# Patient Record
Sex: Female | Born: 1955 | Race: White | Hispanic: No | Marital: Married | Smoking: Former smoker
Health system: Southern US, Community
[De-identification: ages and names within clinical notes are randomized; demographics above are authoritative.]

## PROBLEM LIST (undated history)

## (undated) DIAGNOSIS — Z22322 Carrier or suspected carrier of Methicillin resistant Staphylococcus aureus: Secondary | ICD-10-CM

## (undated) DIAGNOSIS — Z79811 Long term (current) use of aromatase inhibitors: Secondary | ICD-10-CM

## (undated) DIAGNOSIS — J449 Chronic obstructive pulmonary disease, unspecified: Secondary | ICD-10-CM

## (undated) DIAGNOSIS — C7951 Secondary malignant neoplasm of bone: Secondary | ICD-10-CM

## (undated) DIAGNOSIS — Z720 Tobacco use: Secondary | ICD-10-CM

## (undated) DIAGNOSIS — Z9221 Personal history of antineoplastic chemotherapy: Secondary | ICD-10-CM

## (undated) DIAGNOSIS — N6452 Nipple discharge: Secondary | ICD-10-CM

## (undated) DIAGNOSIS — Z923 Personal history of irradiation: Secondary | ICD-10-CM

## (undated) DIAGNOSIS — C50919 Malignant neoplasm of unspecified site of unspecified female breast: Secondary | ICD-10-CM

## (undated) DIAGNOSIS — Z803 Family history of malignant neoplasm of breast: Secondary | ICD-10-CM

## (undated) DIAGNOSIS — G629 Polyneuropathy, unspecified: Secondary | ICD-10-CM

## (undated) DIAGNOSIS — E785 Hyperlipidemia, unspecified: Secondary | ICD-10-CM

## (undated) DIAGNOSIS — Z972 Presence of dental prosthetic device (complete) (partial): Secondary | ICD-10-CM

## (undated) DIAGNOSIS — F419 Anxiety disorder, unspecified: Secondary | ICD-10-CM

## (undated) DIAGNOSIS — I10 Essential (primary) hypertension: Secondary | ICD-10-CM

## (undated) DIAGNOSIS — C50119 Malignant neoplasm of central portion of unspecified female breast: Secondary | ICD-10-CM

## (undated) HISTORY — DX: Family history of malignant neoplasm of breast: Z80.3

## (undated) HISTORY — DX: Tobacco use: Z72.0

## (undated) HISTORY — DX: Nipple discharge: N64.52

## (undated) HISTORY — DX: Malignant neoplasm of central portion of unspecified female breast: C50.119

## (undated) HISTORY — PX: OTHER SURGICAL HISTORY: SHX169

## (undated) HISTORY — PX: DILATION AND CURETTAGE OF UTERUS: SHX78

## (undated) HISTORY — DX: Secondary malignant neoplasm of bone: C79.51

## (undated) HISTORY — DX: Presence of dental prosthetic device (complete) (partial): Z97.2

## (undated) HISTORY — DX: Carrier or suspected carrier of methicillin resistant Staphylococcus aureus: Z22.322

## (undated) HISTORY — PX: TUBAL LIGATION: SHX77

## (undated) HISTORY — DX: Essential (primary) hypertension: I10

---

## 2010-02-05 HISTORY — PX: BREAST SURGERY: SHX581

## 2010-06-20 ENCOUNTER — Other Ambulatory Visit: Payer: Self-pay | Admitting: Surgery

## 2010-06-20 DIAGNOSIS — N6310 Unspecified lump in the right breast, unspecified quadrant: Secondary | ICD-10-CM

## 2010-06-21 ENCOUNTER — Other Ambulatory Visit: Payer: Self-pay | Admitting: Diagnostic Radiology

## 2010-06-21 ENCOUNTER — Other Ambulatory Visit: Payer: Self-pay | Admitting: Family Medicine

## 2010-06-21 ENCOUNTER — Ambulatory Visit
Admission: RE | Admit: 2010-06-21 | Discharge: 2010-06-21 | Disposition: A | Discharge status: Home / Self Care | Payer: Commercial Managed Care - PPO | Source: Ambulatory Visit | Attending: Surgery | Admitting: Surgery

## 2010-06-21 ENCOUNTER — Other Ambulatory Visit: Payer: Self-pay | Admitting: Surgery

## 2010-06-21 ENCOUNTER — Other Ambulatory Visit (HOSPITAL_COMMUNITY)
Admission: RE | Admit: 2010-06-21 | Discharge: 2010-06-21 | Disposition: A | Discharge status: Home / Self Care | Payer: 59 | Source: Ambulatory Visit | Attending: Diagnostic Radiology | Admitting: Diagnostic Radiology

## 2010-06-21 ENCOUNTER — Other Ambulatory Visit (HOSPITAL_COMMUNITY): Payer: Self-pay | Admitting: Surgery

## 2010-06-21 DIAGNOSIS — C50919 Malignant neoplasm of unspecified site of unspecified female breast: Secondary | ICD-10-CM

## 2010-06-21 DIAGNOSIS — N6310 Unspecified lump in the right breast, unspecified quadrant: Secondary | ICD-10-CM

## 2010-06-21 DIAGNOSIS — C50911 Malignant neoplasm of unspecified site of right female breast: Secondary | ICD-10-CM

## 2010-06-21 DIAGNOSIS — N63 Unspecified lump in unspecified breast: Secondary | ICD-10-CM | POA: Insufficient documentation

## 2010-06-21 DIAGNOSIS — C50119 Malignant neoplasm of central portion of unspecified female breast: Secondary | ICD-10-CM

## 2010-06-21 HISTORY — DX: Malignant neoplasm of central portion of unspecified female breast: C50.119

## 2010-06-22 ENCOUNTER — Other Ambulatory Visit: Payer: Self-pay | Admitting: Family Medicine

## 2010-06-26 ENCOUNTER — Ambulatory Visit (HOSPITAL_COMMUNITY)
Admission: RE | Admit: 2010-06-26 | Discharge: 2010-06-26 | Disposition: A | Discharge status: Home / Self Care | Payer: Commercial Managed Care - PPO | Source: Ambulatory Visit | Attending: Surgery | Admitting: Surgery

## 2010-06-26 DIAGNOSIS — C773 Secondary and unspecified malignant neoplasm of axilla and upper limb lymph nodes: Secondary | ICD-10-CM | POA: Insufficient documentation

## 2010-06-26 DIAGNOSIS — C50911 Malignant neoplasm of unspecified site of right female breast: Secondary | ICD-10-CM

## 2010-06-26 DIAGNOSIS — C50919 Malignant neoplasm of unspecified site of unspecified female breast: Secondary | ICD-10-CM | POA: Insufficient documentation

## 2010-06-26 MED ORDER — GADOBENATE DIMEGLUMINE 529 MG/ML IV SOLN
15.0000 mL | Freq: Once | INTRAVENOUS | Status: AC | PRN
  Administered 2010-06-26: 15 mL via INTRAVENOUS

## 2010-06-27 ENCOUNTER — Encounter (HOSPITAL_COMMUNITY)
Admission: RE | Admit: 2010-06-27 | Discharge: 2010-06-27 | Disposition: A | Discharge status: Home / Self Care | Payer: 59 | Source: Ambulatory Visit | Attending: Surgery | Admitting: Surgery

## 2010-06-27 ENCOUNTER — Encounter (HOSPITAL_COMMUNITY): Payer: Self-pay

## 2010-06-27 DIAGNOSIS — C773 Secondary and unspecified malignant neoplasm of axilla and upper limb lymph nodes: Secondary | ICD-10-CM | POA: Insufficient documentation

## 2010-06-27 DIAGNOSIS — K7689 Other specified diseases of liver: Secondary | ICD-10-CM | POA: Insufficient documentation

## 2010-06-27 DIAGNOSIS — I2510 Atherosclerotic heart disease of native coronary artery without angina pectoris: Secondary | ICD-10-CM | POA: Insufficient documentation

## 2010-06-27 DIAGNOSIS — I722 Aneurysm of renal artery: Secondary | ICD-10-CM | POA: Insufficient documentation

## 2010-06-27 DIAGNOSIS — C50919 Malignant neoplasm of unspecified site of unspecified female breast: Secondary | ICD-10-CM | POA: Insufficient documentation

## 2010-06-27 DIAGNOSIS — C771 Secondary and unspecified malignant neoplasm of intrathoracic lymph nodes: Secondary | ICD-10-CM | POA: Insufficient documentation

## 2010-06-27 HISTORY — DX: Malignant neoplasm of unspecified site of unspecified female breast: C50.919

## 2010-06-27 MED ORDER — FLUDEOXYGLUCOSE F - 18 (FDG) INJECTION
16.6000 | Freq: Once | INTRAVENOUS | Status: AC | PRN
  Administered 2010-06-27: 16.6 via INTRAVENOUS

## 2010-06-30 ENCOUNTER — Other Ambulatory Visit: Payer: Self-pay | Admitting: Oncology

## 2010-06-30 ENCOUNTER — Encounter (HOSPITAL_BASED_OUTPATIENT_CLINIC_OR_DEPARTMENT_OTHER): Payer: Commercial Managed Care - PPO | Admitting: Oncology

## 2010-06-30 ENCOUNTER — Encounter: Payer: Self-pay | Admitting: Oncology

## 2010-06-30 DIAGNOSIS — C50919 Malignant neoplasm of unspecified site of unspecified female breast: Secondary | ICD-10-CM

## 2010-06-30 LAB — CBC WITH DIFFERENTIAL/PLATELET
Basophils Absolute: 0.1 10*3/uL (ref 0.0–0.1)
Eosinophils Absolute: 0.2 10*3/uL (ref 0.0–0.5)
HGB: 13.2 g/dL (ref 11.6–15.9)
LYMPH%: 24.8 % (ref 14.0–49.7)
MCV: 94.5 fL (ref 79.5–101.0)
MONO%: 7.7 % (ref 0.0–14.0)
NEUT#: 5.4 10*3/uL (ref 1.5–6.5)
Platelets: 315 10*3/uL (ref 145–400)
RDW: 13.6 % (ref 11.2–14.5)

## 2010-06-30 LAB — CANCER ANTIGEN 27.29: CA 27.29: 27 U/mL (ref 0–39)

## 2010-06-30 LAB — COMPREHENSIVE METABOLIC PANEL
Albumin: 4.2 g/dL (ref 3.5–5.2)
Alkaline Phosphatase: 75 U/L (ref 39–117)
BUN: 11 mg/dL (ref 6–23)
Glucose, Bld: 139 mg/dL — ABNORMAL HIGH (ref 70–99)
Potassium: 4 mEq/L (ref 3.5–5.3)

## 2010-07-01 DIAGNOSIS — C50919 Malignant neoplasm of unspecified site of unspecified female breast: Secondary | ICD-10-CM

## 2010-07-01 HISTORY — DX: Malignant neoplasm of unspecified site of unspecified female breast: C50.919

## 2010-07-05 ENCOUNTER — Ambulatory Visit (HOSPITAL_COMMUNITY)
Admission: RE | Admit: 2010-07-05 | Discharge: 2010-07-05 | Disposition: A | Discharge status: Home / Self Care | Payer: Commercial Managed Care - PPO | Source: Ambulatory Visit | Attending: Oncology | Admitting: Oncology

## 2010-07-05 ENCOUNTER — Encounter (HOSPITAL_COMMUNITY): Payer: Self-pay

## 2010-07-05 DIAGNOSIS — K7689 Other specified diseases of liver: Secondary | ICD-10-CM | POA: Insufficient documentation

## 2010-07-05 DIAGNOSIS — R599 Enlarged lymph nodes, unspecified: Secondary | ICD-10-CM | POA: Insufficient documentation

## 2010-07-05 DIAGNOSIS — I10 Essential (primary) hypertension: Secondary | ICD-10-CM | POA: Insufficient documentation

## 2010-07-05 DIAGNOSIS — C50919 Malignant neoplasm of unspecified site of unspecified female breast: Secondary | ICD-10-CM | POA: Insufficient documentation

## 2010-07-05 DIAGNOSIS — E785 Hyperlipidemia, unspecified: Secondary | ICD-10-CM | POA: Insufficient documentation

## 2010-07-05 DIAGNOSIS — Z8249 Family history of ischemic heart disease and other diseases of the circulatory system: Secondary | ICD-10-CM | POA: Insufficient documentation

## 2010-07-05 DIAGNOSIS — F17200 Nicotine dependence, unspecified, uncomplicated: Secondary | ICD-10-CM | POA: Insufficient documentation

## 2010-07-05 DIAGNOSIS — Z5111 Encounter for antineoplastic chemotherapy: Secondary | ICD-10-CM

## 2010-07-05 MED ORDER — IOHEXOL 300 MG/ML  SOLN
80.0000 mL | Freq: Once | INTRAMUSCULAR | Status: AC | PRN
  Administered 2010-07-05: 80 mL via INTRAVENOUS

## 2010-07-07 ENCOUNTER — Encounter (HOSPITAL_BASED_OUTPATIENT_CLINIC_OR_DEPARTMENT_OTHER): Payer: 59 | Admitting: Oncology

## 2010-07-07 DIAGNOSIS — C773 Secondary and unspecified malignant neoplasm of axilla and upper limb lymph nodes: Secondary | ICD-10-CM

## 2010-07-07 DIAGNOSIS — C50119 Malignant neoplasm of central portion of unspecified female breast: Secondary | ICD-10-CM

## 2010-07-10 ENCOUNTER — Other Ambulatory Visit: Payer: Self-pay | Admitting: Oncology

## 2010-07-10 DIAGNOSIS — C50919 Malignant neoplasm of unspecified site of unspecified female breast: Secondary | ICD-10-CM

## 2010-07-17 ENCOUNTER — Ambulatory Visit (HOSPITAL_COMMUNITY)
Admission: RE | Admit: 2010-07-17 | Discharge: 2010-07-17 | Disposition: A | Discharge status: Home / Self Care | Payer: Commercial Managed Care - PPO | Source: Ambulatory Visit | Attending: Oncology | Admitting: Oncology

## 2010-07-17 ENCOUNTER — Other Ambulatory Visit: Payer: Self-pay | Admitting: Oncology

## 2010-07-17 DIAGNOSIS — C50919 Malignant neoplasm of unspecified site of unspecified female breast: Secondary | ICD-10-CM | POA: Insufficient documentation

## 2010-07-18 ENCOUNTER — Encounter (HOSPITAL_BASED_OUTPATIENT_CLINIC_OR_DEPARTMENT_OTHER): Payer: 59 | Admitting: Oncology

## 2010-07-18 DIAGNOSIS — C50119 Malignant neoplasm of central portion of unspecified female breast: Secondary | ICD-10-CM

## 2010-07-18 DIAGNOSIS — Z5111 Encounter for antineoplastic chemotherapy: Secondary | ICD-10-CM

## 2010-07-19 ENCOUNTER — Encounter (HOSPITAL_COMMUNITY): Payer: 59 | Attending: Oncology

## 2010-07-19 DIAGNOSIS — Z5189 Encounter for other specified aftercare: Secondary | ICD-10-CM

## 2010-07-19 DIAGNOSIS — C50919 Malignant neoplasm of unspecified site of unspecified female breast: Secondary | ICD-10-CM

## 2010-07-25 ENCOUNTER — Encounter (HOSPITAL_BASED_OUTPATIENT_CLINIC_OR_DEPARTMENT_OTHER): Payer: 59 | Admitting: Oncology

## 2010-07-25 ENCOUNTER — Other Ambulatory Visit: Payer: Self-pay | Admitting: Oncology

## 2010-07-25 DIAGNOSIS — C50119 Malignant neoplasm of central portion of unspecified female breast: Secondary | ICD-10-CM

## 2010-07-25 DIAGNOSIS — C50919 Malignant neoplasm of unspecified site of unspecified female breast: Secondary | ICD-10-CM

## 2010-07-25 DIAGNOSIS — C773 Secondary and unspecified malignant neoplasm of axilla and upper limb lymph nodes: Secondary | ICD-10-CM

## 2010-07-25 LAB — CBC WITH DIFFERENTIAL/PLATELET
Eosinophils Absolute: 0.1 10*3/uL (ref 0.0–0.5)
LYMPH%: 42.6 % (ref 14.0–49.7)
MONO#: 1.8 10*3/uL — ABNORMAL HIGH (ref 0.1–0.9)
NEUT#: 1.4 10*3/uL — ABNORMAL LOW (ref 1.5–6.5)
Platelets: 192 10*3/uL (ref 145–400)
RBC: 4.29 10*6/uL (ref 3.70–5.45)
RDW: 12.7 % (ref 11.2–14.5)
WBC: 5.8 10*3/uL (ref 3.9–10.3)
lymph#: 2.5 10*3/uL (ref 0.9–3.3)
nRBC: 0 % (ref 0–0)

## 2010-08-01 ENCOUNTER — Other Ambulatory Visit: Payer: Self-pay | Admitting: Oncology

## 2010-08-01 ENCOUNTER — Encounter (HOSPITAL_BASED_OUTPATIENT_CLINIC_OR_DEPARTMENT_OTHER): Payer: 59 | Admitting: Oncology

## 2010-08-01 DIAGNOSIS — C50919 Malignant neoplasm of unspecified site of unspecified female breast: Secondary | ICD-10-CM

## 2010-08-01 LAB — CBC WITH DIFFERENTIAL/PLATELET
Basophils Absolute: 0.1 10*3/uL (ref 0.0–0.1)
Eosinophils Absolute: 0 10*3/uL (ref 0.0–0.5)
HCT: 35.3 % (ref 34.8–46.6)
HGB: 11.8 g/dL (ref 11.6–15.9)
MONO#: 0.9 10*3/uL (ref 0.1–0.9)
NEUT#: 7.4 10*3/uL — ABNORMAL HIGH (ref 1.5–6.5)
NEUT%: 68.6 % (ref 38.4–76.8)
RDW: 13.3 % (ref 11.2–14.5)
WBC: 10.8 10*3/uL — ABNORMAL HIGH (ref 3.9–10.3)
lymph#: 2.4 10*3/uL (ref 0.9–3.3)

## 2010-08-08 ENCOUNTER — Other Ambulatory Visit: Payer: Self-pay | Admitting: Oncology

## 2010-08-08 ENCOUNTER — Encounter (HOSPITAL_BASED_OUTPATIENT_CLINIC_OR_DEPARTMENT_OTHER): Payer: 59 | Admitting: Oncology

## 2010-08-08 DIAGNOSIS — Z5111 Encounter for antineoplastic chemotherapy: Secondary | ICD-10-CM

## 2010-08-08 DIAGNOSIS — C50119 Malignant neoplasm of central portion of unspecified female breast: Secondary | ICD-10-CM

## 2010-08-08 DIAGNOSIS — C773 Secondary and unspecified malignant neoplasm of axilla and upper limb lymph nodes: Secondary | ICD-10-CM

## 2010-08-08 LAB — CBC WITH DIFFERENTIAL/PLATELET
Basophils Absolute: 0.1 10*3/uL (ref 0.0–0.1)
Eosinophils Absolute: 0 10*3/uL (ref 0.0–0.5)
HGB: 12.1 g/dL (ref 11.6–15.9)
LYMPH%: 7.6 % — ABNORMAL LOW (ref 14.0–49.7)
MCV: 94 fL (ref 79.5–101.0)
MONO#: 0.4 10*3/uL (ref 0.1–0.9)
MONO%: 2.5 % (ref 0.0–14.0)
NEUT#: 14.1 10*3/uL — ABNORMAL HIGH (ref 1.5–6.5)
Platelets: 482 10*3/uL — ABNORMAL HIGH (ref 145–400)
WBC: 15.8 10*3/uL — ABNORMAL HIGH (ref 3.9–10.3)

## 2010-08-08 LAB — COMPREHENSIVE METABOLIC PANEL
Albumin: 4.2 g/dL (ref 3.5–5.2)
Alkaline Phosphatase: 82 U/L (ref 39–117)
BUN: 14 mg/dL (ref 6–23)
CO2: 24 mEq/L (ref 19–32)
Glucose, Bld: 148 mg/dL — ABNORMAL HIGH (ref 70–99)
Potassium: 4.3 mEq/L (ref 3.5–5.3)
Sodium: 137 mEq/L (ref 135–145)
Total Protein: 6.5 g/dL (ref 6.0–8.3)

## 2010-08-09 ENCOUNTER — Encounter (HOSPITAL_BASED_OUTPATIENT_CLINIC_OR_DEPARTMENT_OTHER): Payer: 59 | Admitting: Oncology

## 2010-08-09 DIAGNOSIS — D702 Other drug-induced agranulocytosis: Secondary | ICD-10-CM

## 2010-08-09 DIAGNOSIS — C773 Secondary and unspecified malignant neoplasm of axilla and upper limb lymph nodes: Secondary | ICD-10-CM

## 2010-08-09 DIAGNOSIS — C50119 Malignant neoplasm of central portion of unspecified female breast: Secondary | ICD-10-CM

## 2010-08-09 DIAGNOSIS — Z5111 Encounter for antineoplastic chemotherapy: Secondary | ICD-10-CM

## 2010-08-15 ENCOUNTER — Encounter (HOSPITAL_BASED_OUTPATIENT_CLINIC_OR_DEPARTMENT_OTHER): Payer: 59 | Admitting: Oncology

## 2010-08-15 ENCOUNTER — Other Ambulatory Visit: Payer: Self-pay | Admitting: Oncology

## 2010-08-15 DIAGNOSIS — C773 Secondary and unspecified malignant neoplasm of axilla and upper limb lymph nodes: Secondary | ICD-10-CM

## 2010-08-15 DIAGNOSIS — C50119 Malignant neoplasm of central portion of unspecified female breast: Secondary | ICD-10-CM

## 2010-08-15 LAB — CBC WITH DIFFERENTIAL/PLATELET
Basophils Absolute: 0 10*3/uL (ref 0.0–0.1)
Eosinophils Absolute: 0.1 10*3/uL (ref 0.0–0.5)
HGB: 11.9 g/dL (ref 11.6–15.9)
MCV: 91.5 fL (ref 79.5–101.0)
MONO#: 1.1 10*3/uL — ABNORMAL HIGH (ref 0.1–0.9)
NEUT#: 1.9 10*3/uL (ref 1.5–6.5)
RBC: 3.9 10*6/uL (ref 3.70–5.45)
RDW: 13.8 % (ref 11.2–14.5)
WBC: 4.6 10*3/uL (ref 3.9–10.3)
lymph#: 1.5 10*3/uL (ref 0.9–3.3)
nRBC: 0 % (ref 0–0)

## 2010-08-29 ENCOUNTER — Other Ambulatory Visit: Payer: Self-pay | Admitting: Oncology

## 2010-08-29 ENCOUNTER — Encounter (HOSPITAL_BASED_OUTPATIENT_CLINIC_OR_DEPARTMENT_OTHER): Payer: 59 | Admitting: Oncology

## 2010-08-29 DIAGNOSIS — C50119 Malignant neoplasm of central portion of unspecified female breast: Secondary | ICD-10-CM

## 2010-08-29 DIAGNOSIS — Z170 Estrogen receptor positive status [ER+]: Secondary | ICD-10-CM

## 2010-08-29 DIAGNOSIS — C50919 Malignant neoplasm of unspecified site of unspecified female breast: Secondary | ICD-10-CM

## 2010-08-29 DIAGNOSIS — Z5112 Encounter for antineoplastic immunotherapy: Secondary | ICD-10-CM

## 2010-08-29 DIAGNOSIS — Z5111 Encounter for antineoplastic chemotherapy: Secondary | ICD-10-CM

## 2010-08-29 LAB — COMPREHENSIVE METABOLIC PANEL
ALT: 31 U/L (ref 0–35)
CO2: 22 mEq/L (ref 19–32)
Calcium: 9.5 mg/dL (ref 8.4–10.5)
Chloride: 102 mEq/L (ref 96–112)
Creatinine, Ser: 0.76 mg/dL (ref 0.50–1.10)
Glucose, Bld: 144 mg/dL — ABNORMAL HIGH (ref 70–99)

## 2010-08-29 LAB — CBC WITH DIFFERENTIAL/PLATELET
BASO%: 0.1 % (ref 0.0–2.0)
Basophils Absolute: 0 10*3/uL (ref 0.0–0.1)
Eosinophils Absolute: 0 10*3/uL (ref 0.0–0.5)
HCT: 33.9 % — ABNORMAL LOW (ref 34.8–46.6)
HGB: 11.3 g/dL — ABNORMAL LOW (ref 11.6–15.9)
LYMPH%: 7.2 % — ABNORMAL LOW (ref 14.0–49.7)
MCHC: 33.3 g/dL (ref 31.5–36.0)
MONO#: 0.4 10*3/uL (ref 0.1–0.9)
NEUT#: 15.4 10*3/uL — ABNORMAL HIGH (ref 1.5–6.5)
NEUT%: 90.1 % — ABNORMAL HIGH (ref 38.4–76.8)
Platelets: 424 10*3/uL — ABNORMAL HIGH (ref 145–400)
WBC: 17.1 10*3/uL — ABNORMAL HIGH (ref 3.9–10.3)
lymph#: 1.2 10*3/uL (ref 0.9–3.3)

## 2010-08-30 ENCOUNTER — Encounter (HOSPITAL_COMMUNITY): Payer: 59 | Attending: Oncology

## 2010-08-30 DIAGNOSIS — Z5189 Encounter for other specified aftercare: Secondary | ICD-10-CM

## 2010-08-30 DIAGNOSIS — C50119 Malignant neoplasm of central portion of unspecified female breast: Secondary | ICD-10-CM

## 2010-08-30 MED ORDER — PEGFILGRASTIM INJECTION 6 MG/0.6ML
SUBCUTANEOUS | Status: AC
  Administered 2010-08-30: 6 mg via SUBCUTANEOUS
  Filled 2010-08-30: qty 0.6

## 2010-08-30 NOTE — Progress Notes (Signed)
Sheila Garrison presents today for injection per MD orders. Neulasta 6mg  administered SQ in right Abdomen. Administration without incident. Patient tolerated well.

## 2010-08-31 ENCOUNTER — Encounter (INDEPENDENT_AMBULATORY_CARE_PROVIDER_SITE_OTHER): Payer: Self-pay | Admitting: Surgery

## 2010-09-01 ENCOUNTER — Ambulatory Visit (HOSPITAL_COMMUNITY)
Admission: RE | Admit: 2010-09-01 | Discharge: 2010-09-01 | Disposition: A | Discharge status: Home / Self Care | Payer: 59 | Source: Ambulatory Visit | Attending: Oncology | Admitting: Oncology

## 2010-09-01 DIAGNOSIS — R599 Enlarged lymph nodes, unspecified: Secondary | ICD-10-CM | POA: Insufficient documentation

## 2010-09-01 DIAGNOSIS — C50919 Malignant neoplasm of unspecified site of unspecified female breast: Secondary | ICD-10-CM | POA: Insufficient documentation

## 2010-09-01 MED ORDER — GADOBENATE DIMEGLUMINE 529 MG/ML IV SOLN
15.0000 mL | Freq: Once | INTRAVENOUS | Status: AC | PRN
  Administered 2010-09-01: 15 mL via INTRAVENOUS

## 2010-09-05 ENCOUNTER — Encounter (HOSPITAL_BASED_OUTPATIENT_CLINIC_OR_DEPARTMENT_OTHER): Payer: 59 | Admitting: Oncology

## 2010-09-05 ENCOUNTER — Other Ambulatory Visit: Payer: Self-pay | Admitting: Oncology

## 2010-09-05 DIAGNOSIS — C50919 Malignant neoplasm of unspecified site of unspecified female breast: Secondary | ICD-10-CM

## 2010-09-05 DIAGNOSIS — C773 Secondary and unspecified malignant neoplasm of axilla and upper limb lymph nodes: Secondary | ICD-10-CM

## 2010-09-05 DIAGNOSIS — C50119 Malignant neoplasm of central portion of unspecified female breast: Secondary | ICD-10-CM

## 2010-09-05 LAB — CBC WITH DIFFERENTIAL/PLATELET
BASO%: 0.5 % (ref 0.0–2.0)
EOS%: 0.4 % (ref 0.0–7.0)
HCT: 34.5 % — ABNORMAL LOW (ref 34.8–46.6)
LYMPH%: 24.7 % (ref 14.0–49.7)
MCH: 30.9 pg (ref 25.1–34.0)
MCHC: 33 g/dL (ref 31.5–36.0)
MCV: 93.5 fL (ref 79.5–101.0)
MONO#: 4.1 10*3/uL — ABNORMAL HIGH (ref 0.1–0.9)
MONO%: 26.8 % — ABNORMAL HIGH (ref 0.0–14.0)
NEUT%: 47.6 % (ref 38.4–76.8)
Platelets: 285 10*3/uL (ref 145–400)
RBC: 3.69 10*6/uL — ABNORMAL LOW (ref 3.70–5.45)
WBC: 15.4 10*3/uL — ABNORMAL HIGH (ref 3.9–10.3)
nRBC: 0 % (ref 0–0)

## 2010-09-06 ENCOUNTER — Encounter (INDEPENDENT_AMBULATORY_CARE_PROVIDER_SITE_OTHER): Payer: Self-pay | Admitting: Surgery

## 2010-09-06 ENCOUNTER — Ambulatory Visit (INDEPENDENT_AMBULATORY_CARE_PROVIDER_SITE_OTHER): Payer: Commercial Managed Care - PPO | Admitting: Surgery

## 2010-09-06 VITALS — BP 96/62 | HR 64 | Temp 96.8°F

## 2010-09-06 DIAGNOSIS — E785 Hyperlipidemia, unspecified: Secondary | ICD-10-CM | POA: Insufficient documentation

## 2010-09-06 DIAGNOSIS — I10 Essential (primary) hypertension: Secondary | ICD-10-CM | POA: Insufficient documentation

## 2010-09-06 DIAGNOSIS — C50119 Malignant neoplasm of central portion of unspecified female breast: Secondary | ICD-10-CM

## 2010-09-06 NOTE — Patient Instructions (Signed)
Come to see me about two weeks before your last chemo and we can talk about dates for surgery. Talk to Dr Henreitta Cea about an opinion about the left breast, but I would think its OK to leave it allow

## 2010-09-06 NOTE — Progress Notes (Signed)
This patient returns for followup halfway through her neoadjuvant chemotherapy for her stage IIIc right breast cancer. When she presented if this was an ulcerating bleeding mass.  She seems to be tolerating her chemotherapy well. She has had a port placed for chemotherapy. She has noticed that the wound has healed somewhat. 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  She comes in for me to evaluate prior to making a decision about surgery.  Past history, family history, review of systems, are all noted in her chart and not redictated here.  Physical exam: Gen.: Patient is alert and appears to be doing well. Breasts: Right breast continues to have a large mass in the central to the upper outer portion. However it appears to be healing and significantly smaller than her first visit here. There is no evidence of active infection.  Data reviewed: I reviewed all the notes from Dr. Oris Oats office as well as our old paper chart. I also looked over the recent MRI which does show improvement  Impression: Stage III right breast cancer receptor positive undergoing neoadjuvant chemotherapy with improvement  Plan: I will plan to see her back in about two weeks before her last chemotherapy. She understands that she will need a modified radical mastectomy for surgery. She also knows that she will need postoperative radiation therapy.  She inquired about the potential of doing a prophylactic left mastectomy but I discouraged that. I did suggest that she discuss that with Dr.Magrinat.

## 2010-09-19 ENCOUNTER — Encounter (HOSPITAL_BASED_OUTPATIENT_CLINIC_OR_DEPARTMENT_OTHER): Payer: 59 | Admitting: Oncology

## 2010-09-19 ENCOUNTER — Other Ambulatory Visit: Payer: Self-pay | Admitting: Oncology

## 2010-09-19 DIAGNOSIS — D702 Other drug-induced agranulocytosis: Secondary | ICD-10-CM

## 2010-09-19 DIAGNOSIS — C50119 Malignant neoplasm of central portion of unspecified female breast: Secondary | ICD-10-CM

## 2010-09-19 DIAGNOSIS — C773 Secondary and unspecified malignant neoplasm of axilla and upper limb lymph nodes: Secondary | ICD-10-CM

## 2010-09-19 DIAGNOSIS — C50919 Malignant neoplasm of unspecified site of unspecified female breast: Secondary | ICD-10-CM

## 2010-09-19 DIAGNOSIS — Z5111 Encounter for antineoplastic chemotherapy: Secondary | ICD-10-CM

## 2010-09-19 LAB — CBC WITH DIFFERENTIAL/PLATELET
BASO%: 0.1 % (ref 0.0–2.0)
Basophils Absolute: 0 10*3/uL (ref 0.0–0.1)
EOS%: 0 % (ref 0.0–7.0)
HGB: 11.8 g/dL (ref 11.6–15.9)
MCH: 31.9 pg (ref 25.1–34.0)
MCHC: 32.9 g/dL (ref 31.5–36.0)
MCV: 97 fL (ref 79.5–101.0)
MONO%: 3 % (ref 0.0–14.0)
RDW: 17.9 % — ABNORMAL HIGH (ref 11.2–14.5)
lymph#: 0.9 10*3/uL (ref 0.9–3.3)

## 2010-09-20 ENCOUNTER — Encounter (HOSPITAL_COMMUNITY): Payer: 59 | Attending: Oncology

## 2010-09-20 DIAGNOSIS — C50119 Malignant neoplasm of central portion of unspecified female breast: Secondary | ICD-10-CM

## 2010-09-20 DIAGNOSIS — D701 Agranulocytosis secondary to cancer chemotherapy: Secondary | ICD-10-CM

## 2010-09-20 DIAGNOSIS — D702 Other drug-induced agranulocytosis: Secondary | ICD-10-CM | POA: Insufficient documentation

## 2010-09-20 MED ORDER — PEGFILGRASTIM INJECTION 6 MG/0.6ML
SUBCUTANEOUS | Status: AC
  Administered 2010-09-20: 6 mg via SUBCUTANEOUS
  Filled 2010-09-20: qty 0.6

## 2010-09-20 MED ORDER — PEGFILGRASTIM INJECTION 6 MG/0.6ML
6.0000 mg | Freq: Once | SUBCUTANEOUS | Status: AC
  Administered 2010-09-20: 6 mg via SUBCUTANEOUS

## 2010-09-20 NOTE — Progress Notes (Signed)
Sheila Garrison presents today for injection per MD orders. Neulasta 6mg  administered SQ in left Abdomen. Administration without incident. Patient tolerated well.

## 2010-09-26 ENCOUNTER — Encounter (HOSPITAL_BASED_OUTPATIENT_CLINIC_OR_DEPARTMENT_OTHER): Payer: 59 | Admitting: Oncology

## 2010-09-26 ENCOUNTER — Other Ambulatory Visit: Payer: Self-pay | Admitting: Oncology

## 2010-09-26 DIAGNOSIS — C50119 Malignant neoplasm of central portion of unspecified female breast: Secondary | ICD-10-CM

## 2010-09-26 DIAGNOSIS — C773 Secondary and unspecified malignant neoplasm of axilla and upper limb lymph nodes: Secondary | ICD-10-CM

## 2010-09-26 DIAGNOSIS — C50919 Malignant neoplasm of unspecified site of unspecified female breast: Secondary | ICD-10-CM

## 2010-09-26 DIAGNOSIS — Z5111 Encounter for antineoplastic chemotherapy: Secondary | ICD-10-CM

## 2010-09-26 DIAGNOSIS — D702 Other drug-induced agranulocytosis: Secondary | ICD-10-CM

## 2010-09-26 LAB — COMPREHENSIVE METABOLIC PANEL
AST: 31 U/L (ref 0–37)
Albumin: 3.5 g/dL (ref 3.5–5.2)
BUN: 10 mg/dL (ref 6–23)
CO2: 31 mEq/L (ref 19–32)
Calcium: 9.8 mg/dL (ref 8.4–10.5)
Chloride: 95 mEq/L — ABNORMAL LOW (ref 96–112)
Potassium: 3.1 mEq/L — ABNORMAL LOW (ref 3.5–5.3)

## 2010-09-26 LAB — MANUAL DIFFERENTIAL
ALC: 2 10*3/uL (ref 0.9–3.3)
ANC (CHCC manual diff): 2.1 10*3/uL (ref 1.5–6.5)
Basophil: 0 % (ref 0–2)
Blasts: 0 % (ref 0–0)
Metamyelocytes: 0 % (ref 0–0)
Myelocytes: 0 % (ref 0–0)
PROMYELO: 0 % (ref 0–0)
Variant Lymph: 0 % (ref 0–0)

## 2010-09-26 LAB — CBC WITH DIFFERENTIAL/PLATELET
HGB: 10.9 g/dL — ABNORMAL LOW (ref 11.6–15.9)
MCH: 32.7 pg (ref 25.1–34.0)
MCHC: 33.8 g/dL (ref 31.5–36.0)
RDW: 18.5 % — ABNORMAL HIGH (ref 11.2–14.5)

## 2010-10-10 ENCOUNTER — Other Ambulatory Visit: Payer: Self-pay | Admitting: Oncology

## 2010-10-10 ENCOUNTER — Encounter (HOSPITAL_BASED_OUTPATIENT_CLINIC_OR_DEPARTMENT_OTHER): Payer: 59 | Admitting: Oncology

## 2010-10-10 ENCOUNTER — Ambulatory Visit (HOSPITAL_COMMUNITY)
Admission: RE | Admit: 2010-10-10 | Discharge: 2010-10-10 | Disposition: A | Discharge status: Home / Self Care | Payer: 59 | Source: Ambulatory Visit | Attending: Oncology | Admitting: Oncology

## 2010-10-10 DIAGNOSIS — C50919 Malignant neoplasm of unspecified site of unspecified female breast: Secondary | ICD-10-CM | POA: Insufficient documentation

## 2010-10-10 DIAGNOSIS — Z5112 Encounter for antineoplastic immunotherapy: Secondary | ICD-10-CM

## 2010-10-10 DIAGNOSIS — C50119 Malignant neoplasm of central portion of unspecified female breast: Secondary | ICD-10-CM

## 2010-10-10 DIAGNOSIS — M7989 Other specified soft tissue disorders: Secondary | ICD-10-CM

## 2010-10-10 DIAGNOSIS — I10 Essential (primary) hypertension: Secondary | ICD-10-CM | POA: Insufficient documentation

## 2010-10-10 DIAGNOSIS — C773 Secondary and unspecified malignant neoplasm of axilla and upper limb lymph nodes: Secondary | ICD-10-CM

## 2010-10-10 DIAGNOSIS — D702 Other drug-induced agranulocytosis: Secondary | ICD-10-CM

## 2010-10-10 DIAGNOSIS — Z5111 Encounter for antineoplastic chemotherapy: Secondary | ICD-10-CM

## 2010-10-10 LAB — CBC WITH DIFFERENTIAL/PLATELET
BASO%: 0.1 % (ref 0.0–2.0)
Basophils Absolute: 0 10*3/uL (ref 0.0–0.1)
EOS%: 0 % (ref 0.0–7.0)
HGB: 10.9 g/dL — ABNORMAL LOW (ref 11.6–15.9)
MCH: 32.3 pg (ref 25.1–34.0)
MCHC: 32.7 g/dL (ref 31.5–36.0)
MONO#: 0.5 10*3/uL (ref 0.1–0.9)
RDW: 19.4 % — ABNORMAL HIGH (ref 11.2–14.5)
WBC: 14.6 10*3/uL — ABNORMAL HIGH (ref 3.9–10.3)
lymph#: 0.9 10*3/uL (ref 0.9–3.3)

## 2010-10-10 LAB — COMPREHENSIVE METABOLIC PANEL
AST: 25 U/L (ref 0–37)
Albumin: 3.6 g/dL (ref 3.5–5.2)
Alkaline Phosphatase: 83 U/L (ref 39–117)
Potassium: 4 mEq/L (ref 3.5–5.3)
Sodium: 135 mEq/L (ref 135–145)
Total Protein: 6.7 g/dL (ref 6.0–8.3)

## 2010-10-11 ENCOUNTER — Ambulatory Visit (HOSPITAL_COMMUNITY)
Admission: RE | Admit: 2010-10-11 | Discharge: 2010-10-11 | Disposition: A | Discharge status: Home / Self Care | Payer: 59 | Source: Ambulatory Visit | Attending: Oncology | Admitting: Oncology

## 2010-10-11 ENCOUNTER — Encounter (HOSPITAL_BASED_OUTPATIENT_CLINIC_OR_DEPARTMENT_OTHER): Payer: 59 | Admitting: Oncology

## 2010-10-11 ENCOUNTER — Ambulatory Visit (HOSPITAL_COMMUNITY): Payer: Self-pay

## 2010-10-11 DIAGNOSIS — C50119 Malignant neoplasm of central portion of unspecified female breast: Secondary | ICD-10-CM

## 2010-10-11 DIAGNOSIS — C50919 Malignant neoplasm of unspecified site of unspecified female breast: Secondary | ICD-10-CM | POA: Insufficient documentation

## 2010-10-11 DIAGNOSIS — E785 Hyperlipidemia, unspecified: Secondary | ICD-10-CM | POA: Insufficient documentation

## 2010-10-11 DIAGNOSIS — Z09 Encounter for follow-up examination after completed treatment for conditions other than malignant neoplasm: Secondary | ICD-10-CM

## 2010-10-11 DIAGNOSIS — Z5189 Encounter for other specified aftercare: Secondary | ICD-10-CM

## 2010-10-17 ENCOUNTER — Encounter (HOSPITAL_BASED_OUTPATIENT_CLINIC_OR_DEPARTMENT_OTHER): Payer: 59 | Admitting: Oncology

## 2010-10-17 ENCOUNTER — Other Ambulatory Visit: Payer: Self-pay | Admitting: Oncology

## 2010-10-17 DIAGNOSIS — C773 Secondary and unspecified malignant neoplasm of axilla and upper limb lymph nodes: Secondary | ICD-10-CM

## 2010-10-17 DIAGNOSIS — D702 Other drug-induced agranulocytosis: Secondary | ICD-10-CM

## 2010-10-17 DIAGNOSIS — C50119 Malignant neoplasm of central portion of unspecified female breast: Secondary | ICD-10-CM

## 2010-10-17 DIAGNOSIS — Z5111 Encounter for antineoplastic chemotherapy: Secondary | ICD-10-CM

## 2010-10-17 LAB — CBC WITH DIFFERENTIAL/PLATELET
EOS%: 0.4 % (ref 0.0–7.0)
MCH: 32.5 pg (ref 25.1–34.0)
MCHC: 33.2 g/dL (ref 31.5–36.0)
MCV: 97.8 fL (ref 79.5–101.0)
MONO%: 27.3 % — ABNORMAL HIGH (ref 0.0–14.0)
RBC: 3.2 10*6/uL — ABNORMAL LOW (ref 3.70–5.45)
RDW: 18.5 % — ABNORMAL HIGH (ref 11.2–14.5)
nRBC: 0 % (ref 0–0)

## 2010-10-22 ENCOUNTER — Encounter (INDEPENDENT_AMBULATORY_CARE_PROVIDER_SITE_OTHER): Payer: Self-pay | Admitting: Surgery

## 2010-10-24 ENCOUNTER — Encounter (INDEPENDENT_AMBULATORY_CARE_PROVIDER_SITE_OTHER): Payer: Self-pay | Admitting: Surgery

## 2010-10-24 ENCOUNTER — Ambulatory Visit (INDEPENDENT_AMBULATORY_CARE_PROVIDER_SITE_OTHER): Payer: Commercial Managed Care - PPO | Admitting: Surgery

## 2010-10-24 VITALS — BP 126/74 | HR 84 | Temp 97.6°F | Resp 20 | Ht 65.5 in | Wt 173.2 lb

## 2010-10-24 DIAGNOSIS — C50119 Malignant neoplasm of central portion of unspecified female breast: Secondary | ICD-10-CM

## 2010-10-24 NOTE — Patient Instructions (Signed)
We will schedule surgery for a right mastectomy and removal of the lymph nodes from the right armpit area. You will be admitted the day of surgery. He'll need to spend one or two nights in the hospital. He will have to drain tubes will have to be taken care of at home and we will give instructions before discharge on that.  I don't think you need to have an MRI again.  Call the office if you have any questions about your surgery.

## 2010-10-24 NOTE — Progress Notes (Signed)
CC: Breast cancer followup after neoadjuvant chemotherapy HPI: This patient was diagnosed several months ago with a large fungating right breast cancer which was bleeding at the time we first saw her. She has been undergoing neoadjuvant chemotherapy and has one treatment left. She has noted a remarkable improvement in the breast mass. It has essentially healed. Her original evaluation for metastatic disease showed extensive axillary and subpectoral lymphatic involvement. I don't believe there is any distant disease identified.   ROS: No changes since her original visit  MEDS: Current Outpatient Prescriptions  Medication Sig Dispense Refill  . acetaminophen-codeine (TYLENOL #3) 300-30 MG per tablet Take 1 tablet by mouth every 4 (four) hours as needed.        . cycloSPORINE (RESTASIS) 0.05 % ophthalmic emulsion 1 drop as needed.        . Dexamethasone (DECADRON PO) Take by mouth daily. During chemo        . diphenhydrAMINE (BENADRYL) 25 MG tablet Take 25 mg by mouth every 6 (six) hours as needed.        . fexofenadine (ALLEGRA) 180 MG tablet Take 180 mg by mouth daily.        . Lansoprazole (PREVACID PO) Take by mouth daily.        Marland Kitchen lidocaine-prilocaine (EMLA) cream Apply topically as needed.        Marland Kitchen LORazepam (ATIVAN) 0.5 MG tablet Take 0.5 mg by mouth as needed.        Marland Kitchen losartan-hydrochlorothiazide (HYZAAR) 100-12.5 MG per tablet Take 1 tablet by mouth daily.        . Multiple Vitamin (MULTIVITAMIN) capsule Take 1 capsule by mouth daily.        . naproxen sodium (ANAPROX) 220 MG tablet Take 220 mg by mouth as needed.        . Ondansetron HCl (ZOFRAN PO) Take by mouth daily. Before and during chemo       . potassium chloride SA (K-DUR,KLOR-CON) 20 MEQ tablet Take 20 mEq by mouth daily.        Marland Kitchen Prochlorperazine Maleate (COMPAZINE PO) Take by mouth daily. Before and during chemo        . rosuvastatin (CRESTOR) 10 MG tablet Take 10 mg by mouth daily.        Marland Kitchen  sulfamethoxazole-trimethoprim (BACTRIM DS) 800-160 MG per tablet Take 1 tablet by mouth once.           ALLERGIES: No Known Allergies    PE GENERAL:  The patient is alert, oriented, and generally healthy-appearing, NAD. Mood and affect are normal.  HEENT:  The head is normocephalic, the eyes nonicteric, the pupils were round regular and equal. EOMs are normal. Pharynx normal. Dentition good.  NECK:  The neck is supple and there are no masses or thyromegaly.  LUNGS: Normal respirations and clear to auscultation.  HEART: Regular rhythm, with no murmurs rubs or gallops. Pulses are intact carotid dorsalis pedis and posterior tibial. No significant varicosities are noted.  BREASTS:  Left breast remains normal to inspection and palpation. The right breast has shown resolution of most of the skin changes associated with her cancer. However there is still a superficial open area in the upper outer quadrant Marcaine the original site of the fungating mass.  Right breast is somewhat firm throughout. There is no obvious tumor mass any more.  LYMPHATICS: I do not appreciate any axillary or supraclavicular adenopathy on either side.  ABDOMEN: Soft, flat, and nontender. No masses or organomegaly is noted. No  hernias are noted. Bowel sounds are normal.  EXTREMITIES:  Good range of motion, no edema.   Data Reviewed I have reviewed the notes from the oncology office as well as reviewed her scans and last MRI  Assessment Locally advanced right breast cancer now completing neoadjuvant chemotherapy  Plan I think she will need a right modified radical mastectomy. I have had a long discussion with her and her friend. We've discussed the surgery and anticipated postoperative care. I told her that she needs to do the surgery about three weeks after her last chemotherapy. She needs to leave the port in place for now.  I think all questions have been answered. She would like to proceed to  scheduling surgery.

## 2010-10-31 ENCOUNTER — Encounter (HOSPITAL_BASED_OUTPATIENT_CLINIC_OR_DEPARTMENT_OTHER): Payer: 59 | Admitting: Oncology

## 2010-10-31 ENCOUNTER — Other Ambulatory Visit: Payer: Self-pay | Admitting: Oncology

## 2010-10-31 DIAGNOSIS — C50919 Malignant neoplasm of unspecified site of unspecified female breast: Secondary | ICD-10-CM

## 2010-10-31 DIAGNOSIS — C50119 Malignant neoplasm of central portion of unspecified female breast: Secondary | ICD-10-CM

## 2010-10-31 DIAGNOSIS — Z5111 Encounter for antineoplastic chemotherapy: Secondary | ICD-10-CM

## 2010-10-31 DIAGNOSIS — C773 Secondary and unspecified malignant neoplasm of axilla and upper limb lymph nodes: Secondary | ICD-10-CM

## 2010-10-31 DIAGNOSIS — Z5112 Encounter for antineoplastic immunotherapy: Secondary | ICD-10-CM

## 2010-10-31 LAB — COMPREHENSIVE METABOLIC PANEL
ALT: 37 U/L — ABNORMAL HIGH (ref 0–35)
AST: 24 U/L (ref 0–37)
Albumin: 3.9 g/dL (ref 3.5–5.2)
Calcium: 10 mg/dL (ref 8.4–10.5)
Chloride: 98 mEq/L (ref 96–112)
Potassium: 3.8 mEq/L (ref 3.5–5.3)

## 2010-10-31 LAB — CBC WITH DIFFERENTIAL/PLATELET
BASO%: 0.3 % (ref 0.0–2.0)
Basophils Absolute: 0 10*3/uL (ref 0.0–0.1)
EOS%: 0 % (ref 0.0–7.0)
HGB: 11.3 g/dL — ABNORMAL LOW (ref 11.6–15.9)
MCH: 34 pg (ref 25.1–34.0)
RDW: 21.8 % — ABNORMAL HIGH (ref 11.2–14.5)
WBC: 13.8 10*3/uL — ABNORMAL HIGH (ref 3.9–10.3)
lymph#: 0.8 10*3/uL — ABNORMAL LOW (ref 0.9–3.3)

## 2010-11-01 ENCOUNTER — Encounter (HOSPITAL_COMMUNITY): Payer: 59 | Attending: Oncology

## 2010-11-01 VITALS — BP 103/58 | HR 66

## 2010-11-01 DIAGNOSIS — C50119 Malignant neoplasm of central portion of unspecified female breast: Secondary | ICD-10-CM | POA: Insufficient documentation

## 2010-11-01 MED ORDER — PEGFILGRASTIM INJECTION 6 MG/0.6ML
SUBCUTANEOUS | Status: AC
  Administered 2010-11-01: 6 mg via SUBCUTANEOUS
  Filled 2010-11-01: qty 0.6

## 2010-11-01 MED ORDER — PEGFILGRASTIM INJECTION 6 MG/0.6ML
6.0000 mg | Freq: Once | SUBCUTANEOUS | Status: AC
  Administered 2010-11-01: 6 mg via SUBCUTANEOUS

## 2010-11-01 NOTE — Progress Notes (Signed)
VSS.  Tolerated injection well.

## 2010-11-06 ENCOUNTER — Other Ambulatory Visit (HOSPITAL_COMMUNITY): Payer: 59

## 2010-11-07 ENCOUNTER — Other Ambulatory Visit: Payer: Self-pay | Admitting: Oncology

## 2010-11-07 ENCOUNTER — Encounter (HOSPITAL_BASED_OUTPATIENT_CLINIC_OR_DEPARTMENT_OTHER): Payer: 59 | Admitting: Oncology

## 2010-11-07 DIAGNOSIS — C50119 Malignant neoplasm of central portion of unspecified female breast: Secondary | ICD-10-CM

## 2010-11-07 DIAGNOSIS — Z5111 Encounter for antineoplastic chemotherapy: Secondary | ICD-10-CM

## 2010-11-07 DIAGNOSIS — C773 Secondary and unspecified malignant neoplasm of axilla and upper limb lymph nodes: Secondary | ICD-10-CM

## 2010-11-07 DIAGNOSIS — D702 Other drug-induced agranulocytosis: Secondary | ICD-10-CM

## 2010-11-07 LAB — CBC WITH DIFFERENTIAL/PLATELET
BASO%: 0.2 % (ref 0.0–2.0)
EOS%: 0.2 % (ref 0.0–7.0)
MCH: 32.6 pg (ref 25.1–34.0)
MCHC: 32.5 g/dL (ref 31.5–36.0)
RBC: 3.1 10*6/uL — ABNORMAL LOW (ref 3.70–5.45)
RDW: 18.3 % — ABNORMAL HIGH (ref 11.2–14.5)
lymph#: 1.6 10*3/uL (ref 0.9–3.3)

## 2010-11-13 ENCOUNTER — Ambulatory Visit (HOSPITAL_COMMUNITY): Payer: 59 | Admitting: Oncology

## 2010-11-16 ENCOUNTER — Encounter (HOSPITAL_COMMUNITY): Payer: 59 | Attending: Oncology | Admitting: Oncology

## 2010-11-16 VITALS — BP 117/74 | HR 87 | Temp 97.7°F | Ht 64.75 in | Wt 173.8 lb

## 2010-11-16 DIAGNOSIS — C50119 Malignant neoplasm of central portion of unspecified female breast: Secondary | ICD-10-CM | POA: Insufficient documentation

## 2010-11-16 NOTE — Patient Instructions (Signed)
[facility]  Discharge Instructions  RECOMMENDATIONS MADE BY THE CONSULTANT AND ANY TEST RESULTS WILL BE SENT TO YOUR REFERRING DOCTOR.   EXAM FINDINGS BY MD TODAY AND SIGNS AND SYMPTOMS TO REPORT TO CLINIC OR PRIMARY MD:   To start Herceptin on November 6th at 10:15  We will draw a CBC diff/CMET before your Herceptin  To see Dr. Mariel Sleet November 6th @ 12:00    I acknowledge that I have been informed and understand all the instructions given to me and received a copy. I do not have any more questions at this time, but understand that I may call the Specialty Clinic at [facility] at [phone] during business hours should I have any further questions or need assistance in obtaining follow-up care.    __________________________________________  _____________  __________ Signature of Patient or Authorized Representative            Date                   Time    __________________________________________ Nurse's Signature

## 2010-11-16 NOTE — Progress Notes (Signed)
[facility] NEW PATIENT EVALUATION   Name: Sheila Garrison Date: 11/16/2010 MRN: [id number] DOB: Jul 18, 1955    CC: Monica Becton, MD, MD  Monica Becton, MD   DIAGNOSIS: The encounter diagnosis was Stage III (T4N2) R Br cancer .   HISTORY OF PRESENT ILLNESS:Sheila Garrison is a 55 y.o. female who is a triple positive breast cancer transfer from Dr. Ruthann Cancer. The patient is status post 6 cycles of carboplatin, docetaxel, and trastuzumab. The patient reports that she is fortunate because she completed all cycles of chemotherapy without any complications. She is certainly grateful for that. The reason she is transferring her completion of antibiotic therapy to the [facility] cancer clinic is due to the fact that she lives close by and transportation is much efficient to come to [employer] versus traveling to [place]. She reports that she will be undergoing radiation therapy following surgery in [facility].  The patient reports that she is scheduled for mastectomy under Dr. Barbaraann Share care on October 17 or 18th. According to conversation between the patient and Dr. Darnelle Catalan, the patient's next cycle of Herceptin therapy, which was scheduled for October 16, will be held to prevent any delay in surgery. As result we will follow these guidelines. The patient understands that when we repeat initiate Herceptin therapy we will begin with a loading dose of 8 mg per kilogram and then her dose will change to 6 mg per kilogram thereafter due to a significant time lapse between cycles.    FAMILY HISTORY: family history includes COPD in her brother and mother; Diabetes in her father; and Hypertension in her father.   PAST MEDICAL HISTORY:  has a past medical history of Hypertension; Nipple discharge; MRSA (methicillin resistant staph aureus) culture positive; Wears dentures; Breast ca (07/01/2010); and Family history of breast cancer.       CURRENT  MEDICATIONS: Ms. Finan does not currently have medications on file.   SOCIAL HISTORY:  reports that she quit smoking about 8 months ago. She does not have any smokeless tobacco history on file. She reports that she does not drink alcohol or use illicit drugs.     ALLERGIES: Review of patient's allergies indicates no known allergies.   RADIOGRAPHY:  09/04/10  *RADIOLOGY REPORT*  Clinical Data: Follow-up neoadjuvant therapy for biopsy proven  right breast cancer and right axillary nodal disease. Probable  internal mammary show and right supraclavicular adenopathy on PET  CT.  BILATERAL BREAST MRI WITH AND WITHOUT CONTRAST  Technique: Multiplanar, multisequence MR images of both breasts  were obtained prior to and following the intravenous administration  of 15ml of Multihance. Three dimensional images were evaluated at  the independent DynaCad workstation.  Comparison: Bilateral breast MRI 06/26/2010 and bilateral  mammogram 06/21/2010. PET CT 06/27/2010.  Findings: The irregular right subareolar breast mass with extension  to the nipple/areolar complex and circumareolar skin has decreased  in size and degree of enhancement since the prior breast MRI of May  2012, consistent with positive response to neoadjuvant therapy.  Currently, measurements of the mass are approximately 6.2 x 2.8 x  5.1 cm (transverse x AP x craniocaudal). Previously on 06/26/2010,  measurements were approximately 8.8 x 3.7 x 7.3 cm. The  enhancement remains predominately peripheral. Skin thickening and  enhancement have significantly decreased.  There are no suspicious areas of enhancement in the left breast. A  port a Port-A-Cath is noted in the upper inner left breast.  Right axillary lymphadenopathy has significantly decreased.  On the  current study, the largest right axillary lymph node is a level I  node that measures 1.3 x 0.7 cm. (On the previous breast MRI, the  largest level I axillary lymph node  on the right was 1.9 x 1.7 cm).  The previously visualized prominent internal mammary chain lymph  node, as described on prior PET CT of May 2012, appears  significantly decreased/is barely visible.  There is no left axillary lymphadenopathy.  IMPRESSION:  1. Favorable response to neoadjuvant therapy. Decreased size and  enhancment of the subareolar right breast mass with  nipple/areola/skin involvement. Decreased size of level I and level  II right axillary and right internal mammary chain lymph nodes.  2. No MRI evidence of malignancy in the left breast.  THREE-DIMENSIONAL MR IMAGE RENDERING ON INDEPENDENT WORKSTATION:  Three-dimensional MR images were rendered by post-processing of the  original MR data on an independent workstation. The three-  dimensional MR images were interpreted, and findings were reported  in the accompanying complete MRI report for this study.  BI-RADS CATEGORY 6: Known biopsy-proven malignancy - appropriate  action should be taken.  Original Report Authenticated By: Britta Mccreedy, M.D.     REVIEW OF SYSTEMS: Patient reports no health concerns.   PHYSICAL EXAM:  height is 5' 4.75" (1.645 m) and weight is 173 lb 12.8 oz (78.835 kg). Her oral temperature is 97.7 F (36.5 C). Her blood pressure is 117/74 and her pulse is 87.  General appearance: alert, cooperative, appears stated age and no distress Head: Normocephalic, without obvious abnormality, atraumatic Neck: no adenopathy and supple, symmetrical, trachea midline Resp: clear to auscultation bilaterally Cardio: regular rate and rhythm, S1, S2 normal, no murmur, click, rub or gallop GI: soft, non-tender; bowel sounds normal; no masses,  no organomegaly Extremities: extremities normal, atraumatic, no cyanosis or edema Neurologic: Grossly normal Breast: Right breast reveals a large erythematous  area encompassing the nipple-areola complex.  This is healing nicely without any signs of infection.  She has it  cleanly dressed.  Left breast does not reveal any abnormalities.     IMPRESSION: This is a 55 year old female from [place] who presented with an alterative right breast mass, clinically T4 N2 (stage IIIc) breast cancer with evidence of local or regional spread including internal mammary and supraclavicular lymph node by PET scan. No evidence of distant metastatic disease. Tumor was triple positive. She is now status post 6 cycles of carboplatin/docetaxel/Herceptin every 21 days neoadjuvant as of September 25th.  She is now on Herceptin antibody therapy and she will receive this for a total of one year. The patient's last conversation with Dr. Darnelle Catalan resulted in the conclusion of holding her next cycle of Herceptin as to not delay breast surgery. She is scheduled to undergo breast surgery under the care of Dr. Cyndia Bent on October 17 or 18th. She is recovered wonderfully from chemotherapy.  PLAN:  1. We will hold the patient's next cycle of Herceptin therapy. 2. The patient will undergo breast surgical resection on October 17 or 18th under the care of Dr. Cyndia Bent. 3. We will plan to reinitiate Herceptin therapy on November 6.  4. Due to the lapse in antibody therapy, on November 6 the patient will undergo administration of Herceptin at 8 mg per kilogram which is the loading dose. She will then receive Herceptin at 6 mg per kilogram thereafter. 5. The patient is scheduled to undergo a 2-D echocardiogram to evaluate for ejection fraction on 01/15/2010. I have  ordered her next to 2-D echocardiogram was 3 months and 6 months following her December 2-D echocardiogram. The patient will consider having her December 2-D echocardiogram at [facility] instead of [facility] due to transportation issues. For now the 01/16/2011 echocardiogram is scheduled to be performed in [place]. 6. The patient will return for followup on the day of her Herceptin infusion on 12/12/2010.  At that point time we will discuss how her surgical resection went. We will also address any issues at that point time. We of course do not anticipate any. 7. We will redress her healing right breast ulcer. 8. Lab work: CBC diff, CMET on 12/12/10. 9. The patient will return to Dr. Darnelle Catalan in mid November to review pathology from her surgery.   All questions were answered.  The patient knows to call the clinic with any questions or concerns.  Patient and plan discussed with Dr. Mariel Sleet and he is in agreement with the aforementioned.  More than 50% of the time spent with the patient was utilized for counseling.  Eligha Kmetz

## 2010-11-20 ENCOUNTER — Telehealth (INDEPENDENT_AMBULATORY_CARE_PROVIDER_SITE_OTHER): Payer: Self-pay | Admitting: General Surgery

## 2010-11-20 ENCOUNTER — Other Ambulatory Visit (INDEPENDENT_AMBULATORY_CARE_PROVIDER_SITE_OTHER): Payer: Self-pay | Admitting: Surgery

## 2010-11-20 ENCOUNTER — Encounter (HOSPITAL_COMMUNITY): Payer: 59

## 2010-11-20 LAB — CBC
HCT: 34.8 % — ABNORMAL LOW (ref 36.0–46.0)
Hemoglobin: 11.2 g/dL — ABNORMAL LOW (ref 12.0–15.0)
MCHC: 32.2 g/dL (ref 30.0–36.0)
RDW: 19.2 % — ABNORMAL HIGH (ref 11.5–15.5)
WBC: 7.1 10*3/uL (ref 4.0–10.5)

## 2010-11-20 LAB — URINALYSIS, ROUTINE W REFLEX MICROSCOPIC
Glucose, UA: NEGATIVE mg/dL
Hgb urine dipstick: NEGATIVE
Leukocytes, UA: NEGATIVE
Protein, ur: NEGATIVE mg/dL
Specific Gravity, Urine: 1.007 (ref 1.005–1.030)
Urobilinogen, UA: 0.2 mg/dL (ref 0.0–1.0)

## 2010-11-20 LAB — DIFFERENTIAL
Basophils Absolute: 0 10*3/uL (ref 0.0–0.1)
Basophils Relative: 0 % (ref 0–1)
Lymphocytes Relative: 18 % (ref 12–46)
Monocytes Absolute: 1.1 10*3/uL — ABNORMAL HIGH (ref 0.1–1.0)
Neutro Abs: 4.8 10*3/uL (ref 1.7–7.7)

## 2010-11-20 LAB — COMPREHENSIVE METABOLIC PANEL
ALT: 34 U/L (ref 0–35)
AST: 30 U/L (ref 0–37)
Alkaline Phosphatase: 81 U/L (ref 39–117)
Calcium: 9.7 mg/dL (ref 8.4–10.5)
Potassium: 3.9 mEq/L (ref 3.5–5.1)
Sodium: 136 mEq/L (ref 135–145)
Total Protein: 6.5 g/dL (ref 6.0–8.3)

## 2010-11-20 NOTE — Telephone Encounter (Signed)
Labs okay for surgery faxed to pre-op.  

## 2010-11-20 NOTE — Telephone Encounter (Signed)
Message copied by Liliana Cline on Mon Nov 20, 2010 12:13 PM ------      Message from: Currie Paris      Created: Mon Nov 20, 2010 11:45 AM       These labs are OK for surgery

## 2010-11-22 ENCOUNTER — Inpatient Hospital Stay (HOSPITAL_COMMUNITY)
Admission: RE | Admit: 2010-11-22 | Discharge: 2010-11-23 | DRG: 583 | Disposition: A | Discharge status: Home / Self Care | Payer: 59 | Source: Ambulatory Visit | Attending: Surgery | Admitting: Surgery

## 2010-11-22 ENCOUNTER — Other Ambulatory Visit (INDEPENDENT_AMBULATORY_CARE_PROVIDER_SITE_OTHER): Payer: Self-pay | Admitting: Surgery

## 2010-11-22 DIAGNOSIS — I10 Essential (primary) hypertension: Secondary | ICD-10-CM | POA: Diagnosis present

## 2010-11-22 DIAGNOSIS — Z9221 Personal history of antineoplastic chemotherapy: Secondary | ICD-10-CM

## 2010-11-22 DIAGNOSIS — Z01810 Encounter for preprocedural cardiovascular examination: Secondary | ICD-10-CM

## 2010-11-22 DIAGNOSIS — C50919 Malignant neoplasm of unspecified site of unspecified female breast: Secondary | ICD-10-CM

## 2010-11-22 DIAGNOSIS — C50119 Malignant neoplasm of central portion of unspecified female breast: Principal | ICD-10-CM | POA: Diagnosis present

## 2010-11-22 DIAGNOSIS — Z01812 Encounter for preprocedural laboratory examination: Secondary | ICD-10-CM

## 2010-11-28 NOTE — Op Note (Signed)
Sheila Garrison, Sheila Garrison               ACCOUNT NO.:  [id number]  MEDICAL RECORD NO.:  [id number]  LOCATION:  0002                         FACILITY:  [facility]  PHYSICIAN:  Currie Paris, M.D.DATE OF BIRTH:  03-04-1955  DATE OF PROCEDURE: DATE OF DISCHARGE:                              OPERATIVE REPORT   PREOPERATIVE DIAGNOSIS:  Stage III right breast cancer status post neoadjuvant chemotherapy.  POSTOPERATIVE DIAGNOSIS:  Stage III right breast cancer status post neoadjuvant chemotherapy.  PROCEDURE:  Right modified radical mastectomy.  SURGEON:  Currie Paris, MD  ASSISTANT:  Ollen Gross. Vernell Morgans, MD, also Scott Jaclynn Major PA student.  ANESTHESIA:  General endotracheal.  CLINICAL HISTORY:  This is a 55 year old lady who presented several months ago with a fungating right breast cancer.  She has undergone neoadjuvant chemotherapy and has had marked diminishment in the size of the tumor and some healing of the skin.  There is however fairly large abnormality still in the skin, but at this point, we felt we should proceed with mastectomy with node dissection.  She had positive nodes at the time of initial diagnosis.  DESCRIPTION OF PROCEDURE:  I saw the patient in the holding area and reviewed the plans for the procedure, and she understood and agreed, and had no further questions.  I marked the right breast as the operative side.  The patient was taken to the operating room.  After satisfactory general endotracheal anesthesia had been obtained, the right breast was prepped and draped and the time-out was done.  I made an elliptical incision trying to get at least 1 cm beyond the gross margins of the skin tumor above skin on the tumor and biopsies of both the superior and inferior margins for frozens.  I then developed the normal skin flaps to the clavicle, sternum, inframammary fold, and latissimus.  The breast was then removed from medial to lateral taking the  fascia.  When I got to the edge of the pectoralis, I opened the clavipectoral fascia and began to try to identify the axillary vein.  I did identify that, opened some of the tissue anterior to that.  All of this tissue was very fibrotic consistent with chemotherapy change from her involved nodes.  I was able to strip the tissue out, but the long thoracic nerve was identified, the thoracodorsal nerves were tented up into the tissue as I was trying to get out had to be dissected out and we did preserve the nerves.  Once everything was freed up, I detached from its lateral attachments to latissimus and handed the specimen off.  I made several irrigations, made sure everything was dry.  I spent several minutes doing that.  I then checked to make sure both the nerves were functioning, they appeared to be functioning okay.  I then irrigated again.  I put two 19-Blake drains and secured them with 2-0 nylons.  A final irrigation check for hemostasis was made.  The incision was then closed with staples.  Sterile dressings were applied.  The patient tolerated the procedure well.  There were no operative complications.  All counts were correct.     Currie Paris, M.D.  CJS/MEDQ  D:  11/22/2010  T:  11/22/2010  Job:  [id number]  Electronically Signed by Cyndia Bent M.D. on 11/28/2010 10:28:17 AM

## 2010-11-30 ENCOUNTER — Ambulatory Visit (INDEPENDENT_AMBULATORY_CARE_PROVIDER_SITE_OTHER): Payer: Commercial Managed Care - PPO | Admitting: Surgery

## 2010-11-30 ENCOUNTER — Encounter (INDEPENDENT_AMBULATORY_CARE_PROVIDER_SITE_OTHER): Payer: Self-pay | Admitting: Surgery

## 2010-11-30 VITALS — BP 131/80 | HR 63 | Temp 98.0°F | Resp 12 | Ht 64.0 in | Wt 165.6 lb

## 2010-11-30 DIAGNOSIS — Z09 Encounter for follow-up examination after completed treatment for conditions other than malignant neoplasm: Secondary | ICD-10-CM

## 2010-11-30 NOTE — Patient Instructions (Signed)
The drain needs to slow down to less than 30cc per day to remove.  Go to the ABC class when you can Sheila Garrison may return to work Nov 19

## 2010-11-30 NOTE — Progress Notes (Signed)
Sheila Garrison    [id number] 11/30/2010    21-Jan-1956   CC: Post op mastectomy  HPI: The patient returns for post op follow-up. She underwent a right modified mastectomy on 11/22/2010. Over all she feels that she is doing well.   PE: The incision is healing nicely and there is no evidence of infection or hematoma.  The drains are slowing. The medial drain is about 5cc/day and removed.  DATA REVIEWED: Pathology report showed IDC with 7/8 LN's with mets  IMPRESSION: Patient doing well. R MRM  PLAN: Her next visit will be in one week.

## 2010-12-05 ENCOUNTER — Encounter (HOSPITAL_COMMUNITY): Payer: 59

## 2010-12-06 ENCOUNTER — Encounter: Payer: Self-pay | Admitting: *Deleted

## 2010-12-08 ENCOUNTER — Ambulatory Visit (INDEPENDENT_AMBULATORY_CARE_PROVIDER_SITE_OTHER): Payer: Commercial Managed Care - PPO | Admitting: Surgery

## 2010-12-08 ENCOUNTER — Encounter (INDEPENDENT_AMBULATORY_CARE_PROVIDER_SITE_OTHER): Payer: Self-pay | Admitting: General Surgery

## 2010-12-08 VITALS — BP 120/64 | HR 76 | Resp 20 | Ht 64.5 in | Wt 165.0 lb

## 2010-12-08 DIAGNOSIS — Z09 Encounter for follow-up examination after completed treatment for conditions other than malignant neoplasm: Secondary | ICD-10-CM

## 2010-12-08 NOTE — Patient Instructions (Signed)
I will see you again in three weeks but if you think you have any fluid under the incision call and come in sooner. You may return to work four hours a day

## 2010-12-08 NOTE — Progress Notes (Signed)
Sheila Garrison    [id number] 12/08/2010    1955-11-08   CC: Post op mastectomy  HPI: The patient returns for post op follow-up. She underwent a right modified mastectomy on 11/22/2010. Over all she feels that she is doing well.   PE: The incision is healing nicely and there is no evidence of infection or hematoma.  The remaining drain has almost stopped.  DATA REVIEWED: Pathology report showed IDC with 7/8 LN's with mets  IMPRESSION: Patient doing well. R MRM. Drain can be removed  PLAN: Her next visit will be in three weeks, sooner if any fluid develops  Staples and drain removed today

## 2010-12-10 NOTE — Discharge Summary (Signed)
Sheila Garrison, Sheila Garrison               ACCOUNT NO.:  [id number]  MEDICAL RECORD NO.:  [id number]  LOCATION:  1529                         FACILITY:  [facility]  PHYSICIAN:  Currie Paris, M.D.DATE OF BIRTH:  04-Aug-1955  DATE OF ADMISSION:  11/22/2010 DATE OF DISCHARGE:  11/23/2010                              DISCHARGE SUMMARY   FINAL DIAGNOSIS:  Carcinoma, right breast, stage III, status post neoadjuvant chemotherapy.  CLINICAL HISTORY:  This patient presented several months ago with a fungating mass of the right breast that proved to be invasive ductal carcinoma.  She has undergone several months of chemotherapy with an excellent clinical response although not completely.  She was admitted for an elective modified radical mastectomy.  HOSPITAL COURSE:  The patient was admitted and taken to the operating room where the mastectomy was performed.  She tolerated the procedure well with no particular problems.  Postoperatively she was doing well and able to be discharged today following surgery.  She was tolerating diet.  Her drains were serosanguineous and her pain was well controlled.  She was instructed on management of her JP drains and able to go home.  The patient was discharged in satisfactory condition, to resume usual home medications, given pain medications per the med record sheet. Pathology report showed invasive ductal carcinoma, 4.4 cm, margins not involved.  Metastatic carcinoma was found in 7 of 8 lymph nodes.  The cancer was ER positive at 53%, PR positive at 26%, Ki-67 was 70%, and her HER-2/neu by FISH was 4.14.  The patient will be followed in my office postoperatively for wound management.     Currie Paris, M.D.     CJS/MEDQ  D:  12/09/2010  T:  12/10/2010  Job:  [id number]

## 2010-12-12 ENCOUNTER — Inpatient Hospital Stay (HOSPITAL_COMMUNITY): Payer: 59

## 2010-12-12 ENCOUNTER — Encounter (HOSPITAL_COMMUNITY): Payer: 59 | Attending: Oncology | Admitting: Oncology

## 2010-12-12 ENCOUNTER — Other Ambulatory Visit (HOSPITAL_COMMUNITY): Payer: Self-pay | Admitting: Oncology

## 2010-12-12 ENCOUNTER — Telehealth (HOSPITAL_COMMUNITY): Payer: Self-pay

## 2010-12-12 ENCOUNTER — Encounter (HOSPITAL_BASED_OUTPATIENT_CLINIC_OR_DEPARTMENT_OTHER): Payer: 59

## 2010-12-12 VITALS — BP 123/60 | HR 86 | Temp 98.2°F | Ht 64.5 in | Wt 164.0 lb

## 2010-12-12 DIAGNOSIS — C50119 Malignant neoplasm of central portion of unspecified female breast: Secondary | ICD-10-CM

## 2010-12-12 DIAGNOSIS — Z5112 Encounter for antineoplastic immunotherapy: Secondary | ICD-10-CM

## 2010-12-12 LAB — COMPREHENSIVE METABOLIC PANEL
BUN: 9 mg/dL (ref 6–23)
CO2: 31 mEq/L (ref 19–32)
Chloride: 100 mEq/L (ref 96–112)
Creatinine, Ser: 0.49 mg/dL — ABNORMAL LOW (ref 0.50–1.10)
GFR calc non Af Amer: >90 mL/min (ref >90)
Total Bilirubin: 0.2 mg/dL — ABNORMAL LOW (ref 0.3–1.2)

## 2010-12-12 LAB — CBC
HCT: 37.7 % (ref 36.0–46.0)
Hemoglobin: 12 g/dL (ref 12.0–15.0)
MCHC: 31.8 g/dL (ref 30.0–36.0)
WBC: 8.3 10*3/uL (ref 4.0–10.5)

## 2010-12-12 LAB — DIFFERENTIAL
Lymphocytes Relative: 25 % (ref 12–46)
Lymphs Abs: 2 10*3/uL (ref 0.7–4.0)
Monocytes Absolute: 0.8 10*3/uL (ref 0.1–1.0)
Monocytes Relative: 10 % (ref 3–12)
Neutro Abs: 5.2 10*3/uL (ref 1.7–7.7)

## 2010-12-12 MED ORDER — HEPARIN SOD (PORK) LOCK FLUSH 100 UNIT/ML IV SOLN
500.0000 [IU] | Freq: Once | INTRAVENOUS | Status: AC | PRN
  Administered 2010-12-12: 500 [IU]
  Filled 2010-12-12: qty 5

## 2010-12-12 MED ORDER — SODIUM CHLORIDE 0.9 % IV SOLN
Freq: Once | INTRAVENOUS | Status: AC
  Administered 2010-12-12: 11:00:00 via INTRAVENOUS

## 2010-12-12 MED ORDER — HEPARIN SOD (PORK) LOCK FLUSH 100 UNIT/ML IV SOLN
INTRAVENOUS | Status: AC
  Administered 2010-12-12: 500 [IU]
  Filled 2010-12-12: qty 5

## 2010-12-12 MED ORDER — ACETAMINOPHEN 325 MG PO TABS
650.0000 mg | ORAL_TABLET | Freq: Once | ORAL | Status: AC
  Administered 2010-12-12: 500 mg via ORAL

## 2010-12-12 MED ORDER — SODIUM CHLORIDE 0.9 % IJ SOLN
10.0000 mL | INTRAMUSCULAR | Status: DC | PRN
  Filled 2010-12-12: qty 10

## 2010-12-12 MED ORDER — TRASTUZUMAB CHEMO INJECTION 440 MG
8.0000 mg/kg | Freq: Once | INTRAVENOUS | Status: AC
  Administered 2010-12-12: 630 mg via INTRAVENOUS
  Filled 2010-12-12 (×2): qty 30

## 2010-12-12 MED ORDER — DIPHENHYDRAMINE HCL 25 MG PO CAPS
ORAL_CAPSULE | ORAL | Status: AC
  Administered 2010-12-12: 25 mg via ORAL
  Filled 2010-12-12: qty 1

## 2010-12-12 MED ORDER — DIPHENHYDRAMINE HCL 25 MG PO CAPS
50.0000 mg | ORAL_CAPSULE | Freq: Once | ORAL | Status: AC
  Administered 2010-12-12: 25 mg via ORAL

## 2010-12-12 MED ORDER — ACETAMINOPHEN 500 MG PO TABS
ORAL_TABLET | ORAL | Status: AC
  Administered 2010-12-12: 500 mg via ORAL
  Filled 2010-12-12: qty 1

## 2010-12-12 NOTE — Progress Notes (Signed)
This office note has been dictated.

## 2010-12-12 NOTE — Telephone Encounter (Signed)
Notes Recorded by Randall An, MD on 12/12/2010 at 5:18 PM Does she have K+ at home? If not, K-dur 20 meq once a day # 30 Refills x 1  Message left for patient to call clinic./S.Dacoda Finlay, RN

## 2010-12-12 NOTE — Progress Notes (Signed)
CC:   Sheila Garrison, M.D. Ernestina Penna, M.D. Lowella Dell, M.D.  DIAGNOSIS:  Triple positive breast cancer.  She has finished her definitive chemotherapy by Dr. Darnelle Catalan consisting of carboplatin and then docetaxel.  Trastuzumab was started with the above drugs and is now continued for a total of 52 weeks going forward. She is in the middle of this regimen at this time.  She is now status post a modified radical mastectomy which still showed a 4.4 cm tumor and 7/8 positive nodes but nice treatment response affect.  She only has transferred her trastuzumab care here and I had not met this young lady, so I set aside a time today to see her after her Herceptin therapy.  She is doing very well with that.  She is having no side effects.  She has her 2D echoes already scheduled; one is due in December.  She is to see Dr. Darnelle Catalan anyway on the November 16th for followup.  She has no shortness of breath, no chest pain, etc.  She has healed nicely but did need to be examined today since she is going to see Dr. Darnelle Catalan next week.  So, I introduced myself.  We talked about her therapy to make sure she did not have any questions and made sure she was not having any significant side effects, and we made sure her 2D echoes, etc. were scheduled.  We will see her sometime in January going forward.  We answered a few questions that she had.  I also met her husband.  She will finish her therapy.  She is going to see a radiation therapist in the very near future anyway and then after the radiation she can start her hormonal therapy, which I suspect Dr. Darnelle Catalan has already picked out.  We will see her back in January as I mentioned.    ______________________________ Ladona Horns. Mariel Sleet, MD ESN/MEDQ  D:  12/12/2010  T:  12/12/2010  Job:  [id number]

## 2010-12-12 NOTE — Progress Notes (Signed)
[facility] Discharge Instructions for Patients Receiving Chemotherapy  Today you received the following chemotherapy agents Herceptin loading dose over 90 min. Next dose will be over 60 min    Take 1 tylenol and 1 benadryl before you come for next dose.   If you develop nausea and vomiting that is not controlled by your nausea medication, call the clinic. If it is after clinic hours your family physician or the after hours number for the clinic or go to the Emergency Department.   BELOW ARE SYMPTOMS THAT SHOULD BE REPORTED IMMEDIATELY:  *FEVER GREATER THAN 101.0 F  *CHILLS WITH OR WITHOUT FEVER  NAUSEA AND VOMITING THAT IS NOT CONTROLLED WITH YOUR NAUSEA MEDICATION  *UNUSUAL SHORTNESS OF BREATH  *UNUSUAL BRUISING OR BLEEDING  TENDERNESS IN MOUTH AND THROAT WITH OR WITHOUT PRESENCE OF ULCERS  *URINARY PROBLEMS  *BOWEL PROBLEMS  UNUSUAL RASH Items with * indicate a potential emergency and should be followed up as soon as possible.  One of the nurses will contact you 24 hours after your treatment. Please let the nurse know about any problems that you may have experienced. Feel free to call the clinic you have any questions or concerns. The clinic phone number is [phone].   I have been informed and understand all the instructions given to me. I know to contact the clinic, my physician, or go to the Emergency Department if any problems should occur. I do not have any questions at this time, but understand that I may call the clinic during office hours or the Patient Navigator at [phone] should I have any questions or need assistance in obtaining follow up care.    __________________________________________  _____________  __________ Signature of Patient or Authorized Representative            Date                   Time    __________________________________________ Nurse's Signature

## 2010-12-13 ENCOUNTER — Telehealth (HOSPITAL_COMMUNITY): Payer: Self-pay

## 2010-12-13 NOTE — Telephone Encounter (Signed)
Yes, call in new RX for 1BID with 4 refills/per Dr. Mariel Sleet ----- Message ----- From: Evelena Leyden, RN Sent: 12/12/2010 6:31 PM To: Randall An, MD  Patient is taking Potassium 20 mEq daily. Do you want her to increase to 1 bid? If so, we will need to send new prescription to [facility] in [place]. She has no refills on the bottle that she has   12/13/10 1207 Message left for patient to increase potassium to 1 pill twice daily.  To call back with any questions./S. Mercy Riding, RN

## 2010-12-14 ENCOUNTER — Ambulatory Visit (HOSPITAL_COMMUNITY)
Admission: RE | Admit: 2010-12-14 | Discharge: 2010-12-14 | Disposition: A | Discharge status: Home / Self Care | Payer: 59 | Source: Ambulatory Visit | Attending: Internal Medicine | Admitting: Internal Medicine

## 2010-12-14 VITALS — BP 124/78 | HR 80 | Wt 166.5 lb

## 2010-12-14 DIAGNOSIS — C50919 Malignant neoplasm of unspecified site of unspecified female breast: Secondary | ICD-10-CM | POA: Insufficient documentation

## 2010-12-14 DIAGNOSIS — C50119 Malignant neoplasm of central portion of unspecified female breast: Secondary | ICD-10-CM

## 2010-12-14 NOTE — Patient Instructions (Signed)
ECHO next week at [facility]  Please follow up 2 months.

## 2010-12-14 NOTE — Progress Notes (Signed)
Referring Physician: Dr Darnelle Catalan Primary Physician: Dr Christell Constant Primary Cardiologist:  Reason for Consultation: Breast Cancer/Herceptin   HPI: 55 year old with carcinoma, right breast, stage III, status post  neoadjuvant chemotherapy, S/P modified R mastectomy. Triple positive breast cancer. Herceptin was started and she will be completed in June 2013. She has received 6 cycles of carboplatin, docetaxel, and trastuzumab. She resumed chemo status post modified mastectomy 11/6 at [facility]. She will be followed by Dr Jerelyn Scott.    ECHO 06/2010 EF 60% lateral s prime velocity 11.2 ECHO 10/11/2010 EF 60% lateral s prime velocity 10.4  She is here for consult per Dr Princella Pellegrini request. Denies SOB /CP/ PND. Lower extremity edema on occasion after Chemo.  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            ROS: All other systems normal except as mentioned in HPI, past medical history and problem list.  Family History  Problem Relation Age of Onset  . COPD Mother   . Diabetes Father   . Hypertension Father   . COPD Brother      Past Medical History  Diagnosis Date  . Hypertension   . Nipple discharge     with pain, infection, lump  . MRSA (methicillin resistant staph aureus) culture positive     in breast  . Wears dentures   . Breast ca 07/01/2010    rt breast ca  . Family history of breast cancer     Aunt on father's side    Medications Prior to Admission  Medication Sig Dispense Refill  . diphenhydrAMINE (BENADRYL) 25 MG tablet Take 25 mg by mouth every 6 (six) hours as needed.        . fexofenadine (ALLEGRA) 180 MG tablet Take 180 mg by mouth daily.        . Lansoprazole (PREVACID PO) Take by mouth daily.        Marland Kitchen lidocaine-prilocaine (EMLA) cream Apply topically as needed.        Marland Kitchen LORazepam (ATIVAN) 0.5 MG tablet Take 0.5 mg by mouth as needed.        Marland Kitchen losartan-hydrochlorothiazide (HYZAAR) 100-12.5 MG per tablet Take 1 tablet by mouth  daily.        . Multiple Vitamin (MULTIVITAMIN) capsule Take 1 capsule by mouth daily.        . naproxen sodium (ANAPROX) 220 MG tablet Take 220 mg by mouth as needed.        . potassium chloride SA (K-DUR,KLOR-CON) 20 MEQ tablet Take 20 mEq by mouth 2 (two) times daily.       . rosuvastatin (CRESTOR) 10 MG tablet Take 10 mg by mouth daily.         No current facility-administered medications on file as of 12/14/2010.              No Known Allergies  History   Social History  . Marital Status: Married    Spouse Name: N/A    Number of Children: N/A  . Years of Education: N/A   Occupational History  . Not on file.   Social History Main Topics  . Smoking status: Former Smoker    Quit date: 02/24/2010  . Smokeless tobacco: Not on file  . Alcohol Use: No  . Drug Use: No  . Sexually Active: Not on file   Other Topics Concern  . Not on file   Social History Narrative  . No narrative on  file    Family History  Problem Relation Age of Onset  . COPD Mother   . Diabetes Father   . Hypertension Father   . COPD Brother     PHYSICAL EXAM: Filed Vitals:   12/14/10 1001  BP: 124/78  Pulse: 80    No intake or output data in the 24 hours ending 12/14/10 1029  General:  Well appearing. No respiratory difficulty HEENT: normal Neck: supple. no JVD. Carotids 2+ bilat; no bruits. No lymphadenopathy or thryomegaly appreciated. Cor: PMI nondisplaced. Regular rate & rhythm. No rubs, gallops or murmurs. Lungs: clear Abdomen: soft, nontender, nondistended. No hepatosplenomegaly. No bruits or masses. Good bowel sounds. Extremities: no cyanosis, clubbing, rash, edema Neuro: alert & oriented x 3, cranial nerves grossly intact. moves all 4 extremities w/o difficulty. Affect pleasant.   ASSESSMENT:   PLAN/DISCUSSION:

## 2010-12-14 NOTE — Assessment & Plan Note (Addendum)
Discussed the purpose of heart failure clinic as it relates to breast cancer and Heceptin. Discussed potential cardiotoxicity from chemotherapy. She will be followed at least every 3 months with echos to monitor for adverse effects.Reveiwed ECHO results from May 2012 and September 2012 which revealed a decrease in the s prime velocity. (11.2 down to 10.4)For this reason will repeat ECHO at [facility] next week. She will follow up one month.

## 2010-12-19 ENCOUNTER — Ambulatory Visit (HOSPITAL_COMMUNITY)
Admission: RE | Admit: 2010-12-19 | Discharge: 2010-12-19 | Disposition: A | Discharge status: Home / Self Care | Payer: 59 | Source: Ambulatory Visit | Attending: Adult Health | Admitting: Adult Health

## 2010-12-19 DIAGNOSIS — C50919 Malignant neoplasm of unspecified site of unspecified female breast: Secondary | ICD-10-CM | POA: Insufficient documentation

## 2010-12-19 DIAGNOSIS — I10 Essential (primary) hypertension: Secondary | ICD-10-CM | POA: Insufficient documentation

## 2010-12-19 DIAGNOSIS — Z79899 Other long term (current) drug therapy: Secondary | ICD-10-CM | POA: Insufficient documentation

## 2010-12-19 DIAGNOSIS — C50119 Malignant neoplasm of central portion of unspecified female breast: Secondary | ICD-10-CM

## 2010-12-22 ENCOUNTER — Telehealth: Payer: Self-pay | Admitting: *Deleted

## 2010-12-22 ENCOUNTER — Encounter: Payer: Self-pay | Admitting: Radiation Oncology

## 2010-12-22 ENCOUNTER — Ambulatory Visit (HOSPITAL_BASED_OUTPATIENT_CLINIC_OR_DEPARTMENT_OTHER): Payer: 59 | Admitting: Oncology

## 2010-12-22 ENCOUNTER — Other Ambulatory Visit: Payer: 59 | Admitting: Lab

## 2010-12-22 ENCOUNTER — Ambulatory Visit
Admission: RE | Admit: 2010-12-22 | Discharge: 2010-12-22 | Disposition: A | Discharge status: Home / Self Care | Payer: 59 | Source: Ambulatory Visit | Attending: Radiation Oncology | Admitting: Radiation Oncology

## 2010-12-22 ENCOUNTER — Ambulatory Visit: Payer: 59 | Admitting: Radiation Oncology

## 2010-12-22 VITALS — BP 100/67 | HR 101 | Temp 97.9°F | Ht 64.5 in | Wt 164.0 lb

## 2010-12-22 VITALS — BP 126/78 | HR 93 | Temp 98.1°F | Ht 65.0 in | Wt 163.8 lb

## 2010-12-22 DIAGNOSIS — Z9010 Acquired absence of unspecified breast and nipple: Secondary | ICD-10-CM | POA: Insufficient documentation

## 2010-12-22 DIAGNOSIS — C773 Secondary and unspecified malignant neoplasm of axilla and upper limb lymph nodes: Secondary | ICD-10-CM | POA: Insufficient documentation

## 2010-12-22 DIAGNOSIS — I10 Essential (primary) hypertension: Secondary | ICD-10-CM | POA: Insufficient documentation

## 2010-12-22 DIAGNOSIS — Z803 Family history of malignant neoplasm of breast: Secondary | ICD-10-CM | POA: Insufficient documentation

## 2010-12-22 DIAGNOSIS — C50419 Malignant neoplasm of upper-outer quadrant of unspecified female breast: Secondary | ICD-10-CM | POA: Insufficient documentation

## 2010-12-22 DIAGNOSIS — C50919 Malignant neoplasm of unspecified site of unspecified female breast: Secondary | ICD-10-CM

## 2010-12-22 DIAGNOSIS — C50119 Malignant neoplasm of central portion of unspecified female breast: Secondary | ICD-10-CM

## 2010-12-22 DIAGNOSIS — R599 Enlarged lymph nodes, unspecified: Secondary | ICD-10-CM | POA: Insufficient documentation

## 2010-12-22 DIAGNOSIS — Z87891 Personal history of nicotine dependence: Secondary | ICD-10-CM | POA: Insufficient documentation

## 2010-12-22 DIAGNOSIS — E785 Hyperlipidemia, unspecified: Secondary | ICD-10-CM | POA: Insufficient documentation

## 2010-12-22 HISTORY — DX: Hyperlipidemia, unspecified: E78.5

## 2010-12-22 HISTORY — DX: Polyneuropathy, unspecified: G62.9

## 2010-12-22 NOTE — Progress Notes (Signed)
Ca right breast, ER+, PR+ (low), High S-phase at 70%.  Received 6 cycles of Carboplatin, Docetaxel, and Trastuzumab which completed the end of September.  Herceptin started 12/12/10.  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  Denies any Pain.  States she is "bored" and will resume working on Monday next week.   Reports "good" appetite.

## 2010-12-22 NOTE — Progress Notes (Signed)
[facility] Radiation Oncology NEW PATIENT EVALUATION  Name: Sheila Garrison MRN: [id number]  Date: 12/22/2010  DOB: [birth year]  Status:outpatient    WU:[id number],JYNWGN Andrey Campanile, MD, MD  Magrinat, Valentino Hue, MD    REFERRING PHYSICIAN: Magrinat, Valentino Hue, MD   DIAGNOSIS: [id number] breast cancer (right breast upper outer quadrant), ypT2N2aMx; triple positive invasive ductal carcinoma  HISTORY OF PRESENT ILLNESS::Sheila Garrison is a 55 y.o. female who is evaluated for locally advanced breast cancer. She presented with [phone] breast cancer; I have reviewed her PET scan at diagnosis which showed supraclavicular, axillary, possible internal mammary, and subpectoral lymphadenopathy. She acknowledges that she had a breast mass for at least 2 years before seeking medical attention. It was fungating through skin by the time she was seen by Dr. Jamey Ripa.  He referred her to Dr. Darnelle Catalan who neoadjuvantly  gave her 6 cycles of carboplatin, docetaxel, and herceptin. She had a partial response. Mastectomy with lymph node dissection on 10-17 revealed ypT2N2aMx disease; margins are clear; 7/8 nodes were positive from the axilla.  She is to continue Herceptin with Dr. Mariel Sleet and later start anti estrogen therapy. She is returning to work next week, and has healed well from surgery.  She has some decreased range of motion in her right shoulder but this is improving with exercise. 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   PREVIOUS RADIATION THERAPY: No   PAST MEDICAL HISTORY:  has a past medical history of Hypertension; Nipple discharge; MRSA (methicillin resistant staph aureus) culture positive; Wears dentures; Breast ca (07/01/2010); Family history of breast cancer; Neuropathy; and Hyperlipemia.     PAST SURGICAL HISTORY: Past Surgical History  Procedure Date  . Tubal ligation   . Dilation and curettage of uterus   . Breast surgery 2012     FAMILY HISTORY: family  history includes COPD in her brother and mother; Cancer in her brother, maternal aunt, and paternal aunt; Diabetes in her father; and Hypertension in her father.   SOCIAL HISTORY:  reports that she quit smoking about 9 months ago. Her smoking use included Cigarettes. She has a 45 pack-year smoking history. She does not have any smokeless tobacco history on file. She reports that she does not drink alcohol or use illicit drugs.   ALLERGIES: Review of patient's allergies indicates no known allergies.   MEDICATIONS: Current outpatient prescriptions:Cholecalciferol (VITAMIN D-3 PO), Take by mouth.  , Disp: , Rfl: ;  diphenhydrAMINE (BENADRYL) 25 MG tablet, Take 25 mg by mouth every 6 (six) hours as needed.  , Disp: , Rfl: ;  fexofenadine (ALLEGRA) 180 MG tablet, Take 180 mg by mouth daily.  , Disp: , Rfl: ;  Lansoprazole (PREVACID PO), Take by mouth daily.  , Disp: , Rfl: ;  lidocaine-prilocaine (EMLA) cream, Apply topically as needed.  , Disp: , Rfl:  LORazepam (ATIVAN) 0.5 MG tablet, Take 0.5 mg by mouth as needed.  , Disp: , Rfl: ;  losartan-hydrochlorothiazide (HYZAAR) 100-12.5 MG per tablet, Take 1 tablet by mouth daily.  , Disp: , Rfl: ;  Multiple Vitamin (MULTIVITAMIN) capsule, Take 1 capsule by mouth daily.  , Disp: , Rfl: ;  naproxen sodium (ANAPROX) 220 MG tablet, Take 220 mg by mouth as needed.  , Disp: , Rfl:  potassium chloride SA (K-DUR,KLOR-CON) 20 MEQ tablet, Take 20 mEq by mouth 2 (two) times daily. , Disp: , Rfl: ;  rosuvastatin (CRESTOR) 10 MG tablet, Take 10 mg by mouth daily.  , Disp: , Rfl:  REVIEW OF SYSTEMS:  A comprehensive review of systems was negative.- other than that noted above in the HPI.    PHYSICAL EXAM:  height is 5' 4.5" (1.638 m) and weight is 164 lb (74.39 kg). Her temperature is 97.9 F (36.6 C). Her blood pressure is 100/67 and her pulse is 101.   BP 100/67  Pulse 101  Temp 97.9 F (36.6 C)  Ht 5' 4.5" (1.638 m)  Wt 164 lb (74.39 kg)  BMI 27.72 kg/m2  LMP  03/08/2010  General Appearance:    Alert, cooperative, no distress, appears stated age  Head:    Normocephalic, with alopecia  Eyes:    PERRL, conjunctiva/corneas clear, EOM's intact,both eyes     Nose:   Throat:   Lips, mucosa, and tongue normal; + dentures  Neck:   Supple, symmetrical, trachea midline, no adenopathy;      Back:     ROM normal  Lungs:     Clear to auscultation bilaterally, respirations unlabored      Heart:    Regular rate and rhythm, S1 and S2 normal, no murmur, rub   or gallop  Breast Exam:   Right chest wall shows healed mastectomy scar.  Left breast has no palpable lesions.  Abdomen:     Soft, non-tender        Extremities:   Extremities normal, atraumatic, no cyanosis or edema; some decreased range of motion in right shoulder     Skin:   Skin color, texture, turgor normal, no rashes or lesions  Lymph nodes:   Cervical, supraclavicular, and axillary nodes normal   Neurologic:   CNII-XII intact, normal strength, sensation      LABORATORY DATA: 10/18 labs reviewed, satisfactory  PATHOLOGY: AS ABOVE  RADIOGRAPHY: As above     IMPRESSION: [id number] breast cancer, triple positive.  Status post chemotherapy and surgery.  PLAN: I recommend adjuvant radiotherapy.  She is at extraordinarily high risk for local recurrence.  I will treat the chest wall and all regional lymph nodes as comprehensively as I can while keeping her lung volume within acceptable limits to decrease toxicity.  I explained that therapy will last 6-7 weeks, I will schedule treatment for her in [place] as this will be much closer to her home.  She requested this location specifically.  I hope to commence treatment by the end of the month. Risks of therapy were discussed; a consent form has been signed; she is enthusiastic to proceed.  I spent 60 minutes minutes face to face with the patient and more than 50% of that time was spent in counseling and/or coordination of care.

## 2010-12-22 NOTE — Telephone Encounter (Signed)
GAVE PATIENT APPOINTMENT FOR DR.NEIJSTROM'S OFFICE ON 02-13-2011 AT 12:00PM

## 2010-12-22 NOTE — Progress Notes (Signed)
Please see the Nurse Progress Note in the MD Initial Consult Encounter for this patient. 

## 2010-12-22 NOTE — Progress Notes (Signed)
Addended by: Billey Co on: 12/22/2010 04:35 PM   Modules accepted: Medications

## 2010-12-22 NOTE — Progress Notes (Signed)
ID: Alfredo Martinez   Interval History: Rudie Meyer returns today for followup of her breast cancer. Since her last visit here she had her definitive right mastectomy and axillary lymph node sampling 11/22/2010. Results are discussed below. She tolerated the surgery well, with minimal pain no fever no unusual bleeding no dehiscence or inflammation. She has established herself in [facility] for her trastuzumab treatments and has met Dr. Yevonne Aline, whom she really go along with. In fact she would like to switch her care to Dr. Mariel Sleet (I let her know he took care of my wife's breast cancer a long time ago). She is meeting with Dr. Basilio Cairo today to discuss radiation. She will be receiving that through the [facility] clinic  ROS:  She is doing very well as far as her review of systems is concerned and is planning to go back to work on Monday of next week. She has had no unusual headaches visual changes cough phlegm production pleurisy shortness of breath or change in bowel or bladder habits. A detailed review of systems was otherwise noncontributory.  Medications: I have reviewed the patient's current medications.   Current Outpatient Prescriptions  Medication Sig Dispense Refill  . Cholecalciferol (VITAMIN D-3 PO) Take by mouth.        . diphenhydrAMINE (BENADRYL) 25 MG tablet Take 25 mg by mouth every 6 (six) hours as needed.        . fexofenadine (ALLEGRA) 180 MG tablet Take 180 mg by mouth daily.        . Lansoprazole (PREVACID PO) Take by mouth daily.        Marland Kitchen lidocaine-prilocaine (EMLA) cream Apply topically as needed.        Marland Kitchen LORazepam (ATIVAN) 0.5 MG tablet Take 0.5 mg by mouth as needed.        Marland Kitchen losartan-hydrochlorothiazide (HYZAAR) 100-12.5 MG per tablet Take 1 tablet by mouth daily.        . Multiple Vitamin (MULTIVITAMIN) capsule Take 1 capsule by mouth daily.        . naproxen sodium (ANAPROX) 220 MG tablet Take 220 mg by mouth as needed.        . potassium chloride SA (K-DUR,KLOR-CON)  20 MEQ tablet Take 20 mEq by mouth 2 (two) times daily.       . rosuvastatin (CRESTOR) 10 MG tablet Take 10 mg by mouth daily.           Objective: Vital signs in last 24 hours: BP 126/78  Pulse 93  Temp 98.1 F (36.7 C)  Ht 5\' 5"  (1.651 m)  Wt 163 lb 12.8 oz (74.299 kg)  BMI 27.26 kg/m2   Physical Exam:    Sclerae unicteric  Oropharynx clear  No peripheral adenopathy  Lungs clear -- no rales or rhonchi  Heart regular rate and rhythm  Abdomen benign  MSK no focal spinal tenderness, no peripheral edema  Neuro nonfocal  Breast exam: The right breast is status post mastectomy. The incision is healing nicely, with no dehiscence swelling or erythema. There is no unusual tenderness. Left breast is unremarkable.  Lab Results:   CMP  Lab Results  Component Value Date   GLUCOSE 71 12/12/2010   ALT 23 12/12/2010   AST 22 12/12/2010   NA 140 12/12/2010   K 3.2* 12/12/2010   CL 100 12/12/2010   CREATININE 0.49* 12/12/2010   BUN 9 12/12/2010   CO2 31 12/12/2010     Lab Results  Component Value Date   WBC 8.3  12/12/2010   HGB 12.0 12/12/2010   HCT 37.7 12/12/2010   MCV 103.3* 12/12/2010   PLT 296 12/12/2010        Studies/Results:  (1) the pathology report from her October 17 right mastectomy ([id number]) showed both skin biopsies to be negative; there was a 4.4 cm residual invasive ductal carcinoma, grade 3, involving 7/8 lymph nodes sampled. Margins were ample  (2) she had an echocardiogram under Jesusita Oka Ben-Simhon,  who follows all our HDR 2 positive patients; the study was performed November 13 and showed an ejection fraction in the 55-60% range, unchanged from prior  Assessment:  55 year old [place] woman originally presenting with an ulcerated right breast mass, clinically T4 N2, or stage IIIc, with evidence of local and regional spread including internal mammary and supraclavicular lymph nodes by PET scan, but with no evidence of distant metastatic disease; the tumor being  triple positive with an MIB-1-1 of 70%; treated adjuvantly with carboplatin/trastuzumab/docetaxel x6 completed 10/31/2010, followed by definitive right modified radical mastectomy, pathology showing a residual ypT2 ypN2 invasive ductal carcinoma, grade 3, with ample margins.   Plan: She will meet with Dr. Basilio Cairo today to plan her radiation treatments, which will be given in [place]. She will have her next trastuzumab treatment at [facility] November 27. She really has an appointment with Dr. Mariel Sleet January 8, and she requests continuing followup with Dr. Donneta Romberg strum area accordingly we are not making any further appointments for she like year although she knows of course of we will be glad to see her at any point in the future if the need arises. When she completes her radiation treatments she will discuss antiestrogen therapy with Dr. Mariel Sleet.  Elizjah Noblet C 12/22/2010

## 2010-12-27 ENCOUNTER — Telehealth (HOSPITAL_COMMUNITY): Payer: Self-pay | Admitting: *Deleted

## 2010-12-27 NOTE — Telephone Encounter (Signed)
Ms Wilhide called returning your call.  She will have her cell phone on her for you to call her back.

## 2010-12-27 NOTE — Telephone Encounter (Signed)
Pt given echo results 

## 2011-01-02 ENCOUNTER — Inpatient Hospital Stay (HOSPITAL_COMMUNITY): Payer: Self-pay

## 2011-01-02 ENCOUNTER — Encounter (HOSPITAL_BASED_OUTPATIENT_CLINIC_OR_DEPARTMENT_OTHER): Payer: 59

## 2011-01-02 VITALS — BP 128/75 | HR 83 | Temp 97.5°F | Ht 65.0 in | Wt 166.4 lb

## 2011-01-02 DIAGNOSIS — Z5112 Encounter for antineoplastic immunotherapy: Secondary | ICD-10-CM

## 2011-01-02 DIAGNOSIS — C50119 Malignant neoplasm of central portion of unspecified female breast: Secondary | ICD-10-CM

## 2011-01-02 MED ORDER — HEPARIN SOD (PORK) LOCK FLUSH 100 UNIT/ML IV SOLN
INTRAVENOUS | Status: AC
  Administered 2011-01-02: 500 [IU]
  Filled 2011-01-02: qty 5

## 2011-01-02 MED ORDER — HEPARIN SOD (PORK) LOCK FLUSH 100 UNIT/ML IV SOLN
500.0000 [IU] | Freq: Once | INTRAVENOUS | Status: AC | PRN
  Administered 2011-01-02: 500 [IU]
  Filled 2011-01-02: qty 5

## 2011-01-02 MED ORDER — TRASTUZUMAB CHEMO INJECTION 440 MG
6.0000 mg/kg | Freq: Once | INTRAVENOUS | Status: AC
  Administered 2011-01-02: 462 mg via INTRAVENOUS
  Filled 2011-01-02 (×2): qty 22

## 2011-01-02 MED ORDER — SODIUM CHLORIDE 0.9 % IV SOLN
Freq: Once | INTRAVENOUS | Status: AC
  Administered 2011-01-02: 15:00:00 via INTRAVENOUS

## 2011-01-02 NOTE — Progress Notes (Signed)
Tolerated infusion well. 

## 2011-01-05 ENCOUNTER — Ambulatory Visit (INDEPENDENT_AMBULATORY_CARE_PROVIDER_SITE_OTHER): Payer: Self-pay | Admitting: Surgery

## 2011-01-05 ENCOUNTER — Encounter (INDEPENDENT_AMBULATORY_CARE_PROVIDER_SITE_OTHER): Payer: Self-pay | Admitting: Surgery

## 2011-01-05 VITALS — BP 110/78 | HR 80 | Temp 98.0°F | Resp 12 | Ht 64.5 in | Wt 165.0 lb

## 2011-01-05 DIAGNOSIS — Z9889 Other specified postprocedural states: Secondary | ICD-10-CM

## 2011-01-05 NOTE — Progress Notes (Signed)
Sheila Garrison    [id number] 01/05/2011    14-May-1955   CC: Post op mastectomy  HPI: The patient returns for post op follow-up. She underwent a right modified mastectomy on 11/22/2010. Over all she feels that she is doing well.   PE: The incision is healing nicely and there is no evidence of infection or hematoma.Slight cording in Right axilla    DATA REVIEWED: No new data  IMPRESSION:  Doing well PLAN: RTC two months after radiation is complet

## 2011-01-16 ENCOUNTER — Ambulatory Visit (HOSPITAL_COMMUNITY): Payer: 59

## 2011-01-23 ENCOUNTER — Encounter (HOSPITAL_COMMUNITY): Payer: 59 | Attending: Oncology

## 2011-01-23 DIAGNOSIS — Z5112 Encounter for antineoplastic immunotherapy: Secondary | ICD-10-CM

## 2011-01-23 DIAGNOSIS — C50119 Malignant neoplasm of central portion of unspecified female breast: Secondary | ICD-10-CM | POA: Insufficient documentation

## 2011-01-23 MED ORDER — HEPARIN SOD (PORK) LOCK FLUSH 100 UNIT/ML IV SOLN
500.0000 [IU] | Freq: Once | INTRAVENOUS | Status: AC | PRN
  Administered 2011-01-23: 500 [IU]
  Filled 2011-01-23: qty 5

## 2011-01-23 MED ORDER — TRASTUZUMAB CHEMO INJECTION 440 MG
6.0000 mg/kg | Freq: Once | INTRAVENOUS | Status: AC
  Administered 2011-01-23: 462 mg via INTRAVENOUS
  Filled 2011-01-23: qty 22

## 2011-01-23 MED ORDER — SODIUM CHLORIDE 0.9 % IV SOLN
Freq: Once | INTRAVENOUS | Status: AC
  Administered 2011-01-23: 15:00:00 via INTRAVENOUS

## 2011-01-23 MED ORDER — HEPARIN SOD (PORK) LOCK FLUSH 100 UNIT/ML IV SOLN
INTRAVENOUS | Status: AC
  Filled 2011-01-23: qty 5

## 2011-01-23 NOTE — Progress Notes (Signed)
Tolerated herceptin infusion well.  States took tylenol and benedryl pre-med at home prior to arriving at clinic;

## 2011-02-13 ENCOUNTER — Encounter (HOSPITAL_COMMUNITY): Payer: 59 | Attending: Oncology

## 2011-02-13 ENCOUNTER — Encounter (HOSPITAL_BASED_OUTPATIENT_CLINIC_OR_DEPARTMENT_OTHER): Payer: 59 | Admitting: Oncology

## 2011-02-13 VITALS — BP 115/62 | HR 80 | Temp 97.6°F | Wt 163.8 lb

## 2011-02-13 DIAGNOSIS — E876 Hypokalemia: Secondary | ICD-10-CM | POA: Insufficient documentation

## 2011-02-13 DIAGNOSIS — C50119 Malignant neoplasm of central portion of unspecified female breast: Secondary | ICD-10-CM | POA: Insufficient documentation

## 2011-02-13 DIAGNOSIS — C50419 Malignant neoplasm of upper-outer quadrant of unspecified female breast: Secondary | ICD-10-CM

## 2011-02-13 DIAGNOSIS — Z5111 Encounter for antineoplastic chemotherapy: Secondary | ICD-10-CM

## 2011-02-13 DIAGNOSIS — C773 Secondary and unspecified malignant neoplasm of axilla and upper limb lymph nodes: Secondary | ICD-10-CM

## 2011-02-13 LAB — CBC
MCH: 29.9 pg (ref 26.0–34.0)
Platelets: 289 10*3/uL (ref 150–400)
RBC: 4.21 MIL/uL (ref 3.87–5.11)
RDW: 14.8 % (ref 11.5–15.5)
WBC: 6.6 10*3/uL (ref 4.0–10.5)

## 2011-02-13 LAB — COMPREHENSIVE METABOLIC PANEL
ALT: 13 U/L (ref 0–35)
AST: 19 U/L (ref 0–37)
Alkaline Phosphatase: 80 U/L (ref 39–117)
GFR calc Af Amer: >90 mL/min (ref >90)
Glucose, Bld: 148 mg/dL — ABNORMAL HIGH (ref 70–99)
Potassium: 3.4 mEq/L — ABNORMAL LOW (ref 3.5–5.1)
Sodium: 140 mEq/L (ref 135–145)
Total Protein: 6.8 g/dL (ref 6.0–8.3)

## 2011-02-13 LAB — DIFFERENTIAL
Basophils Absolute: 0 10*3/uL (ref 0.0–0.1)
Eosinophils Absolute: 0.2 10*3/uL (ref 0.0–0.7)
Lymphocytes Relative: 17 % (ref 12–46)
Lymphs Abs: 1.1 10*3/uL (ref 0.7–4.0)
Neutrophils Relative %: 71 % (ref 43–77)

## 2011-02-13 MED ORDER — SODIUM CHLORIDE 0.9 % IV SOLN
Freq: Once | INTRAVENOUS | Status: AC
  Administered 2011-02-13: 11:00:00 via INTRAVENOUS

## 2011-02-13 MED ORDER — HEPARIN SOD (PORK) LOCK FLUSH 100 UNIT/ML IV SOLN
500.0000 [IU] | Freq: Once | INTRAVENOUS | Status: AC | PRN
  Administered 2011-02-13: 500 [IU]
  Filled 2011-02-13: qty 5

## 2011-02-13 MED ORDER — POTASSIUM CHLORIDE CRYS ER 20 MEQ PO TBCR: 20.0000 meq | EXTENDED_RELEASE_TABLET | Freq: Three times a day (TID) | ORAL | Status: DC

## 2011-02-13 MED ORDER — LETROZOLE 2.5 MG PO TABS: 2.5000 mg | ORAL_TABLET | Freq: Every day | ORAL | Status: AC

## 2011-02-13 MED ORDER — TRASTUZUMAB CHEMO INJECTION 440 MG
6.0000 mg/kg | Freq: Once | INTRAVENOUS | Status: AC
  Administered 2011-02-13: 462 mg via INTRAVENOUS
  Filled 2011-02-13: qty 22

## 2011-02-13 MED ORDER — HEPARIN SOD (PORK) LOCK FLUSH 100 UNIT/ML IV SOLN
INTRAVENOUS | Status: AC
  Administered 2011-02-13: 500 [IU]
  Filled 2011-02-13: qty 5

## 2011-02-13 NOTE — Progress Notes (Signed)
CC:   Ernestina Penna, M.D. Bevelyn Buckles. Bensimhon, MD Currie Paris, M.D. Grayland Jack, M.D.  DIAGNOSIS: 1. 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  She is N3 because     of the clinical involvement of the axillary nodes, as well as the     internal mammary nodes, and there was a suspicious ipsilateral     supraclavicular lymph node. 2. History of chronic obstructive pulmonary disease, having smoked for     many, many years, quitting 1 year ago this Sunday, she states. 3. History of tubal ligation. 4. History of dilation and curettage of the uterus many years ago.  HISTORY:  This is a very pleasant gal who was aware of a breast mass up to 5 years prior to her diagnosis.  When it eventually eroded through the skin, she had very little options, she felt.  So she came to the attention of people back in May of this past year with very extensive disease on a PET scan, large breast mass, axillary nodes, probable supraclavicular node and internal mammary node involvement.  So she had really quite extensive stage IIIC disease.  She was treated with carboplatin, docetaxel and trastuzumab, and she received chemotherapy per Dr. Darnelle Catalan in [place] until September when she had definitive surgery by Dr. Jamey Ripa; that surgery took place on 11/22/2010.  At that time, she still had significant mass, numerous positive nodes.  She had LVI.  She also had skin lymphatic invasion as well.  Her original biopsy was in May, which showed that she was ER positive, PR positive, HER2/neu positive with a high Ki-67 marker.  She had 6 full cycles, as I mentioned, of the chemotherapy prior to surgery.  She then had an interruption in her trastuzumab during her surgery.  Her surgery went well.  We reinitiated the Herceptin on December 12, 2010, and she has had a total of 10 doses out of a planned 69.  The 17 doses will give her a full year of therapy since she was receiving it every  21 days.  She is here today, and she is doing very well.  She is to see Dr. Gala Romney at the end of the month.  She probably will ask him to change her 2D echo, since that is due in March, and she is a little further out than she should be with that test, and she states that he will arrange for that.  She is working through this therapy doing very well.  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  Today she looks very good.  She feels good.  She denies any trouble with breathing or chest pain.  She is not aware of any lumps anywhere.  PHYSICAL EXAMINATION:  Vital Signs:  Her weight is 163 pounds.  Her blood pressure is 115/62 today in the left arm sitting position, pulse 80 and regular, respirations 16-18 and unlabored.  She is afebrile. General:  She denies any pain.  The right chest wall is diffusely red from the radiation.  She has a total of 7 to 8 more treatments. Lymphatics:  She has no adenopathy that I can appreciate in the supraclavicular, cervical, infraclavicular, axillary, or inguinal areas. Lungs:  Show diffuse decreased breath sounds.  Heart:  Shows a regular rhythm and rate without obvious murmur, rub, or gallop.  Port is intact. Breasts:  Left breast is negative for any masses.  There is  no nodularity to the chest wall that I can appreciate.  Abdomen:  Shows no hepatosplenomegaly.  No masses.  Bowel sounds are normal.  Extremities: She has no arm or leg edema.  She looks very good overall.  So I think she is doing very, very well.  Her labs are holding up very nicely.  On her labs today, her CBC and diff are unremarkable.  CMET showed an albumin of 3.2, glucose of 148, potassium of 3.4, which is better than it was at 3.2.  She has been on one Klor-Con 20 mEq b.i.d. We will increase her to 3 times a day for a week and then she can back off to twice a day for maintenance.  If she needs any further changes, I am sure Dr.  Gala Romney will also tell her so.  She is going to initiate letrozole on the 1st of February.  That will be for 5 full years if all goes well.  I will see her in 6 weeks, sooner if need be.    ______________________________ Ladona Horns. Mariel Sleet, MD ESN/MEDQ  D:  02/13/2011  T:  02/13/2011  Job:  [id number]

## 2011-02-13 NOTE — Patient Instructions (Signed)
Sheila Garrison  [id number] 1955/08/16   [facility]  Discharge Instructions  RECOMMENDATIONS MADE BY THE CONSULTANT AND ANY TEST RESULTS WILL BE SENT TO YOUR REFERRING DOCTOR.   EXAM FINDINGS BY MD TODAY AND SIGNS AND SYMPTOMS TO REPORT TO CLINIC OR PRIMARY MD: Report any new lumps, bone pain, fevers, or shortness of breath.  MEDICATIONS PRESCRIBED: Increase your Klor Con to three times daily for 7 days then back to twice daily Letrozole 2.5mg  take 1 daily starting 03/09/11. Follow label directions  SPECIAL INSTRUCTIONS/FOLLOW-UP: Return to Clinic in 6 weeks.   I acknowledge that I have been informed and understand all the instructions given to me and received a copy. I do not have any more questions at this time, but understand that I may call the Specialty Clinic at [facility] at [phone] during business hours should I have any further questions or need assistance in obtaining follow-up care.    __________________________________________  _____________  __________ Signature of Patient or Authorized Representative            Date                   Time    __________________________________________ Nurse's Signature

## 2011-02-13 NOTE — Progress Notes (Signed)
This office note has been dictated.

## 2011-02-14 ENCOUNTER — Telehealth (HOSPITAL_COMMUNITY): Payer: Self-pay

## 2011-02-15 ENCOUNTER — Telehealth (HOSPITAL_COMMUNITY): Payer: Self-pay | Admitting: Oncology

## 2011-02-15 ENCOUNTER — Other Ambulatory Visit (HOSPITAL_COMMUNITY): Payer: Self-pay | Admitting: *Deleted

## 2011-02-15 NOTE — Progress Notes (Signed)
Chemo scheduled for 03/27/2011 after MD appt at pt's request.

## 2011-03-06 ENCOUNTER — Inpatient Hospital Stay (HOSPITAL_COMMUNITY): Payer: 59

## 2011-03-06 ENCOUNTER — Encounter (HOSPITAL_BASED_OUTPATIENT_CLINIC_OR_DEPARTMENT_OTHER): Payer: 59

## 2011-03-06 DIAGNOSIS — C50119 Malignant neoplasm of central portion of unspecified female breast: Secondary | ICD-10-CM

## 2011-03-06 DIAGNOSIS — C773 Secondary and unspecified malignant neoplasm of axilla and upper limb lymph nodes: Secondary | ICD-10-CM

## 2011-03-06 DIAGNOSIS — Z5111 Encounter for antineoplastic chemotherapy: Secondary | ICD-10-CM

## 2011-03-06 MED ORDER — HEPARIN SOD (PORK) LOCK FLUSH 100 UNIT/ML IV SOLN
INTRAVENOUS | Status: AC
  Administered 2011-03-06: 500 [IU]
  Filled 2011-03-06: qty 5

## 2011-03-06 MED ORDER — TRASTUZUMAB CHEMO INJECTION 440 MG
6.0000 mg/kg | Freq: Once | INTRAVENOUS | Status: AC
  Administered 2011-03-06: 462 mg via INTRAVENOUS
  Filled 2011-03-06: qty 22

## 2011-03-06 MED ORDER — SODIUM CHLORIDE 0.9 % IJ SOLN
10.0000 mL | INTRAMUSCULAR | Status: DC | PRN
  Filled 2011-03-06: qty 10

## 2011-03-06 MED ORDER — SODIUM CHLORIDE 0.9 % IV SOLN
Freq: Once | INTRAVENOUS | Status: AC
  Administered 2011-03-06: 14:00:00 via INTRAVENOUS

## 2011-03-06 NOTE — Progress Notes (Signed)
Tolerated herceptin very well.

## 2011-03-07 ENCOUNTER — Telehealth (HOSPITAL_COMMUNITY): Payer: Self-pay

## 2011-03-07 NOTE — Telephone Encounter (Signed)
msg left to call cliinic with concerns or questions.

## 2011-03-08 ENCOUNTER — Encounter (HOSPITAL_COMMUNITY): Payer: Self-pay

## 2011-03-08 ENCOUNTER — Ambulatory Visit (HOSPITAL_COMMUNITY)
Admission: RE | Admit: 2011-03-08 | Discharge: 2011-03-08 | Disposition: A | Discharge status: Home / Self Care | Payer: 59 | Source: Ambulatory Visit | Attending: Internal Medicine | Admitting: Internal Medicine

## 2011-03-08 VITALS — BP 132/68 | HR 78 | Wt 160.2 lb

## 2011-03-08 DIAGNOSIS — E876 Hypokalemia: Secondary | ICD-10-CM

## 2011-03-08 DIAGNOSIS — R0989 Other specified symptoms and signs involving the circulatory and respiratory systems: Secondary | ICD-10-CM | POA: Insufficient documentation

## 2011-03-08 DIAGNOSIS — C50919 Malignant neoplasm of unspecified site of unspecified female breast: Secondary | ICD-10-CM | POA: Insufficient documentation

## 2011-03-08 DIAGNOSIS — R0602 Shortness of breath: Secondary | ICD-10-CM | POA: Insufficient documentation

## 2011-03-08 DIAGNOSIS — R0609 Other forms of dyspnea: Secondary | ICD-10-CM | POA: Insufficient documentation

## 2011-03-08 DIAGNOSIS — Z09 Encounter for follow-up examination after completed treatment for conditions other than malignant neoplasm: Secondary | ICD-10-CM

## 2011-03-08 DIAGNOSIS — C50119 Malignant neoplasm of central portion of unspecified female breast: Secondary | ICD-10-CM

## 2011-03-08 NOTE — Progress Notes (Signed)
Patient ID: Sheila Garrison, female   DOB: Sep 16, 1955, 56 y.o.   MRN: [id number] HPI: 56 year old with carcinoma, right breast, stage III, status post neoadjuvant chemotherapy, S/P modified R mastectomy. Triple positive breast cancer. Herceptin was started and she will be completed in June 2013. She has received 6 cycles of carboplatin, docetaxel, and trastuzumab. She resumed chemo status post modified mastectomy 11/6 at [facility]. She will be followed by Dr Jerelyn Scott.   ECHO 06/2010 EF 60% lateral s prime velocity 11.2  ECHO 10/11/2010 EF 60% lateral s prime velocity 10.4  ECHO 12/19/10 EF 55-60% lateral s prime velocity 10.5 ECHO 03/08/11 EF 55% Lateral S' 10.5   Feels good. Last Herceptin March 05, 2010. This morning reports dypnea and chest tightness. Occasional productive cough. (clear sputum).  Weight at home 158-160. She had Bronchitis  1 week ago and she was given a Z-Pack. Denies orthopnea/PND/edema. 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     ROS: All systems negative except as listed in HPI, PMH and Problem List.  Past Medical History  Diagnosis Date  . Hypertension   . Nipple discharge     with pain, infection, lump  . MRSA (methicillin resistant staph aureus) culture positive     in breast  . Wears dentures   . Breast CA 07/01/2010    rt breast ca  . Family history of breast cancer     Aunt on father's side  . Neuropathy     bilateral toes  . Hyperlipemia     Current Outpatient Prescriptions  Medication Sig Dispense Refill  . Cholecalciferol (VITAMIN D-3 PO) Take by mouth.        . fexofenadine (ALLEGRA) 180 MG tablet Take 180 mg by mouth daily.        . Lansoprazole (PREVACID PO) Take by mouth daily.        Marland Kitchen letrozole (FEMARA) 2.5 MG tablet Take 1 tablet (2.5 mg total) by mouth daily.  30 tablet  12  . lidocaine-prilocaine (EMLA) cream Apply topically as needed.        Marland Kitchen LORazepam (ATIVAN) 0.5 MG tablet Take 0.5 mg by mouth as needed.        Marland Kitchen  losartan-hydrochlorothiazide (HYZAAR) 100-12.5 MG per tablet Take 1 tablet by mouth daily.        . Multiple Vitamin (MULTIVITAMIN) capsule Take 1 capsule by mouth daily.        . rosuvastatin (CRESTOR) 10 MG tablet Take 10 mg by mouth daily.        . diphenhydrAMINE (BENADRYL) 25 MG tablet Take 25 mg by mouth every 6 (six) hours as needed.        . naproxen sodium (ANAPROX) 220 MG tablet Take 220 mg by mouth as needed.        . potassium chloride SA (K-DUR,KLOR-CON) 20 MEQ tablet Take 1 tablet (20 mEq total) by mouth 3 (three) times daily.  100 tablet  3     PHYSICAL EXAM: Filed Vitals:   03/08/11 0828  BP: 132/68  Pulse: 78   Weight change:   General:  Well appearing. No resp difficulty HEENT: normal Neck: supple. JVP 6-7. Carotids 2+ bilaterally; no bruits. No lymphadenopathy or thryomegaly appreciated. Cor: PMI normal. Regular rate & rhythm. No rubs, gallops or murmurs. Lungs:  decreased in bases.  Abdomen: soft, nontender, nondistended. No hepatosplenomegaly. No bruits or masses. Good bowel sounds. Extremities: no cyanosis, clubbing, rash, edema Neuro: alert & orientedx3, cranial nerves grossly  intact. Moves all 4 extremities w/o difficulty. Affect pleasant.   ASSESSMENT & PLAN:

## 2011-03-08 NOTE — Assessment & Plan Note (Addendum)
Volume status stable, however complains of dyspnea this am. ECHO performed in clinic. EF 55% lateral S ' 10.5. ECHO stable. She will obtain CXR at Dr Buel Ream office and obtain PFTs.  Continue Herceptin. Instructed to contact [facility] Failure [facility] if she has dyspnea.  Follow up in 3 months with ECHO.   Patient seen and examined with Tonye Becket, NP. We discussed all aspects of the encounter. I agree with the assessment and plan as stated above. Echo reviewed with her in clinic - parameters are stable. Continue Herceptin. Appears to have URI and probable underlying COPD. Will get CXR at Dr. Kathi Der office and also schedule PFTs.

## 2011-03-08 NOTE — Patient Instructions (Addendum)
Follow up in 3 months with ECHO  Please obtain PFTs  Obtain Chest X-Ray at Dr Buel Ream office.

## 2011-03-09 ENCOUNTER — Encounter (INDEPENDENT_AMBULATORY_CARE_PROVIDER_SITE_OTHER): Payer: Self-pay | Admitting: Surgery

## 2011-03-09 DIAGNOSIS — Z79811 Long term (current) use of aromatase inhibitors: Secondary | ICD-10-CM

## 2011-03-09 HISTORY — DX: Long term (current) use of aromatase inhibitors: Z79.811

## 2011-03-11 DIAGNOSIS — R0602 Shortness of breath: Secondary | ICD-10-CM | POA: Insufficient documentation

## 2011-03-11 NOTE — Assessment & Plan Note (Signed)
Check CXR and PFTs.

## 2011-03-16 ENCOUNTER — Encounter (INDEPENDENT_AMBULATORY_CARE_PROVIDER_SITE_OTHER): Payer: Self-pay | Admitting: Surgery

## 2011-03-16 ENCOUNTER — Ambulatory Visit (INDEPENDENT_AMBULATORY_CARE_PROVIDER_SITE_OTHER): Payer: Commercial Managed Care - PPO | Admitting: Surgery

## 2011-03-16 VITALS — BP 132/90 | HR 84 | Resp 16 | Ht 64.5 in | Wt 161.0 lb

## 2011-03-16 DIAGNOSIS — Z853 Personal history of malignant neoplasm of breast: Secondary | ICD-10-CM

## 2011-03-16 NOTE — Progress Notes (Signed)
NAME: Sheila Garrison       DOB: 02/06/1955           DATE: 03/16/2011       MRN: [id number]   Sheila Garrison is a 56 y.o.Marland Kitchenfemale who presents for routine followup of her Stage III Right breast cancer diagnosed in 2012 and treated with neoadjuvant chemo, mastectomy, radiation, herceptin. She has no problems or concerns on either side.  PFSH: She has had no significant changes since the last visit here.  ROS: There have been no significant changes since the last visit here  EXAM: General: The patient is alert, oriented, generally healty appearing, NAD. Mood and affect are normal.  Breasts:  Right side shows good healing from mastectomy with no areas of concern.Slight increased pigmentation from the rads  Lymphatics: She has no axillary or supraclavicular adenopathy on either side.  Extremities: Full ROM of the surgical side with no lymphedema noted.  Data Reviewed: No new  Impression: Doing well, with no evidence of recurrent cancer or new cancer  Plan: Will continue to follow up in six months. She will call if the port can be removed when she finishes herceptin.

## 2011-03-16 NOTE — Patient Instructions (Signed)
If your port can come out this summer call and we will schedule it to be removed under local anesthesia

## 2011-03-21 ENCOUNTER — Ambulatory Visit (HOSPITAL_COMMUNITY)
Admission: RE | Admit: 2011-03-21 | Discharge: 2011-03-21 | Disposition: A | Discharge status: Home / Self Care | Payer: 59 | Source: Ambulatory Visit | Attending: Adult Health | Admitting: Adult Health

## 2011-03-21 DIAGNOSIS — C50119 Malignant neoplasm of central portion of unspecified female breast: Secondary | ICD-10-CM

## 2011-03-21 DIAGNOSIS — R0602 Shortness of breath: Secondary | ICD-10-CM | POA: Insufficient documentation

## 2011-03-21 MED ORDER — ALBUTEROL SULFATE (5 MG/ML) 0.5% IN NEBU
2.5000 mg | INHALATION_SOLUTION | Freq: Once | RESPIRATORY_TRACT | Status: AC
  Administered 2011-03-21: 2.5 mg via RESPIRATORY_TRACT

## 2011-03-21 NOTE — Telephone Encounter (Signed)
Left msg

## 2011-03-26 NOTE — Procedures (Signed)
Sheila Garrison, Sheila Garrison               ACCOUNT NO.:  [id number]  MEDICAL RECORD NO.:  [id number]  LOCATION:  RESP                          FACILITY:  [facility]  PHYSICIAN:  Zyeir Dymek L. Juanetta Gosling, M.D.DATE OF BIRTH:  [birth year]  DATE OF PROCEDURE: DATE OF DISCHARGE:  03/21/2011                           PULMONARY FUNCTION TEST   Reason for pulmonary function testing is shortness of breath. 1. Spirometry shows a severe ventilatory defect with evidence of     airflow obstruction. 2. Lung volumes show air trapping. 3. DLCO is severely moderately reduced, but improved somewhat when     corrected for ventilation. 4. Airway resistance is slightly elevated. 5. There is improvement with inhaled bronchodilator approaches, but     does not reach the level of significance.     Jaslynn Thome L. Juanetta Gosling, M.D.     ELH/MEDQ  D:  03/25/2011  T:  03/26/2011  Job:  [id number]

## 2011-03-27 ENCOUNTER — Ambulatory Visit (HOSPITAL_COMMUNITY): Payer: 59 | Admitting: Oncology

## 2011-03-27 ENCOUNTER — Encounter (HOSPITAL_BASED_OUTPATIENT_CLINIC_OR_DEPARTMENT_OTHER): Payer: 59

## 2011-03-27 ENCOUNTER — Encounter (HOSPITAL_COMMUNITY): Payer: 59 | Attending: Oncology | Admitting: Oncology

## 2011-03-27 DIAGNOSIS — C50119 Malignant neoplasm of central portion of unspecified female breast: Secondary | ICD-10-CM

## 2011-03-27 DIAGNOSIS — C773 Secondary and unspecified malignant neoplasm of axilla and upper limb lymph nodes: Secondary | ICD-10-CM

## 2011-03-27 DIAGNOSIS — J449 Chronic obstructive pulmonary disease, unspecified: Secondary | ICD-10-CM

## 2011-03-27 DIAGNOSIS — E876 Hypokalemia: Secondary | ICD-10-CM | POA: Insufficient documentation

## 2011-03-27 DIAGNOSIS — Z5112 Encounter for antineoplastic immunotherapy: Secondary | ICD-10-CM

## 2011-03-27 DIAGNOSIS — R0602 Shortness of breath: Secondary | ICD-10-CM

## 2011-03-27 MED ORDER — LETROZOLE 2.5 MG PO TABS: 2.5000 mg | ORAL_TABLET | Freq: Every day | ORAL | Status: AC

## 2011-03-27 MED ORDER — HEPARIN SOD (PORK) LOCK FLUSH 100 UNIT/ML IV SOLN
INTRAVENOUS | Status: AC
  Administered 2011-03-27: 500 [IU]
  Filled 2011-03-27: qty 5

## 2011-03-27 MED ORDER — HEPARIN SOD (PORK) LOCK FLUSH 100 UNIT/ML IV SOLN
500.0000 [IU] | Freq: Once | INTRAVENOUS | Status: AC | PRN
  Administered 2011-03-27: 500 [IU]
  Filled 2011-03-27: qty 5

## 2011-03-27 MED ORDER — TRASTUZUMAB CHEMO INJECTION 440 MG
6.0000 mg/kg | Freq: Once | INTRAVENOUS | Status: AC
  Administered 2011-03-27: 462 mg via INTRAVENOUS
  Filled 2011-03-27: qty 22

## 2011-03-27 MED ORDER — SODIUM CHLORIDE 0.9 % IV SOLN
Freq: Once | INTRAVENOUS | Status: AC
  Administered 2011-03-27: 14:00:00 via INTRAVENOUS

## 2011-03-27 MED ORDER — POTASSIUM CHLORIDE CRYS ER 20 MEQ PO TBCR: 20.0000 meq | EXTENDED_RELEASE_TABLET | Freq: Two times a day (BID) | ORAL | Status: DC

## 2011-03-27 NOTE — Patient Instructions (Addendum)
Sheila Garrison  [id number] [birth year]   [facility]  Discharge Instructions  RECOMMENDATIONS MADE BY THE CONSULTANT AND ANY TEST RESULTS WILL BE SENT TO YOUR REFERRING DOCTOR.   EXAM FINDINGS BY MD TODAY AND SIGNS AND SYMPTOMS TO REPORT TO CLINIC OR PRIMARY MD: you are doing well.  Will cancel the Echocardiogram that is scheduled for 3/11.  MEDICATIONS PRESCRIBED: MD to discuss with pharmacy   INSTRUCTIONS GIVEN AND DISCUSSED: Other :  Report any new lumps, bone pain or shortness of breath.  SPECIAL INSTRUCTIONS/FOLLOW-UP: Return to Clinic to see MD in 3 months.   I acknowledge that I have been informed and understand all the instructions given to me and received a copy. I do not have any more questions at this time, but understand that I may call the Specialty Clinic at [facility] at [phone] during business hours should I have any further questions or need assistance in obtaining follow-up care.    __________________________________________  _____________  __________ Signature of Patient or Authorized Representative            Date                   Time    __________________________________________ Nurse's Signature

## 2011-03-27 NOTE — Progress Notes (Signed)
CC:   Ernestina Penna, M.D. Sheila Buckles. Bensimhon, MD Currie Paris, M.D. Grayland Jack, M.D.  DIAGNOSES: 1. Stage IIIC (T4 N3) cancer of the right breast and she was N3     because of the clinical involvement of the axillary nodes, as well     as internal mammary nodes and suspicious ipsilateral     supraclavicular lymph node. 2. Chronic obstructive pulmonary disease, having stopped 1 year ago     and 1 month ago. 3. Tubal ligation. 4. History of dilatation and curettage. 5. Chronic shortness of breath with recent PFTs, but those results are     not in the computer yet, but her 2D echo shows an adequate ejection     fraction and heart function. She has done very well.  She is presently on her letrozole 2.5 mg which she started on 03/09/2011 and she is having no side effects yet from that.  She is on trastuzumab and treatment today is 15 out of 17, so she has 2 more treatments after today.  She saw Dr. Gala Romney and had her 2D echo which was okay, but told him she was little short-winded at times, especially when it is cold, she states, so PFTs were ordered.  I suspect they will be very consistent with emphysematous changes and obstructive lung disease, but they are not back yet.  They have been done, but they have not been read.  I have refilled her Klor-Con today and she needs to take that as long as she is on a diuretic, I suspect and I have gotten her a new prescription for letrozole through the [facility] outpatient pharmacy.  She will call them and take care of the billing and tell them where to deliver it, namely here at [facility].  REVIEW OF SYSTEMS:  Other than that, her review of systems oncologically is negative.  PHYSICAL EXAMINATION:  Vital Signs:  She has stable vital signs.  Her respirations are 16 to 18 and unlabored today.  She has hyperresonance to percussion and markedly diminished breath sounds in the lower half of the lungs and decreased breath  sounds of coarse in the upper half as well.  Her weight is 159 pounds, which is stable.  She has a blood pressure 146/74 in left arm sitting position.  Pulse 80 and regular. She is afebrile and not in any pain.  Her PO2 today is 92% on room air. Lymph Nodes:  Negative throughout, including cervical, supraclavicular, infraclavicular, axillary, and inguinal areas.  Abdomen:  She has no hepatosplenomegaly.  Heart:  Shows a regular rhythm and rate.  I did not hear an S3 gallop or murmur today.  Breasts:  The right chest wall shows the mastectomy of course and the irritation and mild red changes from the radiation.  The left breast is negative.  The port is intact. Extremities:  She has no leg edema and no arm edema.  So Sheila Garrison has done the best thing she could by herself by quitting smoking.  I have encouraged her really to keep away from the cigarettes. Her husband still smokes, but does not smoke around her.  So we will see her back in 12 weeks and she has 5 more treatments.  She does need 1-2 more 2D echos from our standpoint, one due at the end of April, then one after completion of chemotherapy and she states Dr. Gala Romney will be doing that.    ______________________________ Sheila Horns. Mariel Sleet, MD ESN/MEDQ  D:  03/27/2011  T:  03/27/2011  Job:  [id number]

## 2011-03-27 NOTE — Progress Notes (Signed)
This office note has been dictated.

## 2011-03-28 ENCOUNTER — Telehealth (HOSPITAL_COMMUNITY): Payer: Self-pay

## 2011-03-28 NOTE — Telephone Encounter (Signed)
Msg left to call clinic if has any problems, questions or concerns.

## 2011-04-16 ENCOUNTER — Other Ambulatory Visit (HOSPITAL_COMMUNITY): Payer: 59

## 2011-04-17 ENCOUNTER — Encounter (HOSPITAL_COMMUNITY): Payer: 59 | Attending: Oncology

## 2011-04-17 ENCOUNTER — Other Ambulatory Visit (HOSPITAL_COMMUNITY): Payer: Self-pay | Admitting: Oncology

## 2011-04-17 ENCOUNTER — Inpatient Hospital Stay (HOSPITAL_COMMUNITY): Payer: 59

## 2011-04-17 VITALS — BP 111/69 | HR 80 | Temp 97.8°F | Wt 161.0 lb

## 2011-04-17 DIAGNOSIS — C50419 Malignant neoplasm of upper-outer quadrant of unspecified female breast: Secondary | ICD-10-CM | POA: Insufficient documentation

## 2011-04-17 DIAGNOSIS — Z5112 Encounter for antineoplastic immunotherapy: Secondary | ICD-10-CM

## 2011-04-17 DIAGNOSIS — C773 Secondary and unspecified malignant neoplasm of axilla and upper limb lymph nodes: Secondary | ICD-10-CM

## 2011-04-17 DIAGNOSIS — C50119 Malignant neoplasm of central portion of unspecified female breast: Secondary | ICD-10-CM | POA: Insufficient documentation

## 2011-04-17 LAB — DIFFERENTIAL
Basophils Relative: 0 % (ref 0–1)
Eosinophils Absolute: 0.3 10*3/uL (ref 0.0–0.7)
Lymphs Abs: 1.6 10*3/uL (ref 0.7–4.0)
Neutrophils Relative %: 59 % (ref 43–77)

## 2011-04-17 LAB — COMPREHENSIVE METABOLIC PANEL
ALT: 13 U/L (ref 0–35)
Albumin: 3.6 g/dL (ref 3.5–5.2)
Alkaline Phosphatase: 63 U/L (ref 39–117)
Glucose, Bld: 112 mg/dL — ABNORMAL HIGH (ref 70–99)
Potassium: 3.2 mEq/L — ABNORMAL LOW (ref 3.5–5.1)
Sodium: 141 mEq/L (ref 135–145)
Total Protein: 6.6 g/dL (ref 6.0–8.3)

## 2011-04-17 LAB — CBC
MCH: 30.7 pg (ref 26.0–34.0)
Platelets: 252 10*3/uL (ref 150–400)
RBC: 4.04 MIL/uL (ref 3.87–5.11)

## 2011-04-17 MED ORDER — SODIUM CHLORIDE 0.9 % IV SOLN
Freq: Once | INTRAVENOUS | Status: AC
  Administered 2011-04-17: 15:00:00 via INTRAVENOUS

## 2011-04-17 MED ORDER — HEPARIN SOD (PORK) LOCK FLUSH 100 UNIT/ML IV SOLN
INTRAVENOUS | Status: AC
  Administered 2011-04-17: 500 [IU]
  Filled 2011-04-17: qty 5

## 2011-04-17 MED ORDER — TRASTUZUMAB CHEMO INJECTION 440 MG
6.0000 mg/kg | Freq: Once | INTRAVENOUS | Status: AC
  Administered 2011-04-17: 462 mg via INTRAVENOUS
  Filled 2011-04-17: qty 22

## 2011-04-17 MED ORDER — HEPARIN SOD (PORK) LOCK FLUSH 100 UNIT/ML IV SOLN
500.0000 [IU] | Freq: Once | INTRAVENOUS | Status: AC | PRN
  Administered 2011-04-17: 500 [IU]
  Filled 2011-04-17: qty 5

## 2011-04-17 NOTE — Progress Notes (Signed)
Tolerated well

## 2011-05-05 ENCOUNTER — Encounter: Payer: Self-pay | Admitting: Internal Medicine

## 2011-05-08 ENCOUNTER — Encounter (HOSPITAL_COMMUNITY): Payer: 59 | Attending: Oncology

## 2011-05-08 ENCOUNTER — Inpatient Hospital Stay (HOSPITAL_COMMUNITY): Payer: 59

## 2011-05-08 VITALS — BP 111/57 | HR 77 | Temp 97.0°F

## 2011-05-08 DIAGNOSIS — C50119 Malignant neoplasm of central portion of unspecified female breast: Secondary | ICD-10-CM | POA: Insufficient documentation

## 2011-05-08 DIAGNOSIS — C773 Secondary and unspecified malignant neoplasm of axilla and upper limb lymph nodes: Secondary | ICD-10-CM

## 2011-05-08 DIAGNOSIS — Z5112 Encounter for antineoplastic immunotherapy: Secondary | ICD-10-CM

## 2011-05-08 MED ORDER — TRASTUZUMAB CHEMO INJECTION 440 MG
6.0000 mg/kg | Freq: Once | INTRAVENOUS | Status: AC
  Administered 2011-05-08: 462 mg via INTRAVENOUS
  Filled 2011-05-08: qty 22

## 2011-05-08 MED ORDER — HEPARIN SOD (PORK) LOCK FLUSH 100 UNIT/ML IV SOLN
INTRAVENOUS | Status: AC
  Filled 2011-05-08: qty 5

## 2011-05-08 MED ORDER — SODIUM CHLORIDE 0.9 % IJ SOLN
INTRAMUSCULAR | Status: AC
  Filled 2011-05-08: qty 10

## 2011-05-08 MED ORDER — SODIUM CHLORIDE 0.9 % IV SOLN
Freq: Once | INTRAVENOUS | Status: AC
  Administered 2011-05-08: 500 mL via INTRAVENOUS

## 2011-05-08 MED ORDER — HEPARIN SOD (PORK) LOCK FLUSH 100 UNIT/ML IV SOLN
500.0000 [IU] | Freq: Once | INTRAVENOUS | Status: AC | PRN
  Administered 2011-05-08: 500 [IU]
  Filled 2011-05-08: qty 5

## 2011-05-08 NOTE — Progress Notes (Signed)
Sheila Garrison tolerated infusion well and without incident; verbalizes understanding for follow-up.  No distress noted at time of discharge and patient was discharged home by herself.  Pt's next echo scheduled and pt aware of the appt.

## 2011-05-08 NOTE — Progress Notes (Signed)
Pt reports she took Tylenol and benadryl apx 45 mins ago prior to coming to her infusion appointment.

## 2011-05-29 ENCOUNTER — Ambulatory Visit (HOSPITAL_COMMUNITY)
Admission: RE | Admit: 2011-05-29 | Discharge: 2011-05-29 | Disposition: A | Discharge status: Home / Self Care | Payer: 59 | Source: Ambulatory Visit | Attending: Adult Health | Admitting: Adult Health

## 2011-05-29 ENCOUNTER — Inpatient Hospital Stay (HOSPITAL_COMMUNITY): Payer: 59

## 2011-05-29 ENCOUNTER — Encounter (HOSPITAL_BASED_OUTPATIENT_CLINIC_OR_DEPARTMENT_OTHER): Payer: 59

## 2011-05-29 VITALS — BP 119/79 | HR 65 | Temp 97.7°F | Ht 64.5 in | Wt 159.0 lb

## 2011-05-29 DIAGNOSIS — C50119 Malignant neoplasm of central portion of unspecified female breast: Secondary | ICD-10-CM

## 2011-05-29 DIAGNOSIS — C50919 Malignant neoplasm of unspecified site of unspecified female breast: Secondary | ICD-10-CM | POA: Insufficient documentation

## 2011-05-29 DIAGNOSIS — Z9221 Personal history of antineoplastic chemotherapy: Secondary | ICD-10-CM | POA: Insufficient documentation

## 2011-05-29 DIAGNOSIS — Z09 Encounter for follow-up examination after completed treatment for conditions other than malignant neoplasm: Secondary | ICD-10-CM

## 2011-05-29 DIAGNOSIS — I10 Essential (primary) hypertension: Secondary | ICD-10-CM | POA: Insufficient documentation

## 2011-05-29 DIAGNOSIS — Z5112 Encounter for antineoplastic immunotherapy: Secondary | ICD-10-CM

## 2011-05-29 DIAGNOSIS — C773 Secondary and unspecified malignant neoplasm of axilla and upper limb lymph nodes: Secondary | ICD-10-CM

## 2011-05-29 MED ORDER — SODIUM CHLORIDE 0.9 % IV SOLN
Freq: Once | INTRAVENOUS | Status: AC
  Administered 2011-05-29: 15:00:00 via INTRAVENOUS

## 2011-05-29 MED ORDER — HEPARIN SOD (PORK) LOCK FLUSH 100 UNIT/ML IV SOLN
INTRAVENOUS | Status: AC
  Filled 2011-05-29: qty 5

## 2011-05-29 MED ORDER — HEPARIN SOD (PORK) LOCK FLUSH 100 UNIT/ML IV SOLN
500.0000 [IU] | Freq: Once | INTRAVENOUS | Status: AC | PRN
  Administered 2011-05-29: 500 [IU]
  Filled 2011-05-29: qty 5

## 2011-05-29 MED ORDER — SODIUM CHLORIDE 0.9 % IJ SOLN
10.0000 mL | INTRAMUSCULAR | Status: DC | PRN
  Administered 2011-05-29: 10 mL
  Filled 2011-05-29: qty 10

## 2011-05-29 MED ORDER — SODIUM CHLORIDE 0.9 % IJ SOLN
INTRAMUSCULAR | Status: AC
  Filled 2011-05-29: qty 10

## 2011-05-29 MED ORDER — TRASTUZUMAB CHEMO INJECTION 440 MG
6.0000 mg/kg | Freq: Once | INTRAVENOUS | Status: AC
  Administered 2011-05-29: 462 mg via INTRAVENOUS
  Filled 2011-05-29: qty 22

## 2011-05-29 NOTE — Progress Notes (Signed)
*  PRELIMINARY RESULTS* Echocardiogram 2D Echocardiogram has been performed.  Conrad Miner 05/29/2011, 1:07 PM

## 2011-05-29 NOTE — Progress Notes (Signed)
Pt arrived for herceptin. 2 d echo performed just prior to arrival. Results not available today. Last EF 60% on 03/08/2011. Dr. Mariel Sleet aware and gave ok to proceed with today;s dose.

## 2011-06-11 ENCOUNTER — Ambulatory Visit (HOSPITAL_COMMUNITY)
Admission: RE | Admit: 2011-06-11 | Discharge: 2011-06-11 | Disposition: A | Discharge status: Home / Self Care | Payer: 59 | Source: Ambulatory Visit | Attending: Internal Medicine | Admitting: Internal Medicine

## 2011-06-11 VITALS — BP 112/70 | HR 80 | Wt 160.8 lb

## 2011-06-11 DIAGNOSIS — Z79899 Other long term (current) drug therapy: Secondary | ICD-10-CM | POA: Insufficient documentation

## 2011-06-11 DIAGNOSIS — Z9221 Personal history of antineoplastic chemotherapy: Secondary | ICD-10-CM | POA: Insufficient documentation

## 2011-06-11 DIAGNOSIS — Z9010 Acquired absence of unspecified breast and nipple: Secondary | ICD-10-CM | POA: Insufficient documentation

## 2011-06-11 DIAGNOSIS — R0602 Shortness of breath: Secondary | ICD-10-CM | POA: Insufficient documentation

## 2011-06-11 DIAGNOSIS — Z803 Family history of malignant neoplasm of breast: Secondary | ICD-10-CM | POA: Insufficient documentation

## 2011-06-11 DIAGNOSIS — I10 Essential (primary) hypertension: Secondary | ICD-10-CM | POA: Insufficient documentation

## 2011-06-11 DIAGNOSIS — C50119 Malignant neoplasm of central portion of unspecified female breast: Secondary | ICD-10-CM | POA: Insufficient documentation

## 2011-06-11 NOTE — Assessment & Plan Note (Signed)
Attending: PFTs reviewed with her. Show significant COPD. Will refer to Dr. Juanetta Gosling for f/u.

## 2011-06-11 NOTE — Progress Notes (Addendum)
HPI:  56 year old with carcinoma, right breast, stage III, status post neoadjuvant chemotherapy, S/P modified R mastectomy. Triple positive breast cancer. Herceptin was started and she will be completed in June 2013. She has received 6 cycles of carboplatin, docetaxel, and trastuzumab. She resumed chemo status post modified mastectomy 11/6 at [facility]. She will be followed by Dr Jerelyn Scott.   ECHO 06/2010 EF 60% lateral s prime velocity 11.2  ECHO 10/11/2010 EF 60% lateral s prime velocity 10.4  ECHO 12/19/10 EF 55-60% lateral s prime velocity 10.5 ECHO 03/08/11 EF 55% Lateral S' 10. Echo 05/29/11 EF 60-65% lateral s' - not done  PFTs 03/2011: Spirometry shows a severe ventilatory defect with evidence of  airflow obstruction.  Lung volumes show air trapping.  DLCO is severely moderately reduced, but improved somewhat when corrected for ventilation.  Airway resistance is slightly elevated.  There is improvement with inhaled bronchodilator approaches, but does not reach the level of significance.  Here for routine follow up today.  She feels great today.  Occasional shortness of breath ~1 week after herceptin therapy.  No edema.  No orthopnea/PND.  Feels her last herceptin treatment is in June.  No chest pain.     ROS: All systems negative except as listed in HPI, PMH and Problem List.  Past Medical History  Diagnosis Date  . Hypertension   . Nipple discharge     with pain, infection, lump  . MRSA (methicillin resistant staph aureus) culture positive     in breast  . Wears dentures   . Breast CA 07/01/2010    rt breast ca  . Family history of breast cancer     Aunt on father's side  . Neuropathy     bilateral toes  . Hyperlipemia     Current Outpatient Prescriptions  Medication Sig Dispense Refill  . Cholecalciferol (VITAMIN D-3 PO) Take by mouth.        . diphenhydrAMINE (BENADRYL) 25 MG tablet Take 25 mg by mouth every 6 (six) hours as needed.        . fexofenadine (ALLEGRA)  180 MG tablet Take 180 mg by mouth daily.        . Lansoprazole (PREVACID PO) Take by mouth daily.        Marland Kitchen letrozole (FEMARA) 2.5 MG tablet Take 2.5 mg by mouth daily.      Marland Kitchen lidocaine-prilocaine (EMLA) cream Apply topically as needed.        Marland Kitchen LORazepam (ATIVAN) 0.5 MG tablet Take 0.5 mg by mouth as needed.        Marland Kitchen losartan-hydrochlorothiazide (HYZAAR) 100-12.5 MG per tablet Take 1 tablet by mouth daily.        . Multiple Vitamin (MULTIVITAMIN) capsule Take 1 capsule by mouth daily.        . naproxen sodium (ANAPROX) 220 MG tablet Take 220 mg by mouth as needed.        . potassium chloride SA (K-DUR,KLOR-CON) 20 MEQ tablet Take 1 tablet (20 mEq total) by mouth 2 (two) times daily.  180 tablet  3  . rosuvastatin (CRESTOR) 10 MG tablet Take 10 mg by mouth daily.        Marland Kitchen DISCONTD: potassium chloride SA (K-DUR,KLOR-CON) 20 MEQ tablet Take 1 tablet (20 mEq total) by mouth 3 (three) times daily.  100 tablet  3     PHYSICAL EXAM: Filed Vitals:   06/11/11 0914  BP: 112/70  Pulse: 80  Weight: 160 lb 12 oz (72.916 kg)  SpO2: 91%   General:  Well appearing. No resp difficulty HEENT: normal Neck: supple. JVP flat. Carotids 2+ bilaterally; no bruits. No lymphadenopathy or thryomegaly appreciated. Cor: PMI normal. Regular rate & rhythm. No rubs, gallops or murmurs. Lungs:  decreased in bases.  Abdomen: soft, nontender, nondistended. No hepatosplenomegaly. No bruits or masses. Good bowel sounds. Extremities: no cyanosis, clubbing, rash, edema Neuro: alert & orientedx3, cranial nerves grossly intact. Moves all 4 extremities w/o difficulty. Affect pleasant.   ASSESSMENT & PLAN:

## 2011-06-11 NOTE — Patient Instructions (Addendum)
Continue current medications.    Your physician has requested that you have an echocardiogram. Echocardiography is a painless test that uses sound waves to create images of your heart. It provides your doctor with information about the size and shape of your heart and how well your heart's chambers and valves are working. This procedure takes approximately one hour. There are no restrictions for this procedure.  IN 3 MONTHS

## 2011-06-11 NOTE — Assessment & Plan Note (Addendum)
Echo reviewed and tolerating herceptin well.  Will continue current therapies.  Follow up in 2-3 months after last herceptin treatment.    Patient seen and examined with Ulyess Blossom, PA-C. We discussed all aspects of the encounter. I agree with the assessment and plan as stated above. I reviewed echo personally. EF looks stable. Tissue Doppler not done. Can continue Herceptin. Will need one more echo post-therapy in June.

## 2011-06-19 ENCOUNTER — Encounter (HOSPITAL_COMMUNITY): Payer: 59 | Attending: Oncology

## 2011-06-19 ENCOUNTER — Encounter (HOSPITAL_BASED_OUTPATIENT_CLINIC_OR_DEPARTMENT_OTHER): Payer: 59 | Admitting: Oncology

## 2011-06-19 ENCOUNTER — Encounter (HOSPITAL_COMMUNITY): Payer: Self-pay

## 2011-06-19 ENCOUNTER — Inpatient Hospital Stay (HOSPITAL_COMMUNITY): Payer: 59

## 2011-06-19 VITALS — BP 126/75 | HR 71 | Temp 97.6°F | Wt 157.8 lb

## 2011-06-19 DIAGNOSIS — Z5112 Encounter for antineoplastic immunotherapy: Secondary | ICD-10-CM

## 2011-06-19 DIAGNOSIS — C773 Secondary and unspecified malignant neoplasm of axilla and upper limb lymph nodes: Secondary | ICD-10-CM

## 2011-06-19 DIAGNOSIS — C50419 Malignant neoplasm of upper-outer quadrant of unspecified female breast: Secondary | ICD-10-CM | POA: Insufficient documentation

## 2011-06-19 DIAGNOSIS — J449 Chronic obstructive pulmonary disease, unspecified: Secondary | ICD-10-CM

## 2011-06-19 DIAGNOSIS — C50119 Malignant neoplasm of central portion of unspecified female breast: Secondary | ICD-10-CM

## 2011-06-19 LAB — DIFFERENTIAL
Eosinophils Absolute: 0.3 10*3/uL (ref 0.0–0.7)
Eosinophils Relative: 5 % (ref 0–5)
Lymphs Abs: 1.9 10*3/uL (ref 0.7–4.0)
Monocytes Relative: 10 % (ref 3–12)

## 2011-06-19 LAB — CBC
Hemoglobin: 12.6 g/dL (ref 12.0–15.0)
MCH: 31.1 pg (ref 26.0–34.0)
MCV: 96 fL (ref 78.0–100.0)
RBC: 4.05 MIL/uL (ref 3.87–5.11)

## 2011-06-19 LAB — COMPREHENSIVE METABOLIC PANEL
BUN: 12 mg/dL (ref 6–23)
Calcium: 9.4 mg/dL (ref 8.4–10.5)
Creatinine, Ser: 0.7 mg/dL (ref 0.50–1.10)
GFR calc Af Amer: >90 mL/min (ref >90)
Glucose, Bld: 114 mg/dL — ABNORMAL HIGH (ref 70–99)
Total Protein: 6.9 g/dL (ref 6.0–8.3)

## 2011-06-19 MED ORDER — ACETAMINOPHEN 325 MG PO TABS: 650.0000 mg | ORAL_TABLET | Freq: Once | ORAL | Status: DC

## 2011-06-19 MED ORDER — DIPHENHYDRAMINE HCL 25 MG PO CAPS: 50.0000 mg | ORAL_CAPSULE | Freq: Once | ORAL | Status: DC

## 2011-06-19 MED ORDER — HEPARIN SOD (PORK) LOCK FLUSH 100 UNIT/ML IV SOLN
500.0000 [IU] | Freq: Once | INTRAVENOUS | Status: AC | PRN
  Administered 2011-06-19: 500 [IU]
  Filled 2011-06-19: qty 5

## 2011-06-19 MED ORDER — HEPARIN SOD (PORK) LOCK FLUSH 100 UNIT/ML IV SOLN
INTRAVENOUS | Status: AC
  Filled 2011-06-19: qty 5

## 2011-06-19 MED ORDER — SODIUM CHLORIDE 0.9 % IJ SOLN
10.0000 mL | INTRAMUSCULAR | Status: DC | PRN
  Administered 2011-06-19: 10 mL
  Filled 2011-06-19: qty 10

## 2011-06-19 MED ORDER — SODIUM CHLORIDE 0.9 % IJ SOLN
INTRAMUSCULAR | Status: AC
  Filled 2011-06-19: qty 10

## 2011-06-19 MED ORDER — SODIUM CHLORIDE 0.9 % IV SOLN
Freq: Once | INTRAVENOUS | Status: AC
  Administered 2011-06-19: 500 mL via INTRAVENOUS

## 2011-06-19 MED ORDER — TRASTUZUMAB CHEMO INJECTION 440 MG
6.0000 mg/kg | Freq: Once | INTRAVENOUS | Status: AC
  Administered 2011-06-19: 462 mg via INTRAVENOUS
  Filled 2011-06-19: qty 22

## 2011-06-19 NOTE — Progress Notes (Signed)
Sheila Heap, MD, MD [address] [place] [place] [address]  1. Stage III (T4N3) R Br cancer      CURRENT THERAPY: S/P 16 cycles of Herceptin and now on Letrozole.  INTERVAL HISTORY: Sheila Garrison 56 y.o. 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  She is S/P 6 cycles Docetaxel/carboplatin/trastuzumab every 21 days, S/P radiation, and now S/P 16 cycles of Herceptin, and presently on Letrozole 2.5 mg daily.  She started her Herceptin on 07/18/2010 according to Sheila Scale, PA-C's (Dr. Darrall Dears PA) note on 07/07/2010.  The patient is doing very well.  She has been getting her 2D echo by Dr. Gala Garrison.  She is S/P PFTs and according to the patient this revealed potential COPD.  She has been referred to Dr. Juanetta Garrison for further evaluation of this.  The patient has continued to remain tobacco free since quitting.    Oncologically, the patient is doing very well. She is tolerating the Letrozole well without any difficulties.  She does admit to minimal myalgias, but these are minor and she is willing to continue with this therapy.  She understands that hot flashes, myalgias, and arthralgias are a categorical side effect of the AIs.   We spent some time discussing the patient's future regimen.  I pulled her paper chart to verify her correct cycle of Herceptin.  She did indeed start in June 2012 and therefore she only has one more cycle.  She will continue with the Letrozole for 5 years.  Past Medical History  Diagnosis Date  . Hypertension   . Nipple discharge     with pain, infection, lump  . MRSA (methicillin resistant staph aureus) culture positive     in breast  . Wears dentures   . Breast CA 07/01/2010    rt breast ca  . Family history of breast  cancer     Aunt on father's side  . Neuropathy     bilateral toes  . Hyperlipemia     has Stage III (T4N3) R Br cancer ; BP (high blood pressure); High cholesterol; Malignant neoplasm of upper-outer quadrant of female breast; and Shortness of breath on her problem list.      has no known allergies.  [name] had no medications administered during this visit.  Past Surgical History  Procedure Date  . Tubal ligation   . Dilation and curettage of uterus   . Breast surgery 2012    Denies any headaches, dizziness, double vision, fevers, chills, night sweats, nausea, vomiting, diarrhea, constipation, chest pain, heart palpitations, shortness of breath, blood in stool, black tarry stool, urinary pain, urinary burning, urinary frequency, hematuria.   PHYSICAL EXAMINATION  ECOG PERFORMANCE STATUS: 0 - Asymptomatic  There were no vitals filed for this visit.  GENERAL:alert, no distress, well nourished, well developed, comfortable, cooperative and smiling SKIN: skin color, texture, turgor are normal, no rashes or significant lesions HEAD: Normocephalic, No masses, lesions, tenderness or abnormalities EYES: normal, EOMI, Conjunctiva are pink and non-injected EARS: External ears normal OROPHARYNX:lips, buccal mucosa, and tongue normal and mucous membranes are moist  NECK: supple, no adenopathy, thyroid normal size, non-tender, without nodularity, no stridor, non-tender, trachea midline LYMPH:  no palpable lymphadenopathy, no hepatosplenomegaly BREAST:left breast normal without mass, skin or  nipple changes or axillary nodes, right post-mastectomy site well healed and free of suspicious changes LUNGS: clear to auscultation and percussion, decreased breath sounds HEART: regular rate & rhythm, no murmurs, no gallops, S1 normal and S2 normal ABDOMEN:abdomen soft, non-tender, obese, normal bowel sounds, no masses or organomegaly and no hepatosplenomegaly BACK: Back symmetric, no curvature., No  CVA tenderness EXTREMITIES:less then 2 second capillary refill, no joint deformities, effusion, or inflammation, no edema, no skin discoloration, no clubbing, no cyanosis  NEURO: alert & oriented x 3 with fluent speech, no focal motor/sensory deficits, gait normal    LABORATORY DATA: CBC    Component Value Date/Time   WBC 6.7 06/19/2011 1408   WBC 4.7 11/07/2010 0917   RBC 4.05 06/19/2011 1408   RBC 3.10* 11/07/2010 0917   HGB 12.6 06/19/2011 1408   HGB 10.1* 11/07/2010 0917   HCT 38.9 06/19/2011 1408   HCT 31.1* 11/07/2010 0917   PLT 273 06/19/2011 1408   PLT 194 11/07/2010 0917   MCV 96.0 06/19/2011 1408   MCV 100.3 11/07/2010 0917   MCH 31.1 06/19/2011 1408   MCH 32.6 11/07/2010 0917   MCHC 32.4 06/19/2011 1408   MCHC 32.5 11/07/2010 0917   RDW 15.3 06/19/2011 1408   RDW 18.3* 11/07/2010 0917   LYMPHSABS 1.9 06/19/2011 1408   LYMPHSABS 1.6 11/07/2010 0917   MONOABS 0.7 06/19/2011 1408   MONOABS 1.4* 11/07/2010 0917   EOSABS 0.3 06/19/2011 1408   EOSABS 0.0 11/07/2010 0917   BASOSABS 0.1 06/19/2011 1408   BASOSABS 0.0 11/07/2010 0917      Chemistry      Component Value Date/Time   NA 140 06/19/2011 1408   K 3.5 06/19/2011 1408   CL 102 06/19/2011 1408   CO2 27 06/19/2011 1408   BUN 12 06/19/2011 1408   CREATININE 0.70 06/19/2011 1408      Component Value Date/Time   CALCIUM 9.4 06/19/2011 1408   ALKPHOS 76 06/19/2011 1408   AST 21 06/19/2011 1408   ALT 14 06/19/2011 1408   BILITOT 0.1* 06/19/2011 1408        ASSESSMENT:  1. 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 She is S/P 6 cycles Docetaxel/carboplatin/trastuzumab every 21 days, S/P radiation, and now S/P 16 cycles of Herceptin, and presently on Letrozole 2.5 mg daily.  She started her Herceptin on 07/18/2010 according to Sheila Scale, PA-C's (Dr. Darrall Dears PA) note on  07/07/2010. 2. Chronic obstructive pulmonary disease, having stopped 1 year ago and 1 month ago.  3. Tubal ligation.  4. History of dilatation and curettage.  5. Chronic shortness of breath with recent PFTs, but those results are not in the computer yet, but her 2D echo shows an adequate ejection fraction and heart function.   PLAN:  1. I personally reviewed and went over laboratory results with the patient. 2. Reviewed patient's paper chart to delineate the correct date for which she started the Herceptin antibody. 3. Altered the Antibody plan to reflect the accurate number of cycles left, which is one more cycle.  4. Return in 3 months for follow-up.  She will complete her last cycle of herceptin in 21 days.  She will continue the Letrozole.   All questions were answered. The patient knows to call the clinic with any problems, questions or concerns. We can certainly see the patient much  sooner if necessary.  Sheila Garrison

## 2011-06-19 NOTE — Patient Instructions (Signed)
[facility]  Discharge Instructions  RECOMMENDATIONS MADE BY THE CONSULTANT AND ANY TEST RESULTS WILL BE SENT TO YOUR REFERRING DOCTOR.   SPECIAL INSTRUCTIONS/FOLLOW-UP: Return to Clinic in three months.  You will have one more cycle of the Herceptin.  Please see the front desk for appointments.     I acknowledge that I have been informed and understand all the instructions given to me and received a copy. I do not have any more questions at this time, but understand that I may call the Specialty Clinic at [facility] at [phone] during business hours should I have any further questions or need assistance in obtaining follow-up care.    __________________________________________  _____________  __________ Signature of Patient or Authorized Representative            Date                   Time    __________________________________________ Nurse's Signature

## 2011-06-19 NOTE — Progress Notes (Signed)
Sheila Garrison reports taking premeds, tylenol and benadryl, [facility] to [facility].  Sheila Garrison tolerated infusion well and without incident; verbalizes understanding for follow-up.  No distress noted at time of discharge and patient was discharged home by herself.

## 2011-06-26 ENCOUNTER — Ambulatory Visit (HOSPITAL_COMMUNITY): Payer: 59 | Admitting: Oncology

## 2011-06-27 ENCOUNTER — Ambulatory Visit (HOSPITAL_COMMUNITY): Payer: 59 | Admitting: Oncology

## 2011-07-10 ENCOUNTER — Encounter (HOSPITAL_COMMUNITY): Payer: 59 | Attending: Oncology

## 2011-07-10 ENCOUNTER — Inpatient Hospital Stay (HOSPITAL_COMMUNITY): Payer: 59

## 2011-07-10 VITALS — BP 132/74 | HR 89 | Temp 97.5°F | Wt 159.0 lb

## 2011-07-10 DIAGNOSIS — C773 Secondary and unspecified malignant neoplasm of axilla and upper limb lymph nodes: Secondary | ICD-10-CM

## 2011-07-10 DIAGNOSIS — C50119 Malignant neoplasm of central portion of unspecified female breast: Secondary | ICD-10-CM | POA: Insufficient documentation

## 2011-07-10 DIAGNOSIS — Z5112 Encounter for antineoplastic immunotherapy: Secondary | ICD-10-CM

## 2011-07-10 MED ORDER — TRASTUZUMAB CHEMO INJECTION 440 MG
6.0000 mg/kg | Freq: Once | INTRAVENOUS | Status: AC
  Administered 2011-07-10: 462 mg via INTRAVENOUS
  Filled 2011-07-10: qty 22

## 2011-07-10 MED ORDER — HEPARIN SOD (PORK) LOCK FLUSH 100 UNIT/ML IV SOLN
INTRAVENOUS | Status: AC
  Filled 2011-07-10: qty 5

## 2011-07-10 MED ORDER — HEPARIN SOD (PORK) LOCK FLUSH 100 UNIT/ML IV SOLN
500.0000 [IU] | Freq: Once | INTRAVENOUS | Status: AC
  Administered 2011-07-10: 500 [IU] via INTRAVENOUS
  Filled 2011-07-10: qty 5

## 2011-07-10 MED ORDER — SODIUM CHLORIDE 0.9 % IV SOLN
INTRAVENOUS | Status: DC
  Administered 2011-07-10: 15:00:00 via INTRAVENOUS

## 2011-07-10 MED ORDER — SODIUM CHLORIDE 0.9 % IJ SOLN
10.0000 mL | INTRAMUSCULAR | Status: DC | PRN
  Administered 2011-07-10: 10 mL via INTRAVENOUS
  Filled 2011-07-10: qty 10

## 2011-07-10 NOTE — Progress Notes (Signed)
Tolerated infusion well. 

## 2011-07-17 ENCOUNTER — Other Ambulatory Visit (HOSPITAL_COMMUNITY): Payer: 59

## 2011-08-21 ENCOUNTER — Encounter (HOSPITAL_COMMUNITY): Payer: 59 | Attending: Oncology

## 2011-08-21 DIAGNOSIS — C50119 Malignant neoplasm of central portion of unspecified female breast: Secondary | ICD-10-CM

## 2011-08-21 DIAGNOSIS — Z452 Encounter for adjustment and management of vascular access device: Secondary | ICD-10-CM

## 2011-08-21 MED ORDER — HEPARIN SOD (PORK) LOCK FLUSH 100 UNIT/ML IV SOLN
INTRAVENOUS | Status: AC
  Filled 2011-08-21: qty 5

## 2011-08-21 MED ORDER — SODIUM CHLORIDE 0.9 % IJ SOLN
INTRAMUSCULAR | Status: AC
  Filled 2011-08-21: qty 10

## 2011-08-21 MED ORDER — HEPARIN SOD (PORK) LOCK FLUSH 100 UNIT/ML IV SOLN
500.0000 [IU] | Freq: Once | INTRAVENOUS | Status: AC
  Administered 2011-08-21: 500 [IU] via INTRAVENOUS
  Filled 2011-08-21: qty 5

## 2011-08-21 MED ORDER — SODIUM CHLORIDE 0.9 % IJ SOLN
10.0000 mL | INTRAMUSCULAR | Status: DC | PRN
  Administered 2011-08-21: 10 mL via INTRAVENOUS
  Filled 2011-08-21: qty 10

## 2011-08-21 NOTE — Progress Notes (Signed)
Tolerated port flush well. 

## 2011-08-30 LAB — PULMONARY FUNCTION TEST

## 2011-09-11 ENCOUNTER — Other Ambulatory Visit: Payer: Self-pay | Admitting: Radiation Oncology

## 2011-09-13 ENCOUNTER — Ambulatory Visit (HOSPITAL_COMMUNITY)
Admission: RE | Admit: 2011-09-13 | Discharge: 2011-09-13 | Disposition: A | Discharge status: Home / Self Care | Payer: 59 | Source: Ambulatory Visit | Attending: Internal Medicine | Admitting: Internal Medicine

## 2011-09-13 ENCOUNTER — Other Ambulatory Visit (HOSPITAL_COMMUNITY): Payer: 59

## 2011-09-13 ENCOUNTER — Encounter (HOSPITAL_COMMUNITY): Payer: 59 | Attending: Oncology | Admitting: Oncology

## 2011-09-13 VITALS — BP 100/66 | HR 71 | Temp 97.8°F | Resp 18 | Wt 155.6 lb

## 2011-09-13 DIAGNOSIS — J449 Chronic obstructive pulmonary disease, unspecified: Secondary | ICD-10-CM | POA: Insufficient documentation

## 2011-09-13 DIAGNOSIS — I517 Cardiomegaly: Secondary | ICD-10-CM

## 2011-09-13 DIAGNOSIS — C50119 Malignant neoplasm of central portion of unspecified female breast: Secondary | ICD-10-CM

## 2011-09-13 DIAGNOSIS — I10 Essential (primary) hypertension: Secondary | ICD-10-CM | POA: Insufficient documentation

## 2011-09-13 DIAGNOSIS — Z9889 Other specified postprocedural states: Secondary | ICD-10-CM | POA: Insufficient documentation

## 2011-09-13 DIAGNOSIS — C773 Secondary and unspecified malignant neoplasm of axilla and upper limb lymph nodes: Secondary | ICD-10-CM

## 2011-09-13 DIAGNOSIS — Z9221 Personal history of antineoplastic chemotherapy: Secondary | ICD-10-CM | POA: Insufficient documentation

## 2011-09-13 DIAGNOSIS — C50919 Malignant neoplasm of unspecified site of unspecified female breast: Secondary | ICD-10-CM | POA: Insufficient documentation

## 2011-09-13 DIAGNOSIS — J4489 Other specified chronic obstructive pulmonary disease: Secondary | ICD-10-CM

## 2011-09-13 NOTE — Progress Notes (Signed)
*  PRELIMINARY RESULTS* Echocardiogram 2D Echocardiogram has been performed.  Sheila Garrison 09/13/2011, 8:53 AM

## 2011-09-13 NOTE — Progress Notes (Signed)
Sheila Heap, MD [address] [place] [address]  1. Stage III (T4N3) R Br cancer   Flaxseed, Linseed, (FLAX SEED OIL PO), clobetasol cream (TEMOVATE) 0.05 %, ketoconazole (NIZORAL) 2 % cream, CBC with Differential, Comprehensive metabolic panel    CURRENT THERAPY: On Letrozole beginning on 03/09/2011   INTERVAL HISTORY: Sheila Garrison 56 y.o. 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 She is S/P 6 cycles Docetaxel/carboplatin/trastuzumab every 21 days, S/P radiation, and now S/P 17 cycles of Herceptin, and presently on Letrozole 2.5 mg daily beginning on 03/09/2011. She started her Herceptin on 07/18/2010 according to [name], PA-C's (Dr. Darrall Dears PA) note on 07/07/2010.  Sheila Garrison is tolerating her letrozole well without any hot flashes.  She does have minimal arthralgias, but they are not debilitating.  She was recently seen by Dr. Basilio Cairo (Radiation Oncologist) and on breast examination, the patient was noted to have a right sided erythematous area at the site of the mastectomy.  A message from Dr. Basilio Cairo was sent to me and reviewed and also her surgeon, Dr. Jamey Ripa asking for him to see her sooner than her follow-up appointment in September.  She has seen her PCP, Dr. Modesto Charon regarding the rash and he has placed her on Clobetosol and Ketoconazole.  She reports that it is improving.  She has psoriasis and this is noted on her olecranon area.  This area of concern on her right post-mastectomy site also looks psoriatic in nature.    She underwent a 2-D echocardiogram this AM and the report is not finalized.    She is free to have her mammograms performed.  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  She does not smoke tobacco  any longer.  She is being referred to pulmonologist in the near future by her PCP.  Otherwise, she denies any complaints.  The importance of daily letrozole was discussed and she understands that she must take the medication daily.     Past Medical History  Diagnosis Date  . Hypertension   . Nipple discharge     with pain, infection, lump  . MRSA (methicillin resistant staph aureus) culture positive     in breast  . Wears dentures   . Breast CA 07/01/2010    rt breast ca  . Family history of breast cancer     Aunt on father's side  . Neuropathy     bilateral toes  . Hyperlipemia     has Stage III (T4N3) R Br cancer ; BP (high blood pressure); High cholesterol; Malignant neoplasm of upper-outer quadrant of female breast; and Shortness of breath on her problem list.      has no known allergies.  Sheila Garrison does not currently have medications on file.  Past Surgical History  Procedure Date  . Tubal ligation   . Dilation and curettage of uterus   . Breast surgery 2012    Denies any headaches, dizziness, double vision, fevers, chills, night sweats, nausea, vomiting, diarrhea, constipation, chest pain, heart palpitations, shortness of breath, blood in stool, black tarry stool, urinary pain, urinary burning, urinary frequency, hematuria.   PHYSICAL EXAMINATION  ECOG PERFORMANCE STATUS: 1 - Symptomatic but completely ambulatory  Filed Vitals:   09/13/11 6045  BP: 100/66  Pulse: 71  Temp: 97.8 F (36.6 C)  Resp: 18    GENERAL:alert, no distress, well nourished, well developed, comfortable, cooperative and smiling SKIN: skin color, texture, turgor are normal, no rashes or significant lesions HEAD: Normocephalic, No masses, lesions, tenderness or abnormalities EYES: normal, Conjunctiva are pink and non-injected EARS: External ears normal OROPHARYNX:lips, buccal mucosa, and tongue normal and mucous membranes are moist  NECK: supple, no adenopathy, thyroid normal size,  non-tender, without nodularity, no stridor, non-tender, trachea midline LYMPH:  no palpable lymphadenopathy BREAST:left breast normal without mass, skin or nipple changes or axillary nodes, right post-mastectomy site well healed with erythematous rash with discoid plaques that are scaly in appearance. LUNGS: clear to auscultation and percussion, decreased breath sounds HEART: regular rate & rhythm, no murmurs, no gallops, S1 normal and S2 normal ABDOMEN:abdomen soft, non-tender and normal bowel sounds BACK: Back symmetric, no curvature. EXTREMITIES:less then 2 second capillary refill, no joint deformities, effusion, or inflammation, no edema, no skin discoloration, no clubbing, no cyanosis, nails negative for pitting.  NEURO: alert & oriented x 3 with fluent speech, no focal motor/sensory deficits, gait normal    LABORATORY DATA: CBC    Component Value Date/Time   WBC 6.7 06/19/2011 1408   WBC 4.7 11/07/2010 0917   RBC 4.05 06/19/2011 1408   RBC 3.10* 11/07/2010 0917   HGB 12.6 06/19/2011 1408   HGB 10.1* 11/07/2010 0917   HCT 38.9 06/19/2011 1408   HCT 31.1* 11/07/2010 0917   PLT 273 06/19/2011 1408   PLT 194 11/07/2010 0917   MCV 96.0 06/19/2011 1408   MCV 100.3 11/07/2010 0917   MCH 31.1 06/19/2011 1408   MCH 32.6 11/07/2010 0917   MCHC 32.4 06/19/2011 1408   MCHC 32.5 11/07/2010 0917   RDW 15.3 06/19/2011 1408   RDW 18.3* 11/07/2010 0917   LYMPHSABS 1.9 06/19/2011 1408   LYMPHSABS 1.6 11/07/2010 0917   MONOABS 0.7 06/19/2011 1408   MONOABS 1.4* 11/07/2010 0917   EOSABS 0.3 06/19/2011 1408   EOSABS 0.0 11/07/2010 0917   BASOSABS 0.1 06/19/2011 1408   BASOSABS 0.0 11/07/2010 0917      Chemistry      Component Value Date/Time   NA 140 06/19/2011 1408   K 3.5 06/19/2011 1408   CL 102 06/19/2011 1408   CO2 27 06/19/2011 1408   BUN 12 06/19/2011 1408   CREATININE 0.70 06/19/2011 1408      Component Value Date/Time   CALCIUM 9.4 06/19/2011 1408   ALKPHOS 76 06/19/2011 1408   AST 21 06/19/2011 1408    ALT 14 06/19/2011 1408   BILITOT 0.1* 06/19/2011 1408        ASSESSMENT:  1. 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 She is S/P 6 cycles Docetaxel/carboplatin/trastuzumab every 21 days, S/P radiation, and now S/P 17 cycles of Herceptin, and presently on Letrozole 2.5 mg daily beginning on 03/09/2011. She started her Herceptin on 07/18/2010 according to [name], PA-C's (Dr. Darrall Dears PA) note on 07/07/2010. 2. Chronic obstructive pulmonary disease, having stopped 1 year ago and 1 month ago.  3. Tubal ligation.  4. History of dilatation and curettage.  5. Emphysema, secondary to smoking history.     PLAN:  1. 2-D Echo was performed this AM and the report is pending.  2. Continue with Letrozole daily.  3.  Lab work in 6 months: CBC dif, CMET.  If performed elsewhere will cancel our lab appointment.  4. Continue treatment of right breast skin with Clobetosol and Ketoconazole per PCP.  Follow-up as directed with Surgeon 5. Return in 6 months for follow-up   All questions were answered. The patient knows to call the clinic with any problems, questions or concerns. We can certainly see the patient much sooner if necessary.  Sheila Garrison

## 2011-09-17 ENCOUNTER — Telehealth (INDEPENDENT_AMBULATORY_CARE_PROVIDER_SITE_OTHER): Payer: Self-pay | Admitting: General Surgery

## 2011-09-17 NOTE — Telephone Encounter (Signed)
LMOM for patient to call back and ask for me. Needs sooner appt to evaluate rash on breast.

## 2011-09-18 NOTE — Telephone Encounter (Signed)
Spoke with patient. She will keep her appt on 10/10/2011. She declined earlier appt but will call if area gets worse. States she saw her oncologist who thought this was psoriasis. She is on a different medication for it, but will call if worse and keep appt on 10/10/2011.

## 2011-09-19 ENCOUNTER — Ambulatory Visit (HOSPITAL_COMMUNITY): Payer: 59 | Admitting: Oncology

## 2011-10-03 ENCOUNTER — Encounter (HOSPITAL_BASED_OUTPATIENT_CLINIC_OR_DEPARTMENT_OTHER): Payer: 59

## 2011-10-03 DIAGNOSIS — Z95828 Presence of other vascular implants and grafts: Secondary | ICD-10-CM

## 2011-10-03 DIAGNOSIS — Z452 Encounter for adjustment and management of vascular access device: Secondary | ICD-10-CM

## 2011-10-03 DIAGNOSIS — C50119 Malignant neoplasm of central portion of unspecified female breast: Secondary | ICD-10-CM

## 2011-10-03 MED ORDER — HEPARIN SOD (PORK) LOCK FLUSH 100 UNIT/ML IV SOLN
INTRAVENOUS | Status: AC
Start: 2011-10-03 — End: 2011-10-03
  Filled 2011-10-03: qty 5

## 2011-10-03 MED ORDER — SODIUM CHLORIDE 0.9 % IJ SOLN
INTRAMUSCULAR | Status: AC
  Filled 2011-10-03: qty 110

## 2011-10-03 MED ORDER — HEPARIN SOD (PORK) LOCK FLUSH 100 UNIT/ML IV SOLN
500.0000 [IU] | Freq: Once | INTRAVENOUS | Status: AC
  Administered 2011-10-03: 500 [IU] via INTRAVENOUS
  Filled 2011-10-03: qty 5

## 2011-10-03 MED ORDER — SODIUM CHLORIDE 0.9 % IJ SOLN
10.0000 mL | INTRAMUSCULAR | Status: DC | PRN
  Administered 2011-10-03: 10 mL via INTRAVENOUS
  Filled 2011-10-03: qty 10

## 2011-10-03 NOTE — Progress Notes (Signed)
Sheila Garrison presented for Portacath access and flush. Proper placement of portacath confirmed by CXR. Portacath located lt chest wall accessed with  H 20 needle. Good blood return present. Portacath flushed with 20ml NS and 500U/29ml Heparin and needle removed intact. Procedure without incident. Patient tolerated procedure well.

## 2011-10-09 ENCOUNTER — Ambulatory Visit (INDEPENDENT_AMBULATORY_CARE_PROVIDER_SITE_OTHER): Payer: 59 | Admitting: Internal Medicine

## 2011-10-09 ENCOUNTER — Encounter: Payer: Self-pay | Admitting: Internal Medicine

## 2011-10-09 VITALS — BP 126/82 | HR 73 | Temp 97.6°F | Ht 65.5 in | Wt 157.0 lb

## 2011-10-09 DIAGNOSIS — J449 Chronic obstructive pulmonary disease, unspecified: Secondary | ICD-10-CM

## 2011-10-09 DIAGNOSIS — J441 Chronic obstructive pulmonary disease with (acute) exacerbation: Secondary | ICD-10-CM | POA: Insufficient documentation

## 2011-10-09 NOTE — Assessment & Plan Note (Signed)
-   PFT's 03/21/11 FEV1   1.17 (44%) ratio 47 and 11% better p B2,  dlco 52%  GOLD III (almost  II p B2) copd s/p remote smoking cessation  I reviewed the Flethcher curve with patient that basically indicates  if you quit smoking when your best day FEV1 is still well preserved(as is her case) it is highly unlikely you will progress to severe disease and informed the patient there was no medication on the market that has proven to change the curve or the likelihood of progression.  Therefore stopping smoking and maintaining abstinence is the most important aspect of care, not choice of inhalers or for that matter, doctors.    For now reasonable to continue LAMA monotherapy.

## 2011-10-09 NOTE — Progress Notes (Signed)
  Subjective:    Patient ID: Sheila Garrison, female    DOB: 1955/07/30  MRN: [id number]  HPI  61 yowf quit smoking Jan 2012 p 3-4 years doe 49 with cough that resolved and breathing improving until started chemotherapy p R mastectomy completed rx Sept 2012 and referred to pulmonary clinic in [facility] 10/09/2011 for abn pft's c/w GOLD II/III copd in Feb 2013  10/09/2011 1st Pulmonary eval cc indolent onset minimally progressive doe since around 2010 better p quit smoking in 2012 and worse p chemo completed Sept 2012 mainly notice out walking in heat does ok [employer], one flight steps sometimes. No obvious daytime variabilty or assoc chronic cough or cp or chest tightness, subjective wheeze overt sinus or hb symptoms. No unusual exp hx or h/o childhood pna/ asthma or premature birth to her knowledge.   Sleeping ok without nocturnal  or early am exacerbation  of respiratory  c/o's or need for noct saba. Also denies any obvious fluctuation of symptoms with weather or environmental changes or other aggravating or alleviating factors except as outlined above. Better on tudorza but only takes it 4 x weekly once day.   Review of Systems  Constitutional: Negative for fever and unexpected weight change.  HENT: Negative for ear pain, nosebleeds, congestion, sore throat, rhinorrhea, sneezing, trouble swallowing, dental problem, postnasal drip and sinus pressure.   Eyes: Negative for redness and itching.  Respiratory: Positive for shortness of breath. Negative for cough, chest tightness and wheezing.   Cardiovascular: Negative for palpitations and leg swelling.  Gastrointestinal: Negative for nausea and vomiting.  Genitourinary: Negative for dysuria.  Musculoskeletal: Negative for joint swelling.  Skin: Negative for rash.  Neurological: Positive for headaches.  Hematological: Does not bruise/bleed easily.  Psychiatric/Behavioral: Negative for dysphoric mood. The patient is nervous/anxious.          Objective:   Physical Exam  Wt Readings from Last 3 Encounters:  10/09/11 157 lb (71.215 kg)  09/13/11 155 lb 9.6 oz (70.58 kg)  07/10/11 159 lb (72.122 kg)    HEENT mild turbinate edema.  Oropharynx no thrush or excess pnd or cobblestoning.  No JVD or cervical adenopathy. Mild accessory muscle hypertrophy. Trachea midline, nl thryroid. Chest was hyperinflated by percussion with diminished breath sounds and moderate increased exp time without wheeze. Hoover sign positive at mid inspiration. Regular rate and rhythm without murmur gallop or rub or increase P2 or edema.  Abd: no hsm, nl excursion. Ext warm without cyanosis or clubbing.    03/08/11 CXR x copd     Assessment & Plan:

## 2011-10-09 NOTE — Patient Instructions (Addendum)
Use turdorza once daily - this will improve your symptoms and exercise tolerance   To get the most out of exercise, you need to be continuously aware that you are short of breath, but never out of breath, for 30 minutes 3 x weekly.  As you improve, it will actually be easier for you to do the same amount of exercise  in  30 minutes so always push to the level where you are short of breath.    I recommend pneumovax now and then again if 5 years  Repeat the PFT's in one year and we'll see you then

## 2011-10-10 ENCOUNTER — Encounter (INDEPENDENT_AMBULATORY_CARE_PROVIDER_SITE_OTHER): Payer: Self-pay | Admitting: Surgery

## 2011-10-25 ENCOUNTER — Telehealth (HOSPITAL_COMMUNITY): Payer: Self-pay | Admitting: Cardiology

## 2011-10-25 NOTE — Telephone Encounter (Signed)
Attempted to contact pt for follow up (Aug 13) with echo lef tmessage with female for pt to return call

## 2011-10-30 NOTE — Telephone Encounter (Signed)
appt scheduled with pt 11/06/11

## 2011-11-06 ENCOUNTER — Ambulatory Visit (HOSPITAL_COMMUNITY)
Admission: RE | Admit: 2011-11-06 | Discharge: 2011-11-06 | Disposition: A | Discharge status: Home / Self Care | Payer: 59 | Source: Ambulatory Visit | Attending: Internal Medicine | Admitting: Internal Medicine

## 2011-11-06 ENCOUNTER — Encounter (HOSPITAL_COMMUNITY): Payer: Self-pay

## 2011-11-06 ENCOUNTER — Encounter (INDEPENDENT_AMBULATORY_CARE_PROVIDER_SITE_OTHER): Payer: Self-pay | Admitting: Surgery

## 2011-11-06 ENCOUNTER — Ambulatory Visit (INDEPENDENT_AMBULATORY_CARE_PROVIDER_SITE_OTHER): Payer: Commercial Managed Care - PPO | Admitting: Surgery

## 2011-11-06 VITALS — BP 128/72 | HR 71 | Ht 64.5 in | Wt 154.8 lb

## 2011-11-06 VITALS — BP 136/80 | HR 78 | Temp 98.0°F | Resp 18 | Ht 64.0 in | Wt 154.8 lb

## 2011-11-06 DIAGNOSIS — Z853 Personal history of malignant neoplasm of breast: Secondary | ICD-10-CM

## 2011-11-06 DIAGNOSIS — C50419 Malignant neoplasm of upper-outer quadrant of unspecified female breast: Secondary | ICD-10-CM | POA: Insufficient documentation

## 2011-11-06 DIAGNOSIS — L259 Unspecified contact dermatitis, unspecified cause: Secondary | ICD-10-CM

## 2011-11-06 DIAGNOSIS — C50119 Malignant neoplasm of central portion of unspecified female breast: Secondary | ICD-10-CM

## 2011-11-06 NOTE — Addendum Note (Signed)
Addended byLittie Deeds on: 11/06/2011 04:31 PM   Modules accepted: Orders

## 2011-11-06 NOTE — Patient Instructions (Addendum)
Follow up as needed

## 2011-11-06 NOTE — Patient Instructions (Signed)
Keep a dressing over the biopsy site until it gets a dry scab We will call you when we have a report on the biopsy

## 2011-11-06 NOTE — Progress Notes (Signed)
Chief complaint: Intermittent rash on right mastectomy site.   History of present illness: This patient presented in May 2012 with a large fungating bleeding right breast cancer. She underwent chemotherapy followed by right modified radical mastectomy followed by radiation therapy. The lesser week she has had a rash which has been worse over the right mastectomy site. Her physician initially thought this might be shingles and treated her with Valtrex no improvement. She's having using a steroid cream with this getting intermittently better and flaring back up. There is no pain there is no bleeding. She otherwise feels well. She's had no other medical issues.  Exam: Vital signs:BP 136/80  Pulse 78  Temp 98 F (36.7 C) (Temporal)  Resp 18  Ht 5\' 4"  (1.626 m)  Wt 154 lb 12.8 oz (70.217 kg)  BMI 26.57 kg/m2  SpO2 92% Gen.: Patient alert oriented healthy-appearing Breasts: The left breast is unremarkable. The right breast is surgically absent. There is some agitations consistent with prior radiation therapy. There is no evidence of subcutaneous mass or local recurrence. There are several small 1-3 mm red areas that feel a little bit rough. They do not appear to be nodular raised. They're not tender.    Impression: Intermittent rash right mastectomy site. Etiology is uncertain.  Plan: While I don't think this is a local recurrence, I think it would be appropriate to do a punch biopsy to rule out cancer. The patient is agreeable.  Procedure note: I chose one of the lower flap red areas to biopsy. The area was prepped with alcohol and anesthetized 1% Xylocaine with epinephrine. A 3 mm punch biopsy was taken. Sterile dressing applied. Patient tolerated procedure well.

## 2011-11-07 NOTE — Progress Notes (Signed)
HPI:  56 year old with carcinoma, right breast, stage III, status post neoadjuvant chemotherapy, S/P modified R mastectomy. Triple positive breast cancer. Herceptin was started and she will be completed in June 2013. She has received 6 cycles of carboplatin, docetaxel, and trastuzumab. She resumed chemo status post modified mastectomy 11/6 at [facility]. She will be followed by Dr Jerelyn Scott.   ECHO 06/2010 EF 60% lateral s prime velocity 11.2  ECHO 10/11/2010 EF 60% lateral s prime velocity 10.4  ECHO 12/19/10 EF 55-60% lateral s prime velocity 10.5 ECHO 03/08/11 EF 55% Lateral S' 10. Echo 05/29/11 EF 60-65% lateral s' - not done Echo 11/06/11 EF 60-65% lat s' 10.4  PFTs 03/2011: Spirometry shows a severe ventilatory defect with evidence of  airflow obstruction.  Lung volumes show air trapping.  DLCO is severely moderately reduced, but improved somewhat when corrected for ventilation.  Airway resistance is slightly elevated.  There is improvement with inhaled bronchodilator approaches, but does not reach the level of significance.  Completed Herceptin over the summer.  She feels good today.  Occasional shortness of breath ~1 week after herceptin therapy.  No edema.  No orthopnea/PND.     ROS: All systems negative except as listed in HPI, PMH and Problem List.  Past Medical History  Diagnosis Date  . Hypertension   . Nipple discharge     with pain, infection, lump  . MRSA (methicillin resistant staph aureus) culture positive     in breast  . Wears dentures   . Breast CA 07/01/2010    rt breast ca  . Family history of breast cancer     Aunt on father's side  . Neuropathy     bilateral toes  . Hyperlipemia     Current Outpatient Prescriptions  Medication Sig Dispense Refill  . Aclidinium Bromide (TUDORZA PRESSAIR) 400 MCG/ACT AEPB Inhale 1 puff into the lungs as needed.      . clobetasol cream (TEMOVATE) 0.05 % Apply 1 application topically 2 (two) times daily.      .  diphenhydrAMINE (BENADRYL) 25 MG tablet Take 25 mg by mouth every 6 (six) hours as needed.        . fexofenadine (ALLEGRA) 180 MG tablet Take 180 mg by mouth daily.        Marland Kitchen letrozole (FEMARA) 2.5 MG tablet Take 2.5 mg by mouth daily.      Marland Kitchen LORazepam (ATIVAN) 0.5 MG tablet Take 0.5 mg by mouth as needed.        Marland Kitchen losartan-hydrochlorothiazide (HYZAAR) 100-12.5 MG per tablet Take 1 tablet by mouth daily.        . Multiple Vitamin (MULTIVITAMIN) capsule Take 1 capsule by mouth daily.        . naproxen sodium (ANAPROX) 220 MG tablet Take 220 mg by mouth as needed.        Marland Kitchen omeprazole (PRILOSEC) 40 MG capsule Take 40 mg by mouth daily.      . potassium chloride SA (K-DUR,KLOR-CON) 20 MEQ tablet Take 1 tablet (20 mEq total) by mouth 2 (two) times daily.  180 tablet  3  . rosuvastatin (CRESTOR) 10 MG tablet Take 10 mg by mouth daily.        . Cholecalciferol (VITAMIN D-3 PO) Take by mouth daily.       Marland Kitchen ketoconazole (NIZORAL) 2 % cream Apply 1 application topically daily.      Marland Kitchen lidocaine-prilocaine (EMLA) cream Apply topically as needed.  PHYSICAL EXAM: Filed Vitals:   11/06/11 1059  BP: 128/72  Pulse: 71  Height: 5' 4.5" (1.638 m)  Weight: 154 lb 12.8 oz (70.217 kg)  SpO2: 93%   General:  Well appearing. No resp difficulty HEENT: normal Neck: supple. JVP flat. Carotids 2+ bilaterally; no bruits. No lymphadenopathy or thryomegaly appreciated. Cor: PMI normal. Regular rate & rhythm. No rubs, gallops or murmurs. Lungs:  decreased in bases.  Abdomen: soft, nontender, nondistended. No hepatosplenomegaly. No bruits or masses. Good bowel sounds. Extremities: no cyanosis, clubbing, rash, edema Neuro: alert & orientedx3, cranial nerves grossly intact. Moves all 4 extremities w/o difficulty. Affect pleasant.   ASSESSMENT & PLAN:

## 2011-11-07 NOTE — Assessment & Plan Note (Signed)
I reviewed echos personally. EF and Doppler parameters stable. No HF one exam. She can f/u on PRN basis.

## 2011-11-09 ENCOUNTER — Telehealth (INDEPENDENT_AMBULATORY_CARE_PROVIDER_SITE_OTHER): Payer: Self-pay | Admitting: General Surgery

## 2011-11-09 NOTE — Telephone Encounter (Signed)
Message copied by Liliana Cline on Fri Nov 09, 2011  8:45 AM ------      Message from: Currie Paris      Created: Fri Nov 09, 2011  7:28 AM       Tell her path is benign - some form of dermatitis - no cancer. If no improvement should see primary care or dermatologist

## 2011-11-09 NOTE — Telephone Encounter (Signed)
Left message on machine for patient to call back and ask for me. Calling to make patient aware of benign pathology. Awaiting patient's call.

## 2011-11-12 NOTE — Telephone Encounter (Signed)
Patient made aware of path results. She will follow up with dermatologist if needed. Copy of path faxed to patient at her primary MD's office.

## 2011-11-14 ENCOUNTER — Encounter (HOSPITAL_COMMUNITY): Payer: 59 | Attending: Oncology

## 2011-11-14 DIAGNOSIS — C50419 Malignant neoplasm of upper-outer quadrant of unspecified female breast: Secondary | ICD-10-CM

## 2011-11-14 DIAGNOSIS — Z95828 Presence of other vascular implants and grafts: Secondary | ICD-10-CM

## 2011-11-14 DIAGNOSIS — Z452 Encounter for adjustment and management of vascular access device: Secondary | ICD-10-CM

## 2011-11-14 DIAGNOSIS — Z9889 Other specified postprocedural states: Secondary | ICD-10-CM | POA: Insufficient documentation

## 2011-11-14 MED ORDER — HEPARIN SOD (PORK) LOCK FLUSH 100 UNIT/ML IV SOLN
500.0000 [IU] | Freq: Once | INTRAVENOUS | Status: AC
  Administered 2011-11-14: 500 [IU] via INTRAVENOUS
  Filled 2011-11-14: qty 5

## 2011-11-14 MED ORDER — SODIUM CHLORIDE 0.9 % IJ SOLN
10.0000 mL | INTRAMUSCULAR | Status: DC | PRN
  Administered 2011-11-14: 10 mL via INTRAVENOUS
  Filled 2011-11-14: qty 10

## 2011-11-14 MED ORDER — HEPARIN SOD (PORK) LOCK FLUSH 100 UNIT/ML IV SOLN
INTRAVENOUS | Status: AC
  Filled 2011-11-14: qty 5

## 2011-11-14 NOTE — Progress Notes (Signed)
Sheila Garrison presented for Portacath access and flush. Proper placement of portacath confirmed by CXR. Portacath located lt chest wall accessed with  H 20 needle. Good blood return present. Portacath flushed with 20ml NS and 500U/5ml Heparin and needle removed intact. Procedure without incident. Patient tolerated procedure well.   

## 2011-12-26 ENCOUNTER — Encounter (HOSPITAL_COMMUNITY): Payer: 59 | Attending: Oncology

## 2011-12-26 DIAGNOSIS — C50119 Malignant neoplasm of central portion of unspecified female breast: Secondary | ICD-10-CM

## 2011-12-26 DIAGNOSIS — Z9889 Other specified postprocedural states: Secondary | ICD-10-CM | POA: Insufficient documentation

## 2011-12-26 DIAGNOSIS — Z452 Encounter for adjustment and management of vascular access device: Secondary | ICD-10-CM

## 2011-12-26 MED ORDER — HEPARIN SOD (PORK) LOCK FLUSH 100 UNIT/ML IV SOLN
500.0000 [IU] | Freq: Once | INTRAVENOUS | Status: AC
  Administered 2011-12-26: 500 [IU] via INTRAVENOUS
  Filled 2011-12-26: qty 5

## 2011-12-26 MED ORDER — SODIUM CHLORIDE 0.9 % IJ SOLN
10.0000 mL | INTRAMUSCULAR | Status: DC | PRN
  Administered 2011-12-26: 10 mL via INTRAVENOUS
  Filled 2011-12-26: qty 10

## 2011-12-26 MED ORDER — HEPARIN SOD (PORK) LOCK FLUSH 100 UNIT/ML IV SOLN
INTRAVENOUS | Status: AC
  Filled 2011-12-26: qty 5

## 2011-12-26 NOTE — Progress Notes (Signed)
Sheila Garrison presented for Portacath access and flush.  Proper placement of portacath confirmed by CXR.  Portacath located left chest wall accessed with  H 20 needle.  No blood return and Portacath flushed with 20ml NS and 500U/87ml Heparin and needle removed intact.  Procedure without incident.  Patient tolerated procedure well.

## 2012-02-29 ENCOUNTER — Encounter (HOSPITAL_COMMUNITY): Payer: 59 | Attending: Oncology | Admitting: Oncology

## 2012-02-29 ENCOUNTER — Encounter (HOSPITAL_COMMUNITY): Payer: 59

## 2012-02-29 ENCOUNTER — Encounter (HOSPITAL_COMMUNITY): Payer: Self-pay | Admitting: Oncology

## 2012-02-29 VITALS — BP 104/69 | HR 65 | Temp 97.3°F | Resp 16 | Wt 156.8 lb

## 2012-02-29 DIAGNOSIS — C773 Secondary and unspecified malignant neoplasm of axilla and upper limb lymph nodes: Secondary | ICD-10-CM

## 2012-02-29 DIAGNOSIS — L589 Radiodermatitis, unspecified: Secondary | ICD-10-CM

## 2012-02-29 DIAGNOSIS — C50919 Malignant neoplasm of unspecified site of unspecified female breast: Secondary | ICD-10-CM

## 2012-02-29 DIAGNOSIS — C50119 Malignant neoplasm of central portion of unspecified female breast: Secondary | ICD-10-CM

## 2012-02-29 LAB — COMPREHENSIVE METABOLIC PANEL
Albumin: 3.9 g/dL (ref 3.5–5.2)
BUN: 11 mg/dL (ref 6–23)
Creatinine, Ser: 0.53 mg/dL (ref 0.50–1.10)
Total Protein: 7 g/dL (ref 6.0–8.3)

## 2012-02-29 LAB — CBC WITH DIFFERENTIAL/PLATELET
Basophils Absolute: 0 10*3/uL (ref 0.0–0.1)
Basophils Relative: 0 % (ref 0–1)
Eosinophils Relative: 2 % (ref 0–5)
HCT: 41.4 % (ref 36.0–46.0)
MCHC: 33.3 g/dL (ref 30.0–36.0)
MCV: 95.4 fL (ref 78.0–100.0)
Monocytes Absolute: 0.4 10*3/uL (ref 0.1–1.0)
RDW: 13.9 % (ref 11.5–15.5)

## 2012-02-29 MED ORDER — HEPARIN SOD (PORK) LOCK FLUSH 100 UNIT/ML IV SOLN
INTRAVENOUS | Status: AC
  Filled 2012-02-29: qty 5

## 2012-02-29 MED ORDER — HEPARIN SOD (PORK) LOCK FLUSH 100 UNIT/ML IV SOLN
500.0000 [IU] | Freq: Once | INTRAVENOUS | Status: AC
  Administered 2012-02-29: 500 [IU] via INTRAVENOUS
  Filled 2012-02-29: qty 5

## 2012-02-29 MED ORDER — SODIUM CHLORIDE 0.9 % IJ SOLN
10.0000 mL | INTRAMUSCULAR | Status: DC | PRN
  Administered 2012-02-29: 10 mL via INTRAVENOUS
  Filled 2012-02-29: qty 10

## 2012-02-29 NOTE — Patient Instructions (Addendum)
[facility] Discharge Instructions  RECOMMENDATIONS MADE BY THE CONSULTANT AND ANY TEST RESULTS WILL BE SENT TO YOUR REFERRING PHYSICIAN.  EXAM FINDINGS BY THE PHYSICIAN TODAY AND SIGNS OR SYMPTOMS TO REPORT TO CLINIC OR PRIMARY PHYSICIAN: Exam and discussion by MD.  Bonita Quin are doing well.  We will have you be seen in the Lymphedema clinic to be fitted for compression sleeve.  MEDICATIONS PRESCRIBED:  Continue the Femara  INSTRUCTIONS GIVEN AND DISCUSSED: Report any new lumps, bone pain or shortness of breath.  SPECIAL INSTRUCTIONS/FOLLOW-UP: Port flush every 6 weeks and to be seen in follow-up in 6 months.  Thank you for choosing [facility] to provide your oncology and hematology care.  To afford each patient quality time with our providers, please arrive at least 15 minutes before your scheduled appointment time.  With your help, our goal is to use those 15 minutes to complete the necessary work-up to ensure our physicians have the information they need to help with your evaluation and healthcare recommendations.    Effective January 1st, 2014, we ask that you re-schedule your appointment with our physicians should you arrive 10 or more minutes late for your appointment.  We strive to give you quality time with our providers, and arriving late affects you and other patients whose appointments are after yours.    Again, thank you for choosing [facility]  Our hope is that these requests will decrease the amount of time that you wait before being seen by our physicians.       _____________________________________________________________  Should you have questions after your visit to [facility], please contact our office at [phone] between the hours of 8:30 a.m. and 5:00 p.m.  Voicemails left after 4:30 p.m. will not be returned until the following business day.  For prescription refill requests, have your pharmacy contact our  office with your prescription refill request.

## 2012-02-29 NOTE — Progress Notes (Signed)
Problem #1 stage III C. (T4, N3) right-sided breast cancer. She had apparent axillary as well as intramammary lymph node involvement and suspicious ipsilateral subclavicular lymph node involvement. She took 6 cycles of carboplatin and docetaxel with trastuzumab every 21 days followed by surgery. She then had radiation therapy and finished one full year of trastuzumab. She then started on letrozole 2.5 mg on 03/09/2011 Problem #2 severe COPD no longer smoking Problem #3 skin rash in the radiation therapy field consistent with a dermatitis by biopsy by Dr. Jamey Ripa but still present in spite of her medications predominantly a steroid cream though she has an antifungal cream as well. I have asked her to use both creams twice a day for 14 days and this rash is not gone to consider seeing her dermatologist. This is a erythematous splotchy itchy rash in quite a few places in the radiation therapy field and may just be post radiation. Problem #4 hypertension  Oncology review of systems is otherwise quite good but she does have edema the right arm and has not seen a lymphedema specialist yet. We will try to set that up as soon as possible but it may be difficult with her work schedule since they are about to go live with [employer]. Vital signs are stable. She is still not smoking. She denies shortness of breath presently. She is not aware of any new lungs or bumps anywhere.BP 104/69  Pulse 65  Temp 97.3 F (36.3 C) (Oral)  Resp 16  Wt 156 lb 12.8 oz (71.124 kg)  She has no nodularity of the right chest wall. She has no nodules and her left breast. She has no palpable lymphadenopathy in the cervical, supraclavicular, infraclavicular, axillary or inguinal areas. Her liver is not palpably enlarged. There is no splenic enlargement. Bowel sounds are normal. Lungs show markedly diminished breath sounds throughout. They are almost nonexistent in the upper lobes. She has no rubs or rales. Heart shows a regular rhythm and rate  without murmur rub or gallop. Port-A-Cath is intact. She is no leg edema but her right arm is mildly swollen without question.  She also has 3 areas of irritation or skin besides the right chest wall mainly underneath the left breast and 2 areas on the back right where her bra straps meet the transverse portion of the bra. I think she needs to have this readjusted. She will make those arrangements. She will let us know about the rash if she needs help getting to see a dermatologist. Otherwise she will continue her letrozole. We'll check her blood work. We will see her in 6 months sooner if need be.

## 2012-03-01 LAB — CANCER ANTIGEN 27.29: CA 27.29: 26 U/mL (ref 0–39)

## 2012-03-13 ENCOUNTER — Encounter: Payer: Self-pay | Admitting: Oncology

## 2012-03-21 ENCOUNTER — Ambulatory Visit (HOSPITAL_COMMUNITY): Payer: 59 | Admitting: Oncology

## 2012-03-25 ENCOUNTER — Ambulatory Visit (HOSPITAL_COMMUNITY): Payer: Self-pay | Admitting: Physical Therapy

## 2012-03-26 ENCOUNTER — Other Ambulatory Visit (HOSPITAL_COMMUNITY): Payer: Self-pay | Admitting: Oncology

## 2012-03-26 DIAGNOSIS — C50119 Malignant neoplasm of central portion of unspecified female breast: Secondary | ICD-10-CM

## 2012-03-26 DIAGNOSIS — E876 Hypokalemia: Secondary | ICD-10-CM

## 2012-03-26 MED ORDER — LETROZOLE 2.5 MG PO TABS: 2.5000 mg | ORAL_TABLET | Freq: Every day | ORAL | Status: DC

## 2012-03-26 MED ORDER — POTASSIUM CHLORIDE CRYS ER 20 MEQ PO TBCR: 20.0000 meq | EXTENDED_RELEASE_TABLET | Freq: Two times a day (BID) | ORAL | Status: DC

## 2012-04-11 ENCOUNTER — Ambulatory Visit (HOSPITAL_COMMUNITY): Payer: 59 | Admitting: Physical Therapy

## 2012-04-11 ENCOUNTER — Encounter (HOSPITAL_COMMUNITY): Payer: Self-pay

## 2012-04-16 ENCOUNTER — Encounter (HOSPITAL_COMMUNITY): Payer: 59 | Attending: Oncology

## 2012-04-16 DIAGNOSIS — Z452 Encounter for adjustment and management of vascular access device: Secondary | ICD-10-CM

## 2012-04-16 DIAGNOSIS — C50119 Malignant neoplasm of central portion of unspecified female breast: Secondary | ICD-10-CM

## 2012-04-16 DIAGNOSIS — Z9889 Other specified postprocedural states: Secondary | ICD-10-CM | POA: Insufficient documentation

## 2012-04-16 DIAGNOSIS — Z95828 Presence of other vascular implants and grafts: Secondary | ICD-10-CM | POA: Insufficient documentation

## 2012-04-16 DIAGNOSIS — C773 Secondary and unspecified malignant neoplasm of axilla and upper limb lymph nodes: Secondary | ICD-10-CM

## 2012-04-16 MED ORDER — HEPARIN SOD (PORK) LOCK FLUSH 100 UNIT/ML IV SOLN
INTRAVENOUS | Status: AC
  Filled 2012-04-16: qty 5

## 2012-04-16 MED ORDER — SODIUM CHLORIDE 0.9 % IJ SOLN
10.0000 mL | INTRAMUSCULAR | Status: DC | PRN
  Administered 2012-04-16: 10 mL via INTRAVENOUS
  Filled 2012-04-16: qty 10

## 2012-04-16 MED ORDER — HEPARIN SOD (PORK) LOCK FLUSH 100 UNIT/ML IV SOLN
500.0000 [IU] | Freq: Once | INTRAVENOUS | Status: AC
  Administered 2012-04-16: 500 [IU] via INTRAVENOUS
  Filled 2012-04-16: qty 5

## 2012-04-16 NOTE — Progress Notes (Signed)
Sheila Garrison presented for Portacath access and flush. Proper placement of portacath confirmed by CXR. Portacath located left chest wall accessed with  H 20 needle. Good blood return present. Portacath flushed with 20ml NS and 500U/60ml Heparin and needle removed intact. Procedure without incident. Patient tolerated procedure well.

## 2012-04-28 ENCOUNTER — Ambulatory Visit (HOSPITAL_COMMUNITY): Payer: 59 | Admitting: Physical Therapy

## 2012-05-28 ENCOUNTER — Encounter (HOSPITAL_COMMUNITY): Payer: 59 | Attending: Oncology

## 2012-05-28 DIAGNOSIS — Z452 Encounter for adjustment and management of vascular access device: Secondary | ICD-10-CM

## 2012-05-28 DIAGNOSIS — C50119 Malignant neoplasm of central portion of unspecified female breast: Secondary | ICD-10-CM

## 2012-05-28 DIAGNOSIS — Z9889 Other specified postprocedural states: Secondary | ICD-10-CM | POA: Insufficient documentation

## 2012-05-28 DIAGNOSIS — C773 Secondary and unspecified malignant neoplasm of axilla and upper limb lymph nodes: Secondary | ICD-10-CM

## 2012-05-28 DIAGNOSIS — Z95828 Presence of other vascular implants and grafts: Secondary | ICD-10-CM

## 2012-05-28 MED ORDER — HEPARIN SOD (PORK) LOCK FLUSH 100 UNIT/ML IV SOLN
500.0000 [IU] | Freq: Once | INTRAVENOUS | Status: AC
  Administered 2012-05-28: 500 [IU] via INTRAVENOUS
  Filled 2012-05-28: qty 5

## 2012-05-28 MED ORDER — HEPARIN SOD (PORK) LOCK FLUSH 100 UNIT/ML IV SOLN
INTRAVENOUS | Status: AC
  Filled 2012-05-28: qty 5

## 2012-05-28 MED ORDER — SODIUM CHLORIDE 0.9 % IJ SOLN
10.0000 mL | INTRAMUSCULAR | Status: DC | PRN
  Administered 2012-05-28: 10 mL via INTRAVENOUS
  Filled 2012-05-28: qty 10

## 2012-05-28 NOTE — Progress Notes (Signed)
Sheila Garrison presented for Portacath access and flush. Proper placement of portacath confirmed by CXR. Portacath located left chest wall accessed with  H 20 needle. Good blood return present. Portacath flushed with 20ml NS and 500U/5ml Heparin and needle removed intact. Procedure without incident. Patient tolerated procedure well.   

## 2012-06-27 ENCOUNTER — Encounter: Payer: Self-pay | Admitting: Family Medicine

## 2012-07-09 ENCOUNTER — Encounter (HOSPITAL_COMMUNITY): Payer: 59

## 2012-07-16 ENCOUNTER — Encounter (HOSPITAL_COMMUNITY): Payer: 59 | Attending: Oncology

## 2012-07-16 DIAGNOSIS — C773 Secondary and unspecified malignant neoplasm of axilla and upper limb lymph nodes: Secondary | ICD-10-CM

## 2012-07-16 DIAGNOSIS — Z452 Encounter for adjustment and management of vascular access device: Secondary | ICD-10-CM

## 2012-07-16 DIAGNOSIS — Z95828 Presence of other vascular implants and grafts: Secondary | ICD-10-CM

## 2012-07-16 DIAGNOSIS — C50119 Malignant neoplasm of central portion of unspecified female breast: Secondary | ICD-10-CM

## 2012-07-16 DIAGNOSIS — Z9889 Other specified postprocedural states: Secondary | ICD-10-CM | POA: Insufficient documentation

## 2012-07-16 MED ORDER — HEPARIN SOD (PORK) LOCK FLUSH 100 UNIT/ML IV SOLN
500.0000 [IU] | Freq: Once | INTRAVENOUS | Status: AC
  Administered 2012-07-16: 500 [IU] via INTRAVENOUS
  Filled 2012-07-16: qty 5

## 2012-07-16 MED ORDER — SODIUM CHLORIDE 0.9 % IJ SOLN
10.0000 mL | INTRAMUSCULAR | Status: DC | PRN
  Administered 2012-07-16: 10 mL via INTRAVENOUS
  Filled 2012-07-16: qty 10

## 2012-07-16 MED ORDER — HEPARIN SOD (PORK) LOCK FLUSH 100 UNIT/ML IV SOLN
INTRAVENOUS | Status: AC
  Filled 2012-07-16: qty 5

## 2012-07-16 NOTE — Progress Notes (Signed)
Sheila Garrison presented for Portacath access and flush. Proper placement of portacath confirmed by CXR. Portacath located left chest wall accessed with  H 20 needle. Good blood return present. Portacath flushed with 20ml NS and 500U/58ml Heparin and needle removed intact. Procedure without incident. Patient tolerated procedure well.

## 2012-08-29 ENCOUNTER — Encounter (HOSPITAL_BASED_OUTPATIENT_CLINIC_OR_DEPARTMENT_OTHER): Payer: 59

## 2012-08-29 ENCOUNTER — Encounter (HOSPITAL_COMMUNITY): Payer: 59 | Attending: Oncology | Admitting: Oncology

## 2012-08-29 ENCOUNTER — Encounter (HOSPITAL_COMMUNITY): Payer: Self-pay | Admitting: Oncology

## 2012-08-29 VITALS — BP 113/71 | HR 73 | Temp 98.1°F | Resp 18 | Wt 168.0 lb

## 2012-08-29 DIAGNOSIS — C50111 Malignant neoplasm of central portion of right female breast: Secondary | ICD-10-CM

## 2012-08-29 DIAGNOSIS — C50119 Malignant neoplasm of central portion of unspecified female breast: Secondary | ICD-10-CM

## 2012-08-29 DIAGNOSIS — Z9889 Other specified postprocedural states: Secondary | ICD-10-CM | POA: Insufficient documentation

## 2012-08-29 DIAGNOSIS — Z95828 Presence of other vascular implants and grafts: Secondary | ICD-10-CM

## 2012-08-29 LAB — CBC WITH DIFFERENTIAL/PLATELET
Basophils Relative: 0 % (ref 0–1)
Eosinophils Absolute: 0.3 10*3/uL (ref 0.0–0.7)
MCV: 94.6 fL (ref 78.0–100.0)
Monocytes Absolute: 1 10*3/uL (ref 0.1–1.0)
Neutro Abs: 5.3 10*3/uL (ref 1.7–7.7)
RBC: 4.46 MIL/uL (ref 3.87–5.11)
RDW: 13.8 % (ref 11.5–15.5)
WBC: 8.8 10*3/uL (ref 4.0–10.5)

## 2012-08-29 LAB — COMPREHENSIVE METABOLIC PANEL
Albumin: 3.8 g/dL (ref 3.5–5.2)
BUN: 13 mg/dL (ref 6–23)
Calcium: 9.7 mg/dL (ref 8.4–10.5)
Creatinine, Ser: 0.58 mg/dL (ref 0.50–1.10)
Total Protein: 7.1 g/dL (ref 6.0–8.3)

## 2012-08-29 MED ORDER — HEPARIN SOD (PORK) LOCK FLUSH 100 UNIT/ML IV SOLN
500.0000 [IU] | Freq: Once | INTRAVENOUS | Status: AC
  Administered 2012-08-29: 500 [IU] via INTRAVENOUS
  Filled 2012-08-29: qty 5

## 2012-08-29 MED ORDER — HEPARIN SOD (PORK) LOCK FLUSH 100 UNIT/ML IV SOLN
INTRAVENOUS | Status: AC
  Filled 2012-08-29: qty 5

## 2012-08-29 MED ORDER — SODIUM CHLORIDE 0.9 % IJ SOLN
10.0000 mL | INTRAMUSCULAR | Status: DC | PRN
  Administered 2012-08-29: 10 mL via INTRAVENOUS
  Filled 2012-08-29: qty 10

## 2012-08-29 NOTE — Progress Notes (Signed)
[name], MD [address] [place] [address]  Cancer of central portion of female breast, right - Plan: CBC with Differential, Comprehensive metabolic panel, CBC with Differential, Comprehensive metabolic panel  CURRENT THERAPY: On Letrozole beginning on 03/09/2011  INTERVAL HISTORY: Sheila Garrison 57 y.o. female returns for  regular  visit for followup of stage III C. (T4, N3) right-sided breast cancer. She had apparent axillary as well as intramammary lymph node involvement and suspicious ipsilateral subclavicular lymph node involvement. She took 6 cycles of carboplatin and docetaxel with trastuzumab every 21 days followed by surgery. She then had radiation therapy and finished one full year of trastuzumab (finishing on 07/10/2011). She then started on letrozole 2.5 mg on 03/09/2011  Labs were performed today and are pending.   Sheila Garrison had both her Mammogram (Oct 2013) and Bone density (2013) performed at [facility].  I do not have to reports, but both were negative according to her.  She reports that she is now on Calcium and Vit D for her bone.  I have asked her to have those reports faxed to [place] and she is compliant with yearly screening mammograms.   I provided the patient education regarding the role Letrozole may play on her bone density and stressed the importance of having bone density testing every 2 years as indicated.   She is tolerating Letrozole well without any complaints.  She denies any increased arthralgias, myalgias, and hot flashes.   Recently, her office, Dr. Kathi Der office, went on [facility] which is causing some issues at the office.  Despite this, the office staff is doing well as a team.  I encouraged her to hang in there as it does improve as time goes on and people become more efficient on the system.   Oncologically, she denies any complaints and ROS questioning is negative.    Past Medical History  Diagnosis Date  . Hypertension   . Nipple  discharge     with pain, infection, lump  . MRSA (methicillin resistant staph aureus) culture positive     in breast  . Wears dentures   . Breast CA 07/01/2010    rt breast ca  . Family history of breast cancer     Aunt on father's side  . Neuropathy     bilateral toes  . Hyperlipemia     has Stage III (T4N3) R Br cancer ; BP (high blood pressure); High cholesterol; Malignant neoplasm of upper-outer quadrant of female breast; Shortness of breath; COPD (chronic obstructive pulmonary disease); and Port catheter in place on her problem list.     has No Known Allergies.  Sheila Garrison had no medications administered during this visit.  Past Surgical History  Procedure Laterality Date  . Tubal ligation    . Dilation and curettage of uterus    . Breast surgery  2012  . Port a catheter insertion      Denies any headaches, dizziness, double vision, fevers, chills, night sweats, nausea, vomiting, diarrhea, constipation, chest pain, heart palpitations, shortness of breath, blood in stool, black tarry stool, urinary pain, urinary burning, urinary frequency, hematuria.   PHYSICAL EXAMINATION  ECOG PERFORMANCE STATUS: 0 - Asymptomatic  Filed Vitals:   08/29/12 1400  BP: 113/71  Pulse: 73  Temp: 98.1 F (36.7 C)  Resp: 18    GENERAL:alert, no distress, well nourished, well developed, comfortable, cooperative and smiling SKIN: skin color, texture, turgor are normal, no rashes or significant lesions HEAD: Normocephalic, No  masses, lesions, tenderness or abnormalities EYES: normal, PERRLA, EOMI, Conjunctiva are pink and non-injected EARS: External ears normal OROPHARYNX:mucous membranes are moist  NECK: supple, no adenopathy, thyroid normal size, non-tender, without nodularity, no stridor, non-tender, trachea midline LYMPH:  no palpable lymphadenopathy, no hepatosplenomegaly BREAST:left breast normal without mass, skin or nipple changes or axillary nodes, right post-mastectomy site well  healed and free of suspicious changes with small papular, scale, figurate rash noted LUNGS: clear to auscultation and percussion HEART: regular rate & rhythm, no murmurs, no gallops, S1 normal and S2 normal ABDOMEN:abdomen soft, non-tender, obese, normal bowel sounds, no masses or organomegaly and no hepatosplenomegaly BACK: Back symmetric, no curvature., No CVA tenderness EXTREMITIES:less then 2 second capillary refill, no joint deformities, effusion, or inflammation, no edema, no skin discoloration, no clubbing, no cyanosis  NEURO: alert & oriented x 3 with fluent speech, no focal motor/sensory deficits, gait normal    LABORATORY DATA: CBC    Component Value Date/Time   WBC 5.8 02/29/2012 1053   WBC 4.7 11/07/2010 0917   RBC 4.34 02/29/2012 1053   RBC 3.10* 11/07/2010 0917   HGB 13.8 02/29/2012 1053   HGB 10.1* 11/07/2010 0917   HCT 41.4 02/29/2012 1053   HCT 31.1* 11/07/2010 0917   PLT 268 02/29/2012 1053   PLT 194 11/07/2010 0917   MCV 95.4 02/29/2012 1053   MCV 100.3 11/07/2010 0917   MCH 31.8 02/29/2012 1053   MCH 32.6 11/07/2010 0917   MCHC 33.3 02/29/2012 1053   MCHC 32.5 11/07/2010 0917   RDW 13.9 02/29/2012 1053   RDW 18.3* 11/07/2010 0917   LYMPHSABS 1.6 02/29/2012 1053   LYMPHSABS 1.6 11/07/2010 0917   MONOABS 0.4 02/29/2012 1053   MONOABS 1.4* 11/07/2010 0917   EOSABS 0.1 02/29/2012 1053   EOSABS 0.0 11/07/2010 0917   BASOSABS 0.0 02/29/2012 1053   BASOSABS 0.0 11/07/2010 0917      Chemistry      Component Value Date/Time   NA 139 02/29/2012 1053   K 3.9 02/29/2012 1053   CL 101 02/29/2012 1053   CO2 28 02/29/2012 1053   BUN 11 02/29/2012 1053   CREATININE 0.53 02/29/2012 1053      Component Value Date/Time   CALCIUM 9.6 02/29/2012 1053   ALKPHOS 82 02/29/2012 1053   AST 25 02/29/2012 1053   ALT 17 02/29/2012 1053   BILITOT 0.2* 02/29/2012 1053     Lab Results  Component Value Date   LABCA2 26 02/29/2012     PENDING LABS: CBC diff, CMET    ASSESSMENT:  1. Stage III C.  (T4, N3) right-sided breast cancer. She had apparent axillary as well as intramammary lymph node involvement and suspicious ipsilateral subclavicular lymph node involvement. She took 6 cycles of carboplatin and docetaxel with trastuzumab every 21 days followed by surgery. She then had radiation therapy and finished one full year of trastuzumab (finishing on 07/10/2011). She then started on letrozole 2.5 mg on 03/09/2011 2. Severe COPD no longer smoking  3. Skin rash in the radiation therapy field consistent with a dermatitis by biopsy by Dr. Jamey Ripa but still present in spite of her medications predominantly a steroid cream though she has an antifungal cream as well. The rash continues but she is not interested in seeing a dermatologist at this time.  4. Hypertension  Patient Active Problem List   Diagnosis Date Noted  . Port catheter in place 04/16/2012  . COPD (chronic obstructive pulmonary disease) 10/09/2011  . Shortness of breath 03/11/2011  .  Malignant neoplasm of upper-outer quadrant of female breast 12/22/2010  . BP (high blood pressure) 09/06/2010  . High cholesterol 09/06/2010  . Stage III (T4N3) R Br cancer  06/21/2010      PLAN:  1. I personally reviewed and went over laboratory results with the patient. 2. I personally reviewed and went over radiographic studies with the patient. 3. Chart reviewed 4. Next screening mammogram is due in October 2014. 5. Patient completed both her mammogram and bone density testing at [facility].  She will get those two reports faxed to [place].   6. Labs today: CBC diff, CMET 7. Labs in 6 months: CBC diff, CMET 8. Patient education regarding letrozole's potential effect on bone density 9. Continue daily Letrozole 10. Return in 6 months for follow-up.   THERAPY PLAN:  Sheila Garrison is doing well.  She completed both her mammogram and bone density testing at [facility].  She will have both of those reports faxed to [place]. She will be due for  her next screening mammogram in October 2014.  She is to continue with her Letrozole.   All questions were answered. The patient knows to call the clinic with any problems, questions or concerns. We can certainly see the patient much sooner if necessary.  Patient and plan discussed with Dr. Erline Hau and he is in agreement with the aforementioned.   Lavarius Doughten

## 2012-08-29 NOTE — Progress Notes (Signed)
Please forward the requested information from our office on this patient to this Dr.

## 2012-08-29 NOTE — Progress Notes (Signed)
Sheila Garrison presented for Portacath access and flush. Proper placement of portacath confirmed by CXR. Portacath located left chest wall accessed with  H 20 needle. Good blood return present. Portacath flushed with 20ml NS and 500U/5ml Heparin and needle removed intact. Procedure without incident. Patient tolerated procedure well.   

## 2012-08-29 NOTE — Patient Instructions (Addendum)
.  [facility] Discharge Instructions  RECOMMENDATIONS MADE BY THE CONSULTANT AND ANY TEST RESULTS WILL BE SENT TO YOUR REFERRING PHYSICIAN.  EXAM FINDINGS BY THE PHYSICIAN TODAY AND SIGNS OR SYMPTOMS TO REPORT TO CLINIC OR PRIMARY PHYSICIAN: Exam good   INSTRUCTIONS GIVEN AND DISCUSSED: Report any new lumps or bumps  SPECIAL INSTRUCTIONS/FOLLOW-UP: 6 months  Thank you for choosing [facility] to provide your oncology and hematology care.  To afford each patient quality time with our providers, please arrive at least 15 minutes before your scheduled appointment time.  With your help, our goal is to use those 15 minutes to complete the necessary work-up to ensure our physicians have the information they need to help with your evaluation and healthcare recommendations.    Effective January 1st, 2014, we ask that you re-schedule your appointment with our physicians should you arrive 10 or more minutes late for your appointment.  We strive to give you quality time with our providers, and arriving late affects you and other patients whose appointments are after yours.    Again, thank you for choosing [facility].  Our hope is that these requests will decrease the amount of time that you wait before being seen by our physicians.       _____________________________________________________________  Should you have questions after your visit to [facility], please contact our office at [phone] between the hours of 8:30 a.m. and 5:00 p.m.  Voicemails left after 4:30 p.m. will not be returned until the following business day.  For prescription refill requests, have your pharmacy contact our office with your prescription refill request.

## 2012-09-10 ENCOUNTER — Encounter: Payer: Self-pay | Admitting: Radiation Oncology

## 2012-09-12 ENCOUNTER — Encounter: Payer: Self-pay | Admitting: Radiation Oncology

## 2012-09-12 ENCOUNTER — Ambulatory Visit
Admission: RE | Admit: 2012-09-12 | Discharge: 2012-09-12 | Disposition: A | Discharge status: Home / Self Care | Payer: 59 | Source: Ambulatory Visit | Attending: Radiation Oncology | Admitting: Radiation Oncology

## 2012-09-12 VITALS — BP 126/56 | HR 68 | Temp 98.0°F | Ht 64.0 in | Wt 170.4 lb

## 2012-09-12 DIAGNOSIS — C50411 Malignant neoplasm of upper-outer quadrant of right female breast: Secondary | ICD-10-CM

## 2012-09-12 HISTORY — DX: Personal history of antineoplastic chemotherapy: Z92.21

## 2012-09-12 HISTORY — DX: Long term (current) use of aromatase inhibitors: Z79.811

## 2012-09-12 HISTORY — DX: Personal history of irradiation: Z92.3

## 2012-09-12 NOTE — Progress Notes (Signed)
Sheila Garrison here today for fu s/p radiation to her right breast.  No voiced complaints.   Mammogram report from 10 2013 obtained from [facility].

## 2012-09-12 NOTE — Progress Notes (Signed)
Radiation Oncology         [phone] ________________________________  Name: Sheila Garrison MRN: [id number]  Date: 09/12/2012  DOB: 1955/05/21  Follow-Up Visit Note  Outpatient  CC: [name], MD  Ernestina Penna, MD  Diagnosis and Prior Radiotherapy:   Clinical [phone], ypT2N2a triple positive Right UOQ breast cancer  Received post-mastectomy RT to the right Chest wall, SCV, PAB regions through 02/22/11 in [place], [place] under my care.  Total dose - 50Gy/25 to chest wall and nodes, with scar boost over CW of 10Gy/5.  Narrative:  The patient returns today for routine follow-up.  .She    Continues to have a Right chest wall rash, but it is non bothersome and is improved.  She applies Clobetasol Cream.        It was biopsied by DR. Streck and was a dermatitis, benign.     She declines referral to dermatology.  Recently saw Jenita Seashore at [facility] and is without evidence of disease.  No complaints.            ALLERGIES:  has No Known Allergies.  Meds: Current Outpatient Prescriptions  Medication Sig Dispense Refill  . Aclidinium Bromide (TUDORZA PRESSAIR) 400 MCG/ACT AEPB Inhale 1 puff into the lungs as needed.      . Cholecalciferol (VITAMIN D-3 PO) Take 1 capsule by mouth daily.       . clobetasol cream (TEMOVATE) 0.05 % Apply 1 application topically as needed.       . fexofenadine (ALLEGRA) 180 MG tablet Take 180 mg by mouth daily.        Marland Kitchen letrozole (FEMARA) 2.5 MG tablet Take 1 tablet (2.5 mg total) by mouth daily.  90 tablet  3  . lidocaine-prilocaine (EMLA) cream Apply topically as needed.        Marland Kitchen LORazepam (ATIVAN) 0.5 MG tablet Take 0.5 mg by mouth as needed.        Marland Kitchen losartan-hydrochlorothiazide (HYZAAR) 100-12.5 MG per tablet Take 1 tablet by mouth daily.        . Multiple Vitamin (MULTIVITAMIN) capsule Take 1 capsule by mouth daily.        . naproxen sodium (ANAPROX) 220 MG tablet Take 220 mg by mouth as needed.        Marland Kitchen omeprazole (PRILOSEC) 40 MG capsule Take 40 mg by  mouth daily.      . potassium chloride SA (K-DUR,KLOR-CON) 20 MEQ tablet Take 1 tablet (20 mEq total) by mouth 2 (two) times daily.  180 tablet  3  . rosuvastatin (CRESTOR) 10 MG tablet Take 10 mg by mouth daily.        Marland Kitchen ketoconazole (NIZORAL) 2 % cream Apply 1 application topically as needed.        No current facility-administered medications for this encounter.    Physical Findings: The patient is in no acute distress. Patient is alert and oriented.  height is 5\' 4"  (1.626 m) and weight is 170 lb 6.4 oz (77.293 kg). Her temperature is 98 F (36.7 C). Her blood pressure is 126/56 and her pulse is 68. .  Scattered erythematous macular rash over right chest wall.  No nodularity.  No palpable nodes in neck or axilla bilaterally.  Left breast - no palpable lesions. Modest lymphedema, right arm  Lab Findings: Lab Results  Component Value Date   WBC 8.8 08/29/2012   HGB 14.0 08/29/2012   HCT 42.2 08/29/2012   MCV 94.6 08/29/2012   PLT 282 08/29/2012  Radiographic Findings: BIRADS 1  Left mammo on Dec 05 2011.  Impression/Plan:  Doing well.   Declines derm appt. I will refer to PT at [facility] for right arm lymphedema.   Will see her back in 1 year for physical exam, sooner if needed.   I spent 20 minutes face to face with the patient and more than 50% of that time was spent in counseling and/or coordination of care. _____________________________________   Lonie Peak, MD

## 2012-09-19 ENCOUNTER — Other Ambulatory Visit: Payer: Self-pay | Admitting: Family Medicine

## 2012-09-19 MED ORDER — LORAZEPAM 0.5 MG PO TABS: 0.5000 mg | ORAL_TABLET | Freq: Three times a day (TID) | ORAL | Status: DC | PRN

## 2012-09-25 ENCOUNTER — Ambulatory Visit (INDEPENDENT_AMBULATORY_CARE_PROVIDER_SITE_OTHER): Payer: 59 | Admitting: *Deleted

## 2012-09-25 DIAGNOSIS — Z2911 Encounter for prophylactic immunotherapy for respiratory syncytial virus (RSV): Secondary | ICD-10-CM

## 2012-09-25 DIAGNOSIS — Z23 Encounter for immunization: Secondary | ICD-10-CM

## 2012-09-25 NOTE — Patient Instructions (Signed)
Herpes Zoster Virus Vaccine What is this medicine? HERPES ZOSTER VIRUS VACCINE (HUR peez ZOS ter vahy ruhs vak SEEN) is a vaccine. It is used to prevent shingles in adults 59 years old and over. This vaccine is not used to treat shingles or nerve pain from shingles. This medicine may be used for other purposes; ask your health care provider or pharmacist if you have questions. What should I tell my health care provider before I take this medicine? They need to know if you have any of these conditions: -cancer like leukemia or lymphoma -immune system problems or therapy -infection with fever -tuberculosis -an unusual or allergic reaction to vaccines, neomycin, gelatin, other medicines, foods, dyes, or preservatives -pregnant or trying to get pregnant -breast-feeding How should I use this medicine? This vaccine is for injection under the skin. It is given by a health care professional. Talk to your pediatrician regarding the use of this medicine in children. This medicine is not approved for use in children. Overdosage: If you think you have taken too much of this medicine contact a poison control center or emergency room at once. NOTE: This medicine is only for you. Do not share this medicine with others. What if I miss a dose? This does not apply. What may interact with this medicine? Do not take this medicine with any of the following medications: -adalimumab -anakinra -etanercept -infliximab -medicines to treat cancer -medicines that suppress your immune system This medicine may also interact with the following medications: -immunoglobulins -steroid medicines like prednisone or cortisone This list may not describe all possible interactions. Give your health care provider a list of all the medicines, herbs, non-prescription drugs, or dietary supplements you use. Also tell them if you smoke, drink alcohol, or use illegal drugs. Some items may interact with your medicine. What should I  watch for while using this medicine? Visit your doctor for regular check ups. This vaccine, like all vaccines, may not fully protect everyone. After receiving this vaccine it may be possible to pass chickenpox infection to others. Avoid people with immune system problems, pregnant women who have not had chickenpox, and newborns of women who have not had chickenpox. Talk to your doctor for more information. What side effects may I notice from receiving this medicine? Side effects that you should report to your doctor or health care professional as soon as possible: -allergic reactions like skin rash, itching or hives, swelling of the face, lips, or tongue -breathing problems -feeling faint or lightheaded, falls -fever, flu-like symptoms -pain, tingling, numbness in the hands or feet -swelling of the ankles, feet, hands -unusually weak or tired Side effects that usually do not require medical attention (report to your doctor or health care professional if they continue or are bothersome): -aches or pains -chickenpox-like rash -diarrhea -headache -loss of appetite -nausea, vomiting -redness, pain, swelling at site where injected -runny nose This list may not describe all possible side effects. Call your doctor for medical advice about side effects. You may report side effects to FDA at 1-800-FDA-1088. Where should I keep my medicine? This drug is given in a hospital or clinic and will not be stored at home. NOTE: This sheet is a summary. It may not cover all possible information. If you have questions about this medicine, talk to your doctor, pharmacist, or health care provider.  2013, Elsevier/Gold Standard. (07/11/2009 5:43:50 PM)  

## 2012-09-25 NOTE — Progress Notes (Signed)
Patient tolerated well.

## 2012-09-26 ENCOUNTER — Encounter: Payer: Self-pay | Admitting: Oncology

## 2012-10-10 ENCOUNTER — Encounter (HOSPITAL_COMMUNITY): Payer: 59

## 2012-10-14 ENCOUNTER — Encounter (HOSPITAL_COMMUNITY): Payer: 59 | Attending: Oncology

## 2012-10-14 ENCOUNTER — Ambulatory Visit (HOSPITAL_COMMUNITY)
Admission: RE | Admit: 2012-10-14 | Discharge: 2012-10-14 | Disposition: A | Discharge status: Home / Self Care | Payer: 59 | Source: Ambulatory Visit | Attending: Radiation Oncology | Admitting: Radiation Oncology

## 2012-10-14 DIAGNOSIS — Z452 Encounter for adjustment and management of vascular access device: Secondary | ICD-10-CM

## 2012-10-14 DIAGNOSIS — I972 Postmastectomy lymphedema syndrome: Secondary | ICD-10-CM | POA: Insufficient documentation

## 2012-10-14 DIAGNOSIS — J4489 Other specified chronic obstructive pulmonary disease: Secondary | ICD-10-CM | POA: Insufficient documentation

## 2012-10-14 DIAGNOSIS — Z95828 Presence of other vascular implants and grafts: Secondary | ICD-10-CM

## 2012-10-14 DIAGNOSIS — IMO0001 Reserved for inherently not codable concepts without codable children: Secondary | ICD-10-CM | POA: Insufficient documentation

## 2012-10-14 DIAGNOSIS — I10 Essential (primary) hypertension: Secondary | ICD-10-CM | POA: Insufficient documentation

## 2012-10-14 DIAGNOSIS — I890 Lymphedema, not elsewhere classified: Secondary | ICD-10-CM | POA: Insufficient documentation

## 2012-10-14 DIAGNOSIS — Z9889 Other specified postprocedural states: Secondary | ICD-10-CM | POA: Insufficient documentation

## 2012-10-14 DIAGNOSIS — C50919 Malignant neoplasm of unspecified site of unspecified female breast: Secondary | ICD-10-CM | POA: Insufficient documentation

## 2012-10-14 DIAGNOSIS — C50119 Malignant neoplasm of central portion of unspecified female breast: Secondary | ICD-10-CM

## 2012-10-14 DIAGNOSIS — J449 Chronic obstructive pulmonary disease, unspecified: Secondary | ICD-10-CM | POA: Insufficient documentation

## 2012-10-14 MED ORDER — HEPARIN SOD (PORK) LOCK FLUSH 100 UNIT/ML IV SOLN
500.0000 [IU] | Freq: Once | INTRAVENOUS | Status: AC
  Administered 2012-10-14: 500 [IU] via INTRAVENOUS
  Filled 2012-10-14: qty 5

## 2012-10-14 MED ORDER — HEPARIN SOD (PORK) LOCK FLUSH 100 UNIT/ML IV SOLN
INTRAVENOUS | Status: AC
  Filled 2012-10-14: qty 5

## 2012-10-14 MED ORDER — SODIUM CHLORIDE 0.9 % IJ SOLN
10.0000 mL | INTRAMUSCULAR | Status: DC | PRN
  Administered 2012-10-14: 10 mL via INTRAVENOUS
  Filled 2012-10-14: qty 10

## 2012-10-14 NOTE — Evaluation (Signed)
Physical Therapy Evaluation  Patient Details  Name: Sheila Garrison MRN: [id number] Date of Birth: Apr 04, 1955  Today's Date: 10/14/2012 Time: 1525-1630 PT Time Calculation (min): 65 min Charge:  eval 1525-1600; manual bandaging 1600-1630             Visit#: 1 of 12  Re-eval: 11/13/12 Assessment Diagnosis: lymphedema Surgical Date: 01/24/13  Authorization: UMR     Past Medical History:  Past Medical History  Diagnosis Date  . Hypertension   . Nipple discharge     with pain, infection, lump  . MRSA (methicillin resistant staph aureus) culture positive     in breast  . Wears dentures   . Breast CA 07/01/2010    rt breast ca  . Family history of breast cancer     Aunt on father's side  . Neuropathy     bilateral toes  . Hyperlipemia   . Status post chemotherapy     6 cycles of carboplatin and docetaxel with trastuzumab every 21 days followed by surgery  . Use of letrozole (Femara) 03/09/2011  . S/P radiation therapy 01/08/11 - 02/22/11    Right Chest Wall/ 50 Gy / 25 Fractions, Right [place]/ 50 gy/25 Fractions, Right Axillary Boost/500 cGy/25 Fractions, Right Chest Wall Boost/10 Gy/5 Fractions   Past Surgical History:  Past Surgical History  Procedure Laterality Date  . Tubal ligation    . Dilation and curettage of uterus    . Breast surgery  2012  . Port a catheter insertion      Subjective Symptoms/Limitations Symptoms: [name] states that she was diagnosed with Rt breast cancer approximately two years ago.  The pateint had 11 lymph nodes removed and went through 35 bouts of radication.  She states that ever since then she has noticed increased swelling in her Rt arm.  She is being referred for  Total decongestive techniques to reduce her swelling and education to keep her arm reduced. Pertinent History: as above Pain Assessment Currently in Pain?: No/denies    Sensation/Coordination/Flexibility/Functional Tests Functional Tests Functional Tests: Rt volume  2160.67; L 1882.63  Assessment RUE AROM (degrees) Overall AROM Right Upper Extremity: Within functional limits for tasks performed    Manual Therapy Manual Therapy: Other (comment) Other Manual Therapy: Pt recieved compression dressing using multilayer short stretch bandages as well as foam.  PT needs to type and was concerned about finger wrapping; there appeared minimal swelling in fingers so therapist attempted wrapping without fingers.  Physical Therapy Assessment and Plan PT Assessment and Plan Clinical Impression Statement: Pt with Rt lymphedema following lymph node removal and radiation. Pt will benefit from skilled therapy for total decongestive manual techniques including multilayer bandaging as well as education on lymphedema and controling it's sx. Pt will benefit from skilled therapeutic intervention in order to improve on the following deficits: Other (comment) (reduce lymph swelling) Rehab Potential: Good PT Frequency: Min 3X/week PT Duration: 4 weeks PT Treatment/Interventions: Manual techniques;Patient/family education PT Plan: education, give HEP, decongestive manual techniques and bandaging as pt is only able to come three times a week due to work activities.l    Goals PT Short Term Goals PT Short Term Goal 1: Pt to be I in skin care adn car of bandages PT Short Term Goal 2: pt to verbalize precautions for decrasing risk of infection PT Short Term Goal 3: Pt to be in HEP to promote lymphatic circualtion PT Short Term Goal 4: reduce volume by 40% PT Long Term Goals Time to Complete Long Term  Goals: 4 weeks PT Long Term Goal 1: reduce volume by 80% PT Long Term Goal 2: Pt to undrstand the maintenance phase of treatment Long Term Goal 3: Pt to understand where and how to be fitted for a compression garment. Long Term Goal 4: Pt to be I wth self bandaging or I with other night time compression  Problem List Patient Active Problem List   Diagnosis Date Noted  .  Lymphedema of arm 10/14/2012  . Port catheter in place 04/16/2012  . COPD (chronic obstructive pulmonary disease) 10/09/2011  . Shortness of breath 03/11/2011  . Malignant neoplasm of upper-outer quadrant of female breast 12/22/2010  . BP (high blood pressure) 09/06/2010  . High cholesterol 09/06/2010  . Stage III (T4N3) R Br cancer  06/21/2010    PT Plan of Care PT Home Exercise Plan: to be given next treatment  GP    Derian Dimalanta,CINDY 10/14/2012, 5:34 PM  Physician Documentation Your signature is required to indicate approval of the treatment plan as stated above.  Please sign and either send electronically or make a copy of this report for your files and return this physician signed original.   Please mark one 1.__approve of plan  2. ___approve of plan with the following conditions.   ______________________________                                                          _____________________ Physician Signature                                                                                                             Date

## 2012-10-14 NOTE — Progress Notes (Signed)
Sheila Garrison presented for Portacath access and flush. Proper placement of portacath confirmed by CXR. Portacath located left chest wall accessed with  H 20 needle. Good blood return present. Portacath flushed with 20ml NS and 500U/57ml Heparin and needle removed intact. Procedure without incident. Patient tolerated procedure well.

## 2012-10-16 ENCOUNTER — Ambulatory Visit (HOSPITAL_COMMUNITY)
Admission: RE | Admit: 2012-10-16 | Discharge: 2012-10-16 | Disposition: A | Discharge status: Home / Self Care | Payer: 59 | Source: Ambulatory Visit | Attending: Family Medicine | Admitting: Family Medicine

## 2012-10-16 DIAGNOSIS — I890 Lymphedema, not elsewhere classified: Secondary | ICD-10-CM

## 2012-10-16 NOTE — Progress Notes (Signed)
Physical Therapy Treatment Patient Details  Name: Sheila Garrison MRN: [id number] Date of Birth: [birth year]  Today's Date: 10/16/2012 Time: 1600-1705 PT Time Calculation (min): 65 min Charge manual 1600-1705 Visit#: 2 of 12       Authorization: UMR   Subjective: Symptoms/Limitations Symptoms: Pt states the compression bandages became too uncomfortable at the hand and she had to take them off.    Exercise/Treatments       Manual Therapy Manual Therapy: Other (comment) Other Manual Therapy: Pt recieved complete decongestive manual techniques to include shoulder; deep and superficial abdominal, routing fluid to Lt axillary and Rt inguinal using both anterior and posterior anastomosis.  Folllowed by multilayer compression wrapping using short stretch bandaging.  Physical Therapy Assessment and Plan PT Assessment and Plan Clinical Impression Statement: Pt with Rt lymphedema needing complete decongestive therapy;  bandaged fingers to see if this improved pt comfort. PT Plan: assess if pt bandages did better with wrapping fingers.  assess if pt noted any increase of urination    Goals    Problem List Patient Active Problem List   Diagnosis Date Noted  . Lymphedema of arm 10/14/2012  . Port catheter in place 04/16/2012  . COPD (chronic obstructive pulmonary disease) 10/09/2011  . Shortness of breath 03/11/2011  . Malignant neoplasm of upper-outer quadrant of female breast 12/22/2010  . BP (high blood pressure) 09/06/2010  . High cholesterol 09/06/2010  . Stage III (T4N3) R Br cancer  06/21/2010    PT Plan of Care PT Home Exercise Plan: given  GP    RUSSELL,CINDY 10/16/2012, 5:35 PM

## 2012-10-20 ENCOUNTER — Ambulatory Visit (HOSPITAL_COMMUNITY)
Admission: RE | Admit: 2012-10-20 | Discharge: 2012-10-20 | Disposition: A | Discharge status: Home / Self Care | Payer: 59 | Source: Ambulatory Visit | Attending: Physical Therapy | Admitting: Physical Therapy

## 2012-10-20 NOTE — Progress Notes (Signed)
Physical Therapy Treatment Patient Details  Name: Sheila Garrison MRN: [id number] Date of Birth: 1955-06-15  Today's Date: 10/20/2012 Time: 1640-1730 PT Time Calculation (min): 50 min Visit#: 3 of 12  Authorization: UMR  Charges:  Manual 48'  Subjective: Symptoms/Limitations Symptoms: Pt states she took the bandages off on Friday.  STates the finger wraps came off thursday night.  No difference reported with the finger wraps. Pain Assessment Currently in Pain?: No/denies   Objective: Manual Therapy Manual Therapy: Other (comment) Other Manual Therapy: Pt received complete decongestive manual techniques for Rt UE to include shoulder; deep and superficial abdominal, routing fluid to Lt axillary and Rt inguinal using both anterior and posterior anastomosis. Folllowed by multilayer compression wrapping using short stretch bandaging with 1X6", 1X8" and 3X10" bandages.  Physical Therapy Assessment and Plan PT Assessment and Plan PT Assessment:  Educated on importance of keeping compression on /performing exercises with compression on.  Pt. Able to verbalize understanding.  Pt prefers not to use finger wraps as she was unable to use her computer keyboard as well.  Urged pt to bring spouse to educate him on massage techniques.  Pt given signed script for Rt UE compression garment and instructed to make an appointement for next week. PT Plan: continue CDT.  Make sure pt made appt with garment fitter next visit.  Re-measure this week.   Problem List Patient Active Problem List   Diagnosis Date Noted  . Lymphedema of arm 10/14/2012  . Port catheter in place 04/16/2012  . COPD (chronic obstructive pulmonary disease) 10/09/2011  . Shortness of breath 03/11/2011  . Malignant neoplasm of upper-outer quadrant of female breast 12/22/2010  . BP (high blood pressure) 09/06/2010  . High cholesterol 09/06/2010  . Stage III (T4N3) R Br cancer  06/21/2010    PT Plan of Care PT Home Exercise Plan:  given   Lurena Nida, PTA/CLT 10/20/2012, 4:49 PM

## 2012-10-22 ENCOUNTER — Ambulatory Visit (HOSPITAL_COMMUNITY)
Admission: RE | Admit: 2012-10-22 | Discharge: 2012-10-22 | Disposition: A | Discharge status: Home / Self Care | Payer: 59 | Source: Ambulatory Visit | Attending: Physical Therapy | Admitting: Physical Therapy

## 2012-10-22 NOTE — Progress Notes (Signed)
Physical Therapy Treatment Patient Details  Name: Sheila Garrison MRN: [id number] Date of Birth: [birth year]  Today's Date: 10/22/2012 Time: 8295-6213 PT Time Calculation (min): 40 min Visit#: 4 of 12  Authorization: UMR  Charges:  Manual 38'  Subjective: Symptoms/Limitations Symptoms: Pt comes to therapy today with Rt UE compression sleeve.  STates it feels wonderful!  Pt reports she feels she is close to being done with therapy.   Objective: Manual Therapy Manual Therapy: Other (comment) Other Manual Therapy: MLD to Rt UE routing fluid to Lt axillary and Rt inguinal nodes.    Physical Therapy Assessment and Plan PT Assessment and Plan Clinical Impression Statement: MLD completed for Rt UE.  UE was not bandaged due to pt with compression garment.  Plan to remeasure tomorrow and determine progression toward goals. PT Plan: Re-measure next visit.     Problem List Patient Active Problem List   Diagnosis Date Noted  . Lymphedema of arm 10/14/2012  . Port catheter in place 04/16/2012  . COPD (chronic obstructive pulmonary disease) 10/09/2011  . Shortness of breath 03/11/2011  . Malignant neoplasm of upper-outer quadrant of female breast 12/22/2010  . BP (high blood pressure) 09/06/2010  . High cholesterol 09/06/2010  . Stage III (T4N3) R Br cancer  06/21/2010        Lurena Nida, PTA/CLT 10/22/2012, 5:40 PM

## 2012-10-23 ENCOUNTER — Ambulatory Visit (HOSPITAL_COMMUNITY)
Admission: RE | Admit: 2012-10-23 | Discharge: 2012-10-23 | Disposition: A | Discharge status: Home / Self Care | Payer: 59 | Source: Ambulatory Visit | Attending: Physical Therapy | Admitting: Physical Therapy

## 2012-10-23 NOTE — Progress Notes (Signed)
Physical Therapy Re-evaluation / Discharge  Patient Details  Name: Sheila Garrison MRN: [id number] Date of Birth: 08-20-1955  Today's Date: 10/23/2012 Time: 1640-1710 PT Time Calculation (min): 30 min        Visit#: 5 of 12  Authorization: UMR   Charges:  Manual 10', self care 15'    Subjective Symptoms/Limitations Symptoms: Pt reports continued comfort wearing her compression sleeve.  PT states she would like to be discharged from therapy.    Objective: Manual Therapy Manual Therapy: Other (comment) Other Manual Therapy: Measurement of Rt UE and self instruction for Rt UE MLD  Circumferential measurements of Rt/Lt UE in cm: Date 10/14/2012  10/23/2012   Right Left Right  MCP 19.20 18.30 18.4  WRIST 16.2 15.2 15.20  4cm 17.80 16.00 17.00  8cm 20.30 17.20 19.80  12 cm 22.80 20.20 23.50  16cm 26.00 23.50 26.00  20cm 27.50 24.20 27.00  24cm 27.50 25.30 26.50  28cm 28.10 27.70 26.70  32cm 29.00 28.40 29.00  36cm 33.00 32.20 33.00            Sum of squares 6787.96 5914.48 6553.03  Total Volume 2160.676 1882.638 2085.895     Physical Therapy Assessment and Plan PT Assessment and Plan Clinical Impression Statement: Pt has completed 5 treatment for manual lymph drainage, bandage training and basic lymphedema education.  Pt is completing HEP, is comfortable with self bandaging and massage and is wearing her Rt UE garment as instructed.  Pt requests to be discharged at this time.  Rt UE remeasured today with overall reduction of 74.78 cc.  Rt UE remains 203.26 cc greater than Lt UE, however pt reports she is happy with her progress and feels she can continue with self MLD.  Pt given written instructions for MLD and given information on compression tank tops to use for fluid under Rt UE and thoracic area.  PT without questions and concern. PT Plan: discharge to home maintenance phase per pt request.    Goals PT Short Term Goals PT Short Term Goal 1: Pt to be I in skin care and  care of bandages - Progress: Met PT Short Term Goal 2: pt to verbalize precautions for decreasing risk of infection - Progress: Met PT Short Term Goal 3: Pt to be independent with HEP to promote lymphatic circulation - Progress: Met PT Short Term Goal 4: reduce volume by 40% - Progress: Met  PT Long Term Goals Time to Complete Long Term Goals: 4 weeks PT Long Term Goal 1: reduce volume by 80% - Progress: Not met PT Long Term Goal 2: Pt to understand the maintenance phase of treatment - Progress: Met Long Term Goal 3: Pt to understand where and how to be fitted for a compression garment.- Progress: Met Long Term Goal 4: Pt to be Independent with self bandaging or with other night time compression- Progress: Met  Problem List Patient Active Problem List   Diagnosis Date Noted  . Lymphedema of arm 10/14/2012  . Port catheter in place 04/16/2012  . COPD (chronic obstructive pulmonary disease) 10/09/2011  . Shortness of breath 03/11/2011  . Malignant neoplasm of upper-outer quadrant of female breast 12/22/2010  . BP (high blood pressure) 09/06/2010  . High cholesterol 09/06/2010  . 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

## 2012-10-27 ENCOUNTER — Ambulatory Visit (HOSPITAL_COMMUNITY): Payer: 59 | Admitting: Physical Therapy

## 2012-10-29 ENCOUNTER — Ambulatory Visit (HOSPITAL_COMMUNITY): Payer: 59 | Admitting: Physical Therapy

## 2012-10-30 ENCOUNTER — Ambulatory Visit (HOSPITAL_COMMUNITY): Payer: 59 | Admitting: Physical Therapy

## 2012-11-03 ENCOUNTER — Ambulatory Visit (HOSPITAL_COMMUNITY): Payer: 59 | Admitting: Physical Therapy

## 2012-11-05 ENCOUNTER — Ambulatory Visit (HOSPITAL_COMMUNITY): Payer: 59 | Admitting: Physical Therapy

## 2012-11-06 ENCOUNTER — Ambulatory Visit (HOSPITAL_COMMUNITY): Payer: 59 | Admitting: Physical Therapy

## 2012-11-21 ENCOUNTER — Encounter (HOSPITAL_COMMUNITY): Payer: 59 | Attending: Oncology

## 2012-11-21 DIAGNOSIS — Z452 Encounter for adjustment and management of vascular access device: Secondary | ICD-10-CM

## 2012-11-21 DIAGNOSIS — Z95828 Presence of other vascular implants and grafts: Secondary | ICD-10-CM

## 2012-11-21 DIAGNOSIS — Z9889 Other specified postprocedural states: Secondary | ICD-10-CM | POA: Insufficient documentation

## 2012-11-21 DIAGNOSIS — C50119 Malignant neoplasm of central portion of unspecified female breast: Secondary | ICD-10-CM

## 2012-11-21 MED ORDER — HEPARIN SOD (PORK) LOCK FLUSH 100 UNIT/ML IV SOLN
500.0000 [IU] | Freq: Once | INTRAVENOUS | Status: AC
  Administered 2012-11-21: 500 [IU] via INTRAVENOUS

## 2012-11-21 MED ORDER — SODIUM CHLORIDE 0.9 % IJ SOLN
10.0000 mL | INTRAMUSCULAR | Status: DC | PRN
  Administered 2012-11-21: 10 mL via INTRAVENOUS

## 2012-11-21 MED ORDER — HEPARIN SOD (PORK) LOCK FLUSH 100 UNIT/ML IV SOLN
INTRAVENOUS | Status: AC
  Filled 2012-11-21: qty 5

## 2012-11-21 NOTE — Progress Notes (Signed)
Rejoice Lobdell presented for Portacath access and flush. Proper placement of portacath confirmed by CXR. Portacath located left chest wall accessed with  H 20 needle. Good blood return present. Portacath flushed with 20ml NS and 500U/5ml Heparin and needle removed intact. Procedure without incident. Patient tolerated procedure well.   

## 2012-12-02 ENCOUNTER — Ambulatory Visit (INDEPENDENT_AMBULATORY_CARE_PROVIDER_SITE_OTHER): Payer: 59 | Admitting: General Practice

## 2012-12-02 ENCOUNTER — Encounter: Payer: Self-pay | Admitting: General Practice

## 2012-12-02 VITALS — BP 126/76 | HR 81 | Temp 97.1°F | Ht 64.0 in | Wt 173.0 lb

## 2012-12-02 DIAGNOSIS — Z0000 Encounter for general adult medical examination without abnormal findings: Secondary | ICD-10-CM

## 2012-12-02 DIAGNOSIS — K219 Gastro-esophageal reflux disease without esophagitis: Secondary | ICD-10-CM

## 2012-12-02 DIAGNOSIS — F411 Generalized anxiety disorder: Secondary | ICD-10-CM

## 2012-12-02 DIAGNOSIS — I10 Essential (primary) hypertension: Secondary | ICD-10-CM

## 2012-12-02 DIAGNOSIS — E785 Hyperlipidemia, unspecified: Secondary | ICD-10-CM

## 2012-12-02 NOTE — Progress Notes (Signed)
  Subjective:    Patient ID: Sheila Garrison, female    DOB: Sep 01, 1955, 57 y.o.   MRN: [id number]  HPI Patient presents today for annual physical. She has a history of breast cancer, hypertension, anxiety, GERD, hyperlipidemia. She reports taking medications as directed. Denies regular exercise. She eats a regular diet.     Review of Systems  Constitutional: Negative for fever and chills.  Respiratory: Negative for chest tightness and shortness of breath.   Cardiovascular: Negative for chest pain and palpitations.  Gastrointestinal: Negative for nausea, vomiting, abdominal pain, diarrhea, constipation and blood in stool.  Genitourinary: Negative for dysuria, hematuria and difficulty urinating.  Musculoskeletal: Negative for back pain and joint swelling.  Neurological: Negative for dizziness, weakness and headaches.       Objective:   Physical Exam  Constitutional: She is oriented to person, place, and time. She appears well-developed and well-nourished.  HENT:  Head: Normocephalic and atraumatic.  Right Ear: External ear normal.  Left Ear: External ear normal.  Nose: Nose normal.  Mouth/Throat: Oropharynx is clear and moist.  Eyes: EOM are normal. Pupils are equal, round, and reactive to light.  Neck: Normal range of motion. Neck supple. No thyromegaly present.  Cardiovascular: Normal rate, regular rhythm and normal heart sounds.   Pulmonary/Chest: Effort normal and breath sounds normal. No respiratory distress. She exhibits no tenderness.  Abdominal: Soft. Bowel sounds are normal. She exhibits no distension. There is no tenderness.  Musculoskeletal: She exhibits no edema and no tenderness.  Lymphadenopathy:    She has no cervical adenopathy.  Neurological: She is alert and oriented to person, place, and time.  Skin: Skin is warm and dry.  Psychiatric: She has a normal mood and affect.          Assessment & Plan:  1. Hypertension  - CMP14+EGFR; Future  2.  Hyperlipidemia  - NMR, lipoprofile; Future  3. Annual physical exam  - POCT CBC; Future  4. GERD (gastroesophageal reflux disease)   5. Generalized anxiety disorder Continue all current medications Labs pending F/u in 3 months Discussed exercise and diet  Reports will schedule mammogram and pap Patient verbalized understanding Coralie Keens, FNP-C

## 2012-12-02 NOTE — Patient Instructions (Signed)
Hypertriglyceridemia  Diet for High blood levels of Triglycerides Most fats in food are triglycerides. Triglycerides in your blood are stored as fat in your body. High levels of triglycerides in your blood may put you at a greater risk for heart disease and stroke.  Normal triglyceride levels are less than 150 mg/dL. Borderline high levels are 150-199 mg/dl. High levels are 200 - 499 mg/dL, and very high triglyceride levels are greater than 500 mg/dL. The decision to treat high triglycerides is generally based on the level. For people with borderline or high triglyceride levels, treatment includes weight loss and exercise. Drugs are recommended for people with very high triglyceride levels. Many people who need treatment for high triglyceride levels have metabolic syndrome. This syndrome is a collection of disorders that often include: insulin resistance, high blood pressure, blood clotting problems, high cholesterol and triglycerides. TESTING PROCEDURE FOR TRIGLYCERIDES  You should not eat 4 hours before getting your triglycerides measured. The normal range of triglycerides is between 10 and 250 milligrams per deciliter (mg/dl). Some people may have extreme levels (1000 or above), but your triglyceride level may be too high if it is above 150 mg/dl, depending on what other risk factors you have for heart disease.  People with high blood triglycerides may also have high blood cholesterol levels. If you have high blood cholesterol as well as high blood triglycerides, your risk for heart disease is probably greater than if you only had high triglycerides. High blood cholesterol is one of the main risk factors for heart disease. CHANGING YOUR DIET  Your weight can affect your blood triglyceride level. If you are more than 20% above your ideal body weight, you may be able to lower your blood triglycerides by losing weight. Eating less and exercising regularly is the best way to combat this. Fat provides more  calories than any other food. The best way to lose weight is to eat less fat. Only 30% of your total calories should come from fat. Less than 7% of your diet should come from saturated fat. A diet low in fat and saturated fat is the same as a diet to decrease blood cholesterol. By eating a diet lower in fat, you may lose weight, lower your blood cholesterol, and lower your blood triglyceride level.  Eating a diet low in fat, especially saturated fat, may also help you lower your blood triglyceride level. Ask your dietitian to help you figure how much fat you can eat based on the number of calories your caregiver has prescribed for you.  Exercise, in addition to helping with weight loss may also help lower triglyceride levels.   Alcohol can increase blood triglycerides. You may need to stop drinking alcoholic beverages.  Too much carbohydrate in your diet may also increase your blood triglycerides. Some complex carbohydrates are necessary in your diet. These may include bread, rice, potatoes, other starchy vegetables and cereals.  Reduce "simple" carbohydrates. These may include pure sugars, candy, honey, and jelly without losing other nutrients. If you have the kind of high blood triglycerides that is affected by the amount of carbohydrates in your diet, you will need to eat less sugar and less high-sugar foods. Your caregiver can help you with this.  Adding 2-4 grams of fish oil (EPA+ DHA) may also help lower triglycerides. Speak with your caregiver before adding any supplements to your regimen. Following the Diet  Maintain your ideal weight. Your caregivers can help you with a diet. Generally, eating less food and getting more   exercise will help you lose weight. Joining a weight control group may also help. Ask your caregivers for a good weight control group in your area.  Eat low-fat foods instead of high-fat foods. This can help you lose weight too.  These foods are lower in fat. Eat MORE of these:    Dried beans, peas, and lentils.  Egg whites.  Low-fat cottage cheese.  Fish.  Lean cuts of meat, such as round, sirloin, rump, and flank (cut extra fat off meat you fix).  Whole grain breads, cereals and pasta.  Skim and nonfat dry milk.  Low-fat yogurt.  Poultry without the skin.  Cheese made with skim or part-skim milk, such as mozzarella, parmesan, farmers', ricotta, or pot cheese. These are higher fat foods. Eat LESS of these:   Whole milk and foods made from whole milk, such as American, blue, cheddar, monterey jack, and swiss cheese  High-fat meats, such as luncheon meats, sausages, knockwurst, bratwurst, hot dogs, ribs, corned beef, ground pork, and regular ground beef.  Fried foods. Limit saturated fats in your diet. Substituting unsaturated fat for saturated fat may decrease your blood triglyceride level. You will need to read package labels to know which products contain saturated fats.  These foods are high in saturated fat. Eat LESS of these:   Fried pork skins.  Whole milk.  Skin and fat from poultry.  Palm oil.  Butter.  Shortening.  Cream cheese.  Bacon.  Margarines and baked goods made from listed oils.  Vegetable shortenings.  Chitterlings.  Fat from meats.  Coconut oil.  Palm kernel oil.  Lard.  Cream.  Sour cream.  Fatback.  Coffee whiteners and non-dairy creamers made with these oils.  Cheese made from whole milk. Use unsaturated fats (both polyunsaturated and monounsaturated) moderately. Remember, even though unsaturated fats are better than saturated fats; you still want a diet low in total fat.  These foods are high in unsaturated fat:   Canola oil.  Sunflower oil.  Mayonnaise.  Almonds.  Peanuts.  Pine nuts.  Margarines made with these oils.  Safflower oil.  Olive oil.  Avocados.  Cashews.  Peanut butter.  Sunflower seeds.  Soybean oil.  Peanut  oil.  Olives.  Pecans.  Walnuts.  Pumpkin seeds. Avoid sugar and other high-sugar foods. This will decrease carbohydrates without decreasing other nutrients. Sugar in your food goes rapidly to your blood. When there is excess sugar in your blood, your liver may use it to make more triglycerides. Sugar also contains calories without other important nutrients.  Eat LESS of these:   Sugar, brown sugar, powdered sugar, jam, jelly, preserves, honey, syrup, molasses, pies, candy, cakes, cookies, frosting, pastries, colas, soft drinks, punches, fruit drinks, and regular gelatin.  Avoid alcohol. Alcohol, even more than sugar, may increase blood triglycerides. In addition, alcohol is high in calories and low in nutrients. Ask for sparkling water, or a diet soft drink instead of an alcoholic beverage. Suggestions for planning and preparing meals   Bake, broil, grill or roast meats instead of frying.  Remove fat from meats and skin from poultry before cooking.  Add spices, herbs, lemon juice or vinegar to vegetables instead of salt, rich sauces or gravies.  Use a non-stick skillet without fat or use no-stick sprays.  Cool and refrigerate stews and broth. Then remove the hardened fat floating on the surface before serving.  Refrigerate meat drippings and skim off fat to make low-fat gravies.  Serve more fish.  Use less butter,   margarine and other high-fat spreads on bread or vegetables.  Use skim or reconstituted non-fat dry milk for cooking.  Cook with low-fat cheeses.  Substitute low-fat yogurt or cottage cheese for all or part of the sour cream in recipes for sauces, dips or congealed salads.  Use half yogurt/half mayonnaise in salad recipes.  Substitute evaporated skim milk for cream. Evaporated skim milk or reconstituted non-fat dry milk can be whipped and substituted for whipped cream in certain recipes.  Choose fresh fruits for dessert instead of high-fat foods such as pies or  cakes. Fruits are naturally low in fat. When Dining Out   Order low-fat appetizers such as fruit or vegetable juice, pasta with vegetables or tomato sauce.  Select clear, rather than cream soups.  Ask that dressings and gravies be served on the side. Then use less of them.  Order foods that are baked, broiled, poached, steamed, stir-fried, or roasted.  Ask for margarine instead of butter, and use only a small amount.  Drink sparkling water, unsweetened tea or coffee, or diet soft drinks instead of alcohol or other sweet beverages. QUESTIONS AND ANSWERS ABOUT OTHER FATS IN THE BLOOD: SATURATED FAT, TRANS FAT, AND CHOLESTEROL What is trans fat? Trans fat is a type of fat that is formed when vegetable oil is hardened through a process called hydrogenation. This process helps makes foods more solid, gives them shape, and prolongs their shelf life. Trans fats are also called hydrogenated or partially hydrogenated oils.  What do saturated fat, trans fat, and cholesterol in foods have to do with heart disease? Saturated fat, trans fat, and cholesterol in the diet all raise the level of LDL "bad" cholesterol in the blood. The higher the LDL cholesterol, the greater the risk for coronary heart disease (CHD). Saturated fat and trans fat raise LDL similarly.  What foods contain saturated fat, trans fat, and cholesterol? High amounts of saturated fat are found in animal products, such as fatty cuts of meat, chicken skin, and full-fat dairy products like butter, whole milk, cream, and cheese, and in tropical vegetable oils such as palm, palm kernel, and coconut oil. Trans fat is found in some of the same foods as saturated fat, such as vegetable shortening, some margarines (especially hard or stick margarine), crackers, cookies, baked goods, fried foods, salad dressings, and other processed foods made with partially hydrogenated vegetable oils. Small amounts of trans fat also occur naturally in some animal  products, such as milk products, beef, and lamb. Foods high in cholesterol include liver, other organ meats, egg yolks, shrimp, and full-fat dairy products. How can I use the new food label to make heart-healthy food choices? Check the Nutrition Facts panel of the food label. Choose foods lower in saturated fat, trans fat, and cholesterol. For saturated fat and cholesterol, you can also use the Percent Daily Value (%DV): 5% DV or less is low, and 20% DV or more is high. (There is no %DV for trans fat.) Use the Nutrition Facts panel to choose foods low in saturated fat and cholesterol, and if the trans fat is not listed, read the ingredients and limit products that list shortening or hydrogenated or partially hydrogenated vegetable oil, which tend to be high in trans fat. POINTS TO REMEMBER:   Discuss your risk for heart disease with your caregivers, and take steps to reduce risk factors.  Change your diet. Choose foods that are low in saturated fat, trans fat, and cholesterol.  Add exercise to your daily routine if   it is not already being done. Participate in physical activity of moderate intensity, like brisk walking, for at least 30 minutes on most, and preferably all days of the week. No time? Break the 30 minutes into three, 10-minute segments during the day.  Stop smoking. If you do smoke, contact your caregiver to discuss ways in which they can help you quit.  Do not use street drugs.  Maintain a normal weight.  Maintain a healthy blood pressure.  Keep up with your blood work for checking the fats in your blood as directed by your caregiver. Document Released: 11/10/2003 Document Revised: 07/24/2011 Document Reviewed: 06/07/2008 [employer] Patient Information 2014 [employer].  

## 2012-12-03 ENCOUNTER — Other Ambulatory Visit (INDEPENDENT_AMBULATORY_CARE_PROVIDER_SITE_OTHER): Payer: 59

## 2012-12-03 DIAGNOSIS — E785 Hyperlipidemia, unspecified: Secondary | ICD-10-CM

## 2012-12-03 DIAGNOSIS — Z0000 Encounter for general adult medical examination without abnormal findings: Secondary | ICD-10-CM

## 2012-12-03 DIAGNOSIS — I10 Essential (primary) hypertension: Secondary | ICD-10-CM

## 2012-12-03 LAB — POCT CBC
Granulocyte percent: 73.1 %G (ref 37–80)
HCT, POC: 44.5 % (ref 37.7–47.9)
Lymph, poc: 2 (ref 0.6–3.4)
MCHC: 32.2 g/dL (ref 31.8–35.4)
MCV: 91.3 fL (ref 80–97)
POC Granulocyte: 7.2 — AB (ref 2–6.9)
POC LYMPH PERCENT: 20.1 %L (ref 10–50)
Platelet Count, POC: 335 10*3/uL (ref 142–424)
RDW, POC: 14.6 %

## 2012-12-03 NOTE — Progress Notes (Signed)
Patient came in for labs only.

## 2012-12-05 ENCOUNTER — Other Ambulatory Visit (INDEPENDENT_AMBULATORY_CARE_PROVIDER_SITE_OTHER): Payer: 59

## 2012-12-05 DIAGNOSIS — Z1212 Encounter for screening for malignant neoplasm of rectum: Secondary | ICD-10-CM

## 2012-12-05 LAB — CMP14+EGFR
ALT: 24 IU/L (ref 0–32)
Albumin: 4.6 g/dL (ref 3.5–5.5)
BUN: 13 mg/dL (ref 6–24)
CO2: 25 mmol/L (ref 18–29)
Chloride: 96 mmol/L — ABNORMAL LOW (ref 97–108)
Glucose: 92 mg/dL (ref 65–99)
Potassium: 4.5 mmol/L (ref 3.5–5.2)
Total Protein: 6.8 g/dL (ref 6.0–8.5)

## 2012-12-05 LAB — NMR, LIPOPROFILE
Cholesterol: 168 mg/dL (ref <200)
LDL Particle Number: 1425 nmol/L — ABNORMAL HIGH (ref <1000)
LDLC SERPL CALC-MCNC: 70 mg/dL (ref <100)
LP-IR Score: 73 — ABNORMAL HIGH (ref <=45)
Triglycerides by NMR: 213 mg/dL — ABNORMAL HIGH (ref <150)

## 2012-12-05 NOTE — Progress Notes (Signed)
Patient dropped off fobt 

## 2012-12-31 ENCOUNTER — Encounter (HOSPITAL_COMMUNITY): Payer: 59 | Attending: Oncology

## 2012-12-31 DIAGNOSIS — C50119 Malignant neoplasm of central portion of unspecified female breast: Secondary | ICD-10-CM

## 2012-12-31 DIAGNOSIS — Z452 Encounter for adjustment and management of vascular access device: Secondary | ICD-10-CM

## 2012-12-31 DIAGNOSIS — Z95828 Presence of other vascular implants and grafts: Secondary | ICD-10-CM

## 2012-12-31 DIAGNOSIS — Z9889 Other specified postprocedural states: Secondary | ICD-10-CM | POA: Insufficient documentation

## 2012-12-31 MED ORDER — SODIUM CHLORIDE 0.9 % IJ SOLN
10.0000 mL | INTRAMUSCULAR | Status: DC | PRN
  Administered 2012-12-31: 10 mL via INTRAVENOUS

## 2012-12-31 MED ORDER — HEPARIN SOD (PORK) LOCK FLUSH 100 UNIT/ML IV SOLN
500.0000 [IU] | Freq: Once | INTRAVENOUS | Status: AC
  Administered 2012-12-31: 500 [IU] via INTRAVENOUS
  Filled 2012-12-31: qty 5

## 2012-12-31 NOTE — Progress Notes (Signed)
Sheila Garrison presented for Portacath access and flush. Proper placement of portacath confirmed by CXR. Portacath located left chest wall accessed with  H 20 needle. Good blood return present. Portacath flushed with 20ml NS and 500U/28ml Heparin and needle removed intact. Procedure without incident. Patient tolerated procedure well.

## 2013-01-05 ENCOUNTER — Other Ambulatory Visit (HOSPITAL_COMMUNITY): Payer: Self-pay | Admitting: Oncology

## 2013-01-09 ENCOUNTER — Encounter (HOSPITAL_COMMUNITY): Payer: Self-pay | Admitting: Pharmacy Technician

## 2013-01-12 ENCOUNTER — Other Ambulatory Visit: Payer: Self-pay

## 2013-01-12 ENCOUNTER — Encounter (HOSPITAL_COMMUNITY)
Admission: RE | Admit: 2013-01-12 | Discharge: 2013-01-12 | Disposition: A | Discharge status: Home / Self Care | Payer: 59 | Source: Ambulatory Visit | Attending: General Surgery | Admitting: General Surgery

## 2013-01-12 ENCOUNTER — Encounter (HOSPITAL_COMMUNITY): Payer: Self-pay

## 2013-01-12 HISTORY — DX: Anxiety disorder, unspecified: F41.9

## 2013-01-12 HISTORY — DX: Chronic obstructive pulmonary disease, unspecified: J44.9

## 2013-01-12 LAB — CBC WITH DIFFERENTIAL/PLATELET
Basophils Absolute: 0 10*3/uL (ref 0.0–0.1)
Basophils Relative: 0 % (ref 0–1)
Eosinophils Absolute: 0.3 10*3/uL (ref 0.0–0.7)
Eosinophils Relative: 3 % (ref 0–5)
HCT: 42.7 % (ref 36.0–46.0)
Lymphocytes Relative: 31 % (ref 12–46)
MCHC: 32.1 g/dL (ref 30.0–36.0)
MCV: 94.5 fL (ref 78.0–100.0)
Monocytes Absolute: 0.7 10*3/uL (ref 0.1–1.0)
Platelets: 321 10*3/uL (ref 150–400)
RDW: 14.4 % (ref 11.5–15.5)
WBC: 7.6 10*3/uL (ref 4.0–10.5)

## 2013-01-12 LAB — BASIC METABOLIC PANEL
CO2: 30 mEq/L (ref 19–32)
Calcium: 9.5 mg/dL (ref 8.4–10.5)
Creatinine, Ser: 0.55 mg/dL (ref 0.50–1.10)
GFR calc Af Amer: >90 mL/min (ref >90)
GFR calc non Af Amer: >90 mL/min (ref >90)
Sodium: 141 mEq/L (ref 135–145)

## 2013-01-12 NOTE — Patient Instructions (Signed)
PATIENT INSTRUCTIONS POST-ANESTHESIA  IMMEDIATELY FOLLOWING SURGERY:  Do not drive or operate machinery for the first twenty four hours after surgery.  Do not make any important decisions for twenty four hours after surgery or while taking narcotic pain medications or sedatives.  If you develop intractable nausea and vomiting or a severe headache please notify your doctor immediately.  FOLLOW-UP:  Please make an appointment with your surgeon as instructed. You do not need to follow up with anesthesia unless specifically instructed to do so.  WOUND CARE INSTRUCTIONS (if applicable):  Keep a dry clean dressing on the anesthesia/puncture wound site if there is drainage.  Once the wound has quit draining you may leave it open to air.  Generally you should leave the bandage intact for twenty four hours unless there is drainage.  If the epidural site drains for more than 36-48 hours please call the anesthesia department.  QUESTIONS?:  Please feel free to call your physician or the hospital operator if you have any questions, and they will be happy to assist you.        Sheila Garrison  01/12/2013   Your procedure is scheduled on:  Thursday, 01/15/13  Report to [facility] at 0730 AM.  Call this number if you have problems the morning of surgery: [phone]   Remember:   Do not eat food or drink liquids after midnight.   Take these medicines the morning of surgery with A SIP OF WATER: OMEPRAZOLE, HYZAAR, ATIVAN IF NEEDED.   Do not wear jewelry, make-up or nail polish.  Do not wear lotions, powders, or perfumes. You may wear deodorant.  Do not shave 48 hours prior to surgery. Men may shave face and neck.  Do not bring valuables to the hospital.  [facility] is not responsible                  for any belongings or valuables.               Contacts, dentures or bridgework may not be worn into surgery.  Leave suitcase in the car. After surgery it may be brought to your room.  For patients  admitted to the hospital, discharge time is determined by your                treatment team.               Patients discharged the day of surgery will not be allowed to drive  home.  Name and phone number of your driver: HUSBAND  Special Instructions: Shower using CHG 2 nights before surgery and the night before surgery.  If you shower the day of surgery use CHG.  Use special wash - you have one bottle of CHG for all showers.  You should use approximately 1/3 of the bottle for each shower.   Please read over the following fact sheets that you were given: Anesthesia Post-op Instructions and Care and Recovery After Surgery

## 2013-01-12 NOTE — Consult Note (Signed)
NAMEVANESHA, Sheila Garrison NO.:  [id number]  MEDICAL RECORD NO.:  [id number]  LOCATION:                                 FACILITY:  PHYSICIAN:  Barbaraann Barthel, M.D. DATE OF BIRTH:  April 27, 1955  DATE OF CONSULTATION:  01/09/2013 DATE OF DISCHARGE:                                CONSULTATION   NOTE:  Surgery was asked to see this 57 year old white female for removal of her Port-A-Cath.  This has been in place for the last 2 years after she was treated with modified radical mastectomy and chemotherapy in 2012.  She has been referred for removal of the Port-A-Cath.  PAST MEDICAL HISTORY:  History positive for anxiety, hypertension, GERD and hypercholesterolemia.  PAST SURGERIES:  Have included modified radical mastectomy in October 2012, and the patient has had a tubal ligation some 30 years ago.  ALLERGIES:  She has no known allergies.  MEDICATIONS:  For medication list, see medication list.  SOCIAL HISTORY:  The patient is a occasional smoker 1 or 2 cigarettes a day she thinks.  No alcohol abuse.  HISTORY AND PHYSICAL:  VITAL SIGNS:  She is 5 feet, 4-1/2 inches tall, weighs 173 pounds.  Temperature is 98.7, pulse 94, respirations 12, blood pressure 120/70, O2 sat 97%. HEENT:  Head is normocephalic.  Eyes, extraocular movements are intact. Pupils are round and reactive to light and accommodation.  There is no conjunctival pallor or scleral icterus.  The patient has no adenopathy appreciated.  No jugular vein distention or thyromegaly.  The patient has a dental prosthesis. CHEST:  Clear. HEART:  Regular rhythm. BREASTS:  The patient has had a previous right modified radical mastectomy.  There is no obvious local recurrence here.  The left breast is without masses and the axilla is without masses there. ABDOMEN:  Soft.  There is no visceromegaly.  No other hernias. RECTAL AND PELVIC:  Deferred. EXTREMITIES:  She has mild lymphedema in the right arm.  REVIEW  OF SYSTEMS:  NEUROLOGICAL:  No history of migraines or seizures. She does have anxiety.  No lateralizing neurological findings. ENDOCRINE:  No history of diabetes, thyroid disease or adrenal problems. CARDIOPULMONARY:  The patient has a history of hypertension and hypercholesterolemia. MUSCULOSKELETAL:  Grossly within normal limits.  OB/GYN HISTORY:  She is a gravida 4, para 2, abortus 2.  She has no family history of breast carcinoma.  She has had obviously breast carcinoma and mammogram.  Her last mammogram was in October 14th, of her left breast and this was reported as normal.  GI:  She had guaiac testing done recently and this negative.  She has a history of GERD.  No history of hepatitis, constipation, diarrhea, bright red rectal bleeding, or melena.  No history of inflammatory bowel disease or irritable bowel syndrome.  No history of unexplained weight loss.  She has not had a colonoscopy.  GU: No history of frequency, dysuria, or kidney stones.  REVIEW OF HISTORY AND PHYSICAL:  Therefore, Ms. Dower is a 57 year old white female who has completed her treatment for a carcinoma of the breast, and we will plan to remove her Port-A-Cath as per her desires and as per  the referral by the Oncology Department.  We discussed complications not limited to, but including bleeding, infection, and informed consent was obtained.     Barbaraann Barthel, M.D.     WB/MEDQ  D:  01/09/2013  T:  01/10/2013  Job:  [id number]

## 2013-01-15 ENCOUNTER — Encounter (HOSPITAL_COMMUNITY)
Admission: RE | Disposition: A | Discharge status: Home / Self Care | Payer: Self-pay | Source: Ambulatory Visit | Attending: General Surgery

## 2013-01-15 ENCOUNTER — Ambulatory Visit (HOSPITAL_COMMUNITY)
Admission: RE | Admit: 2013-01-15 | Discharge: 2013-01-15 | Disposition: A | Discharge status: Home / Self Care | Payer: 59 | Source: Ambulatory Visit | Attending: General Surgery | Admitting: General Surgery

## 2013-01-15 ENCOUNTER — Encounter (HOSPITAL_COMMUNITY): Payer: 59 | Admitting: Certified Registered"

## 2013-01-15 ENCOUNTER — Encounter (HOSPITAL_COMMUNITY): Payer: Self-pay | Admitting: *Deleted

## 2013-01-15 ENCOUNTER — Ambulatory Visit (HOSPITAL_COMMUNITY): Payer: 59 | Admitting: Certified Registered"

## 2013-01-15 DIAGNOSIS — I10 Essential (primary) hypertension: Secondary | ICD-10-CM | POA: Insufficient documentation

## 2013-01-15 DIAGNOSIS — J449 Chronic obstructive pulmonary disease, unspecified: Secondary | ICD-10-CM | POA: Insufficient documentation

## 2013-01-15 DIAGNOSIS — J4489 Other specified chronic obstructive pulmonary disease: Secondary | ICD-10-CM | POA: Insufficient documentation

## 2013-01-15 DIAGNOSIS — Z452 Encounter for adjustment and management of vascular access device: Secondary | ICD-10-CM | POA: Insufficient documentation

## 2013-01-15 DIAGNOSIS — C50112 Malignant neoplasm of central portion of left female breast: Secondary | ICD-10-CM

## 2013-01-15 HISTORY — PX: PORT-A-CATH REMOVAL: SHX5289

## 2013-01-15 SURGERY — REMOVAL PORT-A-CATH
Anesthesia: Monitor Anesthesia Care | Site: Chest | Laterality: Left

## 2013-01-15 MED ORDER — ONDANSETRON HCL 4 MG/2ML IJ SOLN
4.0000 mg | Freq: Once | INTRAMUSCULAR | Status: AC
  Administered 2013-01-15: 4 mg via INTRAVENOUS

## 2013-01-15 MED ORDER — PROPOFOL 10 MG/ML IV BOLUS
INTRAVENOUS | Status: DC | PRN
  Administered 2013-01-15: 50 mg via INTRAVENOUS
  Administered 2013-01-15: 20 mg via INTRAVENOUS

## 2013-01-15 MED ORDER — LIDOCAINE HCL (PF) 1 % IJ SOLN
INTRAMUSCULAR | Status: AC
  Filled 2013-01-15: qty 30

## 2013-01-15 MED ORDER — LACTATED RINGERS IV SOLN
INTRAVENOUS | Status: DC
  Administered 2013-01-15: 09:00:00 via INTRAVENOUS

## 2013-01-15 MED ORDER — CEFAZOLIN SODIUM 1-5 GM-% IV SOLN
1.0000 g | INTRAVENOUS | Status: AC
  Administered 2013-01-15: 2 g via INTRAVENOUS

## 2013-01-15 MED ORDER — PHENYLEPHRINE HCL 10 MG/ML IJ SOLN
INTRAMUSCULAR | Status: DC | PRN
  Administered 2013-01-15: 100 ug via INTRAVENOUS

## 2013-01-15 MED ORDER — WATER FOR IRRIGATION, STERILE IR SOLN
Status: DC | PRN
  Administered 2013-01-15 (×2): 1000 mL

## 2013-01-15 MED ORDER — PROPOFOL INFUSION 10 MG/ML OPTIME
INTRAVENOUS | Status: DC | PRN
  Administered 2013-01-15: 120 ug/kg/min via INTRAVENOUS

## 2013-01-15 MED ORDER — ONDANSETRON HCL 4 MG/2ML IJ SOLN
INTRAMUSCULAR | Status: AC
  Filled 2013-01-15: qty 2

## 2013-01-15 MED ORDER — FENTANYL CITRATE 0.05 MG/ML IJ SOLN
25.0000 ug | INTRAMUSCULAR | Status: AC
  Administered 2013-01-15: 25 ug via INTRAVENOUS

## 2013-01-15 MED ORDER — MIDAZOLAM HCL 2 MG/2ML IJ SOLN
1.0000 mg | INTRAMUSCULAR | Status: DC | PRN
  Administered 2013-01-15: 2 mg via INTRAVENOUS

## 2013-01-15 MED ORDER — BACITRACIN-NEOMYCIN-POLYMYXIN 400-5-5000 EX OINT
TOPICAL_OINTMENT | CUTANEOUS | Status: AC
  Filled 2013-01-15: qty 1

## 2013-01-15 MED ORDER — CEFAZOLIN SODIUM-DEXTROSE 2-3 GM-% IV SOLR
INTRAVENOUS | Status: AC
  Filled 2013-01-15: qty 50

## 2013-01-15 MED ORDER — SODIUM CHLORIDE 0.9 % IR SOLN
Status: DC | PRN
  Administered 2013-01-15: 1000 mL

## 2013-01-15 MED ORDER — PROPOFOL 10 MG/ML IV BOLUS
INTRAVENOUS | Status: AC
  Filled 2013-01-15: qty 20

## 2013-01-15 MED ORDER — BACITRACIN-NEOMYCIN-POLYMYXIN 400-5-5000 EX OINT
TOPICAL_OINTMENT | CUTANEOUS | Status: DC | PRN
  Administered 2013-01-15: 1 via TOPICAL

## 2013-01-15 MED ORDER — MIDAZOLAM HCL 2 MG/2ML IJ SOLN
INTRAMUSCULAR | Status: AC
  Filled 2013-01-15: qty 2

## 2013-01-15 MED ORDER — FENTANYL CITRATE 0.05 MG/ML IJ SOLN
INTRAMUSCULAR | Status: AC
  Filled 2013-01-15: qty 2

## 2013-01-15 MED ORDER — LIDOCAINE HCL (PF) 1 % IJ SOLN
INTRAMUSCULAR | Status: DC | PRN
  Administered 2013-01-15: 5 mL

## 2013-01-15 SURGICAL SUPPLY — 41 items
BAG HAMPER (MISCELLANEOUS) ×2 IMPLANT
BLADE SURG 15 STRL LF DISP TIS (BLADE) ×2 IMPLANT
BLADE SURG 15 STRL SS (BLADE) ×2
CLEANER TIP ELECTROSURG 2X2 (MISCELLANEOUS) ×2 IMPLANT
CLOTH BEACON ORANGE TIMEOUT ST (SAFETY) ×2 IMPLANT
COVER LIGHT HANDLE STERIS (MISCELLANEOUS) ×4 IMPLANT
DRAPE LAPAROTOMY TRNSV 102X78 (DRAPE) ×2 IMPLANT
DRSG TEGADERM 2-3/8X2-3/4 SM (GAUZE/BANDAGES/DRESSINGS) ×4 IMPLANT
ELECT REM PT RETURN 9FT ADLT (ELECTROSURGICAL) ×2
ELECTRODE REM PT RTRN 9FT ADLT (ELECTROSURGICAL) ×1 IMPLANT
GAUZE SPONGE 4X4 16PLY XRAY LF (GAUZE/BANDAGES/DRESSINGS) ×2 IMPLANT
GLOVE BIOGEL PI IND STRL 7.0 (GLOVE) ×1 IMPLANT
GLOVE BIOGEL PI INDICATOR 7.0 (GLOVE) ×1
GLOVE ECLIPSE 6.5 STRL STRAW (GLOVE) ×2 IMPLANT
GLOVE EXAM NITRILE LRG STRL (GLOVE) ×2 IMPLANT
GLOVE SKINSENSE NS SZ7.0 (GLOVE) ×1
GLOVE SKINSENSE STRL SZ7.0 (GLOVE) ×1 IMPLANT
GOWN STRL REIN XL XLG (GOWN DISPOSABLE) ×4 IMPLANT
KIT BLADEGUARD II DBL (SET/KITS/TRAYS/PACK) ×2 IMPLANT
KIT ROOM TURNOVER APOR (KITS) ×2 IMPLANT
MANIFOLD NEPTUNE II (INSTRUMENTS) ×2 IMPLANT
MARKER SKIN DUAL TIP RULER LAB (MISCELLANEOUS) ×2 IMPLANT
NEEDLE HYPO 25X1 1.5 SAFETY (NEEDLE) ×2 IMPLANT
NS IRRIG 1000ML POUR BTL (IV SOLUTION) ×2 IMPLANT
PACK BASIC III (CUSTOM PROCEDURE TRAY) ×1
PACK SRG BSC III STRL LF ECLPS (CUSTOM PROCEDURE TRAY) ×1 IMPLANT
PAD ARMBOARD 7.5X6 YLW CONV (MISCELLANEOUS) ×2 IMPLANT
SET BASIN LINEN APH (SET/KITS/TRAYS/PACK) ×2 IMPLANT
SOL PREP PROV IODINE SCRUB 4OZ (MISCELLANEOUS) ×2 IMPLANT
SPONGE GAUZE 2X2 8PLY STRL LF (GAUZE/BANDAGES/DRESSINGS) ×2 IMPLANT
STRIP CLOSURE SKIN 1/4X3 (GAUZE/BANDAGES/DRESSINGS) ×2 IMPLANT
SUT VIC AB 3-0 SH 27 (SUTURE) ×1
SUT VIC AB 3-0 SH 27X BRD (SUTURE) ×1 IMPLANT
SUT VIC AB 4-0 PS2 27 (SUTURE) ×2 IMPLANT
SUT VIC AB 5-0 P-3 18X BRD (SUTURE) ×1 IMPLANT
SUT VIC AB 5-0 P3 18 (SUTURE) ×1
SUT VICRYL AB 3 0 TIES (SUTURE) ×2 IMPLANT
SYR BULB IRRIGATION 50ML (SYRINGE) ×2 IMPLANT
SYR CONTROL 10ML LL (SYRINGE) ×2 IMPLANT
WATER STERILE IRR 1000ML POUR (IV SOLUTION) ×4 IMPLANT
YANKAUER SUCT 12FT TUBE ARGYLE (SUCTIONS) ×2 IMPLANT

## 2013-01-15 NOTE — Progress Notes (Signed)
57 yr. Old W. Female 2 yrs S/P treatment of CA of R breast for removal of porta cath.  Labs reviewed and procedure and risks discussed and informed consent obtained.  No change in H&P, dict # [id number].  Filed Vitals:   01/15/13 0940  BP: 105/57  Pulse:   Temp:   Resp: 21  temp 98.2, HR 88/min, O2 sat 93%

## 2013-01-15 NOTE — OR Nursing (Signed)
Dentures placd in pacu

## 2013-01-15 NOTE — Preoperative (Signed)
Beta Blockers   Reason not to administer Beta Blockers:Not Applicable 

## 2013-01-15 NOTE — Transfer of Care (Signed)
Immediate Anesthesia Transfer of Care Note  Patient: Sheila Garrison  Procedure(s) Performed: Procedure(s): REMOVAL PORT-A-CATH (Left)  Patient Location: PACU  Anesthesia Type:MAC  Level of Consciousness: awake, alert  and oriented  Airway & Oxygen Therapy: Patient Spontanous Breathing and Patient connected to nasal cannula oxygen  Post-op Assessment: Report given to PACU RN and Post -op Vital signs reviewed and stable  Post vital signs: Reviewed and stable  Complications: No apparent anesthesia complications

## 2013-01-15 NOTE — Anesthesia Procedure Notes (Signed)
Procedure Name: MAC Date/Time: 01/15/2013 10:09 AM Performed by: Glend[place] Score A Pre-anesthesia Checklist: Patient identified, Emergency Drugs available, Suction available and Patient being monitored Patient Re-evaluated:Patient Re-evaluated prior to inductionOxygen Delivery Method: Nasal cannula Placement Confirmation: positive ETCO2

## 2013-01-15 NOTE — Anesthesia Postprocedure Evaluation (Signed)
  Anesthesia Post-op Note  Patient: Sheila Garrison  Procedure(s) Performed: Procedure(s): REMOVAL PORT-A-CATH (Left)  Patient Location: PACU  Anesthesia Type:MAC  Level of Consciousness: awake, alert  and oriented  Airway and Oxygen Therapy: Patient Spontanous Breathing and Patient connected to nasal cannula oxygen  Post-op Pain: none  Post-op Assessment: Post-op Vital signs reviewed, Patient's Cardiovascular Status Stable, Respiratory Function Stable, Patent Airway and No signs of Nausea or vomiting  Post-op Vital Signs: Reviewed and stable  Complications: No apparent anesthesia complications

## 2013-01-15 NOTE — Anesthesia Preprocedure Evaluation (Signed)
Anesthesia Evaluation  Patient identified by MRN, date of birth, ID band Patient awake    Reviewed: Allergy & Precautions, H&P , NPO status , Patient's Chart, lab work & pertinent test results  Airway Mallampati: I TM Distance: >3 FB     Dental  (+) Edentulous Upper and Edentulous Lower   Pulmonary shortness of breath, COPDformer smoker,  breath sounds clear to auscultation        Cardiovascular hypertension, Pt. on medications Rhythm:Regular Rate:Normal     Neuro/Psych PSYCHIATRIC DISORDERS Anxiety    GI/Hepatic GERD-  Controlled and Medicated,  Endo/Other    Renal/GU      Musculoskeletal   Abdominal   Peds  Hematology   Anesthesia Other Findings   Reproductive/Obstetrics                           Anesthesia Physical Anesthesia Plan  ASA: III  Anesthesia Plan: MAC   Post-op Pain Management:    Induction: Intravenous  Airway Management Planned: Simple Face Mask  Additional Equipment:   Intra-op Plan:   Post-operative Plan:   Informed Consent: I have reviewed the patients History and Physical, chart, labs and discussed the procedure including the risks, benefits and alternatives for the proposed anesthesia with the patient or authorized representative who has indicated his/her understanding and acceptance.     Plan Discussed with:   Anesthesia Plan Comments:         Anesthesia Quick Evaluation

## 2013-01-15 NOTE — Brief Op Note (Signed)
01/15/2013  10:42 AM  PATIENT:  Sheila Garrison  57 y.o. female  PRE-OPERATIVE DIAGNOSIS:  invasive ductal carcinoma right breast  POST-OPERATIVE DIAGNOSIS:  invasive ductal carcinoma right breast  PROCEDURE:  Procedure(s): REMOVAL PORT-A-CATH (Left)  SURGEON:  Surgeon(s) and Role:    * Marlane Hatcher, MD - Primary  PHYSICIAN ASSISTANT:   ASSISTANTS: none   ANESTHESIA:   IV sedation  EBL:  Total I/O In: 500 [I.V.:500] Out: -   BLOOD ADMINISTERED:none  DRAINS: none   LOCAL MEDICATIONS USED:  XYLOCAINE 1 %  ~ 5 cc.  SPECIMEN:  Source of Specimen:  Left subclavian porta cath (not sent to path).  DISPOSITION OF SPECIMEN:  N/A  COUNTS:  YES  TOURNIQUET:  * No tourniquets in log *  DICTATION: .Other Dictation: Dictation Number OR Dict # [id number].  PLAN OF CARE: Discharge to home after PACU  PATIENT DISPOSITION:  PACU - hemodynamically stable.   Delay start of Pharmacological VTE agent (>24hrs) due to surgical blood loss or risk of bleeding: not applicable

## 2013-01-16 ENCOUNTER — Encounter (HOSPITAL_COMMUNITY): Payer: Self-pay | Admitting: General Surgery

## 2013-01-16 NOTE — Op Note (Signed)
NAMEYOSSELYN, TAX               ACCOUNT NO.:  [id number]  MEDICAL RECORD NO.:  [id number]  LOCATION:  [facility]                          FACILITY:  [facility]  PHYSICIAN:  Barbaraann Barthel, M.D. DATE OF BIRTH:  Jan 12, 1956  DATE OF PROCEDURE:  01/15/2013 DATE OF DISCHARGE:  01/15/2013                              OPERATIVE REPORT   SURGEON:  Barbaraann Barthel, MD  PREOPERATIVE DIAGNOSIS:  Status post treatment of right carcinoma of the breast.  PROCEDURE:  Removal of left subclavian Port-A-Cath.  WOUND CLASSIFICATION:  Cleaned.  SPECIMEN:  Port-A-Cath (not sent to pathology).  GROSS OPERATIVE FINDINGS:  The usual pseudocapsule around the infusion device, this was removed along with some Prolene sutures.  TECHNIQUE:  The patient was placed in a supine position after some IV sedation.  I used approximately 5 mL of 1% Xylocaine without epinephrine anesthetizing an area around the Port-A-Cath.  An incision was made transversely.  The Port-A-Cath was removed without a problem, and the pseudocapsule around the actual silastic catheter was clamped and ligated with 2-0 silk after removing the silastic catheter.  There were some sutures that were removed along with some portion of the pseudocapsule that was adherent to the Prolene sutures placed during the implantation of the device.  The wound was then irrigated and the skin was approximated subcutaneously with 3-0 Polysorb and the skin was closed subcutaneously with 5-0 Polysorb sutures with quarter-inch Steri- Strips, 2 x 2s, Neosporin dressing applied.  Prior to closure, all sponge, needle, and instrument counts were found to be correct.  The estimated blood loss was minimal.  No drains were placed.  There were no complications.     Barbaraann Barthel, M.D.     WB/MEDQ  D:  01/15/2013  T:  01/16/2013  Job:  [id number]  cc:   [facility]

## 2013-02-13 ENCOUNTER — Encounter (HOSPITAL_COMMUNITY): Payer: 59

## 2013-02-13 ENCOUNTER — Ambulatory Visit (HOSPITAL_COMMUNITY): Payer: 59 | Admitting: Oncology

## 2013-02-19 NOTE — Progress Notes (Signed)
-   No show, letter sent- Teresita Fanton   

## 2013-02-20 ENCOUNTER — Ambulatory Visit (HOSPITAL_COMMUNITY): Payer: 59 | Admitting: Oncology

## 2013-03-02 ENCOUNTER — Other Ambulatory Visit (HOSPITAL_COMMUNITY): Payer: 59

## 2013-03-31 ENCOUNTER — Other Ambulatory Visit: Payer: Self-pay | Admitting: General Practice

## 2013-03-31 ENCOUNTER — Other Ambulatory Visit (HOSPITAL_COMMUNITY): Payer: Self-pay | Admitting: Oncology

## 2013-03-31 ENCOUNTER — Telehealth: Payer: Self-pay | Admitting: General Practice

## 2013-03-31 ENCOUNTER — Telehealth (HOSPITAL_COMMUNITY): Payer: Self-pay | Admitting: Oncology

## 2013-03-31 DIAGNOSIS — E785 Hyperlipidemia, unspecified: Secondary | ICD-10-CM

## 2013-03-31 DIAGNOSIS — K219 Gastro-esophageal reflux disease without esophagitis: Secondary | ICD-10-CM

## 2013-03-31 DIAGNOSIS — C50119 Malignant neoplasm of central portion of unspecified female breast: Secondary | ICD-10-CM

## 2013-03-31 DIAGNOSIS — E876 Hypokalemia: Secondary | ICD-10-CM

## 2013-03-31 DIAGNOSIS — I10 Essential (primary) hypertension: Secondary | ICD-10-CM

## 2013-03-31 MED ORDER — OMEPRAZOLE 40 MG PO CPDR: 40.0000 mg | DELAYED_RELEASE_CAPSULE | Freq: Every day | ORAL | Status: DC

## 2013-03-31 MED ORDER — LETROZOLE 2.5 MG PO TABS: 2.5000 mg | ORAL_TABLET | Freq: Every day | ORAL | Status: DC

## 2013-03-31 MED ORDER — POTASSIUM CHLORIDE CRYS ER 20 MEQ PO TBCR: 20.0000 meq | EXTENDED_RELEASE_TABLET | Freq: Two times a day (BID) | ORAL | Status: DC

## 2013-03-31 MED ORDER — ROSUVASTATIN CALCIUM 10 MG PO TABS: 10.0000 mg | ORAL_TABLET | Freq: Every day | ORAL | Status: DC

## 2013-03-31 MED ORDER — LOSARTAN POTASSIUM-HCTZ 100-12.5 MG PO TABS: 1.0000 | ORAL_TABLET | Freq: Every day | ORAL | Status: DC

## 2013-03-31 NOTE — Telephone Encounter (Signed)
Scripts sent

## 2013-04-22 NOTE — Progress Notes (Signed)
-  Rescheduled-  KEFALAS,THOMAS  

## 2013-04-23 ENCOUNTER — Ambulatory Visit (HOSPITAL_COMMUNITY): Payer: 59 | Admitting: Oncology

## 2013-05-09 NOTE — Progress Notes (Addendum)
Sheila Garrison, [place] [address]  Stage III [phone]) R Br cancer   Cancer of central portion of female breast, right - Plan: CBC with Differential, Comprehensive metabolic panel  CURRENT THERAPY:On Letrozole beginning on 03/09/2011  INTERVAL HISTORY: Sheila Garrison 58 y.o. female returns for  regular  visit for followup of stage III C. (T4, N3) right-sided breast cancer. She had apparent axillary as well as intramammary lymph node involvement and suspicious ipsilateral subclavicular lymph node involvement. She took 6 cycles of carboplatin and docetaxel with trastuzumab every 21 days followed by surgery. She then had radiation therapy and finished one full year of trastuzumab (finishing on 07/10/2011). She then started on letrozole 2.5 mg on 03/09/2011   I personally reviewed and went over radiographic studies with the patient.  Sheila Garrison had her screening mammogram on 12/30/2012 and this is scanned into [facility].  She will be due in Nov 2015.  I personally reviewed and went over laboratory results with the patient.  Results follow below.  She is S/P port-a-cath removal by Dr. Romona Curls on 01/15/2013.  NCCN guidelines recommends the following surveillance for invasive breast cancer:  A. History and Physical exam every 4-6 months for 5 years and then every 12 months.  B. Mammography every 12 months  C. Women on Tamoxifen: annual gynecologic assessment every 12 months if uterus is present.  D. Women on aromatase inhibitor or who experience ovarian failure secondary to treatment should have monitoring of bone health with a bone mineral density determination at baseline and periodically thereafter.  E. Assess and encourage adherence to adjuvant endocrine therapy.  F. Evidence suggests that active lifestyle and achieving and maintaining an ideal body weight (20-25 BMI) may lead to optimal breast cancer outcomes.   She is tolerating Letrozole well without any complaints.   Oncologically,  she denies any complaints and ROS questioning is negative.   Past Medical History  Diagnosis Date  . Hypertension   . Nipple discharge     with pain, infection, lump  . MRSA (methicillin resistant staph aureus) culture positive     in breast  . Wears dentures   . Breast CA 07/01/2010    rt breast ca  . Family history of breast cancer     Aunt on father's side  . Neuropathy     bilateral toes  . Hyperlipemia   . Status post chemotherapy     6 cycles of carboplatin and docetaxel with trastuzumab every 21 days followed by surgery  . Use of letrozole (Femara) 03/09/2011  . S/P radiation therapy 01/08/11 - 02/22/11    Right Chest Wall/ 50 Gy / 25 Fractions, Right [place]/ 50 gy/25 Fractions, Right Axillary Boost/500 cGy/25 Fractions, Right Chest Wall Boost/10 Gy/5 Fractions  . COPD (chronic obstructive pulmonary disease)   . Anxiety     has Stage III (T4N3) R Br cancer ; BP (high blood pressure); High cholesterol; Shortness of breath; COPD (chronic obstructive pulmonary disease); and Lymphedema of arm on her problem list.     has No Known Allergies.  Sheila Garrison does not currently have medications on file.  Past Surgical History  Procedure Laterality Date  . Tubal ligation    . Dilation and curettage of uterus    . Breast surgery  2012  . Port a catheter insertion    . Port-a-cath removal Left 01/15/2013    Procedure: REMOVAL PORT-A-CATH;  Surgeon: Scherry Ran, MD;  Location: [facility];  Service: General;  Laterality:  Left;    Denies any headaches, dizziness, double vision, fevers, chills, night sweats, nausea, vomiting, diarrhea, constipation, chest pain, heart palpitations, shortness of breath, blood in stool, black tarry stool, urinary pain, urinary burning, urinary frequency, hematuria.   PHYSICAL EXAMINATION  ECOG PERFORMANCE STATUS: 0 - Asymptomatic  Filed Vitals:   05/13/13 1400  BP: 138/78  Pulse: 97  Temp: 97.8 F (36.6 C)  Resp: 20    GENERAL:alert,  healthy, no distress, well nourished, well developed, comfortable, cooperative and smiling SKIN: skin color, texture, turgor are normal, no rashes or significant lesions HEAD: Normocephalic, No masses, lesions, tenderness or abnormalities EYES: normal, PERRLA, EOMI, Conjunctiva are pink and non-injected EARS: External ears normal OROPHARYNX:mucous membranes are moist  NECK: supple, no adenopathy, thyroid normal size, non-tender, without nodularity, no stridor, non-tender, trachea midline LYMPH:  no palpable lymphadenopathy BREAST:left breast normal without mass, skin or nipple changes or axillary nodes, right post-mastectomy site well healed and free of suspicious changes LUNGS: clear to auscultation and percussion HEART: regular rate & rhythm, no murmurs, no gallops, S1 normal and S2 normal ABDOMEN:abdomen soft, non-tender, obese, normal bowel sounds, no masses or organomegaly and no hepatosplenomegaly BACK: Back symmetric, no curvature., No CVA tenderness EXTREMITIES:less then 2 second capillary refill, no joint deformities, effusion, or inflammation, no edema, no skin discoloration, no clubbing, no cyanosis  NEURO: alert & oriented x 3 with fluent speech, no focal motor/sensory deficits, gait normal    LABORATORY DATA: CBC    Component Value Date/Time   WBC 7.6 01/12/2013 1400   WBC 9.8 12/03/2012 1032   WBC 4.7 11/07/2010 0917   RBC 4.52 01/12/2013 1400   RBC 4.8 12/03/2012 1032   RBC 3.10* 11/07/2010 0917   HGB 13.7 01/12/2013 1400   HGB 14.4 12/03/2012 1032   HGB 10.1* 11/07/2010 0917   HCT 42.7 01/12/2013 1400   HCT 44.5 12/03/2012 1032   HCT 31.1* 11/07/2010 0917   PLT 321 01/12/2013 1400   PLT 194 11/07/2010 0917   MCV 94.5 01/12/2013 1400   MCV 91.3 12/03/2012 1032   MCV 100.3 11/07/2010 0917   MCH 30.3 01/12/2013 1400   MCH 29.4 12/03/2012 1032   MCH 32.6 11/07/2010 0917   MCHC 32.1 01/12/2013 1400   MCHC 32.2 12/03/2012 1032   MCHC 32.5 11/07/2010 0917   RDW 14.4 01/12/2013  1400   RDW 18.3* 11/07/2010 0917   LYMPHSABS 2.3 01/12/2013 1400   LYMPHSABS 1.6 11/07/2010 0917   MONOABS 0.7 01/12/2013 1400   MONOABS 1.4* 11/07/2010 0917   EOSABS 0.3 01/12/2013 1400   EOSABS 0.0 11/07/2010 0917   BASOSABS 0.0 01/12/2013 1400   BASOSABS 0.0 11/07/2010 0917      Chemistry      Component Value Date/Time   NA 141 01/12/2013 1400   NA 140 12/03/2012 0814   K 4.0 01/12/2013 1400   CL 100 01/12/2013 1400   CO2 30 01/12/2013 1400   BUN 11 01/12/2013 1400   BUN 13 12/03/2012 0814   CREATININE 0.55 01/12/2013 1400      Component Value Date/Time   CALCIUM 9.5 01/12/2013 1400   ALKPHOS 105 12/03/2012 0814   AST 23 12/03/2012 0814   ALT 24 12/03/2012 0814   BILITOT 0.2 12/03/2012 0814       RADIOGRAPHIC STUDIES:  12/29/2012  Mammogram was BIRADS 1.  Scanned result from [facility]:  1. Stage III C. (T4, N3) right-sided breast cancer. She had apparent axillary as well as  intramammary lymph node involvement and suspicious ipsilateral subclavicular lymph node involvement. She took 6 cycles of carboplatin and docetaxel with trastuzumab every 21 days followed by surgery. She then had radiation therapy and finished one full year of trastuzumab (finishing on 07/10/2011). She then started on letrozole 2.5 mg on 03/09/2011  2. Severe COPD no longer smoking    Patient Active Problem List   Diagnosis Date Noted  . Lymphedema of arm 10/14/2012  . COPD (chronic obstructive pulmonary disease) 10/09/2011  . Shortness of breath 03/11/2011  . BP (high blood pressure) 09/06/2010  . High cholesterol 09/06/2010  . Stage III (T4N3) R Br cancer  06/21/2010     PLAN:  1. I personally reviewed and went over laboratory results with the patient. 2. I personally reviewed and went over radiographic studies with the patient. 3. Next screening mammogram is due in Nov 2015 4. Review of NCCN guidelines pertaining to surveillance 5. Letrozole compliance encouraged 6. Return in  6 months for follow-up.  If all is well in 6 months, it would be reasonable to see her annually as she is very reliable.   THERAPY PLAN:  She is to continue with Letrozole and we will continue surveillance per NCCN guidelines.  NCCN guidelines recommends the following surveillance for invasive breast cancer:  A. History and Physical exam every 4-6 months for 5 years and then every 12 months.  B. Mammography every 12 months  C. Women on Tamoxifen: annual gynecologic assessment every 12 months if uterus is present.  D. Women on aromatase inhibitor or who experience ovarian failure secondary to treatment should have monitoring of bone health with a bone mineral density determination at baseline and periodically thereafter.  E. Assess and encourage adherence to adjuvant endocrine therapy.  F. Evidence suggests that active lifestyle and achieving and maintaining an ideal body weight (20-25 BMI) may lead to optimal breast cancer outcomes.   All questions were answered. The patient knows to call the clinic with any problems, questions or concerns. We can certainly see the patient much sooner if necessary.  Patient and plan discussed with Dr. Farrel Gobble and he is in agreement with the aforementioned.   [facility] 05/13/2013     Addendum:  Labs show a minimal hypokalemia at 3.6.  She is on 20 mEq BID daily.  I recommend that she increase to TID x 2 weeks.   [facility]

## 2013-05-13 ENCOUNTER — Encounter (HOSPITAL_COMMUNITY): Payer: Self-pay | Admitting: Oncology

## 2013-05-13 ENCOUNTER — Encounter (HOSPITAL_COMMUNITY): Payer: 59 | Attending: Oncology | Admitting: Oncology

## 2013-05-13 ENCOUNTER — Other Ambulatory Visit (HOSPITAL_COMMUNITY): Payer: Self-pay | Admitting: Oncology

## 2013-05-13 VITALS — BP 138/78 | HR 97 | Temp 97.8°F | Resp 20 | Wt 173.4 lb

## 2013-05-13 DIAGNOSIS — G609 Hereditary and idiopathic neuropathy, unspecified: Secondary | ICD-10-CM | POA: Insufficient documentation

## 2013-05-13 DIAGNOSIS — I10 Essential (primary) hypertension: Secondary | ICD-10-CM | POA: Insufficient documentation

## 2013-05-13 DIAGNOSIS — Z9221 Personal history of antineoplastic chemotherapy: Secondary | ICD-10-CM | POA: Insufficient documentation

## 2013-05-13 DIAGNOSIS — Z09 Encounter for follow-up examination after completed treatment for conditions other than malignant neoplasm: Secondary | ICD-10-CM | POA: Insufficient documentation

## 2013-05-13 DIAGNOSIS — Z9010 Acquired absence of unspecified breast and nipple: Secondary | ICD-10-CM | POA: Insufficient documentation

## 2013-05-13 DIAGNOSIS — C50111 Malignant neoplasm of central portion of right female breast: Secondary | ICD-10-CM

## 2013-05-13 DIAGNOSIS — J4489 Other specified chronic obstructive pulmonary disease: Secondary | ICD-10-CM | POA: Insufficient documentation

## 2013-05-13 DIAGNOSIS — Z803 Family history of malignant neoplasm of breast: Secondary | ICD-10-CM | POA: Insufficient documentation

## 2013-05-13 DIAGNOSIS — E785 Hyperlipidemia, unspecified: Secondary | ICD-10-CM | POA: Insufficient documentation

## 2013-05-13 DIAGNOSIS — Z79811 Long term (current) use of aromatase inhibitors: Secondary | ICD-10-CM | POA: Insufficient documentation

## 2013-05-13 DIAGNOSIS — Z8614 Personal history of Methicillin resistant Staphylococcus aureus infection: Secondary | ICD-10-CM | POA: Insufficient documentation

## 2013-05-13 DIAGNOSIS — F411 Generalized anxiety disorder: Secondary | ICD-10-CM | POA: Insufficient documentation

## 2013-05-13 DIAGNOSIS — C50119 Malignant neoplasm of central portion of unspecified female breast: Secondary | ICD-10-CM

## 2013-05-13 DIAGNOSIS — J449 Chronic obstructive pulmonary disease, unspecified: Secondary | ICD-10-CM

## 2013-05-13 DIAGNOSIS — Z923 Personal history of irradiation: Secondary | ICD-10-CM | POA: Insufficient documentation

## 2013-05-13 DIAGNOSIS — C773 Secondary and unspecified malignant neoplasm of axilla and upper limb lymph nodes: Secondary | ICD-10-CM

## 2013-05-13 DIAGNOSIS — Z853 Personal history of malignant neoplasm of breast: Secondary | ICD-10-CM | POA: Insufficient documentation

## 2013-05-13 LAB — CBC WITH DIFFERENTIAL/PLATELET
Basophils Absolute: 0 10*3/uL (ref 0.0–0.1)
Basophils Relative: 0 % (ref 0–1)
EOS PCT: 3 % (ref 0–5)
Eosinophils Absolute: 0.2 10*3/uL (ref 0.0–0.7)
HCT: 43.1 % (ref 36.0–46.0)
Hemoglobin: 13.6 g/dL (ref 12.0–15.0)
LYMPHS PCT: 28 % (ref 12–46)
Lymphs Abs: 2.4 10*3/uL (ref 0.7–4.0)
MCH: 29.4 pg (ref 26.0–34.0)
MCHC: 31.6 g/dL (ref 30.0–36.0)
MCV: 93.3 fL (ref 78.0–100.0)
Monocytes Absolute: 0.7 10*3/uL (ref 0.1–1.0)
Monocytes Relative: 8 % (ref 3–12)
NEUTROS PCT: 61 % (ref 43–77)
Neutro Abs: 5.1 10*3/uL (ref 1.7–7.7)
PLATELETS: 282 10*3/uL (ref 150–400)
RBC: 4.62 MIL/uL (ref 3.87–5.11)
RDW: 15.2 % (ref 11.5–15.5)
WBC: 8.5 10*3/uL (ref 4.0–10.5)

## 2013-05-13 LAB — COMPREHENSIVE METABOLIC PANEL
ALK PHOS: 102 U/L (ref 39–117)
ALT: 16 U/L (ref 0–35)
AST: 20 U/L (ref 0–37)
Albumin: 3.7 g/dL (ref 3.5–5.2)
BUN: 11 mg/dL (ref 6–23)
CALCIUM: 9.5 mg/dL (ref 8.4–10.5)
CO2: 29 meq/L (ref 19–32)
Chloride: 99 mEq/L (ref 96–112)
Creatinine, Ser: 0.55 mg/dL (ref 0.50–1.10)
GFR calc Af Amer: >90 mL/min (ref >90)
Glucose, Bld: 119 mg/dL — ABNORMAL HIGH (ref 70–99)
POTASSIUM: 3.6 meq/L — AB (ref 3.7–5.3)
SODIUM: 141 meq/L (ref 137–147)
Total Bilirubin: <0.2 mg/dL — ABNORMAL LOW (ref 0.3–1.2)
Total Protein: 7.5 g/dL (ref 6.0–8.3)

## 2013-05-13 NOTE — Patient Instructions (Signed)
Sheila G[facility] Discharge Instructions  RECOMMENDATIONS MADE BY THE CONSULTANT AND ANY TEST RESULTS WILL BE SENT TO YOUR REFERRING PHYSICIAN.  EXAM FINDINGS BY THE PHYSICIAN TODAY AND SIGNS OR SYMPTOMS TO REPORT TO CLINIC OR PRIMARY PHYSICIAN: Exam and findings as discussed by Meriel Flavors.  MEDICATIONS PRESCRIBED:  Continue as prescribed.  INSTRUCTIONS/FOLLOW-UP: Return to clinic in 6 months for follow up. Report any issues/concerns to clinic as needed prior to appointment.  Thank you for choosing [facility] to provide your oncology and hematology care.  To afford each patient quality time with our providers, please arrive at least 15 minutes before your scheduled appointment time.  With your help, our goal is to use those 15 minutes to complete the necessary work-up to ensure our physicians have the information they need to help with your evaluation and healthcare recommendations.    Effective January 1st, 2014, we ask that you re-schedule your appointment with our physicians should you arrive 10 or more minutes late for your appointment.  We strive to give you quality time with our providers, and arriving late affects you and other patients whose appointments are after yours.    Again, thank you for choosing [facility].  Our hope is that these requests will decrease the amount of time that you wait before being seen by our physicians.       _____________________________________________________________  Should you have questions after your visit to [facility], please contact our office at [phone] between the hours of 8:30 a.m. and 5:00 p.m.  Voicemails left after 4:30 p.m. will not be returned until the following business day.  For prescription refill requests, have your pharmacy contact our office with your prescription refill request.

## 2013-07-31 ENCOUNTER — Ambulatory Visit (INDEPENDENT_AMBULATORY_CARE_PROVIDER_SITE_OTHER): Payer: 59 | Admitting: Physician Assistant

## 2013-07-31 ENCOUNTER — Encounter: Payer: Self-pay | Admitting: Physician Assistant

## 2013-07-31 VITALS — BP 110/63 | HR 83 | Temp 97.4°F | Ht 64.0 in | Wt 168.2 lb

## 2013-07-31 DIAGNOSIS — J069 Acute upper respiratory infection, unspecified: Secondary | ICD-10-CM

## 2013-07-31 MED ORDER — AMOXICILLIN-POT CLAVULANATE 875-125 MG PO TABS: 1.0000 | ORAL_TABLET | Freq: Two times a day (BID) | ORAL | Status: DC

## 2013-07-31 NOTE — Patient Instructions (Signed)
Upper Respiratory Infection, Adult An upper respiratory infection (URI) is also known as the common cold. It is often caused by a type of germ (virus). Colds are easily spread (contagious). You can pass it to others by kissing, coughing, sneezing, or drinking out of the same glass. Usually, you get better in 1 or 2 weeks.  HOME CARE   Only take medicine as told by your doctor.  Use a warm mist humidifier or breathe in steam from a hot shower.  Drink enough water and fluids to keep your pee (urine) clear or pale yellow.  Get plenty of rest.  Return to work when your temperature is back to normal or as told by your doctor. You may use a face mask and wash your hands to stop your cold from spreading. GET HELP RIGHT AWAY IF:   After the first few days, you feel you are getting worse.  You have questions about your medicine.  You have chills, shortness of breath, or brown or red spit (mucus).  You have yellow or brown snot (nasal discharge) or pain in the face, especially when you bend forward.  You have a fever, puffy (swollen) neck, pain when you swallow, or white spots in the back of your throat.  You have a bad headache, ear pain, sinus pain, or chest pain.  You have a high-pitched whistling sound when you breathe in and out (wheezing).  You have a lasting cough or cough up blood.  You have sore muscles or a stiff neck. MAKE SURE YOU:   Understand these instructions.  Will watch your condition.  Will get help right away if you are not doing well or get worse. Document Released: 07/11/2007 Document Revised: 04/16/2011 Document Reviewed: 05/29/2010 ExitCare Patient Information 2015 [employer]. This information is not intended to replace advice given to you by your health care provider. Make sure you discuss any questions you have with your health care provider.  

## 2013-07-31 NOTE — Progress Notes (Signed)
Subjective:     Patient ID: Sheila Garrison, female   DOB: Dec 29, 1955, 58 y.o.   MRN: [id number]  HPI Pt with cough, congestion, and PND  Review of Systems  Constitutional: Positive for fever and fatigue. Negative for activity change and appetite change.  HENT: Positive for congestion, postnasal drip, rhinorrhea, sinus pressure and sore throat.   Respiratory: Positive for cough. Negative for apnea, choking, chest tightness and wheezing.   Cardiovascular: Negative.        Objective:   Physical Exam  Nursing note and vitals reviewed. Constitutional: She appears well-developed and well-nourished.  HENT:  Right Ear: External ear normal.  Left Ear: External ear normal.  Mouth/Throat: Oropharynx is clear and moist.  Neck: Neck supple. No JVD present.  Cardiovascular: Normal rate, regular rhythm and normal heart sounds.   Pulmonary/Chest: Effort normal. No respiratory distress. She has wheezes. She has no rales.  Lymphadenopathy:    She has no cervical adenopathy.       Assessment:     Acute URI    Plan:     Fluids Rest Continue with cough med Augmentin rx F/U prn

## 2013-08-17 ENCOUNTER — Telehealth: Payer: Self-pay | Admitting: Nurse Practitioner

## 2013-08-17 MED ORDER — ACLIDINIUM BROMIDE 400 MCG/ACT IN AEPB: 1.0000 | INHALATION_SPRAY | RESPIRATORY_TRACT | Status: DC | PRN

## 2013-08-17 MED ORDER — LORAZEPAM 0.5 MG PO TABS: 0.5000 mg | ORAL_TABLET | Freq: Three times a day (TID) | ORAL | Status: DC | PRN

## 2013-08-17 NOTE — Telephone Encounter (Signed)
tudorza  sent the Rx to the pharmacy. Please call in lorazepam with 0 refills

## 2013-08-17 NOTE — Telephone Encounter (Signed)
Refill called to [facility] 

## 2013-08-19 MED ORDER — ACLIDINIUM BROMIDE 400 MCG/ACT IN AEPB: 1.0000 | INHALATION_SPRAY | RESPIRATORY_TRACT | Status: DC | PRN

## 2013-08-19 NOTE — Addendum Note (Signed)
Addended by: Ilean China on: 08/19/2013 03:38 PM   Modules accepted: Orders

## 2013-09-02 ENCOUNTER — Other Ambulatory Visit: Payer: Self-pay | Admitting: Pharmacist

## 2013-09-02 DIAGNOSIS — Z79899 Other long term (current) drug therapy: Secondary | ICD-10-CM

## 2013-09-04 ENCOUNTER — Telehealth: Payer: Self-pay | Admitting: Nurse Practitioner

## 2013-09-04 NOTE — Telephone Encounter (Signed)
No Crestor. Patient aware.

## 2013-09-18 ENCOUNTER — Ambulatory Visit: Payer: 59 | Admitting: Radiation Oncology

## 2013-09-30 ENCOUNTER — Telehealth: Payer: Self-pay | Admitting: Nurse Practitioner

## 2013-09-30 NOTE — Telephone Encounter (Signed)
Samples given.  

## 2013-10-01 ENCOUNTER — Other Ambulatory Visit: Payer: Self-pay | Admitting: *Deleted

## 2013-10-01 DIAGNOSIS — I10 Essential (primary) hypertension: Secondary | ICD-10-CM

## 2013-10-01 MED ORDER — LOSARTAN POTASSIUM-HCTZ 100-12.5 MG PO TABS: 1.0000 | ORAL_TABLET | Freq: Every day | ORAL | Status: DC

## 2013-10-30 ENCOUNTER — Ambulatory Visit
Admission: RE | Admit: 2013-10-30 | Discharge: 2013-10-30 | Disposition: A | Discharge status: Home / Self Care | Payer: 59 | Source: Ambulatory Visit | Attending: Radiation Oncology | Admitting: Radiation Oncology

## 2013-10-30 ENCOUNTER — Telehealth: Payer: Self-pay | Admitting: *Deleted

## 2013-10-30 ENCOUNTER — Telehealth: Payer: Self-pay | Admitting: Radiation Oncology

## 2013-10-30 ENCOUNTER — Encounter: Payer: Self-pay | Admitting: Radiation Oncology

## 2013-10-30 VITALS — BP 128/66 | HR 85 | Temp 98.1°F | Resp 12 | Wt 174.2 lb

## 2013-10-30 DIAGNOSIS — C50111 Malignant neoplasm of central portion of right female breast: Secondary | ICD-10-CM

## 2013-10-30 NOTE — Progress Notes (Signed)
She is currently in no pain. Pt complains of, Fatigue.  Pt right breast skin is warm dry and intact. Noted mild edema over right forearm, pt reports she wears a sleeve and does prescribed exercises as needed. The patient eats a regular, healthy diet.Marland Kitchen

## 2013-10-30 NOTE — Progress Notes (Signed)
Radiation Oncology         [phone] ________________________________  Name: Sheila Garrison MRN: [id number]  Date: 10/30/2013  DOB: Apr 23, 1955  Follow-Up Visit Note  Outpatient  CC: [name], MD  Chipper Herb, MD  Diagnosis and Prior Radiotherapy:  Clinical [phone], ypT2N2a triple positive Right UOQ breast cancer   Received post-mastectomy RT to the right Chest wall, SCV, PAB regions through 02/22/11 in [place], [place] under my care  Total dose - 50Gy/25 to chest wall and nodes, with scar boost over CW of 10Gy/5.   Narrative:  The patient returns today for routine follow-up. She is currently in no pain. Pt complains of fatigue.    She has edema of right forearm, pt reports she wears a sleeve occasionally and does prescribed exercises sometimes.                       On Letrozole.  Left mammogram in Nov 2014 was Birads 1.   ALLERGIES:  has No Known Allergies.  Meds: Current Outpatient Prescriptions  Medication Sig Dispense Refill  . Aclidinium Bromide (TUDORZA PRESSAIR) 400 MCG/ACT AEPB Inhale 1 puff into the lungs as needed. Shortness of breath  1 each  5  . amoxicillin-clavulanate (AUGMENTIN) 875-125 MG per tablet Take 1 tablet by mouth 2 (two) times daily.  20 tablet  0  . Cholecalciferol (VITAMIN D-3 PO) Take 1 capsule by mouth daily.       . clobetasol cream (TEMOVATE) 9.38 % Apply 1 application topically as needed. rash      . fexofenadine (ALLEGRA) 180 MG tablet Take 180 mg by mouth daily.        Marland Kitchen ketoconazole (NIZORAL) 2 % cream Apply 1 application topically as needed. rash      . letrozole (FEMARA) 2.5 MG tablet Take 1 tablet (2.5 mg total) by mouth daily.  90 tablet  3  . LORazepam (ATIVAN) 0.5 MG tablet Take 0.5 mg by mouth every 8 (eight) hours as needed. anxiety      . LORazepam (ATIVAN) 0.5 MG tablet Take 1 tablet (0.5 mg total) by mouth every 8 (eight) hours as needed.  90 tablet  0  . losartan-hydrochlorothiazide (HYZAAR) 100-12.5 MG per tablet Take 1  tablet by mouth daily.  90 tablet  1  . Multiple Vitamin (MULTIVITAMIN) capsule Take 1 capsule by mouth daily.        . naproxen sodium (ANAPROX) 220 MG tablet Take 220 mg by mouth as needed. pain      . omeprazole (PRILOSEC) 40 MG capsule Take 1 capsule (40 mg total) by mouth daily.  90 capsule  3  . potassium chloride SA (K-DUR,KLOR-CON) 20 MEQ tablet Take 1 tablet (20 mEq total) by mouth 2 (two) times daily.  180 tablet  3  . rosuvastatin (CRESTOR) 10 MG tablet Take 1 tablet (10 mg total) by mouth daily.  90 tablet  1   No current facility-administered medications for this encounter.    Physical Findings: The patient is in no acute distress. Patient is alert and oriented.  weight is 174 lb 3.2 oz (79.017 kg). Her oral temperature is 98.1 F (36.7 C). Her blood pressure is 128/66 and her pulse is 85. Her respiration is 12 and oxygen saturation is 97%. .   Right chest wall - no skin changes of concern, no palpable nodules.  Left breast unremarkable.  No axillary adenopathy bilaterally.  No neck adenopathy.  +Right arm edema.   Lab Findings:  Lab Results  Component Value Date   WBC 8.5 05/13/2013   HGB 13.6 05/13/2013   HCT 43.1 05/13/2013   MCV 93.3 05/13/2013   PLT 282 05/13/2013    Radiographic Findings: See above  Impression/Plan:  Doing well with no evidence of disease on exam. F/u in 1 year in [place].  Sooner, if needed.  Encouraged calling PT, doing massage, and using compression device more often for right arm.  She agrees she could improve compliance with this.  I spent 15 minutes face to face with the patient and more than 50% of that time was spent in counseling and/or coordination of care. _____________________________________   Eppie Gibson, MD

## 2013-10-30 NOTE — Telephone Encounter (Signed)
CALLED PATIENT TO INFORM OF FU FOR 11/02/14- ARRIVAL TIME - 9:25 AM - IN THE [facility] FOR DR. SQUIRE , LVM FOR A RETURN CALL

## 2013-10-30 NOTE — Telephone Encounter (Signed)
Returned message left by [employer]. Patient understands she has a follow up in September 2016 with Dr. Isidore Moos in [place].

## 2013-11-10 NOTE — Progress Notes (Signed)
Sheila G[facility], [place] [address]  Cancer of central portion of female breast, right - Plan: CBC with Differential, Comprehensive metabolic panel, CBC with Differential, Comprehensive metabolic panel  Lymphedema of arm  CURRENT THERAPY: On Letrozole beginning on 03/09/2011  INTERVAL HISTORY: Sheila [facility] 58 y.o. female returns for  regular  visit for followup of stage III C. (T4, N3) right-sided breast cancer. She had apparent axillary as well as intramammary lymph node involvement and suspicious ipsilateral subclavicular lymph node involvement. She took 6 cycles of carboplatin and docetaxel with trastuzumab every 21 days followed by surgery. She then had radiation therapy and finished one full year of trastuzumab (finishing on 07/10/2011). She then started on letrozole 2.5 mg on 03/09/2011 and she will take that for 5 years, or greater.   I personally reviewed and went over laboratory results with the patient.  The results are noted within this dictation.  Labs were done today to update her chart.  She is due for her mammogram next month (Nov 2015).  This is scheduled. She is also due for a bone density exam which was last done in May 2013.  She is on AI therapy and that is known to increase one's risk for osteoporosis.  She reports that the bone density is ordered and only needs scheduled.  She is going to have this performed by [facility].    Otherwise, oncologically, she denies any complaints and ROS questioning is negative.   Past Medical History  Diagnosis Date  . Hypertension   . Nipple discharge     with pain, infection, lump  . MRSA (methicillin resistant staph aureus) culture positive     in breast  . Wears dentures   . Breast CA 07/01/2010    rt breast ca  . Family history of breast cancer     Aunt on father's side  . Neuropathy     bilateral toes  . Hyperlipemia   . Status post chemotherapy     6 cycles of carboplatin and docetaxel  with trastuzumab every 21 days followed by surgery  . Use of letrozole (Femara) 03/09/2011  . S/P radiation therapy 01/08/11 - 02/22/11    Right Chest Wall/ 50 Gy / 25 Fractions, Right [place]/ 50 gy/25 Fractions, Right Axillary Boost/500 cGy/25 Fractions, Right Chest Wall Boost/10 Gy/5 Fractions  . COPD (chronic obstructive pulmonary disease)   . Anxiety     has Stage III (T4N3) R Br cancer ; BP (high blood pressure); High cholesterol; Shortness of breath; COPD (chronic obstructive pulmonary disease); and Lymphedema of arm on her problem list.     has No Known Allergies.  [name] does not currently have medications on file.  Past Surgical History  Procedure Laterality Date  . Tubal ligation    . Dilation and curettage of uterus    . Breast surgery  2012  . Port a catheter insertion    . Port-a-cath removal Left 01/15/2013    Procedure: REMOVAL PORT-A-CATH;  Surgeon: Scherry Ran, MD;  Location: [facility];  Service: General;  Laterality: Left;    Denies any headaches, dizziness, double vision, fevers, chills, night sweats, nausea, vomiting, diarrhea, constipation, chest pain, heart palpitations, shortness of breath, blood in stool, black tarry stool, urinary pain, urinary burning, urinary frequency, hematuria.   PHYSICAL EXAMINATION  ECOG PERFORMANCE STATUS: 0 - Asymptomatic  Filed Vitals:   11/13/13 1421  BP: 151/64  Pulse: 95  Temp: 97.8 F (36.6  C)  Resp: 20    GENERAL:alert, no distress, well nourished, well developed, comfortable, cooperative and smiling SKIN: skin color, texture, turgor are normal, no rashes or significant lesions HEAD: Normocephalic, No masses, lesions, tenderness or abnormalities EYES: normal, PERRLA, EOMI, Conjunctiva are pink and non-injected EARS: External ears normal OROPHARYNX:mucous membranes are moist  NECK: supple, no adenopathy, thyroid normal size, non-tender, without nodularity, no stridor, non-tender, trachea midline LYMPH:  no  palpable lymphadenopathy, no hepatosplenomegaly BREAST:not examined LUNGS: clear to auscultation and percussion HEART: regular rate & rhythm, no murmurs, no gallops, S1 normal and S2 normal ABDOMEN:abdomen soft, non-tender, obese, normal bowel sounds, no masses or organomegaly and no hepatosplenomegaly BACK: Back symmetric, no curvature., No CVA tenderness EXTREMITIES:less then 2 second capillary refill, no joint deformities, effusion, or inflammation, no edema, no skin discoloration, no clubbing, no cyanosis  NEURO: alert & oriented x 3 with fluent speech, no focal motor/sensory deficits, gait normal   LABORATORY DATA: CBC    Component Value Date/Time   WBC 8.5 05/13/2013 1415   WBC 9.8 12/03/2012 1032   WBC 4.7 11/07/2010 0917   RBC 4.62 05/13/2013 1415   RBC 4.8 12/03/2012 1032   RBC 3.10* 11/07/2010 0917   HGB 13.6 05/13/2013 1415   HGB 14.4 12/03/2012 1032   HGB 10.1* 11/07/2010 0917   HCT 43.1 05/13/2013 1415   HCT 44.5 12/03/2012 1032   HCT 31.1* 11/07/2010 0917   PLT 282 05/13/2013 1415   PLT 194 11/07/2010 0917   MCV 93.3 05/13/2013 1415   MCV 91.3 12/03/2012 1032   MCV 100.3 11/07/2010 0917   MCH 29.4 05/13/2013 1415   MCH 29.4 12/03/2012 1032   MCH 32.6 11/07/2010 0917   MCHC 31.6 05/13/2013 1415   MCHC 32.2 12/03/2012 1032   MCHC 32.5 11/07/2010 0917   RDW 15.2 05/13/2013 1415   RDW 18.3* 11/07/2010 0917   LYMPHSABS 2.4 05/13/2013 1415   LYMPHSABS 1.6 11/07/2010 0917   MONOABS 0.7 05/13/2013 1415   MONOABS 1.4* 11/07/2010 0917   EOSABS 0.2 05/13/2013 1415   EOSABS 0.0 11/07/2010 0917   BASOSABS 0.0 05/13/2013 1415   BASOSABS 0.0 11/07/2010 0917      Chemistry      Component Value Date/Time   NA 141 05/13/2013 1415   NA 140 12/03/2012 0814   K 3.6* 05/13/2013 1415   CL 99 05/13/2013 1415   CO2 29 05/13/2013 1415   BUN 11 05/13/2013 1415   BUN 13 12/03/2012 0814   CREATININE 0.55 05/13/2013 1415      Component Value Date/Time   CALCIUM 9.5 05/13/2013 1415   ALKPHOS 102 05/13/2013 1415   AST 20  05/13/2013 1415   ALT 16 05/13/2013 1415   BILITOT <0.2* 05/13/2013 1415        ASSESSMENT:  1. Stage III C. (T4, N3) right-sided breast cancer. She had apparent axillary as well as intramammary lymph node involvement and suspicious ipsilateral subclavicular lymph node involvement. She took 6 cycles of carboplatin and docetaxel with trastuzumab every 21 days followed by surgery. She then had radiation therapy and finished one full year of trastuzumab (finishing on 07/10/2011). She then started on letrozole 2.5 mg on 03/09/2011  2. Severe COPD no longer smoking   Patient Active Problem List   Diagnosis Date Noted  . Lymphedema of arm 10/14/2012  . COPD (chronic obstructive pulmonary disease) 10/09/2011  . Shortness of breath 03/11/2011  . BP (high blood pressure) 09/06/2010  . High cholesterol 09/06/2010  . Stage III (  T4N3) R Br cancer  06/21/2010     PLAN:  1. I personally reviewed and went over laboratory results with the patient.  2. I personally reviewed and went over radiographic studies with the patient.  3. Next screening mammogram is due in Nov 2015  4. Review of NCCN guidelines pertaining to surveillance  5. Letrozole compliance encouraged  6. Labs today: CBC diff, CMET 7. Bone density exam is ordered, requiring scheduled at [facility]. 8. Return in 6 months for follow-up.    THERAPY PLAN:  She is to continue with Letrozole and we will continue surveillance per NCCN guidelines.  NCCN guidelines recommends the following surveillance for invasive breast cancer:  A. History and Physical exam every 4-6 months for 5 years and then every 12 months.  B. Mammography every 12 months  C. Women on Tamoxifen: annual gynecologic assessment every 12 months if uterus is present.  D. Women on aromatase inhibitor or who experience ovarian failure secondary to treatment should have monitoring of bone health with a bone mineral density determination at baseline and periodically  thereafter.  E. Assess and encourage adherence to adjuvant endocrine therapy.  F. Evidence suggests that active lifestyle and achieving and maintaining an ideal body weight (20-25 BMI) may lead to optimal breast cancer outcomes.   All questions were answered. The patient knows to call the clinic with any problems, questions or concerns. We can certainly see the patient much sooner if necessary.  Patient and plan discussed with Dr. Nelida Meuse and he is in agreement with the aforementioned.   KEFALAS,THOMAS 11/13/2013

## 2013-11-13 ENCOUNTER — Encounter (HOSPITAL_BASED_OUTPATIENT_CLINIC_OR_DEPARTMENT_OTHER): Payer: 59

## 2013-11-13 ENCOUNTER — Encounter (HOSPITAL_COMMUNITY): Payer: Self-pay | Admitting: Oncology

## 2013-11-13 ENCOUNTER — Encounter (HOSPITAL_COMMUNITY): Payer: 59 | Attending: Oncology | Admitting: Oncology

## 2013-11-13 VITALS — BP 151/64 | HR 95 | Temp 97.8°F | Resp 20 | Wt 173.0 lb

## 2013-11-13 DIAGNOSIS — J449 Chronic obstructive pulmonary disease, unspecified: Secondary | ICD-10-CM | POA: Diagnosis not present

## 2013-11-13 DIAGNOSIS — I890 Lymphedema, not elsewhere classified: Secondary | ICD-10-CM | POA: Diagnosis not present

## 2013-11-13 DIAGNOSIS — Z87891 Personal history of nicotine dependence: Secondary | ICD-10-CM | POA: Insufficient documentation

## 2013-11-13 DIAGNOSIS — C50111 Malignant neoplasm of central portion of right female breast: Secondary | ICD-10-CM | POA: Diagnosis present

## 2013-11-13 DIAGNOSIS — C773 Secondary and unspecified malignant neoplasm of axilla and upper limb lymph nodes: Secondary | ICD-10-CM

## 2013-11-13 LAB — CBC WITH DIFFERENTIAL/PLATELET
Basophils Absolute: 0 10*3/uL (ref 0.0–0.1)
Basophils Relative: 0 % (ref 0–1)
EOS ABS: 0.4 10*3/uL (ref 0.0–0.7)
Eosinophils Relative: 4 % (ref 0–5)
HEMATOCRIT: 45.4 % (ref 36.0–46.0)
HEMOGLOBIN: 15 g/dL (ref 12.0–15.0)
Lymphocytes Relative: 24 % (ref 12–46)
Lymphs Abs: 2.1 10*3/uL (ref 0.7–4.0)
MCH: 30.4 pg (ref 26.0–34.0)
MCHC: 33 g/dL (ref 30.0–36.0)
MCV: 92.1 fL (ref 78.0–100.0)
MONO ABS: 0.5 10*3/uL (ref 0.1–1.0)
MONOS PCT: 6 % (ref 3–12)
NEUTROS PCT: 66 % (ref 43–77)
Neutro Abs: 5.7 10*3/uL (ref 1.7–7.7)
Platelets: 293 10*3/uL (ref 150–400)
RBC: 4.93 MIL/uL (ref 3.87–5.11)
RDW: 14.9 % (ref 11.5–15.5)
WBC: 8.7 10*3/uL (ref 4.0–10.5)

## 2013-11-13 LAB — COMPREHENSIVE METABOLIC PANEL
ALT: 24 U/L (ref 0–35)
ANION GAP: 13 (ref 5–15)
AST: 23 U/L (ref 0–37)
Albumin: 3.5 g/dL (ref 3.5–5.2)
Alkaline Phosphatase: 114 U/L (ref 39–117)
BUN: 12 mg/dL (ref 6–23)
CO2: 29 mEq/L (ref 19–32)
CREATININE: 0.61 mg/dL (ref 0.50–1.10)
Calcium: 9.4 mg/dL (ref 8.4–10.5)
Chloride: 99 mEq/L (ref 96–112)
GFR calc Af Amer: >90 mL/min (ref >90)
GFR calc non Af Amer: >90 mL/min (ref >90)
Glucose, Bld: 132 mg/dL — ABNORMAL HIGH (ref 70–99)
Potassium: 3.7 mEq/L (ref 3.7–5.3)
Sodium: 141 mEq/L (ref 137–147)
TOTAL PROTEIN: 7.3 g/dL (ref 6.0–8.3)
Total Bilirubin: <0.2 mg/dL — ABNORMAL LOW (ref 0.3–1.2)

## 2013-11-13 NOTE — Progress Notes (Signed)
Sheila G[facility] presented for labwork. Labs per MD order drawn via Peripheral Line 23 gauge needle inserted in left antecubital  Good blood return present. Procedure without incident.  Needle removed intact. Patient tolerated procedure well.

## 2013-11-13 NOTE — Patient Instructions (Addendum)
[facility] Discharge Instructions  RECOMMENDATIONS MADE BY THE CONSULTANT AND ANY TEST RESULTS WILL BE SENT TO YOUR REFERRING PHYSICIAN.  Follow up in 6 months with office visit.  Thank you for choosing [facility] to provide your oncology and hematology care.  To afford each patient quality time with our providers, please arrive at least 15 minutes before your scheduled appointment time.  With your help, our goal is to use those 15 minutes to complete the necessary work-up to ensure our physicians have the information they need to help with your evaluation and healthcare recommendations.    Effective January 1st, 2014, we ask that you re-schedule your appointment with our physicians should you arrive 10 or more minutes late for your appointment.  We strive to give you quality time with our providers, and arriving late affects you and other patients whose appointments are after yours.    Again, thank you for choosing [facility].  Our hope is that these requests will decrease the amount of time that you wait before being seen by our physicians.       _____________________________________________________________  Should you have questions after your visit to [facility], please contact our office at [phone] between the hours of 8:30 a.m. and 4:30 p.m.  Voicemails left after 4:30 p.m. will not be returned until the following business day.  For prescription refill requests, have your pharmacy contact our office with your prescription refill request.    _______________________________________________________________  We hope that we have given you very good care.  You may receive a patient satisfaction survey in the mail, please complete it and return it as soon as possible.  We value your feedback!  _______________________________________________________________  Have you asked about our STAR program?  STAR stands for  Survivorship Training and Rehabilitation, and this is a nationally recognized cancer care program that focuses on survivorship and rehabilitation.  Cancer and cancer treatments may cause problems, such as, pain, making you feel tired and keeping you from doing the things that you need or want to do. Cancer rehabilitation can help. Our goal is to reduce these troubling effects and help you have the best quality of life possible.  You may receive a survey from a nurse that asks questions about your current state of health.  Based on the survey results, all eligible patients will be referred to the [facility] program for an evaluation so we can better serve you!  A frequently asked questions sheet is available upon request.

## 2013-11-18 ENCOUNTER — Ambulatory Visit (INDEPENDENT_AMBULATORY_CARE_PROVIDER_SITE_OTHER): Payer: 59 | Admitting: Pharmacist

## 2013-11-18 ENCOUNTER — Ambulatory Visit (INDEPENDENT_AMBULATORY_CARE_PROVIDER_SITE_OTHER): Payer: 59

## 2013-11-18 DIAGNOSIS — Z79899 Other long term (current) drug therapy: Secondary | ICD-10-CM

## 2013-11-18 DIAGNOSIS — M8580 Other specified disorders of bone density and structure, unspecified site: Secondary | ICD-10-CM

## 2013-11-18 NOTE — Patient Instructions (Signed)

## 2013-11-18 NOTE — Progress Notes (Signed)
Patient ID: Sheila Garrison, female   DOB: February 08, 1955, 58 y.o.   MRN: [id number] [facility]   HPI: Does pt already have a diagnosis of:  Osteopenia?  Yes Osteoporosis?  No  Back Pain?  No       Kyphosis?  No Prior fracture?  No Med(s) for Osteoporosis/Osteopenia:  none Med(s) previously tried for Osteoporosis/Osteopenia:  none                                                             PMH: Age at menopause:  58 yo Hysterectomy?  No Oophorectomy?  No HRT? No Steroid Use?  No Thyroid med?  No History of cancer?  Yes - breast cancer History of digestive disorders (ie Crohn's)?  Yes- GERD Current or previous eating disorders?  No Last Vitamin D Result:  49 (10/22/2011) Last GFR Result:  Over 90 (11/13/2013)   FH/SH: Family history of osteoporosis?  No Parent with history of hip fracture?  No Family history of breast cancer?  Yes - paternal aunt Exercise?  No Smoking?  No Alcohol?  No      DEXA Results Date of Test T-Score for [facility] Spine L1-L4 T-Score for Total Left Hip T-Score for Total Right Hip  11/18/2013 -1.8 -0.4 -0.2  06/06/2011 -1.4 -0.3 0.1             FRAX 10 year estimate: Total FX risk:  8.7%  (consider medication if >/= 20%) Hip FX risk:  0.9%  (consider medication if >/= 3%)  Assessment: Osteopenia with low fracture estimate per FRAX  Recommendations: 1.  Discussed changes in BMD and fracture risk 2.  recommend calcium 1200mg  daily through supplementation or diet.  3.  recommend weight bearing exercise - 30 minutes at least 4 days per week.   4.  Counseled and educated about fall risk and prevention.  Recheck DEXA:  2 years  Time spent counseling patient:  15 minutes   Cherre Robins, PharmD, CPP

## 2014-01-23 ENCOUNTER — Other Ambulatory Visit: Payer: Self-pay | Admitting: Nurse Practitioner

## 2014-04-13 ENCOUNTER — Telehealth: Payer: Self-pay | Admitting: *Deleted

## 2014-04-13 DIAGNOSIS — I10 Essential (primary) hypertension: Secondary | ICD-10-CM

## 2014-04-13 MED ORDER — LOSARTAN POTASSIUM-HCTZ 100-12.5 MG PO TABS: 1.0000 | ORAL_TABLET | Freq: Every day | ORAL | Status: DC

## 2014-04-13 NOTE — Telephone Encounter (Signed)
done

## 2014-04-16 ENCOUNTER — Encounter: Payer: Self-pay | Admitting: Family

## 2014-04-16 ENCOUNTER — Ambulatory Visit (INDEPENDENT_AMBULATORY_CARE_PROVIDER_SITE_OTHER): Payer: 59 | Admitting: Family

## 2014-04-16 VITALS — BP 158/79 | HR 101 | Temp 97.9°F | Ht 64.0 in | Wt 169.8 lb

## 2014-04-16 DIAGNOSIS — M8580 Other specified disorders of bone density and structure, unspecified site: Secondary | ICD-10-CM

## 2014-04-16 DIAGNOSIS — K219 Gastro-esophageal reflux disease without esophagitis: Secondary | ICD-10-CM

## 2014-04-16 DIAGNOSIS — E876 Hypokalemia: Secondary | ICD-10-CM

## 2014-04-16 DIAGNOSIS — I10 Essential (primary) hypertension: Secondary | ICD-10-CM | POA: Diagnosis not present

## 2014-04-16 DIAGNOSIS — E559 Vitamin D deficiency, unspecified: Secondary | ICD-10-CM

## 2014-04-16 DIAGNOSIS — E785 Hyperlipidemia, unspecified: Secondary | ICD-10-CM

## 2014-04-16 DIAGNOSIS — J449 Chronic obstructive pulmonary disease, unspecified: Secondary | ICD-10-CM | POA: Diagnosis not present

## 2014-04-16 DIAGNOSIS — F411 Generalized anxiety disorder: Secondary | ICD-10-CM | POA: Diagnosis not present

## 2014-04-16 MED ORDER — POTASSIUM CHLORIDE CRYS ER 20 MEQ PO TBCR: 20.0000 meq | EXTENDED_RELEASE_TABLET | Freq: Two times a day (BID) | ORAL | Status: DC

## 2014-04-16 MED ORDER — OMEPRAZOLE 40 MG PO CPDR: 40.0000 mg | DELAYED_RELEASE_CAPSULE | Freq: Every day | ORAL | Status: DC

## 2014-04-16 MED ORDER — ACLIDINIUM BROMIDE 400 MCG/ACT IN AEPB: 1.0000 | INHALATION_SPRAY | RESPIRATORY_TRACT | Status: DC | PRN

## 2014-04-16 MED ORDER — LORAZEPAM 0.5 MG PO TABS: 0.5000 mg | ORAL_TABLET | Freq: Three times a day (TID) | ORAL | Status: DC | PRN

## 2014-04-16 MED ORDER — LOSARTAN POTASSIUM-HCTZ 100-12.5 MG PO TABS: 1.0000 | ORAL_TABLET | Freq: Every day | ORAL | Status: DC

## 2014-04-16 NOTE — Patient Instructions (Signed)

## 2014-04-16 NOTE — Progress Notes (Signed)
Subjective:    Patient ID: Sheila Garrison, female    DOB: 05-Sep-1955, 59 y.o.   MRN: [id number]  Hypertension This is a chronic problem. The current episode started more than 1 year ago. The problem has been waxing and waning since onset. The problem is uncontrolled. Associated symptoms include anxiety. Pertinent negatives include no headaches, palpitations, peripheral edema or shortness of breath. Risk factors for coronary artery disease include dyslipidemia, post-menopausal state, smoking/tobacco exposure, stress and family history. Past treatments include angiotensin blockers and diuretics. The current treatment provides moderate improvement. Compliance problems include diet.  There is no history of kidney disease, CAD/MI, CVA, heart failure or a thyroid problem. There is no history of sleep apnea.  Hyperlipidemia This is a chronic problem. The current episode started more than 1 year ago. The problem is controlled. Recent lipid tests were reviewed and are normal. She has no history of diabetes or hypothyroidism. Factors aggravating her hyperlipidemia include smoking and fatty foods. Pertinent negatives include no leg pain, myalgias or shortness of breath. Current antihyperlipidemic treatment includes statins. The current treatment provides moderate improvement of lipids. Risk factors for coronary artery disease include dyslipidemia, family history, hypertension and post-menopausal.  Anxiety Presents for follow-up visit. Symptoms include excessive worry and nervous/anxious behavior. Patient reports no decreased concentration, depressed mood, insomnia, palpitations, panic or shortness of breath. Symptoms occur rarely. Nighttime awakenings: none.   Her past medical history is significant for anxiety/panic attacks. There is no history of depression. Past treatments include benzodiazephines.  Gastrophageal Reflux She reports no belching, no heartburn, no sore throat or no water brash. This is a  chronic problem. The current episode started more than 1 year ago. The problem occurs rarely. The symptoms are aggravated by certain foods. She has tried a PPI for the symptoms. The treatment provided significant relief.  COPD     Review of Systems  Constitutional: Negative.   HENT: Negative.  Negative for sore throat.   Eyes: Negative.   Respiratory: Negative.  Negative for shortness of breath.   Cardiovascular: Negative.  Negative for palpitations.  Gastrointestinal: Negative.  Negative for heartburn.  Endocrine: Negative.   Genitourinary: Negative.   Musculoskeletal: Negative.  Negative for myalgias.  Neurological: Negative.  Negative for headaches.  Hematological: Negative.   Psychiatric/Behavioral: Negative for decreased concentration. The patient is nervous/anxious. The patient does not have insomnia.   All other systems reviewed and are negative.      Objective:   Physical Exam  Constitutional: She is oriented to person, place, and time. She appears well-developed and well-nourished. No distress.  HENT:  Head: Normocephalic and atraumatic.  Right Ear: External ear normal.  Left Ear: External ear normal.  Nose: Nose normal.  Mouth/Throat: Oropharynx is clear and moist.  Eyes: Pupils are equal, round, and reactive to light.  Neck: Normal range of motion. Neck supple. No thyromegaly present.  Cardiovascular: Normal rate, regular rhythm, normal heart sounds and intact distal pulses.   No murmur heard. Pulmonary/Chest: Effort normal and breath sounds normal. No respiratory distress. She has no wheezes.  Abdominal: Soft. Bowel sounds are normal. She exhibits no distension. There is no tenderness.  Musculoskeletal: Normal range of motion. She exhibits no edema or tenderness.  Neurological: She is alert and oriented to person, place, and time. She has normal reflexes. No cranial nerve deficit.  Skin: Skin is warm and dry.  Psychiatric: She has a normal mood and affect. Her  behavior is normal. Judgment and thought content normal.  Vitals  reviewed.   BP 158/79 mmHg  Pulse 101  Temp(Src) 97.9 F (36.6 C) (Oral)  Ht 5' 4"  (1.626 m)  Wt 169 lb 12.8 oz (77.021 kg)  BMI 29.13 kg/m2       Assessment & Plan:  1. Hyperlipidemia - CMP14+EGFR - Lipid panel  2. Osteopenia - CMP14+EGFR  3. Vitamin D deficiency - CMP14+EGFR - Vit D  25 hydroxy (rtn osteoporosis monitoring)  4. GAD (generalized anxiety disorder) - CMP14+EGFR - LORazepam (ATIVAN) 0.5 MG tablet; Take 1 tablet (0.5 mg total) by mouth every 8 (eight) hours as needed.  Dispense: 90 tablet; Refill: 3  5. Chronic obstructive pulmonary disease, unspecified COPD, unspecified chronic bronchitis type - CMP14+EGFR - Aclidinium Bromide (TUDORZA PRESSAIR) 400 MCG/ACT AEPB; Inhale 1 puff into the lungs as needed. Shortness of breath  Dispense: 1 each; Refill: 11  6. Gastroesophageal reflux disease, esophagitis presence not specified - CMP14+EGFR - omeprazole (PRILOSEC) 40 MG capsule; Take 1 capsule (40 mg total) by mouth daily.  Dispense: 90 capsule; Refill: 3  7. Hypokalemia - CMP14+EGFR - potassium chloride SA (K-DUR,KLOR-CON) 20 MEQ tablet; Take 1 tablet (20 mEq total) by mouth 2 (two) times daily.  Dispense: 180 tablet; Refill: 3  8. Essential hypertension - CMP14+EGFR - losartan-hydrochlorothiazide (HYZAAR) 100-12.5 MG per tablet; Take 1 tablet by mouth daily.  Dispense: 90 tablet; Refill: 3   Continue all meds Labs pending Health Maintenance reviewed Diet and exercise encouraged RTO 6 months  [name], FNP

## 2014-04-17 LAB — LIPID PANEL
Chol/HDL Ratio: 5.5 ratio units — ABNORMAL HIGH (ref 0.0–4.4)
Cholesterol, Total: 293 mg/dL — ABNORMAL HIGH (ref 100–199)
HDL: 53 mg/dL (ref >39)
LDL Calculated: 197 mg/dL — ABNORMAL HIGH (ref 0–99)
Triglycerides: 216 mg/dL — ABNORMAL HIGH (ref 0–149)
VLDL Cholesterol Cal: 43 mg/dL — ABNORMAL HIGH (ref 5–40)

## 2014-04-17 LAB — CMP14+EGFR
ALBUMIN: 4.6 g/dL (ref 3.6–4.8)
ALT: 26 IU/L (ref 0–32)
AST: 25 IU/L (ref 0–40)
Albumin/Globulin Ratio: 1.8 (ref 1.1–2.5)
Alkaline Phosphatase: 124 IU/L — ABNORMAL HIGH (ref 39–117)
BILIRUBIN TOTAL: 0.2 mg/dL (ref 0.0–1.2)
BUN / CREAT RATIO: 16 (ref 11–26)
BUN: 11 mg/dL (ref 8–27)
CALCIUM: 9.8 mg/dL (ref 8.7–10.3)
CHLORIDE: 96 mmol/L — AB (ref 97–108)
CO2: 27 mmol/L (ref 18–29)
CREATININE: 0.68 mg/dL (ref 0.57–1.00)
GFR calc non Af Amer: 95 mL/min/{1.73_m2} (ref >59)
GFR, EST AFRICAN AMERICAN: 110 mL/min/{1.73_m2} (ref >59)
GLOBULIN, TOTAL: 2.6 g/dL (ref 1.5–4.5)
Glucose: 112 mg/dL — ABNORMAL HIGH (ref 65–99)
Potassium: 4.3 mmol/L (ref 3.5–5.2)
Sodium: 140 mmol/L (ref 134–144)
Total Protein: 7.2 g/dL (ref 6.0–8.5)

## 2014-04-17 LAB — VITAMIN D 25 HYDROXY (VIT D DEFICIENCY, FRACTURES): VIT D 25 HYDROXY: 27.9 ng/mL — AB (ref 30.0–100.0)

## 2014-04-19 ENCOUNTER — Other Ambulatory Visit: Payer: Self-pay | Admitting: Family

## 2014-04-19 MED ORDER — VITAMIN D (ERGOCALCIFEROL) 1.25 MG (50000 UNIT) PO CAPS: 50000.0000 [IU] | ORAL_CAPSULE | ORAL | Status: DC

## 2014-04-19 MED ORDER — ROSUVASTATIN CALCIUM 20 MG PO TABS: 20.0000 mg | ORAL_TABLET | Freq: Every day | ORAL | Status: DC

## 2014-04-28 ENCOUNTER — Encounter (HOSPITAL_COMMUNITY): Payer: 59 | Attending: Oncology | Admitting: Oncology

## 2014-04-28 VITALS — BP 128/67 | HR 96 | Temp 98.0°F | Resp 20 | Wt 169.0 lb

## 2014-04-28 DIAGNOSIS — C50111 Malignant neoplasm of central portion of right female breast: Secondary | ICD-10-CM | POA: Diagnosis not present

## 2014-04-28 DIAGNOSIS — M8580 Other specified disorders of bone density and structure, unspecified site: Secondary | ICD-10-CM | POA: Diagnosis not present

## 2014-04-28 MED ORDER — LETROZOLE 2.5 MG PO TABS: 2.5000 mg | ORAL_TABLET | Freq: Every day | ORAL | Status: DC

## 2014-04-28 NOTE — Progress Notes (Signed)
[name], [place] [address]  Cancer of central portion of female breast, right - Plan: letrozole [facility]) 2.5 MG tablet  Osteopenia  CURRENT THERAPY: On Letrozole beginning on 03/09/2011  INTERVAL HISTORY: SHAKEDA PEARSE 59 y.o. female returns for followup of stage III C. (T4, N3) right-sided breast cancer. She had apparent axillary as well as intramammary lymph node involvement and suspicious ipsilateral subclavicular lymph node involvement. She took 6 cycles of carboplatin and docetaxel with trastuzumab every 21 days followed by surgery. She then had radiation therapy and finished one full year of trastuzumab (finishing on 07/10/2011). She then started on letrozole 2.5 mg on 03/09/2011 and she will take that for 5 years, or greater.   I personally reviewed and went over laboratory results with the patient.  The results are noted within this dictation.  I personally reviewed and went over radiographic studies with the patient.  The results are noted within this dictation.  Mammogram in December 2015 was negative.  Her last bone density in October 2015 demonstrated progressive osteopenia.  Therefore, we need to discuss Prolia intervention.  I discussed the risks, benefits, alternatives, and side effects of Prolia given the fact that she is on AI therapy which increases the risk of osteoporosis.  She is not on calcium at this time and therefore I educated her that she is to take 1200 mg of Ca++ and 800- 1000 units of Vit D.  I also spent time discussing breast cancer index testing.  This test provides information regarding the utility of extended endocrine therapy.  I reviewed the information regarding this test.  She is agreeable to this test.  She is unfortunately being let go from her job at [employer].  She had an interview at [employer] today and she hopes to ascertain this position.    Oncologically. She denies any complaints and  ROS questioning is negative.    Past Medical History  Diagnosis Date  . Hypertension   . Nipple discharge     with pain, infection, lump  . MRSA (methicillin resistant staph aureus) culture positive     in breast  . Wears dentures   . Breast CA 07/01/2010    rt breast ca  . Family history of breast cancer     Aunt on father's side  . Neuropathy     bilateral toes  . Hyperlipemia   . Status post chemotherapy     6 cycles of carboplatin and docetaxel with trastuzumab every 21 days followed by surgery  . Use of letrozole (Femara) 03/09/2011  . S/P radiation therapy 01/08/11 - 02/22/11    Right Chest Wall/ 50 Gy / 25 Fractions, Right [place]/ 50 gy/25 Fractions, Right Axillary Boost/500 cGy/25 Fractions, Right Chest Wall Boost/10 Gy/5 Fractions  . COPD (chronic obstructive pulmonary disease)   . Anxiety     has Stage III (T4N3) R Br cancer ; Hypertension; Hyperlipidemia; Shortness of breath; COPD (chronic obstructive pulmonary disease); Lymphedema of arm; Osteopenia; Vitamin D deficiency; GAD (generalized anxiety disorder); and GERD (gastroesophageal reflux disease) on her problem list.     has No Known Allergies.  Ms. Nakamura had no medications administered during this visit.  Past Surgical History  Procedure Laterality Date  . Tubal ligation    . Dilation and curettage of uterus    . Breast surgery  2012  . Port a catheter insertion    . Port-a-cath removal Left  01/15/2013    Procedure: REMOVAL PORT-A-CATH;  Surgeon: Scherry Ran, MD;  Location: [facility];  Service: General;  Laterality: Left;    Denies any headaches, dizziness, double vision, fevers, chills, night sweats, nausea, vomiting, diarrhea, constipation, chest pain, heart palpitations, shortness of breath, blood in stool, black tarry stool, urinary pain, urinary burning, urinary frequency, hematuria.   PHYSICAL EXAMINATION  ECOG PERFORMANCE STATUS: 0 - Asymptomatic  Filed Vitals:   04/28/14 1445  BP: 128/67    Pulse: 96  Temp: 98 F (36.7 C)  Resp: 20    GENERAL:alert, no distress, well nourished, well developed, comfortable, cooperative and smiling SKIN: skin color, texture, turgor are normal, no rashes or significant lesions HEAD: Normocephalic, No masses, lesions, tenderness or abnormalities EYES: normal, PERRLA, EOMI, Conjunctiva are pink and non-injected EARS: External ears normal OROPHARYNX:lips, buccal mucosa, and tongue normal and mucous membranes are moist  NECK: supple, no adenopathy, thyroid normal size, non-tender, without nodularity, no stridor, non-tender, trachea midline LYMPH:  no palpable lymphadenopathy, no hepatosplenomegaly BREAST:left breast normal without mass, skin or nipple changes or axillary nodes, right post-mastectomy site well healed and free of suspicious changes LUNGS: clear to auscultation and percussion HEART: regular rate & rhythm, no murmurs, no gallops, S1 normal and S2 normal ABDOMEN:abdomen soft, non-tender, obese, normal bowel sounds, no masses or organomegaly and no hepatosplenomegaly BACK: Back symmetric, no curvature., No CVA tenderness EXTREMITIES:less then 2 second capillary refill, no joint deformities, effusion, or inflammation, no edema, no skin discoloration, no clubbing, no cyanosis  NEURO: alert & oriented x 3 with fluent speech, no focal motor/sensory deficits, gait normal   LABORATORY DATA: CBC    Component Value Date/Time   WBC 8.7 11/13/2013 1500   WBC 9.8 12/03/2012 1032   WBC 4.7 11/07/2010 0917   RBC 4.93 11/13/2013 1500   RBC 4.8 12/03/2012 1032   RBC 3.10* 11/07/2010 0917   HGB 15.0 11/13/2013 1500   HGB 14.4 12/03/2012 1032   HGB 10.1* 11/07/2010 0917   HCT 45.4 11/13/2013 1500   HCT 44.5 12/03/2012 1032   HCT 31.1* 11/07/2010 0917   PLT 293 11/13/2013 1500   PLT 194 11/07/2010 0917   MCV 92.1 11/13/2013 1500   MCV 91.3 12/03/2012 1032   MCV 100.3 11/07/2010 0917   MCH 30.4 11/13/2013 1500   MCH 29.4 12/03/2012 1032    MCH 32.6 11/07/2010 0917   MCHC 33.0 11/13/2013 1500   MCHC 32.2 12/03/2012 1032   MCHC 32.5 11/07/2010 0917   RDW 14.9 11/13/2013 1500   RDW 18.3* 11/07/2010 0917   LYMPHSABS 2.1 11/13/2013 1500   LYMPHSABS 1.6 11/07/2010 0917   MONOABS 0.5 11/13/2013 1500   MONOABS 1.4* 11/07/2010 0917   EOSABS 0.4 11/13/2013 1500   EOSABS 0.0 11/07/2010 0917   BASOSABS 0.0 11/13/2013 1500   BASOSABS 0.0 11/07/2010 0917      Chemistry      Component Value Date/Time   NA 140 04/16/2014 1002   NA 141 11/13/2013 1500   K 4.3 04/16/2014 1002   CL 96* 04/16/2014 1002   CO2 27 04/16/2014 1002   BUN 11 04/16/2014 1002   BUN 12 11/13/2013 1500   CREATININE 0.68 04/16/2014 1002      Component Value Date/Time   CALCIUM 9.8 04/16/2014 1002   ALKPHOS 124* 04/16/2014 1002   AST 25 04/16/2014 1002   ALT 26 04/16/2014 1002   BILITOT 0.2 04/16/2014 1002   BILITOT <0.2* 11/13/2013 1500  ASSESSMENT AND PLAN:  Stage III (T4N3) R Br cancer  Stage III C. (T4, N3) right-sided breast cancer. She had apparent axillary as well as intramammary lymph node involvement and suspicious ipsilateral subclavicular lymph node involvement. She took 6 cycles of carboplatin and docetaxel with trastuzumab every 21 days followed by surgery. She then had radiation therapy and finished one full year of trastuzumab (finishing on 07/10/2011). She then started on letrozole 2.5 mg on 03/09/2011 and she will take that for 5 years, or greater.   She had labs completed 2 weeks ago and from an oncology standpoint, there is nothing needing addressing.  She is not to be Vit D deficient and this is being addressed by her primary care provider.  She is on Vit D supplementation.     Mammogram in December 2015 was negative and she will therefore be due for her next mammogram December 2016.    Labs in 6 months: CBC diff, CMET  Continue Letrozole daily.   We discussed the role of BCI testing.  This test provides information  regarding the utility of extended endocrine therapy. She is agreeable to have this test completed as it will provide important information for her to make the best decision when she comes up on her 5 year anniversary mark in Feb 2018.  Return in 6 months for follow-up.   Osteopenia Osteopenia in the setting of endocrine therapy with aromatase inhibitor.  Her T score has worsened since 2013.  As a result of her osteopenia while on AI therapy, she is a candidate for Prolia which I recommend.  This is an injection administered every 6 months.  Risks, benefits, alternatives (including bisphosphonate therapy), and side effects (including ONJ and hypocalcemia) discussed in detail.  Due to some impending insurance issues because she is losing her job, she would like to wait on this for now.  I have reviewed her medication list and she is not on Calcium supplementation.  Therefore, I have recommended 1200 mg of Ca++ daily, in addition to Vit D.   THERAPY PLAN:  NCCN guidelines recommends the following surveillance for invasive breast cancer:  A. History and Physical exam every 4-6 months for 5 years and then every 12 months.  B. Mammography every 12 months  C. Women on Tamoxifen: annual gynecologic assessment every 12 months if uterus is present.  D. Women on aromatase inhibitor or who experience ovarian failure secondary to treatment should have monitoring of bone health with a bone mineral density determination at baseline and periodically thereafter.  E. Assess and encourage adherence to adjuvant endocrine therapy.  F. Evidence suggests that active lifestyle and achieving and maintaining an ideal body weight (20-25 BMI) may lead to optimal breast cancer outcomes.   All questions were answered. The patient knows to call the clinic with any problems, questions or concerns. We can certainly see the patient much sooner if necessary.  Patient and plan discussed with Dr. Ancil Linsey and she is in  agreement with the aforementioned.   This note is electronically signed by: Robynn Pane 04/28/2014 3:47 PM

## 2014-04-28 NOTE — Assessment & Plan Note (Addendum)
Osteopenia in the setting of endocrine therapy with aromatase inhibitor.  Her T score has worsened since 2013.  As a result of her osteopenia while on AI therapy, she is a candidate for Prolia which I recommend.  This is an injection administered every 6 months.  Risks, benefits, alternatives (including bisphosphonate therapy), and side effects (including ONJ and hypocalcemia) discussed in detail.  Due to some impending insurance issues because she is losing her job, she would like to wait on this for now.  I have reviewed her medication list and she is not on Calcium supplementation.  Therefore, I have recommended 1200 mg of Ca++ daily, in addition to Vit D.

## 2014-04-28 NOTE — Assessment & Plan Note (Signed)
Stage III C. (T4, N3) right-sided breast cancer. She had apparent axillary as well as intramammary lymph node involvement and suspicious ipsilateral subclavicular lymph node involvement. She took 6 cycles of carboplatin and docetaxel with trastuzumab every 21 days followed by surgery. She then had radiation therapy and finished one full year of trastuzumab (finishing on 07/10/2011). She then started on letrozole 2.5 mg on 03/09/2011 and she will take that for 5 years, or greater.   She had labs completed 2 weeks ago and from an oncology standpoint, there is nothing needing addressing.  She is not to be Vit D deficient and this is being addressed by her primary care provider.  She is on Vit D supplementation.     Mammogram in December 2015 was negative and she will therefore be due for her next mammogram December 2016.    Labs in 6 months: CBC diff, CMET  Continue Letrozole daily.   We discussed the role of BCI testing.  This test provides information regarding the utility of extended endocrine therapy. She is agreeable to have this test completed as it will provide important information for her to make the best decision when she comes up on her 5 year anniversary mark in Feb 2018.  Return in 6 months for follow-up.

## 2014-04-28 NOTE — Patient Instructions (Signed)
[name] at [facility] Discharge Instructions  RECOMMENDATIONS MADE BY THE CONSULTANT AND ANY TEST RESULTS WILL BE SENT TO YOUR REFERRING PHYSICIAN.  A Sheila Garrison prescription was sent to [place] for Letrozole (90 day supply). Lab work in 6 months. MD appointment with Dr.Penland in 6 months. Consider Prolia injections. Mammogram annually. BCI testing to determine whether you will/will not need Letrozole for longer than 5 years.  Thank you for choosing [place] at [facility] to provide your oncology and hematology care.  To afford each patient quality time with our provider, please arrive at least 15 minutes before your scheduled appointment time.    You need to re-schedule your appointment should you arrive 10 or more minutes late.  We strive to give you quality time with our providers, and arriving late affects you and other patients whose appointments are after yours.  Also, if you no show three or more times for appointments you may be dismissed from the clinic at the providers discretion.     Again, thank you for choosing [facility].  Our hope is that these requests will decrease the amount of time that you wait before being seen by our physicians.       _____________________________________________________________  Should you have questions after your visit to [facility], please contact our office at [phone] between the hours of 8:30 a.m. and 4:30 p.m.  Voicemails left after 4:30 p.m. will not be returned until the following business day.  For prescription refill requests, have your pharmacy contact our office.

## 2014-05-06 ENCOUNTER — Other Ambulatory Visit: Payer: Self-pay | Admitting: Nurse Practitioner

## 2014-05-14 ENCOUNTER — Encounter (HOSPITAL_COMMUNITY): Payer: Self-pay

## 2014-05-19 ENCOUNTER — Ambulatory Visit (HOSPITAL_COMMUNITY): Payer: 59 | Admitting: Oncology

## 2014-10-07 ENCOUNTER — Other Ambulatory Visit (HOSPITAL_COMMUNITY): Payer: Self-pay

## 2014-10-07 ENCOUNTER — Telehealth: Payer: Self-pay | Admitting: Family

## 2014-10-07 DIAGNOSIS — M8580 Other specified disorders of bone density and structure, unspecified site: Secondary | ICD-10-CM

## 2014-10-07 DIAGNOSIS — K219 Gastro-esophageal reflux disease without esophagitis: Secondary | ICD-10-CM

## 2014-10-07 DIAGNOSIS — F411 Generalized anxiety disorder: Secondary | ICD-10-CM

## 2014-10-07 DIAGNOSIS — C50119 Malignant neoplasm of central portion of unspecified female breast: Secondary | ICD-10-CM

## 2014-10-07 DIAGNOSIS — I10 Essential (primary) hypertension: Secondary | ICD-10-CM

## 2014-10-07 DIAGNOSIS — R5383 Other fatigue: Secondary | ICD-10-CM

## 2014-10-07 DIAGNOSIS — J449 Chronic obstructive pulmonary disease, unspecified: Secondary | ICD-10-CM

## 2014-10-07 DIAGNOSIS — E559 Vitamin D deficiency, unspecified: Secondary | ICD-10-CM

## 2014-10-07 DIAGNOSIS — E785 Hyperlipidemia, unspecified: Secondary | ICD-10-CM

## 2014-10-08 NOTE — Telephone Encounter (Signed)
Lab orders placed in EPIC

## 2014-10-09 ENCOUNTER — Other Ambulatory Visit: Payer: PRIVATE HEALTH INSURANCE

## 2014-10-09 DIAGNOSIS — M8580 Other specified disorders of bone density and structure, unspecified site: Secondary | ICD-10-CM

## 2014-10-09 DIAGNOSIS — J449 Chronic obstructive pulmonary disease, unspecified: Secondary | ICD-10-CM

## 2014-10-09 DIAGNOSIS — K219 Gastro-esophageal reflux disease without esophagitis: Secondary | ICD-10-CM

## 2014-10-09 DIAGNOSIS — R5383 Other fatigue: Secondary | ICD-10-CM

## 2014-10-09 DIAGNOSIS — E785 Hyperlipidemia, unspecified: Secondary | ICD-10-CM

## 2014-10-09 DIAGNOSIS — F411 Generalized anxiety disorder: Secondary | ICD-10-CM

## 2014-10-09 DIAGNOSIS — I10 Essential (primary) hypertension: Secondary | ICD-10-CM

## 2014-10-09 DIAGNOSIS — E559 Vitamin D deficiency, unspecified: Secondary | ICD-10-CM

## 2014-10-12 ENCOUNTER — Ambulatory Visit (INDEPENDENT_AMBULATORY_CARE_PROVIDER_SITE_OTHER): Payer: PRIVATE HEALTH INSURANCE | Admitting: Family

## 2014-10-12 ENCOUNTER — Encounter: Payer: Self-pay | Admitting: Family

## 2014-10-12 VITALS — BP 135/77 | HR 90 | Temp 98.6°F | Ht 64.0 in | Wt 159.6 lb

## 2014-10-12 DIAGNOSIS — K219 Gastro-esophageal reflux disease without esophagitis: Secondary | ICD-10-CM

## 2014-10-12 DIAGNOSIS — Z1211 Encounter for screening for malignant neoplasm of colon: Secondary | ICD-10-CM | POA: Diagnosis not present

## 2014-10-12 DIAGNOSIS — E559 Vitamin D deficiency, unspecified: Secondary | ICD-10-CM | POA: Diagnosis not present

## 2014-10-12 DIAGNOSIS — E785 Hyperlipidemia, unspecified: Secondary | ICD-10-CM

## 2014-10-12 DIAGNOSIS — Z1159 Encounter for screening for other viral diseases: Secondary | ICD-10-CM

## 2014-10-12 DIAGNOSIS — F411 Generalized anxiety disorder: Secondary | ICD-10-CM | POA: Diagnosis not present

## 2014-10-12 DIAGNOSIS — I10 Essential (primary) hypertension: Secondary | ICD-10-CM

## 2014-10-12 DIAGNOSIS — J449 Chronic obstructive pulmonary disease, unspecified: Secondary | ICD-10-CM | POA: Diagnosis not present

## 2014-10-12 DIAGNOSIS — C50119 Malignant neoplasm of central portion of unspecified female breast: Secondary | ICD-10-CM | POA: Diagnosis not present

## 2014-10-12 DIAGNOSIS — M8580 Other specified disorders of bone density and structure, unspecified site: Secondary | ICD-10-CM

## 2014-10-12 LAB — LIPID PANEL
CHOL/HDL RATIO: 2.6 ratio (ref 0.0–4.4)
Cholesterol, Total: 135 mg/dL (ref 100–199)
HDL: 51 mg/dL (ref >39)
LDL Calculated: 59 mg/dL (ref 0–99)
TRIGLYCERIDES: 123 mg/dL (ref 0–149)
VLDL Cholesterol Cal: 25 mg/dL (ref 5–40)

## 2014-10-12 LAB — CMP14+EGFR
ALT: 14 IU/L (ref 0–32)
AST: 18 IU/L (ref 0–40)
Albumin/Globulin Ratio: 1.7 (ref 1.1–2.5)
Albumin: 4.2 g/dL (ref 3.6–4.8)
Alkaline Phosphatase: 91 IU/L (ref 39–117)
BUN/Creatinine Ratio: 26 (ref 11–26)
BUN: 16 mg/dL (ref 8–27)
Bilirubin Total: <0.2 mg/dL (ref 0.0–1.2)
CALCIUM: 9.5 mg/dL (ref 8.7–10.3)
CO2: 25 mmol/L (ref 18–29)
Chloride: 99 mmol/L (ref 97–108)
Creatinine, Ser: 0.61 mg/dL (ref 0.57–1.00)
GFR, EST AFRICAN AMERICAN: 114 mL/min/{1.73_m2} (ref >59)
GFR, EST NON AFRICAN AMERICAN: 99 mL/min/{1.73_m2} (ref >59)
GLOBULIN, TOTAL: 2.5 g/dL (ref 1.5–4.5)
Glucose: 95 mg/dL (ref 65–99)
Potassium: 4.4 mmol/L (ref 3.5–5.2)
SODIUM: 143 mmol/L (ref 134–144)
TOTAL PROTEIN: 6.7 g/dL (ref 6.0–8.5)

## 2014-10-12 LAB — THYROID PANEL WITH TSH
FREE THYROXINE INDEX: 1.9 (ref 1.2–4.9)
T3 UPTAKE RATIO: 26 % (ref 24–39)
T4, Total: 7.3 ug/dL (ref 4.5–12.0)
TSH: 0.899 u[IU]/mL (ref 0.450–4.500)

## 2014-10-12 LAB — VITAMIN D 25 HYDROXY (VIT D DEFICIENCY, FRACTURES): VIT D 25 HYDROXY: 71.2 ng/mL (ref 30.0–100.0)

## 2014-10-12 MED ORDER — OMEPRAZOLE 40 MG PO CPDR: 40.0000 mg | DELAYED_RELEASE_CAPSULE | Freq: Every day | ORAL | Status: DC

## 2014-10-12 MED ORDER — LORAZEPAM 0.5 MG PO TABS: 0.5000 mg | ORAL_TABLET | Freq: Three times a day (TID) | ORAL | Status: DC | PRN

## 2014-10-12 MED ORDER — LOSARTAN POTASSIUM-HCTZ 100-12.5 MG PO TABS: 1.0000 | ORAL_TABLET | Freq: Every day | ORAL | Status: DC

## 2014-10-12 MED ORDER — ROSUVASTATIN CALCIUM 20 MG PO TABS: 20.0000 mg | ORAL_TABLET | Freq: Every day | ORAL | Status: DC

## 2014-10-12 NOTE — Patient Instructions (Signed)

## 2014-10-12 NOTE — Progress Notes (Addendum)
Subjective:    Patient ID: Sheila Garrison, female    DOB: 1955-08-23, 59 y.o.   MRN: [id number]  PT presents to the office today for chronic follow up.  Hypertension This is a chronic problem. The current episode started more than 1 year ago. The problem has been resolved since onset. The problem is controlled. Associated symptoms include anxiety and shortness of breath ("from radiation and only at times"). Pertinent negatives include no headaches, palpitations or peripheral edema. Risk factors for coronary artery disease include dyslipidemia, post-menopausal state, smoking/tobacco exposure, stress and family history. Past treatments include angiotensin blockers and diuretics. The current treatment provides moderate improvement. Compliance problems include diet.  There is no history of kidney disease, CAD/MI, CVA, heart failure or a thyroid problem. There is no history of sleep apnea.  Hyperlipidemia This is a chronic problem. The current episode started more than 1 year ago. The problem is controlled. Recent lipid tests were reviewed and are normal. She has no history of diabetes or hypothyroidism. Factors aggravating her hyperlipidemia include smoking and fatty foods. Associated symptoms include shortness of breath ("from radiation and only at times"). Pertinent negatives include no leg pain or myalgias. Current antihyperlipidemic treatment includes statins. The current treatment provides moderate improvement of lipids. Risk factors for coronary artery disease include dyslipidemia, family history, hypertension and post-menopausal.  Anxiety Presents for follow-up visit. Onset was 1 to 6 months ago. The problem has been waxing and waning. Symptoms include excessive worry, nervous/anxious behavior and shortness of breath ("from radiation and only at times"). Patient reports no decreased concentration, depressed mood, insomnia, palpitations or panic. Symptoms occur rarely. Nighttime awakenings: none.    Her past medical history is significant for anxiety/panic attacks. There is no history of depression. Past treatments include benzodiazephines.  Gastrophageal Reflux She reports no belching, no coughing, no heartburn, no sore throat or no water brash. This is a chronic problem. The current episode started more than 1 year ago. The problem occurs rarely. The problem has been resolved. The symptoms are aggravated by certain foods. Pertinent negatives include no muscle weakness. She has tried a PPI for the symptoms. The treatment provided significant relief.      Review of Systems  Constitutional: Negative.   HENT: Negative.  Negative for sore throat.   Eyes: Negative.   Respiratory: Positive for shortness of breath ("from radiation and only at times"). Negative for cough.   Cardiovascular: Negative.  Negative for palpitations.  Gastrointestinal: Negative.  Negative for heartburn.  Endocrine: Negative.   Genitourinary: Negative.   Musculoskeletal: Negative.  Negative for myalgias and muscle weakness.  Neurological: Negative.  Negative for headaches.  Hematological: Negative.   Psychiatric/Behavioral: Negative for decreased concentration. The patient is nervous/anxious. The patient does not have insomnia.   All other systems reviewed and are negative.      Objective:   Physical Exam  Constitutional: She is oriented to person, place, and time. She appears well-developed and well-nourished. No distress.  HENT:  Head: Normocephalic and atraumatic.  Right Ear: External ear normal.  Left Ear: External ear normal.  Nose: Nose normal.  Mouth/Throat: Oropharynx is clear and moist.  Eyes: Pupils are equal, round, and reactive to light.  Neck: Normal range of motion. Neck supple. No thyromegaly present.  Cardiovascular: Normal rate, regular rhythm, normal heart sounds and intact distal pulses.   No murmur heard. Pulmonary/Chest: Effort normal and breath sounds normal. No respiratory  distress. She has no wheezes.  Diminished breath sounds bilaterally  Abdominal: Soft. Bowel sounds are normal. She exhibits no distension. There is no tenderness.  Musculoskeletal: Normal range of motion. She exhibits no edema or tenderness.  Neurological: She is alert and oriented to person, place, and time. She has normal reflexes. No cranial nerve deficit.  Skin: Skin is warm and dry.  Psychiatric: She has a normal mood and affect. Her behavior is normal. Judgment and thought content normal.  Vitals reviewed.     BP 135/77 mmHg  Pulse 90  Temp(Src) 98.6 F (37 C) (Oral)  Ht 5\' 4"  (1.626 m)  Wt 159 lb 9.6 oz (72.394 kg)  BMI 27.38 kg/m2     Assessment & Plan:  1. Essential hypertension - losartan-hydrochlorothiazide (HYZAAR) 100-12.5 MG per tablet; Take 1 tablet by mouth daily.  Dispense: 90 tablet; Refill: 3  2. Chronic obstructive pulmonary disease, unspecified COPD, unspecified chronic bronchitis type  3. Gastroesophageal reflux disease, esophagitis presence not specified - omeprazole (PRILOSEC) 40 MG capsule; Take 1 capsule (40 mg total) by mouth daily.  Dispense: 90 capsule; Refill: 3  4. Osteopenia  5. Cancer of central portion of female breast, unspecified laterality  6. Hyperlipidemia - rosuvastatin (CRESTOR) 20 MG tablet; Take 1 tablet (20 mg total) by mouth daily.  Dispense: 90 tablet; Refill: 3  7. Vitamin D deficiency  8. GAD (generalized anxiety disorder) - LORazepam (ATIVAN) 0.5 MG tablet; Take 1 tablet (0.5 mg total) by mouth every 8 (eight) hours as needed.  Dispense: 90 tablet; Refill: 3   10. Colon cancer screening - Fecal occult blood, imunochemical   Continue all meds Labs discussed Health Maintenance reviewed-hemoccult cards given to patient with directions Diet and exercise encouraged RTO 6 months  [name], FNP

## 2014-10-12 NOTE — Addendum Note (Signed)
Addended by: [name] A on: 10/12/2014 04:29 PM   Modules accepted: Orders

## 2014-10-16 ENCOUNTER — Other Ambulatory Visit: Payer: Self-pay | Admitting: *Deleted

## 2014-10-16 DIAGNOSIS — E785 Hyperlipidemia, unspecified: Secondary | ICD-10-CM

## 2014-10-20 ENCOUNTER — Telehealth: Payer: Self-pay

## 2014-10-20 NOTE — Telephone Encounter (Signed)
Insurance prior authorized [employer]

## 2014-10-28 ENCOUNTER — Ambulatory Visit (HOSPITAL_COMMUNITY): Payer: 59 | Admitting: Hematology & Oncology

## 2014-10-28 ENCOUNTER — Other Ambulatory Visit (HOSPITAL_COMMUNITY): Payer: 59

## 2014-11-02 ENCOUNTER — Other Ambulatory Visit: Payer: Self-pay | Admitting: Family

## 2014-11-03 NOTE — Telephone Encounter (Signed)
Done, called in from visit on 10/2014

## 2014-11-28 NOTE — Assessment & Plan Note (Addendum)
Stage III C. (T4, N3) right-sided breast cancer. She had apparent axillary as well as intramammary lymph node involvement and suspicious ipsilateral subclavicular lymph node involvement. She took 6 cycles of carboplatin and docetaxel with trastuzumab every 21 days followed by surgery. She then had radiation therapy and finished one full year of trastuzumab (finishing on 07/10/2011). She then started on letrozole 2.5 mg on 03/09/2011.  Mammogram in December 2015 was negative and she will therefore be due for her next mammogram December 2016.    Labs in 6 months: CBC diff, CMET  Continue Letrozole daily.   We reviewed her BCI testing results which revealed a benefit of extended endocrine therapy.  We discussed what this means to her and her treatment goals.  She is interested in pursuing endocrine therapy past 5 years which we can discuss in more detail when we get closer to her 5 year anniversary with endocrine manipulation.  Return in 6 months for follow-up.

## 2014-11-28 NOTE — Assessment & Plan Note (Addendum)
Osteopenia in the setting of endocrine therapy with aromatase inhibitor.  Her T score has worsened since 2013.  As a result of her osteopenia while on AI therapy, she is a candidate for Prolia which I recommend.  Risks, benefits, alternatives (including bisphosphonate therapy), and side effects (including ONJ and hypocalcemia) discussed in detail.  She is currently taking Ca++ and Vit D.  Due to insurance issues, she declines therapy at this time, but will continue with Ca++ and Vit D.

## 2014-11-28 NOTE — Progress Notes (Signed)
Sheila Garrison, [place] [id number]  Malignant neoplasm of central portion of right female breast [facility]) - Plan: CBC with Differential, Comprehensive metabolic panel  Osteopenia  Cancer of central portion of female breast, right - Plan: letrozole (FEMARA) 2.5 MG tablet  CURRENT THERAPY: On Letrozole beginning on 03/09/2011  INTERVAL HISTORY: Sheila Garrison 59 y.o. female returns for followup of stage III C. (T4, N3) right-sided breast cancer. She had apparent axillary as well as intramammary lymph node involvement and suspicious ipsilateral subclavicular lymph node involvement. She took 6 cycles of carboplatin and docetaxel with trastuzumab every 21 days followed by surgery. She then had radiation therapy and finished one full year of trastuzumab (finishing on 07/10/2011). She then started on letrozole 2.5 mg on 03/09/2011 and she will take that for 5 years, or greater.   I personally reviewed and went over laboratory results with the patient.  The results are noted within this dictation.  I personally reviewed and went over radiographic studies with the patient.  The results are noted within this dictation.  Mammogram in December 2015 was negative.  Her last bone density in October 2015 demonstrated progressive osteopenia.  Therefore, we need to discuss Prolia intervention.  I discussed the risks, benefits, alternatives, and side effects of Prolia given the fact that she is on AI therapy which increases the risk of osteoporosis.  She is not on calcium at this time and therefore I educated her that she is to take 1200 mg of Ca++ and 800- 1000 units of Vit D.  This information was discussed 6 months ago, but at the time, she was preparing to leave her job at [employer] and therefore, she wished to wait on this to occur from better evaluation of her own financial situation.  We re-addressed this again today.  She still is concerned about her cost because "my insurance is not  what it used to be."  She will continue with Ca++ and Vit D.  I reviewed her BCI testing results.  She would benefit from extended endocrine therapy.  She is interested in pursuing extended endocrine therapy.  She is currently working at [employer].    She saw Dr. Isidore Moos recently who performed a breast exam.  She is due for her next mammogram in November 2016.  Oncologically. She denies any complaints and ROS questioning is negative.    Past Medical History  Diagnosis Date  . Hypertension   . Nipple discharge     with pain, infection, lump  . MRSA (methicillin resistant staph aureus) culture positive     in breast  . Wears dentures   . Breast CA ([place]) 07/01/2010    rt breast ca  . Family history of breast cancer     Aunt on father's side  . Neuropathy (H[facility])     bilateral toes  . Hyperlipemia   . Status post chemotherapy     6 cycles of carboplatin and docetaxel with trastuzumab every 21 days followed by surgery  . Use of letrozole (Femara) 03/09/2011  . S/P radiation therapy 01/08/11 - 02/22/11    Right Chest Wall/ 50 Gy / 25 Fractions, Right  Region/ 50 gy/25 Fractions, Right Axillary Boost/500 cGy/25 Fractions, Right Chest Wall Boost/10 Gy/5 Fractions  . COPD (chronic obstructive pulmonary disease) ([place])   . Anxiety     has Stage III (T4N3) R Br cancer ; Hypertension; Hyperlipidemia; Shortness of breath; COPD (chronic obstructive  pulmonary disease) ([place]); Lymphedema of arm; Osteopenia; Vitamin D deficiency; GAD (generalized anxiety disorder); and GERD (gastroesophageal reflux disease) on her problem list.     has No Known Allergies.  [name] had no medications administered during this visit.  Past Surgical History  Procedure Laterality Date  . Tubal ligation    . Dilation and curettage of uterus    . Breast surgery  2012  . Port a catheter insertion    . Port-a-cath removal Left 01/15/2013    Procedure: REMOVAL PORT-A-CATH;  Surgeon: Scherry Ran, MD;  Location: [facility];  Service: General;  Laterality: Left;    Denies any headaches, dizziness, double vision, fevers, chills, night sweats, nausea, vomiting, diarrhea, constipation, chest pain, heart palpitations, shortness of breath, blood in stool, black tarry stool, urinary pain, urinary burning, urinary frequency, hematuria.   PHYSICAL EXAMINATION  ECOG PERFORMANCE STATUS: 0 - Asymptomatic  Filed Vitals:   11/29/14 1422  BP: 116/46  Pulse: 81  Temp: 97.8 F (36.6 C)  Resp: 18    GENERAL:alert, no distress, well nourished, well developed, comfortable, cooperative and smiling, unaccompanied SKIN: skin color, texture, turgor are normal, no rashes or significant lesions HEAD: Normocephalic, No masses, lesions, tenderness or abnormalities EYES: normal, PERRLA, EOMI, Conjunctiva are pink and non-injected EARS: External ears normal OROPHARYNX:lips, buccal mucosa, and tongue normal and mucous membranes are moist  NECK: supple, no adenopathy LYMPH:  no palpable lymphadenopathy BREAST:Exam declined.  Patient educated on self-breast exams LUNGS: clear to auscultation and percussion HEART: regular rate & rhythm, no murmurs, no gallops, S1 normal and S2 normal ABDOMEN:abdomen soft, non-tender, obese, normal bowel sounds. BACK: Back symmetric, no curvature., No CVA tenderness EXTREMITIES:less then 2 second capillary refill, no joint deformities, effusion, or inflammation, no edema, no skin discoloration, no clubbing, no cyanosis  NEURO: alert & oriented x 3 with fluent speech, no focal motor/sensory deficits, gait normal   LABORATORY DATA: CBC    Component Value Date/Time   WBC 8.7 11/13/2013 1500   WBC 9.8 12/03/2012 1032   WBC 4.7 11/07/2010 0917   RBC 4.93 11/13/2013 1500   RBC 4.8 12/03/2012 1032   RBC 3.10* 11/07/2010 0917   HGB 15.0 11/13/2013 1500   HGB 14.4 12/03/2012 1032   HGB 10.1* 11/07/2010 0917   HCT 45.4 11/13/2013 1500   HCT 44.5 12/03/2012 1032    HCT 31.1* 11/07/2010 0917   PLT 293 11/13/2013 1500   PLT 194 11/07/2010 0917   MCV 92.1 11/13/2013 1500   MCV 91.3 12/03/2012 1032   MCV 100.3 11/07/2010 0917   MCH 30.4 11/13/2013 1500   MCH 29.4 12/03/2012 1032   MCH 32.6 11/07/2010 0917   MCHC 33.0 11/13/2013 1500   MCHC 32.2 12/03/2012 1032   MCHC 32.5 11/07/2010 0917   RDW 14.9 11/13/2013 1500   RDW 18.3* 11/07/2010 0917   LYMPHSABS 2.1 11/13/2013 1500   LYMPHSABS 1.6 11/07/2010 0917   MONOABS 0.5 11/13/2013 1500   MONOABS 1.4* 11/07/2010 0917   EOSABS 0.4 11/13/2013 1500   EOSABS 0.0 11/07/2010 0917   BASOSABS 0.0 11/13/2013 1500   BASOSABS 0.0 11/07/2010 0917      Chemistry      Component Value Date/Time   NA 143 10/09/2014 0847   NA 141 11/13/2013 1500   K 4.4 10/09/2014 0847   CL 99 10/09/2014 0847   CO2 25 10/09/2014 0847   BUN 16 10/09/2014 0847   BUN 12 11/13/2013 1500   CREATININE 0.61 10/09/2014 0847  Component Value Date/Time   CALCIUM 9.5 10/09/2014 0847   ALKPHOS 91 10/09/2014 0847   AST 18 10/09/2014 0847   ALT 14 10/09/2014 0847   BILITOT <0.2 10/09/2014 0847   BILITOT <0.2* 11/13/2013 1500        ASSESSMENT AND PLAN:  Stage III (T4N3) R Br cancer  Stage III C. (T4, N3) right-sided breast cancer. She had apparent axillary as well as intramammary lymph node involvement and suspicious ipsilateral subclavicular lymph node involvement. She took 6 cycles of carboplatin and docetaxel with trastuzumab every 21 days followed by surgery. She then had radiation therapy and finished one full year of trastuzumab (finishing on 07/10/2011). She then started on letrozole 2.5 mg on 03/09/2011.  Mammogram in December 2015 was negative and she will therefore be due for her next mammogram December 2016.    Labs in 6 months: CBC diff, CMET  Continue Letrozole daily.   We reviewed her BCI testing results which revealed a benefit of extended endocrine therapy.  We discussed what this means to her and her  treatment goals.  She is interested in pursuing endocrine therapy past 5 years which we can discuss in more detail when we get closer to her 5 year anniversary with endocrine manipulation.  Return in 6 months for follow-up.    Osteopenia Osteopenia in the setting of endocrine therapy with aromatase inhibitor.  Her T score has worsened since 2013.  As a result of her osteopenia while on AI therapy, she is a candidate for Prolia which I recommend.  Risks, benefits, alternatives (including bisphosphonate therapy), and side effects (including ONJ and hypocalcemia) discussed in detail.  She is currently taking Ca++ and Vit D.  Due to insurance issues, she declines therapy at this time, but will continue with Ca++ and Vit D.     THERAPY PLAN:  NCCN guidelines recommends the following surveillance for invasive breast cancer:  A. History and Physical exam every 4-6 months for 5 years and then every 12 months.  B. Mammography every 12 months  C. Women on Tamoxifen: annual gynecologic assessment every 12 months if uterus is present.  D. Women on aromatase inhibitor or who experience ovarian failure secondary to treatment should have monitoring of bone health with a bone mineral density determination at baseline and periodically thereafter.  E. Assess and encourage adherence to adjuvant endocrine therapy.  F. Evidence suggests that active lifestyle and achieving and maintaining an ideal body weight (20-25 BMI) may lead to optimal breast cancer outcomes.   All questions were answered. The patient knows to call the clinic with any problems, questions or concerns. We can certainly see the patient much sooner if necessary.  Patient and plan discussed with Dr. Ancil Linsey and she is in agreement with the aforementioned.   This note is electronically signed by: Robynn Pane 11/29/2014 3:17 PM

## 2014-11-29 ENCOUNTER — Encounter (HOSPITAL_COMMUNITY): Payer: Self-pay | Admitting: Oncology

## 2014-11-29 ENCOUNTER — Other Ambulatory Visit: Payer: Self-pay | Admitting: Family

## 2014-11-29 ENCOUNTER — Encounter (HOSPITAL_COMMUNITY): Payer: 59 | Attending: Oncology | Admitting: Oncology

## 2014-11-29 ENCOUNTER — Encounter (HOSPITAL_COMMUNITY): Payer: 59

## 2014-11-29 VITALS — BP 116/46 | HR 81 | Temp 97.8°F | Resp 18 | Wt 160.7 lb

## 2014-11-29 DIAGNOSIS — J449 Chronic obstructive pulmonary disease, unspecified: Secondary | ICD-10-CM

## 2014-11-29 DIAGNOSIS — C50119 Malignant neoplasm of central portion of unspecified female breast: Secondary | ICD-10-CM

## 2014-11-29 DIAGNOSIS — C50111 Malignant neoplasm of central portion of right female breast: Secondary | ICD-10-CM | POA: Diagnosis not present

## 2014-11-29 DIAGNOSIS — M8580 Other specified disorders of bone density and structure, unspecified site: Secondary | ICD-10-CM

## 2014-11-29 DIAGNOSIS — E876 Hypokalemia: Secondary | ICD-10-CM

## 2014-11-29 MED ORDER — LETROZOLE 2.5 MG PO TABS: 2.5000 mg | ORAL_TABLET | Freq: Every day | ORAL | Status: DC

## 2014-11-29 NOTE — Progress Notes (Signed)
..  Sheila Garrison's reason for visit today are for labs as scheduled per MD orders.  Venipuncture performed with a 23 gauge butterfly needle to L Antecubital.  Sheila Garrison tolerated venipuncture well and without incident; questions were answered and patient was discharged.

## 2014-11-29 NOTE — Patient Instructions (Addendum)
..  [place] at [facility] Discharge Instructions  RECOMMENDATIONS MADE BY THE CONSULTANT AND ANY TEST RESULTS WILL BE SENT TO YOUR REFERRING PHYSICIAN. No Labs today Remember your mammogram Labs in 6 months Return in 6 months   Thank you for choosing [place] at [facility] to provide your oncology and hematology care.  To afford each patient quality time with our provider, please arrive at least 15 minutes before your scheduled appointment time.    You need to re-schedule your appointment should you arrive 10 or more minutes late.  We strive to give you quality time with our providers, and arriving late affects you and other patients whose appointments are after yours.  Also, if you no show three or more times for appointments you may be dismissed from the clinic at the providers discretion.     Again, thank you for choosing [facility].  Our hope is that these requests will decrease the amount of time that you wait before being seen by our physicians.       _____________________________________________________________  Should you have questions after your visit to [facility], please contact our office at [phone] between the hours of 8:30 a.m. and 4:30 p.m.  Voicemails left after 4:30 p.m. will not be returned until the following business day.  For prescription refill requests, have your pharmacy contact our office.

## 2014-11-30 MED ORDER — ACLIDINIUM BROMIDE 400 MCG/ACT IN AEPB: 1.0000 | INHALATION_SPRAY | RESPIRATORY_TRACT | Status: DC | PRN

## 2014-11-30 MED ORDER — POTASSIUM CHLORIDE CRYS ER 20 MEQ PO TBCR: 20.0000 meq | EXTENDED_RELEASE_TABLET | Freq: Two times a day (BID) | ORAL | Status: DC

## 2014-11-30 NOTE — Telephone Encounter (Signed)
done

## 2015-04-05 ENCOUNTER — Encounter: Payer: PRIVATE HEALTH INSURANCE | Admitting: *Deleted

## 2015-04-05 LAB — HM MAMMOGRAPHY: HM MAMMO: NEGATIVE

## 2015-04-08 ENCOUNTER — Telehealth: Payer: Self-pay | Admitting: Family

## 2015-04-08 ENCOUNTER — Encounter: Payer: Self-pay | Admitting: *Deleted

## 2015-04-08 MED ORDER — BUDESONIDE-FORMOTEROL FUMARATE 160-4.5 MCG/ACT IN AERO: 2.0000 | INHALATION_SPRAY | Freq: Two times a day (BID) | RESPIRATORY_TRACT | Status: DC

## 2015-04-08 NOTE — Telephone Encounter (Signed)
Left message for pt that RX was sent into the pharmacy per request

## 2015-04-08 NOTE — Telephone Encounter (Signed)
Symbicort Prescription sent to pharmacy   

## 2015-05-11 ENCOUNTER — Telehealth: Payer: Self-pay | Admitting: Family

## 2015-05-12 ENCOUNTER — Other Ambulatory Visit: Payer: Self-pay

## 2015-05-12 DIAGNOSIS — F411 Generalized anxiety disorder: Secondary | ICD-10-CM

## 2015-05-12 MED ORDER — LORAZEPAM 0.5 MG PO TABS: 0.5000 mg | ORAL_TABLET | Freq: Three times a day (TID) | ORAL | Status: DC | PRN

## 2015-05-12 NOTE — Telephone Encounter (Signed)
Sent to [facility]

## 2015-05-12 NOTE — Telephone Encounter (Signed)
Last seen 10/12/14  Sheila Garrison   If approved route to nurse to call into [facility]

## 2015-05-29 NOTE — Progress Notes (Signed)
NO SHOW

## 2015-05-29 NOTE — Assessment & Plan Note (Deleted)
Osteopenia in the setting of endocrine therapy with aromatase inhibitor.  Her T score has worsened since 2013.  As a result of her osteopenia while on AI therapy, she is a candidate for Prolia which I recommend.  Risks, benefits, alternatives (including bisphosphonate therapy), and side effects (including ONJ and hypocalcemia) discussed in detail.  She is currently taking Ca++ and Vit D.  Due to insurance issues, she declines therapy at this time, but will continue with Ca++ and Vit D.   

## 2015-05-29 NOTE — Assessment & Plan Note (Deleted)
Stage III C. (T4, N3) right-sided breast cancer. She had apparent axillary as well as intramammary lymph node involvement and suspicious ipsilateral subclavicular lymph node involvement. She took 6 cycles of carboplatin and docetaxel with trastuzumab every 21 days followed by surgery. She then had radiation therapy and finished one full year of trastuzumab (finishing on 07/10/2011). She then started on letrozole 2.5 mg on 03/09/2011.  BCI testing demonstrates low risk of recurrence, but high likelihood of benefit from extended endocrine therapy.  Therefore, she is interested in pursuing anti-endocrine therapy past 5 years.  Labs in 6 months: CBC diff, CMET  Continue Letrozole daily.   Return in 6 months for follow-up.

## 2015-05-30 ENCOUNTER — Ambulatory Visit (HOSPITAL_COMMUNITY): Payer: 59 | Admitting: Oncology

## 2015-05-30 ENCOUNTER — Other Ambulatory Visit (HOSPITAL_COMMUNITY): Payer: No Typology Code available for payment source

## 2015-05-30 ENCOUNTER — Other Ambulatory Visit (HOSPITAL_COMMUNITY): Payer: 59

## 2015-05-30 ENCOUNTER — Ambulatory Visit (HOSPITAL_COMMUNITY): Payer: No Typology Code available for payment source | Admitting: Oncology

## 2015-06-21 NOTE — Assessment & Plan Note (Signed)
Osteopenia in the setting of endocrine therapy with aromatase inhibitor.  Her T score has worsened since 2013.  As a result of her osteopenia while on AI therapy, she is a candidate for Prolia which I recommend.  Risks, benefits, alternatives (including bisphosphonate therapy), and side effects (including ONJ and hypocalcemia) discussed in detail.  She is currently taking Ca++ and Vit D.  Due to insurance issues, she declines therapy at this time, but will continue with Ca++ and Vit D.

## 2015-06-21 NOTE — Progress Notes (Addendum)
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 female returns for followup of stage III C. (T4, N3) right-sided breast cancer. She had apparent axillary as well as intramammary lymph node involvement and suspicious ipsilateral subclavicular lymph node involvement. She took 6 cycles of carboplatin and docetaxel with trastuzumab every 21 days followed by surgery. She then had radiation therapy and finished one full year of trastuzumab (finishing on 07/10/2011). She then started on letrozole 2.5 mg on 03/09/2011. BCI testing demonstrates low risk of recurrence, but high likelihood of benefit from extended endocrine therapy. Therefore, she is interested in pursuing anti-endocrine therapy past 5 years.    Stage III (T4N3) R Br cancer    06/21/2010 Initial Diagnosis Stage III (T4N3) R Br cancer    01/04/2011 - 02/22/2011 Radiation Therapy 50 Gy    05/07/2014 Pathology Results BCI testing- low risk of late recurrence (3.4% between years 5-10), high likelihood of benefit from extended endocrein therapy, and a 67% relative risk reduction when treated with extended endocrine therapy (27% versus 10.5%).    I personally reviewed and went over laboratory results with the patient.  The results are noted within this dictation.  I personally reviewed and went over radiographic studies with the patient. The results are noted within this dictation. Mammogram on 04/05/2015 is WNL.  She is currently working at [employer].   Oncologically. She denies any complaints and ROS questioning is negative.  She notes a cough  productive of green sputum with a post-nasal drip.  She denies any headaches.  She denies any fevers.  She denies a sore throat.  NKDA.  She admits that she is back to smoking after quitting.  She is smoking 1/4 ppd.  Review of Systems  Constitutional: Positive for malaise/fatigue. Negative for fever, chills and weight loss.  HENT: Positive for congestion. Negative for nosebleeds and sore throat.   Eyes: Negative for blurred vision and double vision.  Respiratory: Positive for cough and sputum production. Negative for hemoptysis.   Cardiovascular: Negative for chest pain.  Gastrointestinal: Negative for nausea, vomiting, abdominal pain, diarrhea, constipation, blood in stool and melena.  Genitourinary: Negative for dysuria.  Musculoskeletal: Negative.   Skin: Negative.   Neurological: Negative.  Negative for headaches.  Endo/Heme/Allergies: Negative.   Psychiatric/Behavioral: Negative.     Past Medical History  Diagnosis Date  . Hypertension   . Nipple discharge     with pain, infection, lump  . MRSA (methicillin resistant staph aureus) culture positive     in breast  . Wears dentures   . Breast CA ([place]) 07/01/2010    rt breast ca  . Family history of breast cancer     Aunt on father's side  . Neuropathy ([facility])     bilateral toes  . Hyperlipemia   . Status post chemotherapy     6 cycles of carboplatin and docetaxel with trastuzumab every 21 days followed by surgery  . Use of letrozole ([facility]) 03/09/2011  . S/P radiation therapy 01/08/11 - 02/22/11    Right Chest Wall/ 50 Gy / 25 Fractions, Right [place]/ 50 gy/25 Fractions, Right Axillary Boost/500 cGy/25 Fractions, Right Chest  Wall Boost/10 Gy/5 Fractions  . COPD (chronic obstructive pulmonary disease) ([place])   . Anxiety   . Tobacco abuse 06/22/2015    Past Surgical History  Procedure Laterality Date  . Tubal ligation    . Dilation and curettage of uterus    . Breast surgery  2012  . Port a catheter insertion    .  Port-a-cath removal Left 01/15/2013    Procedure: REMOVAL PORT-A-CATH;  Surgeon: Scherry Ran, MD;  Location: [facility];  Service: General;  Laterality: Left;    Family History  Problem Relation Age of Onset  . COPD Mother   . Diabetes Father   . Hypertension Father   . COPD Brother   . Cancer Brother   . Cancer Maternal Aunt     ovarian - died of old age  . Cancer Paternal Aunt     had breast ca; died of of a different cancer    Social History   Social History  . Marital Status: Married    Spouse Name: N/A  . Number of Children: N/A  . Years of Education: N/A   Social History Main Topics  . Smoking status: Former Smoker -- 1.50 packs/day for 30 years    Types: Cigarettes    Quit date: 02/24/2010  . Smokeless tobacco: Never Used  . Alcohol Use: No  . Drug Use: No  . Sexual Activity: Not Asked   Other Topics Concern  . None   Social History Narrative     PHYSICAL EXAMINATION  ECOG PERFORMANCE STATUS: 0 - Asymptomatic  Filed Vitals:   06/22/15 0940  BP: 136/82  Pulse: 67  Temp: 98.1 F (36.7 C)  Resp: 20    GENERAL:alert, no distress, well nourished, well developed, comfortable, cooperative, ill looking, smiling and unaccompanied SKIN: skin color, texture, turgor are normal, no rashes or significant lesions HEAD: Normocephalic, No masses, lesions, tenderness or abnormalities. Inferior turbinates are edematous with erythema. EYES: normal, EOMI, Conjunctiva are pink and non-injected EARS: External ears normal.  Canals are clear without erythema. TMs are pearly white bilaterally without any effusion, erythema, or abnormalities. OROPHARYNX:lips, buccal mucosa, and tongue normal and mucous membranes are moist, posterior pharynx erythema NECK: supple, no adenopathy, thyroid normal size, non-tender, without nodularity, trachea midline LYMPH:  no palpable lymphadenopathy BREAST:risk and benefit of breast self-exam was discussed, not examined, will be examined at  return appointment. LUNGS: decreased breath sounds, scattered rhonchi bilaterally HEART: regular rate & rhythm, no murmurs and no gallops ABDOMEN:abdomen soft, non-tender and normal bowel sounds BACK: Back symmetric, no curvature. EXTREMITIES:less then 2 second capillary refill, no joint deformities, effusion, or inflammation, no skin discoloration, no cyanosis  NEURO: alert & oriented x 3 with fluent speech, no focal motor/sensory deficits, gait normal   LABORATORY DATA: CBC    Component Value Date/Time   WBC 8.7 06/22/2015 0907   WBC 9.8 12/03/2012 1032   WBC 4.7 11/07/2010 0917   RBC 5.14* 06/22/2015 0907   RBC 4.8 12/03/2012 1032   RBC 3.10* 11/07/2010 0917   HGB 14.9 06/22/2015 0907   HGB 14.4 12/03/2012 1032   HGB 10.1* 11/07/2010 0917   HCT 49.0* 06/22/2015 0907   HCT 44.5 12/03/2012 1032   HCT 31.1* 11/07/2010 0917   PLT 247 06/22/2015 0907   PLT 194 11/07/2010 0917   MCV 95.3 06/22/2015 0907   MCV 91.3 12/03/2012 1032   MCV 100.3 11/07/2010 0917   MCH 29.0 06/22/2015 0907   MCH 29.4 12/03/2012 1032   MCH  32.6 11/07/2010 0917   MCHC 30.4 06/22/2015 0907   MCHC 32.2 12/03/2012 1032   MCHC 32.5 11/07/2010 0917   RDW 15.5 06/22/2015 0907   RDW 18.3* 11/07/2010 0917   LYMPHSABS 1.4 06/22/2015 0907   LYMPHSABS 1.6 11/07/2010 0917   MONOABS 0.7 06/22/2015 0907   MONOABS 1.4* 11/07/2010 0917   EOSABS 0.2 06/22/2015 0907   EOSABS 0.0 11/07/2010 0917   BASOSABS 0.0 06/22/2015 0907   BASOSABS 0.0 11/07/2010 0917      Chemistry      Component Value Date/Time   NA 139 06/22/2015 0907   NA 143 10/09/2014 0847   K 4.1 06/22/2015 0907   CL 100* 06/22/2015 0907   CO2 31 06/22/2015 0907   BUN 12 06/22/2015 0907   BUN 16 10/09/2014 0847   CREATININE 0.57 06/22/2015 0907      Component Value Date/Time   CALCIUM 9.2 06/22/2015 0907   ALKPHOS 79 06/22/2015 0907   AST 22 06/22/2015 0907   ALT 20 06/22/2015 0907   BILITOT 0.3 06/22/2015 0907   BILITOT <0.2  10/09/2014 0847        PENDING LABS:   RADIOGRAPHIC STUDIES:  No results found.   PATHOLOGY:    ASSESSMENT AND PLAN:  Stage III (T4N3) R Br cancer  Stage III C. (T4, N3) right-sided breast cancer. She had apparent axillary as well as intramammary lymph node involvement and suspicious ipsilateral subclavicular lymph node involvement. She took 6 cycles of carboplatin and docetaxel with trastuzumab every 21 days followed by surgery. She then had radiation therapy and finished one full year of trastuzumab (finishing on 07/10/2011). She then started on letrozole 2.5 mg on 03/09/2011.  BCI testing demonstrates low risk of recurrence, but high likelihood of benefit from extended endocrine therapy.  Therefore, she is interested in pursuing anti-endocrine therapy past 5 years.  Labs will be updated today.  Labs in 6 months: CBC diff, CMET  Continue Letrozole daily.   URI today.  Rx for Augmentin escribed x 7 days.  Rx for right lymphedema sleeve provided to the patient.  Return in 6 months for follow-up, labs, and breast exam.  Osteopenia Osteopenia in the setting of endocrine therapy with aromatase inhibitor.  Her T score has worsened since 2013.  As a result of her osteopenia while on AI therapy, she is a candidate for Prolia which I recommend.  Risks, benefits, alternatives (including bisphosphonate therapy), and side effects (including ONJ and hypocalcemia) discussed in detail.  She is currently taking Ca++ and Vit D.  Due to insurance issues, she declines therapy at this time, but will continue with Ca++ and Vit D.  Tobacco abuse She is back to smoking after quitting.  She restarted in the Spring on 2016.  She is smoking 1/3- 1/4 ppd.  Smoking cessation education is provided.    ORDERS PLACED FOR THIS ENCOUNTER: Orders Placed This Encounter  Procedures  . CBC with Differential  . Comprehensive metabolic panel    MEDICATIONS PRESCRIBED THIS ENCOUNTER: Meds ordered this  encounter  Medications  . amoxicillin-clavulanate (AUGMENTIN) 875-125 MG tablet    Sig: Take 1 tablet by mouth 2 (two) times daily.    Dispense:  14 tablet    Refill:  0    Order Specific Question:  Supervising Provider    Answer:  Patrici Ranks [id number]    THERAPY PLAN:  NCCN guidelines recommends the following surveillance for invasive breast cancer (2.2016):  A. History and Physical exam 1-4 times per year  as clinically appropriate for 5 years, then annually.  B. Periodic screening for changes in family history and referral to genetics counseling as indicated  C. Educate, monitor, and refer to lymphedema management.  D. Mammography every 12 months  E. Routine imaging of reconstructed breast is not indicated.  F. In the absence of clinical signs and symptoms suggestive of recurrent disease, there is no indication for laboratory or imaging studies for metastases screening.  G. Women on Tamoxifen: annual gynecologic assessment every 12 months if uterus is present.  H. Women on aromatase inhibitor or who experience ovarian failure secondary to treatment should have monitoring of bone health with a bone mineral density determination at baseline and periodically thereafter.  I. Assess and encourage adherence to adjuvant endocrine therapy.  J. Evidence suggests that active lifestyle, healthy diet, limited alcohol intake, and achieving and maintaining an ideal body weight (20-25 BMI) may lead to optimal breast cancer outcomes.   All questions were answered. The patient knows to call the clinic with any problems, questions or concerns. We can certainly see the patient much sooner if necessary.  Patient and plan discussed with Dr. Ancil Linsey and she is in agreement with the aforementioned.   This note is electronically signed by: Doy Mince 06/22/2015 10:15 AM

## 2015-06-21 NOTE — Assessment & Plan Note (Addendum)
Stage III C. (T4, N3) right-sided breast cancer. She had apparent axillary as well as intramammary lymph node involvement and suspicious ipsilateral subclavicular lymph node involvement. She took 6 cycles of carboplatin and docetaxel with trastuzumab every 21 days followed by surgery. She then had radiation therapy and finished one full year of trastuzumab (finishing on 07/10/2011). She then started on letrozole 2.5 mg on 03/09/2011.  BCI testing demonstrates low risk of recurrence, but high likelihood of benefit from extended endocrine therapy.  Therefore, she is interested in pursuing anti-endocrine therapy past 5 years.  Labs will be updated today.  Labs in 6 months: CBC diff, CMET  Continue Letrozole daily.   URI today.  Rx for Augmentin escribed x 7 days.  Rx for right lymphedema sleeve provided to the patient.  Return in 6 months for follow-up, labs, and breast exam.

## 2015-06-22 ENCOUNTER — Encounter (HOSPITAL_COMMUNITY): Payer: Self-pay | Admitting: Oncology

## 2015-06-22 ENCOUNTER — Encounter (HOSPITAL_COMMUNITY): Payer: Self-pay | Admitting: *Deleted

## 2015-06-22 ENCOUNTER — Encounter (HOSPITAL_COMMUNITY): Payer: No Typology Code available for payment source

## 2015-06-22 ENCOUNTER — Encounter (HOSPITAL_COMMUNITY): Payer: No Typology Code available for payment source | Attending: Oncology | Admitting: Oncology

## 2015-06-22 VITALS — BP 136/82 | HR 67 | Temp 98.1°F | Resp 20 | Wt 156.0 lb

## 2015-06-22 DIAGNOSIS — J069 Acute upper respiratory infection, unspecified: Secondary | ICD-10-CM

## 2015-06-22 DIAGNOSIS — C50111 Malignant neoplasm of central portion of right female breast: Secondary | ICD-10-CM

## 2015-06-22 DIAGNOSIS — Z923 Personal history of irradiation: Secondary | ICD-10-CM | POA: Diagnosis not present

## 2015-06-22 DIAGNOSIS — J449 Chronic obstructive pulmonary disease, unspecified: Secondary | ICD-10-CM | POA: Insufficient documentation

## 2015-06-22 DIAGNOSIS — Z9221 Personal history of antineoplastic chemotherapy: Secondary | ICD-10-CM | POA: Insufficient documentation

## 2015-06-22 DIAGNOSIS — I10 Essential (primary) hypertension: Secondary | ICD-10-CM | POA: Insufficient documentation

## 2015-06-22 DIAGNOSIS — Z833 Family history of diabetes mellitus: Secondary | ICD-10-CM | POA: Insufficient documentation

## 2015-06-22 DIAGNOSIS — E785 Hyperlipidemia, unspecified: Secondary | ICD-10-CM | POA: Diagnosis not present

## 2015-06-22 DIAGNOSIS — C50911 Malignant neoplasm of unspecified site of right female breast: Secondary | ICD-10-CM

## 2015-06-22 DIAGNOSIS — Z720 Tobacco use: Secondary | ICD-10-CM | POA: Diagnosis not present

## 2015-06-22 DIAGNOSIS — M8580 Other specified disorders of bone density and structure, unspecified site: Secondary | ICD-10-CM | POA: Diagnosis not present

## 2015-06-22 DIAGNOSIS — F17210 Nicotine dependence, cigarettes, uncomplicated: Secondary | ICD-10-CM | POA: Insufficient documentation

## 2015-06-22 DIAGNOSIS — Z8041 Family history of malignant neoplasm of ovary: Secondary | ICD-10-CM | POA: Insufficient documentation

## 2015-06-22 DIAGNOSIS — Z803 Family history of malignant neoplasm of breast: Secondary | ICD-10-CM | POA: Diagnosis not present

## 2015-06-22 HISTORY — DX: Tobacco use: Z72.0

## 2015-06-22 LAB — CBC WITH DIFFERENTIAL/PLATELET
Basophils Absolute: 0 K/uL (ref 0.0–0.1)
Basophils Relative: 0 %
Eosinophils Absolute: 0.2 K/uL (ref 0.0–0.7)
Eosinophils Relative: 2 %
HCT: 49 % — ABNORMAL HIGH (ref 36.0–46.0)
Hemoglobin: 14.9 g/dL (ref 12.0–15.0)
Lymphocytes Relative: 16 %
Lymphs Abs: 1.4 K/uL (ref 0.7–4.0)
MCH: 29 pg (ref 26.0–34.0)
MCHC: 30.4 g/dL (ref 30.0–36.0)
MCV: 95.3 fL (ref 78.0–100.0)
Monocytes Absolute: 0.7 K/uL (ref 0.1–1.0)
Monocytes Relative: 9 %
Neutro Abs: 6.3 K/uL (ref 1.7–7.7)
Neutrophils Relative %: 73 %
Platelets: 247 K/uL (ref 150–400)
RBC: 5.14 MIL/uL — ABNORMAL HIGH (ref 3.87–5.11)
RDW: 15.5 % (ref 11.5–15.5)
WBC: 8.7 K/uL (ref 4.0–10.5)

## 2015-06-22 LAB — COMPREHENSIVE METABOLIC PANEL
ALT: 20 U/L (ref 14–54)
ANION GAP: 8 (ref 5–15)
AST: 22 U/L (ref 15–41)
Albumin: 3.8 g/dL (ref 3.5–5.0)
Alkaline Phosphatase: 79 U/L (ref 38–126)
BILIRUBIN TOTAL: 0.3 mg/dL (ref 0.3–1.2)
BUN: 12 mg/dL (ref 6–20)
CALCIUM: 9.2 mg/dL (ref 8.9–10.3)
CO2: 31 mmol/L (ref 22–32)
CREATININE: 0.57 mg/dL (ref 0.44–1.00)
Chloride: 100 mmol/L — ABNORMAL LOW (ref 101–111)
Glucose, Bld: 100 mg/dL — ABNORMAL HIGH (ref 65–99)
Potassium: 4.1 mmol/L (ref 3.5–5.1)
Sodium: 139 mmol/L (ref 135–145)
Total Protein: 6.8 g/dL (ref 6.5–8.1)

## 2015-06-22 MED ORDER — AMOXICILLIN-POT CLAVULANATE 875-125 MG PO TABS: 1.0000 | ORAL_TABLET | Freq: Two times a day (BID) | ORAL | Status: AC

## 2015-06-22 NOTE — Assessment & Plan Note (Signed)
She is back to smoking after quitting.  She restarted in the Spring on 2016.  She is smoking 1/3- 1/4 ppd.  Smoking cessation education is provided.

## 2015-06-22 NOTE — Patient Instructions (Addendum)
[place] at [facility] Discharge Instructions  RECOMMENDATIONS MADE BY THE CONSULTANT AND ANY TEST RESULTS WILL BE SENT TO YOUR REFERRING PHYSICIAN.  Exam and discussion today with Kirby Crigler, PA.  Return in 6 months with labs. Labs same day.   Augmentin escribed to [facility]   Thank you for choosing [place] at [facility] to provide your oncology and hematology care.  To afford each patient quality time with our provider, please arrive at least 15 minutes before your scheduled appointment time.   Beginning January 23rd 2017 lab work for the [facility] will be done in the  Main lab at [facility] on 1st floor. If you have a lab appointment with the [facility] please come in thru the  Main Entrance and check in at the main information desk  You need to re-schedule your appointment should you arrive 10 or more minutes late.  We strive to give you quality time with our providers, and arriving late affects you and other patients whose appointments are after yours.  Also, if you no show three or more times for appointments you may be dismissed from the clinic at the providers discretion.     Again, thank you for choosing [facility].  Our hope is that these requests will decrease the amount of time that you wait before being seen by our physicians.       _____________________________________________________________  Should you have questions after your visit to [facility], please contact our office at [phone] between the hours of 8:30 a.m. and 4:30 p.m.  Voicemails left after 4:30 p.m. will not be returned until the following business day.  For prescription refill requests, have your pharmacy contact our office.         Resources For Cancer Patients and their Caregivers ? American Cancer Society: Can assist with transportation, wigs, general needs, runs Look Good Feel Better.         ([phone] ? Cancer Care: Provides financial assistance, online support groups, medication/co-pay assistance.  1-800-813-HOPE 323-386-6096) ? [place] Assists [place] cancer patients and their families through emotional , educational and financial support.  413-214-6286 ? [place] DSS Where to apply for food stamps, Medicaid and utility assistance. 4307421286 ? [facility]: Transportation to medical appointments. 820-637-1574 ? Social Security Administration: May apply for disability if have a Stage IV cancer. 531-407-2055 (507) 715-7834 ? [employer], Disability and Transit Services: Assists with nutrition, care and transit needs. [place] Support Programs: @10RELATIVEDAYS @ > Cancer Support Group  2nd Tuesday of the month 1pm-2pm, Journey Room  > Creative Journey  3rd Tuesday of the month 1130am-1pm, Journey Room  > Look Good Feel Better  1st Wednesday of the month 10am-12 noon, Journey Room (Call [place] to register [phone])

## 2015-06-23 ENCOUNTER — Telehealth: Payer: Self-pay | Admitting: Family

## 2015-06-24 ENCOUNTER — Other Ambulatory Visit: Payer: Self-pay

## 2015-06-24 DIAGNOSIS — E876 Hypokalemia: Secondary | ICD-10-CM

## 2015-06-24 MED ORDER — POTASSIUM CHLORIDE CRYS ER 20 MEQ PO TBCR: 20.0000 meq | EXTENDED_RELEASE_TABLET | Freq: Two times a day (BID) | ORAL | Status: DC

## 2015-06-24 NOTE — Telephone Encounter (Signed)
Last seen 10/12/14  Sheila Garrison

## 2015-10-03 ENCOUNTER — Other Ambulatory Visit: Payer: Self-pay | Admitting: Family

## 2015-10-03 DIAGNOSIS — E876 Hypokalemia: Secondary | ICD-10-CM

## 2015-10-04 MED ORDER — POTASSIUM CHLORIDE CRYS ER 20 MEQ PO TBCR: 20.0000 meq | EXTENDED_RELEASE_TABLET | Freq: Two times a day (BID) | ORAL | 0 refills | Status: DC

## 2015-10-04 NOTE — Telephone Encounter (Signed)
Detailed  Message left for patient that rx sent to pharmacy.

## 2015-10-07 ENCOUNTER — Encounter: Payer: Self-pay | Admitting: Physician Assistant

## 2015-10-07 ENCOUNTER — Ambulatory Visit (INDEPENDENT_AMBULATORY_CARE_PROVIDER_SITE_OTHER): Payer: PRIVATE HEALTH INSURANCE | Admitting: Physician Assistant

## 2015-10-07 VITALS — BP 116/65 | HR 84 | Temp 97.3°F | Ht 64.0 in | Wt 155.4 lb

## 2015-10-07 DIAGNOSIS — IMO0001 Reserved for inherently not codable concepts without codable children: Secondary | ICD-10-CM

## 2015-10-07 DIAGNOSIS — C50119 Malignant neoplasm of central portion of unspecified female breast: Secondary | ICD-10-CM

## 2015-10-07 DIAGNOSIS — C50111 Malignant neoplasm of central portion of right female breast: Secondary | ICD-10-CM | POA: Diagnosis not present

## 2015-10-07 DIAGNOSIS — E876 Hypokalemia: Secondary | ICD-10-CM

## 2015-10-07 DIAGNOSIS — K219 Gastro-esophageal reflux disease without esophagitis: Secondary | ICD-10-CM | POA: Diagnosis not present

## 2015-10-07 DIAGNOSIS — E785 Hyperlipidemia, unspecified: Secondary | ICD-10-CM

## 2015-10-07 DIAGNOSIS — J449 Chronic obstructive pulmonary disease, unspecified: Secondary | ICD-10-CM

## 2015-10-07 DIAGNOSIS — E559 Vitamin D deficiency, unspecified: Secondary | ICD-10-CM | POA: Diagnosis not present

## 2015-10-07 DIAGNOSIS — I10 Essential (primary) hypertension: Secondary | ICD-10-CM | POA: Diagnosis not present

## 2015-10-07 MED ORDER — ROSUVASTATIN CALCIUM 20 MG PO TABS: 20.0000 mg | ORAL_TABLET | Freq: Every day | ORAL | 3 refills | Status: DC

## 2015-10-07 MED ORDER — LOSARTAN POTASSIUM-HCTZ 100-12.5 MG PO TABS: 1.0000 | ORAL_TABLET | Freq: Every day | ORAL | 3 refills | Status: DC

## 2015-10-07 MED ORDER — POTASSIUM CHLORIDE CRYS ER 20 MEQ PO TBCR: 20.0000 meq | EXTENDED_RELEASE_TABLET | Freq: Two times a day (BID) | ORAL | 3 refills | Status: DC

## 2015-10-07 MED ORDER — CLOBETASOL PROPIONATE 0.05 % EX CREA: 1.0000 "application " | TOPICAL_CREAM | CUTANEOUS | 2 refills | Status: DC | PRN

## 2015-10-07 NOTE — Progress Notes (Signed)
BP 116/65 (BP Location: Left Arm, Patient Position: Sitting, Cuff Size: Normal)   Pulse 84   Temp 97.3 F (36.3 C) (Oral)   Ht _0  (1.626 m)   Wt 155 lb 6.4 oz (70.5 kg)   BMI 26.67 kg/m    Subjective:    Patient ID: Sheila Garrison, female    DOB: 1955-04-29, 60 y.o.   MRN: [id number]  Sheila Garrison is a 60 y.o. female presenting on 10/07/2015 for Medication Refill   HPI this patient comes in for an annual check on her medications and conditions. She is positive for breast cancer from 2012 but is in remission. She still takes hormonal therapy. She still sees her oncologist and radiology oncologist once a year. She is able to work full time and is working take a break at this time.  She still has COPD, well controlled, GERD controlled, hyperlipidemia on medication, history of hypokalemia, vitamin D deficiency, and hypertension. We've performed today as needed. All of her medications reviewed and we will be refilled today as needed. She states that her psoriasis has flared up somewhat and would like to have refill on her clobetasol cream.  Relevant past medical, surgical, family and social history reviewed and updated as indicated. Interim medical history since our last visit reviewed. Allergies and medications reviewed and updated.   Data reviewed from any sources in EPIC.  Review of Systems  Constitutional: Negative.  Negative for activity change, fatigue and fever.  HENT: Positive for rhinorrhea.   Eyes: Negative.   Respiratory: Negative.  Negative for cough and wheezing.   Cardiovascular: Negative.  Negative for chest pain.  Gastrointestinal: Negative.  Negative for abdominal pain.  Endocrine: Negative.   Genitourinary: Negative.  Negative for dysuria.  Musculoskeletal: Negative.   Skin: Positive for rash.  Neurological: Negative.   Psychiatric/Behavioral: The patient is nervous/anxious.     Per HPI unless specifically indicated above  Social History   Social History    . Marital status: Married    Spouse name: N/A  . Number of children: N/A  . Years of education: N/A   Occupational History  . Not on file.   Social History Main Topics  . Smoking status: Former Smoker    Packs/day: 1.50    Years: 30.00    Types: Cigarettes    Quit date: 02/24/2010  . Smokeless tobacco: Never Used  . Alcohol use No  . Drug use: No  . Sexual activity: Not on file   Other Topics Concern  . Not on file   Social History Narrative  . No narrative on file    Past Surgical History:  Procedure Laterality Date  . BREAST SURGERY  2012  . DILATION AND CURETTAGE OF UTERUS    . port a catheter insertion    . PORT-A-CATH REMOVAL Left 01/15/2013   Procedure: REMOVAL PORT-A-CATH;  Surgeon: Scherry Ran, MD;  Location: [facility];  Service: General;  Laterality: Left;  . TUBAL LIGATION      Family History  Problem Relation Age of Onset  . COPD Mother   . Diabetes Father   . Hypertension Father   . COPD Brother   . Cancer Maternal Aunt     ovarian - died of old age  . Cancer Paternal Aunt     had breast ca; died of of a different cancer  . Cancer Brother       Medication List       Accurate as of 10/07/15  8:54 AM. Always use your most recent med list.          budesonide-formoterol 160-4.5 MCG/ACT inhaler Commonly known as:  SYMBICORT Inhale 2 puffs into the lungs 2 (two) times daily.   clobetasol cream 0.05 % Commonly known as:  TEMOVATE Apply 1 application topically as needed. rash   fexofenadine 180 MG tablet Commonly known as:  ALLEGRA Take 180 mg by mouth daily.   ketoconazole 2 % cream Commonly known as:  NIZORAL Apply 1 application topically as needed. rash   letrozole 2.5 MG tablet Commonly known as:  FEMARA Take 1 tablet (2.5 mg total) by mouth daily.   LORazepam 0.5 MG tablet Commonly known as:  ATIVAN Take 1 tablet (0.5 mg total) by mouth every 8 (eight) hours as needed.   losartan-hydrochlorothiazide 100-12.5 MG  tablet Commonly known as:  HYZAAR Take 1 tablet by mouth daily.   multivitamin capsule Take 1 capsule by mouth daily.   naproxen sodium 220 MG tablet Commonly known as:  ANAPROX Take 220 mg by mouth as needed. pain   omeprazole 40 MG capsule Commonly known as:  PRILOSEC Take 1 capsule (40 mg total) by mouth daily.   potassium chloride SA 20 MEQ tablet Commonly known as:  K-DUR,KLOR-CON Take 1 tablet (20 mEq total) by mouth 2 (two) times daily.   rosuvastatin 20 MG tablet Commonly known as:  CRESTOR Take 1 tablet (20 mg total) by mouth daily.   TUDORZA PRESSAIR 400 MCG/ACT Aepb Generic drug:  Aclidinium Bromide Inhale into the lungs.   VITAMIN D-3 PO Take 1 capsule by mouth daily. 400 units a day          Objective:    BP 116/65 (BP Location: Left Arm, Patient Position: Sitting, Cuff Size: Normal)   Pulse 84   Temp 97.3 F (36.3 C) (Oral)   Ht _0  (1.626 m)   Wt 155 lb 6.4 oz (70.5 kg)   BMI 26.67 kg/m   No Known Allergies Wt Readings from Last 3 Encounters:  10/07/15 155 lb 6.4 oz (70.5 kg)  06/22/15 156 lb (70.8 kg)  11/29/14 160 lb 11.2 oz (72.9 kg)    Physical Exam  Constitutional: She is oriented to person, place, and time. She appears well-developed and well-nourished.  HENT:  Head: Normocephalic and atraumatic.  Eyes: Conjunctivae and EOM are normal. Pupils are equal, round, and reactive to light.  Neck: Normal range of motion. Neck supple.  Cardiovascular: Normal rate, regular rhythm, normal heart sounds and intact distal pulses.   Pulmonary/Chest: Effort normal and breath sounds normal.  Abdominal: Soft. Bowel sounds are normal.  Neurological: She is alert and oriented to person, place, and time. She has normal reflexes.  Skin: Skin is warm and dry. Rash noted.  Psychiatric: She has a normal mood and affect. Her behavior is normal. Judgment and thought content normal.    Results for orders placed or performed in visit on 06/22/15  CBC with  Differential  Result Value Ref Range   WBC 8.7 4.0 - 10.5 K/uL   RBC 5.14 (H) 3.87 - 5.11 MIL/uL   Hemoglobin 14.9 12.0 - 15.0 g/dL   HCT 49.0 (H) 36.0 - 46.0 %   MCV 95.3 78.0 - 100.0 fL   MCH 29.0 26.0 - 34.0 pg   MCHC 30.4 30.0 - 36.0 g/dL   RDW 15.5 11.5 - 15.5 %   Platelets 247 150 - 400 K/uL   Neutrophils Relative % 73 %   Neutro Abs  6.3 1.7 - 7.7 K/uL   Lymphocytes Relative 16 %   Lymphs Abs 1.4 0.7 - 4.0 K/uL   Monocytes Relative 9 %   Monocytes Absolute 0.7 0.1 - 1.0 K/uL   Eosinophils Relative 2 %   Eosinophils Absolute 0.2 0.0 - 0.7 K/uL   Basophils Relative 0 %   Basophils Absolute 0.0 0.0 - 0.1 K/uL  Comprehensive metabolic panel  Result Value Ref Range   Sodium 139 135 - 145 mmol/L   Potassium 4.1 3.5 - 5.1 mmol/L   Chloride 100 (L) 101 - 111 mmol/L   CO2 31 22 - 32 mmol/L   Glucose, Bld 100 (H) 65 - 99 mg/dL   BUN 12 6 - 20 mg/dL   Creatinine, Ser 0.57 0.44 - 1.00 mg/dL   Calcium 9.2 8.9 - 10.3 mg/dL   Total Protein 6.8 6.5 - 8.1 g/dL   Albumin 3.8 3.5 - 5.0 g/dL   AST 22 15 - 41 U/L   ALT 20 14 - 54 U/L   Alkaline Phosphatase 79 38 - 126 U/L   Total Bilirubin 0.3 0.3 - 1.2 mg/dL   GFR calc non Af Amer >60 >60 mL/min   GFR calc Af Amer >60 >60 mL/min   Anion gap 8 5 - 15      Assessment & Plan:   1. Essential hypertension Low salt - losartan-hydrochlorothiazide (HYZAAR) 100-12.5 MG tablet; Take 1 tablet by mouth daily.  Dispense: 90 tablet; Refill: 3 - CMP14+EGFR - CBC with Differential/Platelet  2. Chronic obstructive pulmonary disease, unspecified COPD type ([place]) Continue symbicort and tudorza  3. Gastroesophageal reflux disease, esophagitis presence not specified Continue OTC omeprazole  4. Malignant neoplasm of central portion of right female breast [facility]) Sees oncologist and radiology oncology on an annual basis now. Cancer was on 2012  5. Hyperlipidemia - rosuvastatin (CRESTOR) 20 MG tablet; Take 1 tablet (20 mg total) by mouth daily.   Dispense: 90 tablet; Refill: 3 - Lipid panel  6. Malignant neoplasm of central portion of female breast, unspecified laterality ([place]) Continue oncology and radiology - clobetasol cream (TEMOVATE) 0.05 %; Apply 1 application topically as needed. rash  Dispense: 60 g; Refill: 2  7. Hypokalemia - potassium chloride SA (K-DUR,KLOR-CON) 20 MEQ tablet; Take 1 tablet (20 mEq total) by mouth 2 (two) times daily.  Dispense: 180 tablet; Refill: 3  8. Well adult Not seen as a well visit but having annual well labs - CMP14+EGFR - Lipid panel - TSH - CBC with Differential/Platelet - VITAMIN D 25 Hydroxy (Vit-D Deficiency, Fractures)  9. Vitamin D deficiency Continue OTC vitamin D - VITAMIN D 25 Hydroxy (Vit-D Deficiency, Fractures)   Continue all other maintenance medications as listed above.  Follow up plan: Return in about 6 months (around 04/05/2016).  Terald Sleeper PA-C [facility] [address]  [place], [place] [address] [phone]   10/07/2015, 8:54 AM

## 2015-10-07 NOTE — Patient Instructions (Signed)
Psoriasis Psoriasis is a long-term (chronic) condition of skin inflammation. It occurs because your immune system causes skin cells to form too quickly. As a result, too many skin cells grow and create raised, red patches (plaques) that look silvery on your skin. Plaques may appear anywhere on your body. They can be any size or shape. Psoriasis can come and go. The condition varies from mild to very severe. It cannot be passed from one person to another (not contagious).  CAUSES  The cause of psoriasis is not known, but certain factors can make the condition worse. These include:   Damage or trauma to the skin, such as cuts, scrapes, sunburn, and dryness.  Lack of sunlight.  Certain medicines.  Alcohol.  Tobacco use.  Stress.  Infections caused by bacteria or viruses. RISK FACTORS This condition is more likely to develop in:  People with a family history of psoriasis.  People who are Caucasian.  People who are between the ages of 15-30 and 50-60 years old. SYMPTOMS  There are five different types of psoriasis. You can have more than one type of psoriasis during your life. Types are:   Plaque.  Guttate.  Inverse.  Pustular.  Erythrodermic. Each type of psoriasis has different symptoms.   Plaque psoriasis symptoms include red, raised plaques with a silvery white coating (scale). These plaques may be itchy. Your nails may be pitted and crumbly or fall off.  Guttate psoriasis symptoms include small red spots that often show up on your trunk, arms, and legs. These spots may develop after you have been sick, especially with strep throat.  Inverse psoriasis symptoms include plaques in your underarm area, under your breasts, or on your genitals, groin, or buttocks.  Pustular psoriasis symptoms include pus-filled bumps that are painful, red, and swollen on the palms of your hands or the soles of your feet. You also may feel exhausted, feverish, weak, or have no  appetite.  Erythrodermic psoriasis symptoms include bright red skin that may look burned. You may have a fast heartbeat and a body temperature that is too high or too low. You may be itchy or in pain. DIAGNOSIS  Your health care provider may suspect psoriasis based on your symptoms and family history. Your health care provider will also do a physical exam. This may include a procedure to remove a tissue sample (biopsy) for testing. You may also be referred to a health care provider who specializes in skin diseases (dermatologist).  TREATMENT There is no cure for this condition, but treatment can help manage it. Goals of treatment include:   Helping your skin heal.  Reducing itching and inflammation.  Slowing the growth of new skin cells.  Helping your immune system respond better to your skin. Treatment varies, depending on the severity of your condition. Treatment may include:   Creams or ointments.  Ultraviolet ray exposure (light therapy). This may include natural sunlight or light therapy in a medical office.  Medicines (systemic therapy). These medicines can help your body better manage skin cell turnover and inflammation. They may be used along with light therapy or ointments. You may also get antibiotic medicines if you have an infection. HOME CARE INSTRUCTIONS Skin Care  Moisturize your skin as needed. Only use moisturizers that have been approved by your health care provider.   Apply cool compresses to the affected areas.   Do not scratch your skin.  Lifestyle  Do not use tobacco products. This includes cigarettes, chewing tobacco, and e-cigarettes. If you   need help quitting, ask your health care provider.  Drink little or no alcohol.   Try techniques for stress reduction, such as meditation or yoga.  Get exposure to the sun as told by your health care provider. Do not get sunburned.   Consider joining a psoriasis support group.  Medicines  Take or use  over-the-counter and prescription medicines only as told by your health care provider.  If you were prescribed an antibiotic, take or use it as told by your health care provider. Do not stop taking the antibiotic even if your condition starts to improve. General Instructions  Keep a journal to help track what triggers an outbreak. Try to avoid any triggers.   See a counselor or social worker if feelings of sadness, frustration, and hopelessness about your condition are interfering with your work and relationships.  Keep all follow-up visits as told by your health care provider. This is important. SEEK MEDICAL CARE IF:  Your pain gets worse.  You have increasing redness or warmth in the affected areas.   You have new or worsening pain or stiffness in your joints.  Your nails start to break easily or pull away from the nail bed.   You have a fever.   You feel depressed.   This information is not intended to replace advice given to you by your health care provider. Make sure you discuss any questions you have with your health care provider.   Document Released: 01/20/2000 Document Revised: 10/13/2014 Document Reviewed: 06/09/2014 Elsevier Interactive Patient Education 2016 [employer]  

## 2015-10-08 LAB — CBC WITH DIFFERENTIAL/PLATELET
BASOS: 0 %
Basophils Absolute: 0 10*3/uL (ref 0.0–0.2)
EOS (ABSOLUTE): 0.2 10*3/uL (ref 0.0–0.4)
EOS: 2 %
HEMATOCRIT: 48.3 % — AB (ref 34.0–46.6)
HEMOGLOBIN: 15.7 g/dL (ref 11.1–15.9)
Immature Grans (Abs): 0.1 10*3/uL (ref 0.0–0.1)
Immature Granulocytes: 1 %
LYMPHS ABS: 1.5 10*3/uL (ref 0.7–3.1)
Lymphs: 16 %
MCH: 29.7 pg (ref 26.6–33.0)
MCHC: 32.5 g/dL (ref 31.5–35.7)
MCV: 92 fL (ref 79–97)
MONOCYTES: 7 %
Monocytes Absolute: 0.6 10*3/uL (ref 0.1–0.9)
NEUTROS ABS: 7 10*3/uL (ref 1.4–7.0)
Neutrophils: 74 %
Platelets: 245 10*3/uL (ref 150–379)
RBC: 5.28 x10E6/uL (ref 3.77–5.28)
RDW: 15.5 % — ABNORMAL HIGH (ref 12.3–15.4)
WBC: 9.4 10*3/uL (ref 3.4–10.8)

## 2015-10-08 LAB — CMP14+EGFR
A/G RATIO: 1.9 (ref 1.2–2.2)
ALBUMIN: 4.6 g/dL (ref 3.6–4.8)
ALK PHOS: 105 IU/L (ref 39–117)
ALT: 13 IU/L (ref 0–32)
AST: 26 IU/L (ref 0–40)
BILIRUBIN TOTAL: 0.2 mg/dL (ref 0.0–1.2)
BUN / CREAT RATIO: 17 (ref 12–28)
BUN: 12 mg/dL (ref 8–27)
CHLORIDE: 98 mmol/L (ref 96–106)
CO2: 24 mmol/L (ref 18–29)
Calcium: 9.9 mg/dL (ref 8.7–10.3)
Creatinine, Ser: 0.72 mg/dL (ref 0.57–1.00)
GFR calc non Af Amer: 91 mL/min/{1.73_m2} (ref >59)
GFR, EST AFRICAN AMERICAN: 105 mL/min/{1.73_m2} (ref >59)
GLOBULIN, TOTAL: 2.4 g/dL (ref 1.5–4.5)
Glucose: 88 mg/dL (ref 65–99)
Potassium: 4.7 mmol/L (ref 3.5–5.2)
SODIUM: 143 mmol/L (ref 134–144)
TOTAL PROTEIN: 7 g/dL (ref 6.0–8.5)

## 2015-10-08 LAB — LIPID PANEL
Chol/HDL Ratio: 2.3 ratio units (ref 0.0–4.4)
Cholesterol, Total: 131 mg/dL (ref 100–199)
HDL: 57 mg/dL (ref >39)
LDL Calculated: 47 mg/dL (ref 0–99)
Triglycerides: 133 mg/dL (ref 0–149)
VLDL CHOLESTEROL CAL: 27 mg/dL (ref 5–40)

## 2015-10-08 LAB — TSH: TSH: 0.886 u[IU]/mL (ref 0.450–4.500)

## 2015-10-08 LAB — VITAMIN D 25 HYDROXY (VIT D DEFICIENCY, FRACTURES): Vit D, 25-Hydroxy: 34.5 ng/mL (ref 30.0–100.0)

## 2015-10-08 LAB — PLEASE NOTE

## 2015-11-09 ENCOUNTER — Other Ambulatory Visit: Payer: Self-pay | Admitting: Radiation Oncology

## 2015-11-09 DIAGNOSIS — N6320 Unspecified lump in the left breast, unspecified quadrant: Principal | ICD-10-CM

## 2015-11-09 DIAGNOSIS — N6325 Unspecified lump in the left breast, overlapping quadrants: Secondary | ICD-10-CM

## 2015-11-11 ENCOUNTER — Telehealth: Payer: Self-pay | Admitting: *Deleted

## 2015-11-11 NOTE — Telephone Encounter (Signed)
Called patient to inform of [place] on 11/15/15 @ 4 pm @ [facility], lvm for a return call

## 2015-11-15 ENCOUNTER — Ambulatory Visit (HOSPITAL_COMMUNITY): Payer: No Typology Code available for payment source

## 2015-11-15 ENCOUNTER — Encounter (HOSPITAL_COMMUNITY): Payer: No Typology Code available for payment source

## 2015-11-16 ENCOUNTER — Other Ambulatory Visit: Payer: Self-pay | Admitting: Family

## 2015-11-16 DIAGNOSIS — F411 Generalized anxiety disorder: Secondary | ICD-10-CM

## 2015-11-20 ENCOUNTER — Other Ambulatory Visit: Payer: Self-pay | Admitting: Family

## 2015-11-20 DIAGNOSIS — F411 Generalized anxiety disorder: Secondary | ICD-10-CM

## 2015-11-22 ENCOUNTER — Ambulatory Visit (HOSPITAL_COMMUNITY)
Admission: RE | Admit: 2015-11-22 | Discharge: 2015-11-22 | Disposition: A | Discharge status: Home / Self Care | Payer: No Typology Code available for payment source | Source: Ambulatory Visit | Attending: Radiation Oncology | Admitting: Radiation Oncology

## 2015-11-22 ENCOUNTER — Telehealth: Payer: Self-pay | Admitting: Family

## 2015-11-22 ENCOUNTER — Other Ambulatory Visit: Payer: Self-pay | Admitting: Radiation Oncology

## 2015-11-22 ENCOUNTER — Encounter (HOSPITAL_COMMUNITY): Payer: Self-pay | Admitting: Radiology

## 2015-11-22 DIAGNOSIS — N6320 Unspecified lump in the left breast, unspecified quadrant: Secondary | ICD-10-CM

## 2015-11-22 DIAGNOSIS — N6325 Unspecified lump in the left breast, overlapping quadrants: Secondary | ICD-10-CM

## 2015-11-22 DIAGNOSIS — Z853 Personal history of malignant neoplasm of breast: Secondary | ICD-10-CM | POA: Diagnosis not present

## 2015-11-22 DIAGNOSIS — R590 Localized enlarged lymph nodes: Secondary | ICD-10-CM | POA: Insufficient documentation

## 2015-11-22 DIAGNOSIS — R599 Enlarged lymph nodes, unspecified: Secondary | ICD-10-CM

## 2015-11-29 ENCOUNTER — Encounter (HOSPITAL_COMMUNITY): Payer: Self-pay

## 2015-11-29 ENCOUNTER — Inpatient Hospital Stay (HOSPITAL_COMMUNITY): Admission: RE | Admit: 2015-11-29 | Payer: No Typology Code available for payment source | Source: Ambulatory Visit

## 2015-11-29 ENCOUNTER — Other Ambulatory Visit: Payer: Self-pay | Admitting: Radiation Oncology

## 2015-11-29 ENCOUNTER — Ambulatory Visit (HOSPITAL_COMMUNITY)
Admission: RE | Admit: 2015-11-29 | Discharge: 2015-11-29 | Disposition: A | Discharge status: Home / Self Care | Payer: PRIVATE HEALTH INSURANCE | Source: Ambulatory Visit | Attending: Radiation Oncology | Admitting: Radiation Oncology

## 2015-11-29 DIAGNOSIS — C50412 Malignant neoplasm of upper-outer quadrant of left female breast: Secondary | ICD-10-CM | POA: Insufficient documentation

## 2015-11-29 DIAGNOSIS — N6320 Unspecified lump in the left breast, unspecified quadrant: Secondary | ICD-10-CM

## 2015-11-29 DIAGNOSIS — C50812 Malignant neoplasm of overlapping sites of left female breast: Secondary | ICD-10-CM | POA: Insufficient documentation

## 2015-11-29 DIAGNOSIS — C773 Secondary and unspecified malignant neoplasm of axilla and upper limb lymph nodes: Secondary | ICD-10-CM | POA: Insufficient documentation

## 2015-11-29 DIAGNOSIS — R599 Enlarged lymph nodes, unspecified: Secondary | ICD-10-CM

## 2015-11-29 MED ORDER — LIDOCAINE-EPINEPHRINE (PF) 1 %-1:200000 IJ SOLN
INTRAMUSCULAR | Status: AC
  Filled 2015-11-29: qty 30

## 2015-11-29 MED ORDER — LIDOCAINE HCL (PF) 1 % IJ SOLN
INTRAMUSCULAR | Status: AC
  Filled 2015-11-29: qty 15

## 2015-12-08 NOTE — H&P (Signed)
  NTS SOAP Note  Vital Signs:  Vitals as of: 123456: Systolic 0000000: Diastolic 77: Heart Rate 90: Temp 97.45F (Temporal): Height 74ft 4in: Weight 154Lbs 0 Ounces: BMI 26.43   BMI : 26.43 kg/m2  Subjective: This 60 year old female presents for of left breast cancer.  Patient found a lump in her left breast.  Biopsy shows invasive cancer, mets to one lymph node.  s/p right modified radical mastectomy in 2012 for Stage 3 carcinoma.  No family h/o breast carcinoma.  Did receive XRT, chemotherapy after surgery.  Referred by Josie Saunders, Dr. Whitney Muse.  Denies any bone pain.  Review of Symptoms:  Constitutional:negative Head:negative Eyes:negative Nose/Mouth/Throat:negative Cardiovascular:negative Respiratory:dyspnea Gastrointestinnegative Genitourinary:negative Musculoskeletal:negative psoriasis as above Hematolgic/Lymphatic:negative Allergic/Immunologic:negative   Past Medical History:Reviewed  Past Medical History  Surgical History: Right modified radical mastectomy 2012, port removal 2014, BTL Medical Problems: Stage 3 right breast cancer, HTN, high cholesterol, COPD Allergies: nkda Medications: rosurvastatin, losartan, klor-con, letrozole, ompeprazole, allegra, symbicort, tudoza, lorazepam   Social History:Reviewed  Social History  Preferred Language: English Race:  White Ethnicity: Not Hispanic / Latino Age: 59 year Marital Status:  M Alcohol: no   Smoking Status: Current every day smoker reviewed on 12/08/2015 Started Date:  Packs per week:  Functional Status reviewed on 12/08/2015 ------------------------------------------------ Bathing: Normal Cooking: Normal Dressing: Normal Driving: Normal Eating: Normal Managing Meds: Normal Oral Care: Normal Shopping: Normal Toileting: Normal Transferring: Normal Walking: Normal Cognitive Status reviewed on 12/08/2015 ------------------------------------------------ Attention:  Normal Decision Making: Normal Language: Normal Memory: Normal Motor: Normal Perception: Normal Problem Solving: Normal Visual and Spatial: Normal   Family History:Reviewed  Family Health History Mother, Living; Pulmonary emphysema;  Father, Living; Hypertension (high blood pressure); Diabetes mellitus, unspecified type;     Objective Information: General:Well appearing, well nourished in no distress. Skin:no rash or prominent lesions Head:Atraumatic; no masses; no abnormalities Neck:Supple without lymphadenopathy.  Heart:RRR, no murmur or gallop.  Normal S1, S2.  No S3, S4.  Lungs:CTA bilaterally, no wheezes, rhonchi, rales.  Breathing unlabored. Dominant mass noted centrally in left breast with some bruising, palpable left axillary mass noted.  s/p right mastectomy.  No lumps noted. Abdomen:Soft, NT/ND, no HSM, no masses. No supraclavicular adenopathy noted.  No cervical lymphadenopathy [facility] notes reviewed.  Radiology and path notes reviewed Assessment:Left breast cancer, h/o right breast cancer  Diagnoses: 174.1  C50.112 Primary malignant neoplasm of central portion of female breast (Malignant neoplasm of central portion of left female breast)  Procedures: [phone] - OFFICE OUTPATIENT NEW 30 MINUTES    Plan:  Scheduled for left modified radical mastectomy on 12/16/15.  Oncology aware of plan.   Patient Education:Alternative treatments to surgery were discussed with patient (and family).Risks and benefits  of procedure including bleeding, infection, arm swelling, nerve pain, and the possible need for blood transfusion were fully explained to the patient (and family) who gave informed consent. Patient/family questions were addressed.  Follow-up:Pending Surgery

## 2015-12-12 NOTE — Patient Instructions (Signed)
Sheila Garrison  12/12/2015     @PREFPERIOPPHARMACY @   Your procedure is scheduled on  12/16/2015   Report to [facility] at  615  A.M.  Call this number if you have problems the morning of surgery:  [phone]   Remember:  Do not eat food or drink liquids after midnight.  Take these medicines the morning of surgery with A SIP OF WATER  Allegra, femara, ativan, losartan, prilosec.   Do not wear jewelry, make-up or nail polish.  Do not wear lotions, powders, or perfumes, or deoderant.  Do not shave 48 hours prior to surgery.  Men may shave face and neck.  Do not bring valuables to the hospital.  [facility] is not responsible for any belongings or valuables.  Contacts, dentures or bridgework may not be worn into surgery.  Leave your suitcase in the car.  After surgery it may be brought to your room.  For patients admitted to the hospital, discharge time will be determined by your treatment team.  Patients discharged the day of surgery will not be allowed to drive home.   Name and phone number of your driver:   family Special instructions:  none  Please read over the following fact sheets that you were given. Anesthesia Post-op Instructions and Care and Recovery After Surgery      Total or Modified Radical Mastectomy A total mastectomy and a modified radical mastectomy are types of surgery for breast cancer. If you are having a total mastectomy (simple mastectomy), your entire breast will be removed. If you are having a modified radical mastectomy, your breast and nipple will be removed along with the lymph nodes under your arm. You may also have some of the lining over the muscle tissues under your breast removed. LET [facility] CARE PROVIDER KNOW ABOUT:  Any allergies you have.  All medicines you are taking, including vitamins, herbs, eye drops, creams, and over-the-counter medicines.  Previous problems you or members of your family have had with  the use of anesthetics.  Any blood disorders you have.  Previous surgeries you have had.  Medical conditions you have. RISKS AND COMPLICATIONS Generally, this is a safe procedure. However, problems may occur, including:  Pain.  Infection.  Bleeding.  Scar tissue.  Chest numbness on the side of the surgery.  Fluid buildup under the skin flaps where your breast was removed (seroma).  Sensation of throbbing or tingling.  Stress or sadness from losing your breast. If you have the lymph nodes under your arm removed, you may have arm swelling, weakness, or numbness on the same side of your body as your surgery. BEFORE THE PROCEDURE  Ask your health care provider about:  Changing or stopping your regular medicines. This is especially important if you are taking diabetes medicines or blood thinners.  Taking medicines such as aspirin and ibuprofen. These medicines can thin your blood. Do not take these medicines before your procedure if your health care provider instructs you not to.  Follow your health care provider's instructions about eating or drinking restrictions.  Plan to have someone take you home after the procedure. PROCEDURE  An IV tube will be inserted into one of your veins.  You will be given a medicine that makes you fall asleep (general anesthetic).  Your breast will be cleaned with a germ-killing solution (antiseptic).  A wide incision will be made around your nipple. The skin and  nipple inside the incision will be removed along with all breast tissue.  If you are having a modified radical mastectomy:  The lining over your chest muscles will be removed.  The incision may be extended to reach the lymph nodes under your arm, or a second incision may be made.  The lymph nodes will be removed.  You may have a drainage tube inserted into your incision to collect fluid that builds up after surgery. This tube is connected to a suction bulb.  Your incision or  incisions will be closed with stitches (sutures).  A bandage (dressing) will be placed over your breast and under your arm. The procedure may vary among health care providers and hospitals. AFTER THE PROCEDURE  You will be moved to a recovery area.  Your blood pressure, heart rate, breathing rate, and blood oxygen level will be monitored often until the medicines you were given have worn off.  You will be given pain medicine as needed.  After a while, you will be taken to a hospital room.  You will be encouraged to get up and walk as soon as you can.  Your IV tube can be removed when you are able to eat and drink.  Your drain may be removed before you go home from the hospital, or you may be sent home with your drain and suction bulb.   This information is not intended to replace advice given to you by your health care provider. Make sure you discuss any questions you have with your health care provider.   Document Released: 10/17/2000 Document Revised: 02/12/2014 Document Reviewed: 10/07/2013 Elsevier Interactive Patient Education 2016 [place]. Total or Modified Radical Mastectomy, Care After Refer to this sheet in the next few weeks. These instructions provide you with information about caring for yourself after your procedure. Your health care provider may also give you more specific instructions. Your treatment has been planned according to current medical practices, but problems sometimes occur. Call your health care provider if you have any problems or questions after your procedure. WHAT TO EXPECT AFTER THE PROCEDURE After your procedure, it is common to have:  Pain.  Numbness.  Stiffness in your arm or shoulder.  Feelings of stress, sadness, or depression. If the lymph nodes under your arm were removed, you may have arm swelling, weakness, or numbness on the same side of your body as your surgery. HOME CARE INSTRUCTIONS Incision Care  There are many different  ways to close and cover an incision, including stitches, skin glue, and adhesive strips. Follow your health care provider's instructions about:  Incision care.  Bandage (dressing) changes and removal.  Incision closure removal.  Check your incision area every day for signs of infection. Watch for:  Redness, swelling, or pain.  Fluid, blood, or pus.  If you were sent home with a surgical drain in place, follow your health care provider's instructions for emptying it. Bathing  Do not take baths, swim, or use a hot tub until your health care provider approves.  Take sponge baths until your health care provider says that you can start showering or bathing. Activities  Return to your normal activities as directed by your health care provider.  Avoid strenuous exercise.  Be careful to avoid any activities that could cause an injury to your arm on the side of your surgery.  Do not lift anything that is heavier than 10 lb (4.5 kg). Avoid lifting with the arm that is on the side of your  surgery.  Do not carry heavy objects on your shoulder.  After your drain is removed, you should perform exercises to keep your arm from getting stiff and swollen. Talk with your health care provider about which exercises are safe for you. General Instructions  Take medicines only as directed by your health care provider.  You may eat what you usually do.  Keep your arm elevated when at rest.  Do not wear tight jewelry on your arm, wrist, or fingers on the side of your surgery.  If you had a modified radical mastectomy, always let your health care providers know that lymph nodes under your arm were removed. This is important information to share before you are involved in certain procedures, such as giving blood or having your blood pressure taken. SEEK MEDICAL CARE IF:  You have a fever.  Your pain medicine is not working.  Your arm swelling, weakness, or numbness has not improved after a few  weeks.  You have new swelling in your breast or arm.  You have redness, swelling, or pain in your incision area.  You have fluid, blood, or pus coming from your incision. SEEK IMMEDIATE MEDICAL CARE IF:  You have very bad pain in your breast or arm.  You have chest pain.  You have difficulty breathing.   This information is not intended to replace advice given to you by your health care provider. Make sure you discuss any questions you have with your health care provider.   Document Released: 09/15/2003 Document Revised: 02/12/2014 Document Reviewed: 10/07/2013 Elsevier Interactive Patient Education 2016 [place] Anesthesia, Adult General anesthesia is a sleep-like state of non-feeling produced by medicines (anesthetics). General anesthesia prevents you from being alert and feeling pain during a medical procedure. Your caregiver may recommend general anesthesia if your procedure:  Is long.  Is painful or uncomfortable.  Would be frightening to see or hear.  Requires you to be still.  Affects your breathing.  Causes significant blood loss. LET YOUR CAREGIVER KNOW ABOUT:  Allergies to food or medicine.  Medicines taken, including vitamins, herbs, eyedrops, over-the-counter medicines, and creams.  Use of steroids (by mouth or creams).  Previous problems with anesthetics or numbing medicines, including problems experienced by relatives.  History of bleeding problems or blood clots.  Previous surgeries and types of anesthetics received.  Possibility of pregnancy, if this applies.  Use of cigarettes, alcohol, or illegal drugs.  Any health condition(s), especially diabetes, sleep apnea, and high blood pressure. RISKS AND COMPLICATIONS General anesthesia rarely causes complications. However, if complications do occur, they can be life threatening. Complications include:  A lung infection.  A stroke.  A heart attack.  Waking up during the procedure.  When this occurs, the patient may be unable to move and communicate that he or she is awake. The patient may feel severe pain. Older adults and adults with serious medical problems are more likely to have complications than adults who are young and healthy. Some complications can be prevented by answering all of your caregiver's questions thoroughly and by following all pre-procedure instructions. It is important to tell your caregiver if any of the pre-procedure instructions, especially those related to diet, were not followed. Any food or liquid in the stomach can cause problems when you are under general anesthesia. BEFORE THE PROCEDURE  Ask your caregiver if you will have to spend the night at the hospital. If you will not have to spend the night, arrange to have an adult drive  you and stay with you for 24 hours.  Follow your caregiver's instructions if you are taking dietary supplements or medicines. Your caregiver may tell you to stop taking them or to reduce your dosage.  Do not smoke for as long as possible before your procedure. If possible, stop smoking 3-6 weeks before the procedure.  Do not take new dietary supplements or medicines within 1 week of your procedure unless your caregiver approves them.  Do not eat within 8 hours of your procedure or as directed by your caregiver. Drink only clear liquids, such as water, black coffee (without milk or cream), and fruit juices (without pulp).  Do not drink within 3 hours of your procedure or as directed by your caregiver.  You may brush your teeth on the morning of the procedure, but make sure to spit out the toothpaste and water when finished. PROCEDURE  You will receive anesthetics through a mask, through an intravenous (IV) access tube, or through both. A doctor who specializes in anesthesia (anesthesiologist) or a nurse who specializes in anesthesia (nurse anesthetist) or both will stay with you throughout the procedure to make sure you  remain unconscious. He or she will also watch your blood pressure, pulse, and oxygen levels to make sure that the anesthetics do not cause any problems. Once you are asleep, a breathing tube or mask may be used to help you breathe. AFTER THE PROCEDURE You will wake up after the procedure is complete. You may be in the room where the procedure was performed or in a recovery area. You may have a sore throat if a breathing tube was used. You may also feel:  Dizzy.  Weak.  Drowsy.  Confused.  Nauseous.  Cold. These are all normal responses and can be expected to last for up to 24 hours after the procedure is complete. A caregiver will tell you when you are ready to go home. This will usually be when you are fully awake and in stable condition.   This information is not intended to replace advice given to you by your health care provider. Make sure you discuss any questions you have with your health care provider.   Document Released: 05/01/2007 Document Revised: 02/12/2014 Document Reviewed: 05/23/2011 Elsevier Interactive Patient Education 2016 [place] Anesthesia, Adult, Care After Refer to this sheet in the next few weeks. These instructions provide you with information on caring for yourself after your procedure. Your health care provider may also give you more specific instructions. Your treatment has been planned according to current medical practices, but problems sometimes occur. Call your health care provider if you have any problems or questions after your procedure. WHAT TO EXPECT AFTER THE PROCEDURE After the procedure, it is typical to experience:  Sleepiness.  Nausea and vomiting. HOME CARE INSTRUCTIONS  For the first 24 hours after general anesthesia:  Have a responsible person with you.  Do not drive a car. If you are alone, do not take public transportation.  Do not drink alcohol.  Do not take medicine that has not been prescribed by your health care  provider.  Do not sign important papers or make important decisions.  You may resume a normal diet and activities as directed by your health care provider.  Change bandages (dressings) as directed.  If you have questions or problems that seem related to general anesthesia, call the hospital and ask for the anesthetist or anesthesiologist on call. SEEK MEDICAL CARE IF:  You have nausea and vomiting  that continue the day after anesthesia.  You develop a rash. SEEK IMMEDIATE MEDICAL CARE IF:   You have difficulty breathing.  You have chest pain.  You have any allergic problems.   This information is not intended to replace advice given to you by your health care provider. Make sure you discuss any questions you have with your health care provider.   Document Released: 04/30/2000 Document Revised: 02/12/2014 Document Reviewed: 05/23/2011 Elsevier Interactive Patient Education 2016 [employer] PATIENT INSTRUCTIONS POST-ANESTHESIA  IMMEDIATELY FOLLOWING SURGERY:  Do not drive or operate machinery for the first twenty four hours after surgery.  Do not make any important decisions for twenty four hours after surgery or while taking narcotic pain medications or sedatives.  If you develop intractable nausea and vomiting or a severe headache please notify your doctor immediately.  FOLLOW-UP:  Please make an appointment with your surgeon as instructed. You do not need to follow up with anesthesia unless specifically instructed to do so.  WOUND CARE INSTRUCTIONS (if applicable):  Keep a dry clean dressing on the anesthesia/puncture wound site if there is drainage.  Once the wound has quit draining you may leave it open to air.  Generally you should leave the bandage intact for twenty four hours unless there is drainage.  If the epidural site drains for more than 36-48 hours please call the anesthesia department.  QUESTIONS?:  Please feel free to call your physician or the hospital operator  if you have any questions, and they will be happy to assist you.

## 2015-12-13 ENCOUNTER — Ambulatory Visit (HOSPITAL_COMMUNITY): Payer: PRIVATE HEALTH INSURANCE

## 2015-12-13 ENCOUNTER — Encounter (HOSPITAL_COMMUNITY): Payer: Self-pay

## 2015-12-13 ENCOUNTER — Ambulatory Visit (HOSPITAL_COMMUNITY)
Admission: RE | Admit: 2015-12-13 | Discharge: 2015-12-13 | Disposition: A | Discharge status: Home / Self Care | Payer: PRIVATE HEALTH INSURANCE | Source: Ambulatory Visit | Attending: General Surgery | Admitting: General Surgery

## 2015-12-13 ENCOUNTER — Encounter (HOSPITAL_COMMUNITY)
Admission: RE | Admit: 2015-12-13 | Discharge: 2015-12-13 | Disposition: A | Discharge status: Home / Self Care | Payer: PRIVATE HEALTH INSURANCE | Source: Ambulatory Visit | Attending: General Surgery | Admitting: General Surgery

## 2015-12-13 DIAGNOSIS — Z9011 Acquired absence of right breast and nipple: Secondary | ICD-10-CM | POA: Insufficient documentation

## 2015-12-13 DIAGNOSIS — C50919 Malignant neoplasm of unspecified site of unspecified female breast: Secondary | ICD-10-CM | POA: Insufficient documentation

## 2015-12-13 DIAGNOSIS — Z01818 Encounter for other preprocedural examination: Secondary | ICD-10-CM | POA: Insufficient documentation

## 2015-12-13 DIAGNOSIS — Z0183 Encounter for blood typing: Secondary | ICD-10-CM | POA: Insufficient documentation

## 2015-12-13 DIAGNOSIS — J439 Emphysema, unspecified: Secondary | ICD-10-CM | POA: Diagnosis not present

## 2015-12-13 DIAGNOSIS — C773 Secondary and unspecified malignant neoplasm of axilla and upper limb lymph nodes: Secondary | ICD-10-CM | POA: Insufficient documentation

## 2015-12-13 DIAGNOSIS — Z01812 Encounter for preprocedural laboratory examination: Secondary | ICD-10-CM | POA: Diagnosis not present

## 2015-12-13 DIAGNOSIS — R9431 Abnormal electrocardiogram [ECG] [EKG]: Secondary | ICD-10-CM | POA: Diagnosis not present

## 2015-12-13 DIAGNOSIS — C50912 Malignant neoplasm of unspecified site of left female breast: Secondary | ICD-10-CM

## 2015-12-13 LAB — CBC WITH DIFFERENTIAL/PLATELET
BASOS ABS: 0 10*3/uL (ref 0.0–0.1)
BASOS PCT: 0 %
EOS ABS: 0.2 10*3/uL (ref 0.0–0.7)
EOS PCT: 3 %
HCT: 47.4 % — ABNORMAL HIGH (ref 36.0–46.0)
HEMOGLOBIN: 15.8 g/dL — AB (ref 12.0–15.0)
LYMPHS ABS: 1.8 10*3/uL (ref 0.7–4.0)
Lymphocytes Relative: 18 %
MCH: 31.3 pg (ref 26.0–34.0)
MCHC: 33.3 g/dL (ref 30.0–36.0)
MCV: 94 fL (ref 78.0–100.0)
Monocytes Absolute: 1 10*3/uL (ref 0.1–1.0)
Monocytes Relative: 11 %
NEUTROS PCT: 68 %
Neutro Abs: 6.6 10*3/uL (ref 1.7–7.7)
PLATELETS: 235 10*3/uL (ref 150–400)
RBC: 5.04 MIL/uL (ref 3.87–5.11)
RDW: 15.2 % (ref 11.5–15.5)
WBC: 9.7 10*3/uL (ref 4.0–10.5)

## 2015-12-13 LAB — COMPREHENSIVE METABOLIC PANEL
ALBUMIN: 4.1 g/dL (ref 3.5–5.0)
ALK PHOS: 89 U/L (ref 38–126)
ALT: 21 U/L (ref 14–54)
AST: 36 U/L (ref 15–41)
Anion gap: 9 (ref 5–15)
BUN: 14 mg/dL (ref 6–20)
CALCIUM: 9.1 mg/dL (ref 8.9–10.3)
CHLORIDE: 99 mmol/L — AB (ref 101–111)
CO2: 27 mmol/L (ref 22–32)
CREATININE: 0.48 mg/dL (ref 0.44–1.00)
GFR calc Af Amer: >60 mL/min (ref >60)
GFR calc non Af Amer: >60 mL/min (ref >60)
GLUCOSE: 100 mg/dL — AB (ref 65–99)
Potassium: 3.8 mmol/L (ref 3.5–5.1)
SODIUM: 135 mmol/L (ref 135–145)
Total Bilirubin: 0.3 mg/dL (ref 0.3–1.2)
Total Protein: 7.1 g/dL (ref 6.5–8.1)

## 2015-12-13 LAB — PREPARE RBC (CROSSMATCH)

## 2015-12-14 LAB — ABO/RH: ABO/RH(D): A POS

## 2015-12-15 ENCOUNTER — Ambulatory Visit (HOSPITAL_COMMUNITY): Payer: PRIVATE HEALTH INSURANCE | Admitting: Hematology & Oncology

## 2015-12-16 ENCOUNTER — Ambulatory Visit (HOSPITAL_COMMUNITY): Payer: PRIVATE HEALTH INSURANCE | Admitting: Anesthesiology

## 2015-12-16 ENCOUNTER — Encounter (HOSPITAL_COMMUNITY): Payer: Self-pay | Admitting: *Deleted

## 2015-12-16 ENCOUNTER — Encounter (HOSPITAL_COMMUNITY)
Admission: RE | Disposition: A | Discharge status: Home / Self Care | Payer: Self-pay | Source: Ambulatory Visit | Attending: General Surgery

## 2015-12-16 ENCOUNTER — Observation Stay (HOSPITAL_COMMUNITY)
Admission: RE | Admit: 2015-12-16 | Discharge: 2015-12-18 | Disposition: A | Discharge status: Home / Self Care | Payer: PRIVATE HEALTH INSURANCE | Source: Ambulatory Visit | Attending: General Surgery | Admitting: General Surgery

## 2015-12-16 DIAGNOSIS — F17210 Nicotine dependence, cigarettes, uncomplicated: Secondary | ICD-10-CM | POA: Insufficient documentation

## 2015-12-16 DIAGNOSIS — F419 Anxiety disorder, unspecified: Secondary | ICD-10-CM | POA: Insufficient documentation

## 2015-12-16 DIAGNOSIS — K219 Gastro-esophageal reflux disease without esophagitis: Secondary | ICD-10-CM | POA: Insufficient documentation

## 2015-12-16 DIAGNOSIS — C50912 Malignant neoplasm of unspecified site of left female breast: Secondary | ICD-10-CM | POA: Diagnosis present

## 2015-12-16 DIAGNOSIS — C50112 Malignant neoplasm of central portion of left female breast: Principal | ICD-10-CM | POA: Insufficient documentation

## 2015-12-16 DIAGNOSIS — C773 Secondary and unspecified malignant neoplasm of axilla and upper limb lymph nodes: Secondary | ICD-10-CM | POA: Diagnosis not present

## 2015-12-16 DIAGNOSIS — E7800 Pure hypercholesterolemia, unspecified: Secondary | ICD-10-CM | POA: Insufficient documentation

## 2015-12-16 DIAGNOSIS — Z79899 Other long term (current) drug therapy: Secondary | ICD-10-CM | POA: Insufficient documentation

## 2015-12-16 DIAGNOSIS — J449 Chronic obstructive pulmonary disease, unspecified: Secondary | ICD-10-CM | POA: Insufficient documentation

## 2015-12-16 DIAGNOSIS — I10 Essential (primary) hypertension: Secondary | ICD-10-CM | POA: Diagnosis not present

## 2015-12-16 DIAGNOSIS — Z9011 Acquired absence of right breast and nipple: Secondary | ICD-10-CM | POA: Insufficient documentation

## 2015-12-16 HISTORY — PX: MASTECTOMY MODIFIED RADICAL: SHX5962

## 2015-12-16 SURGERY — MASTECTOMY, MODIFIED RADICAL
Anesthesia: General | Site: Breast | Laterality: Left

## 2015-12-16 MED ORDER — LIDOCAINE HCL (PF) 1 % IJ SOLN
INTRAMUSCULAR | Status: AC
  Filled 2015-12-16: qty 5

## 2015-12-16 MED ORDER — VANCOMYCIN HCL IN DEXTROSE 1-5 GM/200ML-% IV SOLN
1000.0000 mg | INTRAVENOUS | Status: AC
  Administered 2015-12-16: 1000 mg via INTRAVENOUS

## 2015-12-16 MED ORDER — EPHEDRINE SULFATE 50 MG/ML IJ SOLN
INTRAMUSCULAR | Status: AC
  Filled 2015-12-16: qty 1

## 2015-12-16 MED ORDER — ROCURONIUM BROMIDE 100 MG/10ML IV SOLN
INTRAVENOUS | Status: DC | PRN
  Administered 2015-12-16: 40 mg via INTRAVENOUS

## 2015-12-16 MED ORDER — LIDOCAINE HCL (CARDIAC) 20 MG/ML IV SOLN
INTRAVENOUS | Status: DC | PRN
  Administered 2015-12-16: 60 mg via INTRATRACHEAL

## 2015-12-16 MED ORDER — ONDANSETRON HCL 4 MG/2ML IJ SOLN
4.0000 mg | Freq: Four times a day (QID) | INTRAMUSCULAR | Status: DC | PRN
  Administered 2015-12-17: 4 mg via INTRAVENOUS
  Filled 2015-12-16: qty 2

## 2015-12-16 MED ORDER — EPHEDRINE SULFATE 50 MG/ML IJ SOLN
INTRAMUSCULAR | Status: DC | PRN
  Administered 2015-12-16: 10 mg via INTRAVENOUS

## 2015-12-16 MED ORDER — CHLORHEXIDINE GLUCONATE CLOTH 2 % EX PADS: 6.0000 | MEDICATED_PAD | Freq: Once | CUTANEOUS | Status: DC

## 2015-12-16 MED ORDER — ONDANSETRON 4 MG PO TBDP: 4.0000 mg | ORAL_TABLET | Freq: Four times a day (QID) | ORAL | Status: DC | PRN

## 2015-12-16 MED ORDER — MOMETASONE FURO-FORMOTEROL FUM 200-5 MCG/ACT IN AERO
2.0000 | INHALATION_SPRAY | Freq: Two times a day (BID) | RESPIRATORY_TRACT | Status: DC
  Administered 2015-12-16 – 2015-12-18 (×3): 2 via RESPIRATORY_TRACT
  Filled 2015-12-16: qty 8.8

## 2015-12-16 MED ORDER — LETROZOLE 2.5 MG PO TABS
2.5000 mg | ORAL_TABLET | Freq: Every day | ORAL | Status: DC
  Administered 2015-12-16 – 2015-12-18 (×3): 2.5 mg via ORAL
  Filled 2015-12-16 (×4): qty 1

## 2015-12-16 MED ORDER — FENTANYL CITRATE (PF) 250 MCG/5ML IJ SOLN
INTRAMUSCULAR | Status: AC
  Filled 2015-12-16: qty 5

## 2015-12-16 MED ORDER — LACTATED RINGERS IV SOLN
INTRAVENOUS | Status: DC
  Administered 2015-12-16 – 2015-12-18 (×2): via INTRAVENOUS

## 2015-12-16 MED ORDER — LOSARTAN POTASSIUM-HCTZ 100-12.5 MG PO TABS: 1.0000 | ORAL_TABLET | Freq: Every day | ORAL | Status: DC

## 2015-12-16 MED ORDER — LORAZEPAM 0.5 MG PO TABS
0.5000 mg | ORAL_TABLET | Freq: Three times a day (TID) | ORAL | Status: DC | PRN
  Administered 2015-12-16: 0.5 mg via ORAL
  Filled 2015-12-16: qty 1

## 2015-12-16 MED ORDER — SIMETHICONE 80 MG PO CHEW: 40.0000 mg | CHEWABLE_TABLET | Freq: Four times a day (QID) | ORAL | Status: DC | PRN

## 2015-12-16 MED ORDER — PHENYLEPHRINE HCL 10 MG/ML IJ SOLN
INTRAMUSCULAR | Status: DC | PRN
  Administered 2015-12-16: 120 ug via INTRAVENOUS
  Administered 2015-12-16: 40 ug via INTRAVENOUS
  Administered 2015-12-16: 80 ug via INTRAVENOUS

## 2015-12-16 MED ORDER — DIPHENHYDRAMINE HCL 50 MG/ML IJ SOLN: 12.5000 mg | Freq: Four times a day (QID) | INTRAMUSCULAR | Status: DC | PRN

## 2015-12-16 MED ORDER — NAPROXEN 250 MG PO TABS
500.0000 mg | ORAL_TABLET | Freq: Two times a day (BID) | ORAL | Status: DC | PRN
Start: 2015-12-16 — End: 2015-12-18
  Administered 2015-12-18: 500 mg via ORAL
  Filled 2015-12-16: qty 2

## 2015-12-16 MED ORDER — LOSARTAN POTASSIUM 50 MG PO TABS
100.0000 mg | ORAL_TABLET | Freq: Every day | ORAL | Status: DC
  Administered 2015-12-17 – 2015-12-18 (×2): 100 mg via ORAL
  Filled 2015-12-16 (×2): qty 2

## 2015-12-16 MED ORDER — ENOXAPARIN SODIUM 40 MG/0.4ML ~~LOC~~ SOLN
40.0000 mg | SUBCUTANEOUS | Status: DC
  Administered 2015-12-17 – 2015-12-18 (×2): 40 mg via SUBCUTANEOUS
  Filled 2015-12-16 (×2): qty 0.4

## 2015-12-16 MED ORDER — HYDROCODONE-ACETAMINOPHEN 5-325 MG PO TABS
1.0000 | ORAL_TABLET | ORAL | Status: DC | PRN
  Administered 2015-12-16: 1 via ORAL
  Filled 2015-12-16 (×2): qty 1

## 2015-12-16 MED ORDER — DIPHENHYDRAMINE HCL 12.5 MG/5ML PO ELIX: 12.5000 mg | ORAL_SOLUTION | Freq: Four times a day (QID) | ORAL | Status: DC | PRN

## 2015-12-16 MED ORDER — HEMOSTATIC AGENTS (NO CHARGE) OPTIME
TOPICAL | Status: DC | PRN
  Administered 2015-12-16: 1 via TOPICAL

## 2015-12-16 MED ORDER — 0.9 % SODIUM CHLORIDE (POUR BTL) OPTIME
TOPICAL | Status: DC | PRN
  Administered 2015-12-16: 1000 mL

## 2015-12-16 MED ORDER — MIDAZOLAM HCL 2 MG/2ML IJ SOLN
INTRAMUSCULAR | Status: AC
  Filled 2015-12-16: qty 2

## 2015-12-16 MED ORDER — POVIDONE-IODINE 10 % EX OINT
TOPICAL_OINTMENT | CUTANEOUS | Status: AC
  Filled 2015-12-16: qty 1

## 2015-12-16 MED ORDER — VANCOMYCIN HCL IN DEXTROSE 1-5 GM/200ML-% IV SOLN
INTRAVENOUS | Status: AC
  Filled 2015-12-16: qty 200

## 2015-12-16 MED ORDER — MIDAZOLAM HCL 2 MG/2ML IJ SOLN
1.0000 mg | INTRAMUSCULAR | Status: DC | PRN
  Administered 2015-12-16: 2 mg via INTRAVENOUS

## 2015-12-16 MED ORDER — FENTANYL CITRATE (PF) 100 MCG/2ML IJ SOLN: 25.0000 ug | INTRAMUSCULAR | Status: DC | PRN

## 2015-12-16 MED ORDER — PROPOFOL 10 MG/ML IV BOLUS
INTRAVENOUS | Status: DC | PRN
  Administered 2015-12-16: 100 mg via INTRAVENOUS

## 2015-12-16 MED ORDER — POVIDONE-IODINE 10 % OINT PACKET
TOPICAL_OINTMENT | CUTANEOUS | Status: DC | PRN
  Administered 2015-12-16: 1 via TOPICAL

## 2015-12-16 MED ORDER — PANTOPRAZOLE SODIUM 40 MG PO TBEC
40.0000 mg | DELAYED_RELEASE_TABLET | Freq: Every day | ORAL | Status: DC
  Administered 2015-12-17 – 2015-12-18 (×2): 40 mg via ORAL
  Filled 2015-12-16 (×2): qty 1

## 2015-12-16 MED ORDER — ROCURONIUM BROMIDE 50 MG/5ML IV SOLN
INTRAVENOUS | Status: AC
  Filled 2015-12-16: qty 1

## 2015-12-16 MED ORDER — HYDROCHLOROTHIAZIDE 12.5 MG PO CAPS
12.5000 mg | ORAL_CAPSULE | Freq: Every day | ORAL | Status: DC
  Filled 2015-12-16 (×2): qty 1

## 2015-12-16 MED ORDER — POTASSIUM CHLORIDE CRYS ER 20 MEQ PO TBCR
20.0000 meq | EXTENDED_RELEASE_TABLET | Freq: Two times a day (BID) | ORAL | Status: DC
  Administered 2015-12-16 – 2015-12-18 (×4): 20 meq via ORAL
  Filled 2015-12-16: qty 2
  Filled 2015-12-16 (×3): qty 1

## 2015-12-16 MED ORDER — FENTANYL CITRATE (PF) 100 MCG/2ML IJ SOLN
INTRAMUSCULAR | Status: DC | PRN
  Administered 2015-12-16: 50 ug via INTRAVENOUS
  Administered 2015-12-16: 100 ug via INTRAVENOUS
  Administered 2015-12-16: 25 ug via INTRAVENOUS

## 2015-12-16 MED ORDER — NEOSTIGMINE METHYLSULFATE 10 MG/10ML IV SOLN
INTRAVENOUS | Status: DC | PRN
  Administered 2015-12-16: 4 mg via INTRAVENOUS

## 2015-12-16 MED ORDER — PROPOFOL 10 MG/ML IV BOLUS
INTRAVENOUS | Status: AC
  Filled 2015-12-16: qty 20

## 2015-12-16 MED ORDER — LACTATED RINGERS IV SOLN
INTRAVENOUS | Status: DC
  Administered 2015-12-16: 1000 mL via INTRAVENOUS
  Administered 2015-12-16: 09:00:00 via INTRAVENOUS

## 2015-12-16 MED ORDER — ONDANSETRON HCL 4 MG/2ML IJ SOLN
INTRAMUSCULAR | Status: AC
  Filled 2015-12-16: qty 2

## 2015-12-16 MED ORDER — ONDANSETRON HCL 4 MG/2ML IJ SOLN
INTRAMUSCULAR | Status: DC | PRN
  Administered 2015-12-16: 4 mg via INTRAVENOUS

## 2015-12-16 MED ORDER — ENOXAPARIN SODIUM 40 MG/0.4ML ~~LOC~~ SOLN
40.0000 mg | Freq: Once | SUBCUTANEOUS | Status: AC
  Administered 2015-12-16: 40 mg via SUBCUTANEOUS
  Filled 2015-12-16: qty 0.4

## 2015-12-16 MED ORDER — KETOROLAC TROMETHAMINE 30 MG/ML IJ SOLN
30.0000 mg | Freq: Once | INTRAMUSCULAR | Status: AC
  Administered 2015-12-16: 30 mg via INTRAVENOUS
  Filled 2015-12-16: qty 1

## 2015-12-16 MED ORDER — HYDROMORPHONE HCL 1 MG/ML IJ SOLN: 1.0000 mg | INTRAMUSCULAR | Status: DC | PRN

## 2015-12-16 MED ORDER — GLYCOPYRROLATE 0.2 MG/ML IJ SOLN
INTRAMUSCULAR | Status: DC | PRN
  Administered 2015-12-16: .5 mg via INTRAVENOUS

## 2015-12-16 SURGICAL SUPPLY — 46 items
APPLIER CLIP 11 MED OPEN (CLIP) ×6
BAG HAMPER (MISCELLANEOUS) ×3 IMPLANT
BINDER BREAST LRG (GAUZE/BANDAGES/DRESSINGS) ×3 IMPLANT
CLIP APPLIE 11 MED OPEN (CLIP) ×2 IMPLANT
CLOTH BEACON ORANGE TIMEOUT ST (SAFETY) ×3 IMPLANT
COVER LIGHT HANDLE STERIS (MISCELLANEOUS) ×6 IMPLANT
DRAPE HALF SHEET 40X57 (DRAPES) ×3 IMPLANT
DRAPE UTILITY W/TAPE 26X15 (DRAPES) ×3 IMPLANT
DURAPREP 26ML APPLICATOR (WOUND CARE) ×3 IMPLANT
ELECT REM PT RETURN 9FT ADLT (ELECTROSURGICAL) ×3
ELECTRODE REM PT RTRN 9FT ADLT (ELECTROSURGICAL) ×1 IMPLANT
EVACUATOR DRAINAGE 10X20 100CC (DRAIN) ×1 IMPLANT
EVACUATOR SILICONE 100CC (DRAIN) ×2
GAUZE SPONGE 4X4 12PLY STRL (GAUZE/BANDAGES/DRESSINGS) ×3 IMPLANT
GLOVE BIO SURGEON STRL SZ 6.5 (GLOVE) ×2 IMPLANT
GLOVE BIO SURGEONS STRL SZ 6.5 (GLOVE) ×1
GLOVE BIOGEL PI IND STRL 6.5 (GLOVE) ×1 IMPLANT
GLOVE BIOGEL PI IND STRL 7.0 (GLOVE) ×1 IMPLANT
GLOVE BIOGEL PI INDICATOR 6.5 (GLOVE) ×2
GLOVE BIOGEL PI INDICATOR 7.0 (GLOVE) ×2
GLOVE ECLIPSE 7.0 STRL STRAW (GLOVE) ×3 IMPLANT
GLOVE EXAM NITRILE MD LF STRL (GLOVE) ×3 IMPLANT
GLOVE SURG SS PI 7.5 STRL IVOR (GLOVE) ×3 IMPLANT
GOWN STRL REUS W/TWL LRG LVL3 (GOWN DISPOSABLE) ×9 IMPLANT
HEMOSTAT ARISTA ABSORB 1G (MISCELLANEOUS) ×3 IMPLANT
INST SET MINOR GENERAL (KITS) ×3 IMPLANT
KIT ROOM TURNOVER APOR (KITS) ×3 IMPLANT
MANIFOLD NEPTUNE II (INSTRUMENTS) ×3 IMPLANT
NS IRRIG 1000ML POUR BTL (IV SOLUTION) ×3 IMPLANT
PACK MINOR (CUSTOM PROCEDURE TRAY) ×3 IMPLANT
PAD ARMBOARD 7.5X6 YLW CONV (MISCELLANEOUS) ×3 IMPLANT
SET BASIN LINEN APH (SET/KITS/TRAYS/PACK) ×3 IMPLANT
SPONGE DRAIN TRACH 4X4 STRL 2S (GAUZE/BANDAGES/DRESSINGS) ×3 IMPLANT
SPONGE INTESTINAL PEANUT (DISPOSABLE) IMPLANT
SPONGE LAP 18X18 X RAY DECT (DISPOSABLE) ×6 IMPLANT
STAPLER VISISTAT (STAPLE) ×3 IMPLANT
SUT ETHILON 3 0 FSL (SUTURE) ×3 IMPLANT
SUT SILK 2 0 (SUTURE) ×2
SUT SILK 2 0 SH (SUTURE) ×3 IMPLANT
SUT SILK 2-0 18XBRD TIE 12 (SUTURE) ×1 IMPLANT
SUT VIC AB 2-0 CT1 27 (SUTURE) ×12
SUT VIC AB 2-0 CT1 TAPERPNT 27 (SUTURE) ×6 IMPLANT
SUT VIC AB 3-0 SH 27 (SUTURE) ×2
SUT VIC AB 3-0 SH 27X BRD (SUTURE) ×1 IMPLANT
SUT VICRYL AB 2 0 TIES (SUTURE) ×3 IMPLANT
TAPE CLOTH SURG 4X10 WHT LF (GAUZE/BANDAGES/DRESSINGS) ×3 IMPLANT

## 2015-12-16 NOTE — Interval H&P Note (Signed)
History and Physical Interval Note:  12/16/2015 7:20 AM  Sheila Garrison  has presented today for surgery, with the diagnosis of left breast cancer  The various methods of treatment have been discussed with the patient and family. After consideration of risks, benefits and other options for treatment, the patient has consented to  Procedure(s): MASTECTOMY MODIFIED RADICAL (Left) as a surgical intervention .  The patient's history has been reviewed, patient examined, no change in status, stable for surgery.  I have reviewed the patient's chart and labs.  Questions were answered to the patient's satisfaction.     Aviva Signs A

## 2015-12-16 NOTE — Anesthesia Preprocedure Evaluation (Signed)
Anesthesia Evaluation  Patient identified by MRN, date of birth, ID band Patient awake    Reviewed: Allergy & Precautions, H&P , NPO status , Patient's Chart, lab work & pertinent test results  Airway Mallampati: I  TM Distance: >3 FB Neck ROM: Full    Dental  (+) Edentulous Upper, Edentulous Lower   Pulmonary shortness of breath, COPD, Current Smoker, former smoker,    breath sounds clear to auscultation       Cardiovascular hypertension, Pt. on medications  Rhythm:Regular Rate:Normal     Neuro/Psych PSYCHIATRIC DISORDERS Anxiety    GI/Hepatic GERD  Controlled and Medicated,  Endo/Other    Renal/GU      Musculoskeletal   Abdominal   Peds  Hematology   Anesthesia Other Findings   Reproductive/Obstetrics                             Anesthesia Physical Anesthesia Plan  ASA: III  Anesthesia Plan: General   Post-op Pain Management:    Induction: Intravenous, Rapid sequence and Cricoid pressure planned  Airway Management Planned: Oral ETT  Additional Equipment:   Intra-op Plan:   Post-operative Plan: Extubation in OR  Informed Consent: I have reviewed the patients History and Physical, chart, labs and discussed the procedure including the risks, benefits and alternatives for the proposed anesthesia with the patient or authorized representative who has indicated his/her understanding and acceptance.     Plan Discussed with:   Anesthesia Plan Comments:         Anesthesia Quick Evaluation

## 2015-12-16 NOTE — Addendum Note (Signed)
Addendum  created 12/16/15 0955 by Laretta Alstrom, CRNA   Anesthesia Intra Meds edited, Charge Capture section accepted

## 2015-12-16 NOTE — Op Note (Signed)
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  Anes:  Gen. endotracheal  Indications:  Patient is a 60 year old white female with a history of a right breast carcinoma who now presents with a left breast carcinoma with metastasis to the left axilla. The risks and benefits of the procedure including bleeding, infection, cardiopulmonary difficulties, nerve injury, and the possibility of left arm swelling were fully explained to the patient, who gave informed consent.  Procedure note:  The patient is placed the supine position. After induction of general endotracheal anesthesia, the abdomen was prepped and draped using the usual sterile technique with her prep. Surgical site confirmation was performed.  An elliptical incision was made medial to lateral around the left nipple. A superior flap was then formed to the clavicle and an inferior flap formed to the chest wall. The left breast was then removed medial to lateral off the pectoralis major muscle. This included the fascia. A suture was placed superiorly for orientation purposes. The left breast was removed and sent to pathology for further examination. The left axillary dissection was performed. There was multiple matted hard lymph nodes around the thoracodorsal artery and vein. Any small vessels were ligated using clips. Care was taken to avoid the intercostal brachial nerve. The nodes were removed and sent to pathology further examination. Arista was placed into the left axilla. No abnormal bleeding was noted at the end of the procedure. The left breast flap was irrigated with normal saline.  A #10 flat Jackson-Pratt drain was placed over the flap and into the axilla. It exited the skin inferior to the incision line. It was secured into place using a 3-0  nylon interrupted suture. Subcutaneous layer was reapproximated using a 2-0 Vicryl interrupted suture. The skin was closed using staples. Betadine ointment and a dry sterile dressing were applied.  All tape and needle counts were correct at the end of the procedure. The patient was extubated in the operating room and transferred to PACU in stable condition.  Complications:  None  EBL:  75 mL  Specimen:  Left breast, suture superior Left axillary contents  Drains: JP drain to left breast flap

## 2015-12-16 NOTE — Progress Notes (Signed)
Patient had 02 out of nose when I arrived in room. Pt's Sp02 was 77%. Pt stated she was about to go to the bathroom and took out of nose. Patient placed back on 3LNC and RT placed extension in room so patient could go while being on 02. Inhaler and Incentive given. 02 increased back to 92%. RT will continue to monitor.

## 2015-12-16 NOTE — Transfer of Care (Signed)
Immediate Anesthesia Transfer of Care Note  Patient: Sheila Garrison  Procedure(s) Performed: Procedure(s): MASTECTOMY MODIFIED RADICAL (Left)  Patient Location: PACU  Anesthesia Type:General  Level of Consciousness: awake, alert , oriented and patient cooperative  Airway & Oxygen Therapy: Patient Spontanous Breathing and Patient connected to nasal cannula oxygen  Post-op Assessment: Report given to RN and Post -op Vital signs reviewed and stable  Post vital signs: Reviewed and stable  Last Vitals:  Vitals:   12/16/15 0945 12/16/15 0948  BP: (!) (P) 118/39 (!) 118/39  Pulse:  88  Resp: (P) 18 18  Temp: (P) 36.6 C 37.4 C    Last Pain:  Vitals:   12/16/15 0948  TempSrc: Oral      Patients Stated Pain Goal: 9 ([id number])  Complications: No apparent anesthesia complications

## 2015-12-16 NOTE — Anesthesia Postprocedure Evaluation (Signed)
Anesthesia Post Note  Patient: NANCYLEE AYLWARD  Procedure(s) Performed: Procedure(s) (LRB): MASTECTOMY MODIFIED RADICAL (Left)  Patient location during evaluation: PACU Anesthesia Type: General Level of consciousness: awake and alert and oriented Pain management: pain level controlled Vital Signs Assessment: post-procedure vital signs reviewed and stable Respiratory status: spontaneous breathing Cardiovascular status: blood pressure returned to baseline and stable Postop Assessment: no headache, no backache and no signs of nausea or vomiting Anesthetic complications: no    Last Vitals:  Vitals:   12/16/15 0945 12/16/15 0948  BP: (!) (P) 118/39 (!) 118/39  Pulse:  88  Resp: (P) 18 18  Temp: (P) 36.6 C 37.4 C    Last Pain:  Vitals:   12/16/15 0948  TempSrc: Oral                 Marvin Grabill

## 2015-12-17 DIAGNOSIS — J441 Chronic obstructive pulmonary disease with (acute) exacerbation: Secondary | ICD-10-CM | POA: Diagnosis not present

## 2015-12-17 DIAGNOSIS — R4182 Altered mental status, unspecified: Secondary | ICD-10-CM | POA: Diagnosis not present

## 2015-12-17 LAB — BASIC METABOLIC PANEL
Anion gap: 7 (ref 5–15)
BUN: 11 mg/dL (ref 6–20)
CO2: 31 mmol/L (ref 22–32)
CREATININE: 0.5 mg/dL (ref 0.44–1.00)
Calcium: 8.5 mg/dL — ABNORMAL LOW (ref 8.9–10.3)
Chloride: 99 mmol/L — ABNORMAL LOW (ref 101–111)
GFR calc Af Amer: >60 mL/min (ref >60)
GLUCOSE: 109 mg/dL — AB (ref 65–99)
POTASSIUM: 4.2 mmol/L (ref 3.5–5.1)
Sodium: 137 mmol/L (ref 135–145)

## 2015-12-17 LAB — CBC
HCT: 45.4 % (ref 36.0–46.0)
Hemoglobin: 14 g/dL (ref 12.0–15.0)
MCH: 30.6 pg (ref 26.0–34.0)
MCHC: 30.8 g/dL (ref 30.0–36.0)
MCV: 99.1 fL (ref 78.0–100.0)
PLATELETS: 204 10*3/uL (ref 150–400)
RBC: 4.58 MIL/uL (ref 3.87–5.11)
RDW: 15.6 % — AB (ref 11.5–15.5)
WBC: 11.4 10*3/uL — ABNORMAL HIGH (ref 4.0–10.5)

## 2015-12-17 NOTE — Progress Notes (Signed)
1 Day Post-Op  Subjective: *Patient with some nausea this am, though she wants solid food.**  Objective: Vital signs in last 24 hours: Temp:  [97 F (36.1 C)-99.2 F (37.3 C)] 98.4 F (36.9 C) (11/11 0726) Pulse Rate:  [74-101] 101 (11/11 0536) Resp:  [13-20] 18 (11/10 1433) BP: (101-148)/(51-62) 148/58 (11/11 0536) SpO2:  [77 %-100 %] 95 % (11/11 0536) Last BM Date: 12/15/15  Intake/Output from previous day: 11/10 0701 - 11/11 0700 In: 1675 [I.V.:1645] Out: 370 [Urine:200; Drains:70; Blood:100] Intake/Output this shift: No intake/output data recorded.  General appearance: alert, cooperative and fatigued Resp: clear to auscultation bilaterally Breasts: Left breast dressing dry and intact.  JP drain with sanguinour drainage present. Cardio: regular rate and rhythm, S1, S2 normal, no murmur, click, rub or gallop  Lab Results:   Recent Labs  12/17/15 0731  WBC 11.4*  HGB 14.0  HCT 45.4  PLT 204   BMET  Recent Labs  12/17/15 0731  NA 137  K 4.2  CL 99*  CO2 31  GLUCOSE 109*  BUN 11  CREATININE 0.50  CALCIUM 8.5*   PT/INR No results for input(s): LABPROT, INR in the last 72 hours.  Studies/Results: No results found.  Anti-infectives: Anti-infectives    Start     Dose/Rate Route Frequency Ordered Stop   12/16/15 0622  vancomycin (VANCOCIN) IVPB 1000 mg/200 mL premix     1,000 mg 200 mL/hr over 60 Minutes Intravenous On call to O.R. 12/16/15 0622 12/16/15 0728      Assessment/Plan: s/p Procedure(s): MASTECTOMY MODIFIED RADICAL Imp:  Stable on postop 1, nausea secondary to pain meds and/or anesthesia.  To receive zofran today.  Advance to regular diet.  Check CBC in am.  LOS: 0 days    Sheila Garrison 12/17/2015

## 2015-12-18 ENCOUNTER — Encounter (HOSPITAL_COMMUNITY): Payer: Self-pay | Admitting: *Deleted

## 2015-12-18 ENCOUNTER — Observation Stay (HOSPITAL_COMMUNITY): Payer: PRIVATE HEALTH INSURANCE

## 2015-12-18 ENCOUNTER — Inpatient Hospital Stay (HOSPITAL_COMMUNITY)
Admission: EM | Admit: 2015-12-18 | Discharge: 2015-12-21 | DRG: 190 | Disposition: A | Discharge status: Home / Self Care | Payer: PRIVATE HEALTH INSURANCE | Attending: Internal Medicine | Admitting: Internal Medicine

## 2015-12-18 ENCOUNTER — Emergency Department (HOSPITAL_COMMUNITY): Payer: PRIVATE HEALTH INSURANCE

## 2015-12-18 DIAGNOSIS — I10 Essential (primary) hypertension: Secondary | ICD-10-CM

## 2015-12-18 DIAGNOSIS — R778 Other specified abnormalities of plasma proteins: Secondary | ICD-10-CM

## 2015-12-18 DIAGNOSIS — F17210 Nicotine dependence, cigarettes, uncomplicated: Secondary | ICD-10-CM | POA: Diagnosis present

## 2015-12-18 DIAGNOSIS — R0902 Hypoxemia: Secondary | ICD-10-CM

## 2015-12-18 DIAGNOSIS — J441 Chronic obstructive pulmonary disease with (acute) exacerbation: Principal | ICD-10-CM | POA: Diagnosis present

## 2015-12-18 DIAGNOSIS — R7989 Other specified abnormal findings of blood chemistry: Secondary | ICD-10-CM

## 2015-12-18 DIAGNOSIS — Z7951 Long term (current) use of inhaled steroids: Secondary | ICD-10-CM

## 2015-12-18 DIAGNOSIS — E785 Hyperlipidemia, unspecified: Secondary | ICD-10-CM | POA: Diagnosis present

## 2015-12-18 DIAGNOSIS — E784 Other hyperlipidemia: Secondary | ICD-10-CM

## 2015-12-18 DIAGNOSIS — Z825 Family history of asthma and other chronic lower respiratory diseases: Secondary | ICD-10-CM

## 2015-12-18 DIAGNOSIS — I5033 Acute on chronic diastolic (congestive) heart failure: Secondary | ICD-10-CM | POA: Diagnosis present

## 2015-12-18 DIAGNOSIS — G9341 Metabolic encephalopathy: Secondary | ICD-10-CM | POA: Diagnosis present

## 2015-12-18 DIAGNOSIS — Z9221 Personal history of antineoplastic chemotherapy: Secondary | ICD-10-CM

## 2015-12-18 DIAGNOSIS — C773 Secondary and unspecified malignant neoplasm of axilla and upper limb lymph nodes: Secondary | ICD-10-CM | POA: Diagnosis present

## 2015-12-18 DIAGNOSIS — D72829 Elevated white blood cell count, unspecified: Secondary | ICD-10-CM | POA: Diagnosis present

## 2015-12-18 DIAGNOSIS — R4182 Altered mental status, unspecified: Secondary | ICD-10-CM | POA: Diagnosis present

## 2015-12-18 DIAGNOSIS — Z8614 Personal history of Methicillin resistant Staphylococcus aureus infection: Secondary | ICD-10-CM

## 2015-12-18 DIAGNOSIS — I248 Other forms of acute ischemic heart disease: Secondary | ICD-10-CM | POA: Diagnosis present

## 2015-12-18 DIAGNOSIS — J90 Pleural effusion, not elsewhere classified: Secondary | ICD-10-CM

## 2015-12-18 DIAGNOSIS — Z833 Family history of diabetes mellitus: Secondary | ICD-10-CM

## 2015-12-18 DIAGNOSIS — Z9013 Acquired absence of bilateral breasts and nipples: Secondary | ICD-10-CM

## 2015-12-18 DIAGNOSIS — Z923 Personal history of irradiation: Secondary | ICD-10-CM

## 2015-12-18 DIAGNOSIS — Z79899 Other long term (current) drug therapy: Secondary | ICD-10-CM

## 2015-12-18 DIAGNOSIS — G8918 Other acute postprocedural pain: Secondary | ICD-10-CM

## 2015-12-18 DIAGNOSIS — Z803 Family history of malignant neoplasm of breast: Secondary | ICD-10-CM

## 2015-12-18 DIAGNOSIS — C50112 Malignant neoplasm of central portion of left female breast: Secondary | ICD-10-CM | POA: Diagnosis present

## 2015-12-18 DIAGNOSIS — Z8249 Family history of ischemic heart disease and other diseases of the circulatory system: Secondary | ICD-10-CM

## 2015-12-18 DIAGNOSIS — J9601 Acute respiratory failure with hypoxia: Secondary | ICD-10-CM | POA: Diagnosis present

## 2015-12-18 DIAGNOSIS — I110 Hypertensive heart disease with heart failure: Secondary | ICD-10-CM | POA: Diagnosis present

## 2015-12-18 LAB — CBC
HCT: 43.3 % (ref 36.0–46.0)
Hemoglobin: 13.1 g/dL (ref 12.0–15.0)
MCH: 30.2 pg (ref 26.0–34.0)
MCHC: 30.3 g/dL (ref 30.0–36.0)
MCV: 99.8 fL (ref 78.0–100.0)
PLATELETS: 192 10*3/uL (ref 150–400)
RBC: 4.34 MIL/uL (ref 3.87–5.11)
RDW: 15 % (ref 11.5–15.5)
WBC: 11.7 10*3/uL — AB (ref 4.0–10.5)

## 2015-12-18 LAB — BASIC METABOLIC PANEL
ANION GAP: 6 (ref 5–15)
ANION GAP: 7 (ref 5–15)
BUN: 10 mg/dL (ref 6–20)
BUN: 12 mg/dL (ref 6–20)
CALCIUM: 9.2 mg/dL (ref 8.9–10.3)
CALCIUM: 9.3 mg/dL (ref 8.9–10.3)
CO2: 34 mmol/L — ABNORMAL HIGH (ref 22–32)
CO2: 34 mmol/L — ABNORMAL HIGH (ref 22–32)
Chloride: 98 mmol/L — ABNORMAL LOW (ref 101–111)
Chloride: 96 mmol/L — ABNORMAL LOW (ref 101–111)
Creatinine, Ser: 0.45 mg/dL (ref 0.44–1.00)
Creatinine, Ser: 0.53 mg/dL (ref 0.44–1.00)
GLUCOSE: 118 mg/dL — AB (ref 65–99)
Glucose, Bld: 107 mg/dL — ABNORMAL HIGH (ref 65–99)
Potassium: 4.5 mmol/L (ref 3.5–5.1)
Potassium: 4.2 mmol/L (ref 3.5–5.1)
SODIUM: 137 mmol/L (ref 135–145)
Sodium: 138 mmol/L (ref 135–145)

## 2015-12-18 LAB — CBC WITH DIFFERENTIAL/PLATELET
BASOS ABS: 0 10*3/uL (ref 0.0–0.1)
Basophils Relative: 0 %
EOS PCT: 0 %
Eosinophils Absolute: 0 10*3/uL (ref 0.0–0.7)
HCT: 41.3 % (ref 36.0–46.0)
Hemoglobin: 13 g/dL (ref 12.0–15.0)
LYMPHS PCT: 9 %
Lymphs Abs: 1 10*3/uL (ref 0.7–4.0)
MCH: 30.8 pg (ref 26.0–34.0)
MCHC: 31.5 g/dL (ref 30.0–36.0)
MCV: 97.9 fL (ref 78.0–100.0)
MONO ABS: 1.1 10*3/uL — AB (ref 0.1–1.0)
Monocytes Relative: 9 %
Neutro Abs: 9.2 10*3/uL — ABNORMAL HIGH (ref 1.7–7.7)
Neutrophils Relative %: 82 %
PLATELETS: 198 10*3/uL (ref 150–400)
RBC: 4.22 MIL/uL (ref 3.87–5.11)
RDW: 14.9 % (ref 11.5–15.5)
WBC: 11.3 10*3/uL — ABNORMAL HIGH (ref 4.0–10.5)

## 2015-12-18 LAB — TROPONIN I: TROPONIN I: 0.05 ng/mL — AB (ref <0.03)

## 2015-12-18 LAB — LACTIC ACID, PLASMA: LACTIC ACID, VENOUS: 0.9 mmol/L (ref 0.5–1.9)

## 2015-12-18 LAB — BRAIN NATRIURETIC PEPTIDE: B Natriuretic Peptide: 205 pg/mL — ABNORMAL HIGH (ref 0.0–100.0)

## 2015-12-18 MED ORDER — MORPHINE SULFATE (PF) 4 MG/ML IV SOLN: 4.0000 mg | INTRAVENOUS | Status: DC | PRN

## 2015-12-18 MED ORDER — ALBUTEROL (5 MG/ML) CONTINUOUS INHALATION SOLN
10.0000 mg/h | INHALATION_SOLUTION | Freq: Once | RESPIRATORY_TRACT | Status: AC
  Administered 2015-12-18: 10 mg/h via RESPIRATORY_TRACT
  Filled 2015-12-18: qty 20

## 2015-12-18 MED ORDER — ONDANSETRON 4 MG PO TBDP: 4.0000 mg | ORAL_TABLET | Freq: Three times a day (TID) | ORAL | 1 refills | Status: DC | PRN

## 2015-12-18 MED ORDER — ONDANSETRON HCL 4 MG/2ML IJ SOLN
4.0000 mg | INTRAMUSCULAR | Status: DC | PRN
Start: 2015-12-18 — End: 2015-12-21

## 2015-12-18 MED ORDER — IOPAMIDOL (ISOVUE-370) INJECTION 76%
100.0000 mL | Freq: Once | INTRAVENOUS | Status: AC | PRN
  Administered 2015-12-18: 100 mL via INTRAVENOUS

## 2015-12-18 MED ORDER — SODIUM CHLORIDE 0.9 % IV SOLN
INTRAVENOUS | Status: DC
  Administered 2015-12-18: 22:00:00 via INTRAVENOUS

## 2015-12-18 MED ORDER — ALBUTEROL SULFATE (2.5 MG/3ML) 0.083% IN NEBU
2.5000 mg | INHALATION_SOLUTION | Freq: Four times a day (QID) | RESPIRATORY_TRACT | Status: DC
  Administered 2015-12-18: 2.5 mg via RESPIRATORY_TRACT
  Filled 2015-12-18: qty 3

## 2015-12-18 MED ORDER — IPRATROPIUM BROMIDE 0.02 % IN SOLN
1.0000 mg | Freq: Once | RESPIRATORY_TRACT | Status: AC
  Administered 2015-12-18: 1 mg via RESPIRATORY_TRACT
  Filled 2015-12-18: qty 5

## 2015-12-18 NOTE — ED Notes (Signed)
75 cc of blood emptied from left JP drain

## 2015-12-18 NOTE — ED Provider Notes (Signed)
[facility] DEPT Provider Note   CSN: [id number] Arrival date & time: 12/18/15  1928     History   Chief Complaint Chief Complaint  Patient presents with  . Altered Mental Status    HPI Sheila Garrison is a 60 y.o. female.  The history is provided by the patient, the EMS personnel and a relative. The history is limited by the condition of the patient (confusion).  Altered Mental Status    Pt was seen at [facility]. Per EMS, pt's family and pt: EMS called to home for pt with AMS. EMS found pt with O2 Sats "72% R/A." Pt was placed on NRB with O2 Sats increasing to "90's." Family states pt was discharged from the hospital earlier today for s/p left mastectomy. Family states pt "hasn't been acting like her normal self" since coming home. Pt only c/o post-op pain left chest wall. Denies injury, no falls, no LOC, no abd pain, no N/V/D, no fevers, no cough.     Past Medical History:  Diagnosis Date  . Anxiety   . Breast CA ([place]) 07/01/2010   rt breast ca  . COPD (chronic obstructive pulmonary disease) ([place])   . Family history of breast cancer    Aunt on father's side  . Hyperlipemia   . Hypertension   . MRSA (methicillin resistant staph aureus) culture positive    in breast  . Neuropathy ([place])    bilateral toes  . Nipple discharge    with pain, infection, lump  . S/P radiation therapy 01/08/11 - 02/22/11   Right Chest Wall/ 50 Gy / 25 Fractions, Right Shaft Region/ 50 gy/25 Fractions, Right Axillary Boost/500 cGy/25 Fractions, Right Chest Wall Boost/10 Gy/5 Fractions  . Status post chemotherapy    6 cycles of carboplatin and docetaxel with trastuzumab every 21 days followed by surgery  . Tobacco abuse 06/22/2015  . Use of letrozole (Femara) 03/09/2011  . Wears dentures     Patient Active Problem List   Diagnosis Date Noted  . Breast cancer metastasized to axillary lymph node, left ([place]) 12/16/2015  . Tobacco abuse 06/22/2015  . Vitamin D deficiency 04/16/2014  . GAD (generalized  anxiety disorder) 04/16/2014  . GERD (gastroesophageal reflux disease) 04/16/2014  . Osteopenia 11/18/2013  . Lymphedema of arm 10/14/2012  . COPD (chronic obstructive pulmonary disease) ([place]) 10/09/2011  . Shortness of breath 03/11/2011  . Hypertension 09/06/2010  . Hyperlipidemia 09/06/2010  . Stage III (T4N3) R Br cancer  06/21/2010    Past Surgical History:  Procedure Laterality Date  . BREAST SURGERY  2012  . DILATION AND CURETTAGE OF UTERUS    . port a catheter insertion    . PORT-A-CATH REMOVAL Left 01/15/2013   Procedure: REMOVAL PORT-A-CATH;  Surgeon: Scherry Ran, MD;  Location: [facility];  Service: General;  Laterality: Left;  . TUBAL LIGATION      OB History    No data available       Home Medications    Prior to Admission medications   Medication Sig Start Date End Date Taking? Authorizing Provider  budesonide-formoterol (SYMBICORT) 160-4.5 MCG/ACT inhaler Inhale 2 puffs into the lungs 2 (two) times daily. 04/08/15   [name], FNP  Cholecalciferol (VITAMIN D-3 PO) Take 400 Units by mouth daily. 400 units a day    Historical Provider, MD  clobetasol cream (TEMOVATE) [id number] % Apply 1 application topically as needed. rash 10/07/15   Terald Sleeper, PA-C  fexofenadine (ALLEGRA) 180 MG tablet Take 180 mg  by mouth daily.      Historical Provider, MD  Ginkgo Biloba 40 MG TABS Take 1 tablet by mouth daily.    Historical Provider, MD  letrozole (FEMARA) 2.5 MG tablet Take 1 tablet (2.5 mg total) by mouth daily. 11/29/14   Baird Cancer, PA-C  LORazepam (ATIVAN) 0.5 MG tablet TAKE ONE TABLET BY MOUTH EVERY 8 HOURS AS NEEDED Patient taking differently: TAKE ONE TABLET BY MOUTH EVERY 8 HOURS AS NEEDED FOR ANXIETY 11/16/15   Terald Sleeper, PA-C  losartan-hydrochlorothiazide (HYZAAR) 100-12.5 MG tablet Take 1 tablet by mouth daily. 10/07/15   Terald Sleeper, PA-C  Multiple Vitamin (MULTIVITAMIN) capsule Take 1 capsule by mouth daily.      Historical Provider, MD  Multiple  Vitamins-Minerals (EMERGEN-C IMMUNE PO) Take 1 tablet by mouth daily.    Historical Provider, MD  naproxen sodium (ANAPROX) 220 MG tablet Take 220 mg by mouth as needed. pain    Historical Provider, MD  omeprazole (PRILOSEC) 40 MG capsule Take 1 capsule (40 mg total) by mouth daily. 10/12/14   [name], FNP  ondansetron (ZOFRAN ODT) 4 MG disintegrating tablet Take 1 tablet (4 mg total) by mouth every 8 (eight) hours as needed for nausea or vomiting. 12/18/15   Aviva Signs, MD  potassium chloride SA (K-DUR,KLOR-CON) 20 MEQ tablet Take 1 tablet (20 mEq total) by mouth 2 (two) times daily. 10/07/15 10/06/16  Terald Sleeper, PA-C  rosuvastatin (CRESTOR) 20 MG tablet Take 1 tablet (20 mg total) by mouth daily. 10/07/15   Terald Sleeper, PA-C    Family History Family History  Problem Relation Age of Onset  . COPD Mother   . Diabetes Father   . Hypertension Father   . COPD Brother   . Cancer Maternal Aunt     ovarian - died of old age  . Cancer Paternal Aunt     had breast ca; died of of a different cancer  . Cancer Brother     Social History Social History  Substance Use Topics  . Smoking status: Current Every Day Smoker    Packs/day: 0.25    Years: 30.00    Types: Cigarettes  . Smokeless tobacco: Never Used  . Alcohol use No     Allergies   Patient has no known allergies.   Review of Systems Review of Systems  Unable to perform ROS: Mental status change     Physical Exam Updated Vital Signs BP 127/69 (BP Location: Right Arm)   Pulse (!) 121   Temp 98.1 F (36.7 C) (Oral)   Resp 24   Ht 5\' 4"  (1.626 m)   Wt 154 lb (69.9 kg)   LMP 03/08/2010   SpO2 95%   BMI 26.43 kg/m   Physical Exam 1935: Physical examination:  Nursing notes reviewed; Vital signs and O2 SAT reviewed;  Constitutional: Well developed, Well nourished, Uncomfortable appearing.; Head:  Normocephalic, atraumatic; Eyes: EOMI, PERRL, No scleral icterus; ENMT: Mouth and pharynx normal, Mucous membranes  moist; Neck: Supple, Full range of motion, No lymphadenopathy; Cardiovascular: Tachycardic rate and rhythm, No gallop; Respiratory: Breath sounds diminished & equal bilaterally, No wheezes.  Speaking short sentences, sitting upright, tachypneic.; Chest: Binder to chest, JP drain left chest, +sanguinous drainage, no purulence. Movement normal; Abdomen: Soft, Nontender, Nondistended, Normal bowel sounds; Genitourinary: No CVA tenderness; Extremities: Pulses normal, No tenderness, No edema, No calf edema or asymmetry.; Neuro: Awake, alert, confused re: events of the day today. Major CN grossly intact.  Speech clear. Moves all extremities spontaneously without apparent gross focal motor deficits.; Skin: Color normal, Warm, Dry.   ED Treatments / Results  Labs (all labs ordered are listed, but only abnormal results are displayed)   EKG  EKG Interpretation  Date/Time:  Sunday December 18 2015 19:38:12 EST Ventricular Rate:  115 PR Interval:    QRS Duration: 84 QT Interval:  316 QTC Calculation: 437 R Axis:   142 Text Interpretation:  Sinus tachycardia Probable left atrial enlargement Probable right ventricular hypertrophy When compared with ECG of 12/13/2015 Rate faster Confirmed by [facility]  MD, Nunzio Cory [phone]) on 12/18/2015 7:45:05 PM       Radiology   Procedures Procedures (including critical care time)  Medications Ordered in ED Medications  albuterol (PROVENTIL,VENTOLIN) solution continuous neb (not administered)  ipratropium (ATROVENT) nebulizer solution 1 mg (not administered)  morphine 4 MG/ML injection 4 mg (not administered)  ondansetron (ZOFRAN) injection 4 mg (not administered)     Initial Impression / Assessment and Plan / ED Course  I have reviewed the triage vital signs and the nursing notes.  Pertinent labs & imaging results that were available during my care of the patient were reviewed by me and considered in my medical decision making (see chart for  details).  MDM Reviewed: previous chart, nursing note and vitals Reviewed previous: labs and ECG Interpretation: labs, ECG and x-ray Total time providing critical care: 30-74 minutes. This excludes time spent performing separately reportable procedures and services. Consults: general surgery and admitting MD   CRITICAL CARE Performed by: Alfonzo Feller Total critical care time: 35 minutes Critical care time was exclusive of separately billable procedures and treating other patients. Critical care was necessary to treat or prevent imminent or life-threatening deterioration. Critical care was time spent personally by me on the following activities: development of treatment plan with patient and/or surrogate as well as nursing, discussions with consultants, evaluation of patient's response to treatment, examination of patient, obtaining history from patient or surrogate, ordering and performing treatments and interventions, ordering and review of laboratory studies, ordering and review of radiographic studies, pulse oximetry and re-evaluation of patient's condition.    Results for orders placed or performed during the hospital encounter of [id number]  Basic metabolic panel  Result Value Ref Range   Sodium 137 135 - 145 mmol/L   Potassium 4.2 3.5 - 5.1 mmol/L   Chloride 96 (L) 101 - 111 mmol/L   CO2 34 (H) 22 - 32 mmol/L   Glucose, Bld 118 (H) 65 - 99 mg/dL   BUN 12 6 - 20 mg/dL   Creatinine, Ser 0.53 0.44 - 1.00 mg/dL   Calcium 9.3 8.9 - 10.3 mg/dL   GFR calc non Af Amer >60 >60 mL/min   GFR calc Af Amer >60 >60 mL/min   Anion gap 7 5 - 15  Brain natriuretic peptide  Result Value Ref Range   B Natriuretic Peptide 205.0 (H) 0.0 - 100.0 pg/mL  Troponin I  Result Value Ref Range   Troponin I 0.05 (HH) <0.03 ng/mL  Lactic acid, plasma  Result Value Ref Range   Lactic Acid, Venous 0.9 0.5 - 1.9 mmol/L  CBC with Differential  Result Value Ref Range   WBC 11.3 (H) 4.0 - 10.5 K/uL    RBC 4.22 3.87 - 5.11 MIL/uL   Hemoglobin 13.0 12.0 - 15.0 g/dL   HCT 41.3 36.0 - 46.0 %   MCV 97.9 78.0 - 100.0 fL   MCH 30.8 26.0 - 34.0 pg  MCHC 31.5 30.0 - 36.0 g/dL   RDW 14.9 11.5 - 15.5 %   Platelets 198 150 - 400 K/uL   Neutrophils Relative % 82 %   Neutro Abs 9.2 (H) 1.7 - 7.7 K/uL   Lymphocytes Relative 9 %   Lymphs Abs 1.0 0.7 - 4.0 K/uL   Monocytes Relative 9 %   Monocytes Absolute 1.1 (H) 0.1 - 1.0 K/uL   Eosinophils Relative 0 %   Eosinophils Absolute 0.0 0.0 - 0.7 K/uL   Basophils Relative 0 %   Basophils Absolute 0.0 0.0 - 0.1 K/uL    Dg Chest Port 1 View Result Date: 12/18/2015 CLINICAL DATA:  Acute onset of shortness of breath and altered level of consciousness. Decreased O2 saturation. EXAM: PORTABLE CHEST 1 VIEW COMPARISON:  Chest radiograph performed 12/13/2015 FINDINGS: Vascular congestion is noted. Increased interstitial markings may reflect mild interstitial edema. A small right pleural effusion is suspected. No pneumothorax is seen. The cardiomediastinal silhouette is mildly enlarged. Left-sided skin staples are noted. Clips are seen at the right axilla. No acute osseous abnormalities are seen. IMPRESSION: Vascular congestion and mild cardiomegaly. Increased interstitial markings may reflect mild interstitial edema. Small right pleural effusion suspected. Electronically Signed   By: Garald Balding M.D.   On: 12/18/2015 20:12    2140:  On arrival: pt sitting upright, tachypneic, tachycardic, Sats 88 % R/A, lungs diminished. Hour long neb started (solumedrol held due to new surgical wound). Towards the end of the neb tx: pt appears more comfortable at rest, less tachypneic and tachycardic, leaning back on stretcher, Sats 98-100 % on O2 4L N/C, lungs continue diminished. PE continues on DDx, but will hold CT-A at this time, given improvement with neb tx. Morphine given for post-pain with good effect. BP stable. Troponin mildly elevated, but pt denies CP.  T/C to  General Surgery Dr. Arnoldo Morale, case discussed, including:  HPI, pertinent PM/SHx, VS/PE, dx testing, ED course and treatment:  Agrees with ED treatment, requests to admit to [facility] service, he will consult tomorrow morning. T/C to [facility] Dr. Marin Comment, case discussed, including:  HPI, pertinent PM/SHx, VS/PE, dx testing, ED course and treatment:  Agreeable to admit, requests he will come to the ED for evaluation.     Final Clinical Impressions(s) / ED Diagnoses   Final diagnoses:  None    New Prescriptions New Prescriptions   No medications on file      Francine Graven, DO 12/21/15 1054

## 2015-12-18 NOTE — Discharge Summary (Signed)
Physician Discharge Summary  Patient ID: Sheila Garrison MRN: [id number] DOB/AGE: 04-23-1955 41 y.o.  Admit date: 12/16/2015 Discharge date: 12/18/2015  Admission Diagnoses:Left breast carcinoma with metastasis to axillary lymph node  Discharge Diagnoses: Same Active Problems:   Breast cancer metastasized to axillary lymph node, left [facility])   Discharged Condition: good  Hospital Course: Patient is a 60 year old white female history of right breast carcinoma who presented to the operating room for a left modified radical mastectomy due to left breast carcinoma with metastasis to the axillary lymph nodes. This was performed on 12/16/2015. She tolerated the surgery well. Postoperative course has been remarkable for nausea which is felt to be secondary to pain medication. Her hemoglobin has remained stable. Her JP drainage has been decreasing appropriately. Final pathology is pending. She is being discharged home on 12/18/2015 in good and improving condition.  Treatments: surgery: Left modified radical mastectomy on 12/16/2015  Discharge Exam: Blood pressure (!) 149/59, pulse 100, temperature 98.1 F (36.7 C), temperature source Oral, resp. rate 15, height 5\' 4"  (1.626 m), weight 69.9 kg (154 lb), last menstrual period 03/08/2010, SpO2 96 %. General appearance: alert, cooperative and no distress Resp: clear to auscultation bilaterally Breasts: Left mastectomy site healing well. No hematoma present. JP drain in place. Sanguinous in nature. Cardio: regular rate and rhythm, S1, S2 normal, no murmur, click, rub or gallop  Disposition: 01-Home or Self Care     Medication List    TAKE these medications   budesonide-formoterol 160-4.5 MCG/ACT inhaler Commonly known as:  SYMBICORT Inhale 2 puffs into the lungs 2 (two) times daily.   clobetasol cream 0.05 % Commonly known as:  TEMOVATE Apply 1 application topically as needed. rash   EMERGEN-C IMMUNE PO Take 1 tablet by mouth daily.    fexofenadine 180 MG tablet Commonly known as:  ALLEGRA Take 180 mg by mouth daily.   Ginkgo Biloba 40 MG Tabs Take 1 tablet by mouth daily.   letrozole 2.5 MG tablet Commonly known as:  FEMARA Take 1 tablet (2.5 mg total) by mouth daily.   LORazepam 0.5 MG tablet Commonly known as:  ATIVAN TAKE ONE TABLET BY MOUTH EVERY 8 HOURS AS NEEDED What changed:  See the new instructions.   losartan-hydrochlorothiazide 100-12.5 MG tablet Commonly known as:  HYZAAR Take 1 tablet by mouth daily.   multivitamin capsule Take 1 capsule by mouth daily.   naproxen sodium 220 MG tablet Commonly known as:  ANAPROX Take 220 mg by mouth as needed. pain   omeprazole 40 MG capsule Commonly known as:  PRILOSEC Take 1 capsule (40 mg total) by mouth daily.   ondansetron 4 MG disintegrating tablet Commonly known as:  ZOFRAN ODT Take 1 tablet (4 mg total) by mouth every 8 (eight) hours as needed for nausea or vomiting.   potassium chloride SA 20 MEQ tablet Commonly known as:  K-DUR,KLOR-CON Take 1 tablet (20 mEq total) by mouth 2 (two) times daily.   rosuvastatin 20 MG tablet Commonly known as:  CRESTOR Take 1 tablet (20 mg total) by mouth daily.   VITAMIN D-3 PO Take 400 Units by mouth daily. 400 units a day      Follow-up Information    Jamesetta So, MD. Schedule an appointment as soon as possible for a visit on 12/22/2015.   Specialty:  General Surgery Contact information: [id number] [phone]           Signed: Aviva Signs A 12/18/2015, 8:19 AM

## 2015-12-18 NOTE — ED Notes (Signed)
In and Out attempted for urine specimen. Unable to obtain any urine. Per patient she has been drinking and eating minimally and urinated right before coming to ED.

## 2015-12-18 NOTE — H&P (Signed)
History and Physical    Sheila Garrison [id number] DOB: 04/08/1955 DOA: 12/18/2015  PCP: [name], FNP  Patient coming from: Home   Chief Complaint: AMS  HPI: Sheila Garrison is a 60 y.o. female with a past medical history significant for breast cancer, COPD, HLD, HTN, neuropathy, and tobacco abuse, presented by EMS with AMS that onset earlier today. Patient was discharged earlier today s/p left mastectomy, although Dr. Arnoldo Morale advised her to stay. While in route, she was noted to have an oxygen saturation of 72%, in which she was placed on an NRB mask with improvement. She has associated post-op pain of the chest wall. She denies any fever or chills. She has been started on a nasal cannula and  IV fluids. Work up included BNP of 205, WBC of 11K, troponin of 0.05.  She did not have any chest pain, orthopnea, coughs, fever or chills.  Hospitalist was asked to admit the patient for acute respiratory failure with hypoxia and altered mental status.   ED Course: As above.   Review of Systems: As per HPI otherwise 10 point review of systems negative.    Past Medical History:  Diagnosis Date  . Anxiety   . Breast CA ([place]) 07/01/2010   rt breast ca  . COPD (chronic obstructive pulmonary disease) ([place])   . Family history of breast cancer    Aunt on father's side  . Hyperlipemia   . Hypertension   . MRSA (methicillin resistant staph aureus) culture positive    in breast  . Neuropathy ([place])    bilateral toes  . Nipple discharge    with pain, infection, lump  . S/P radiation therapy 01/08/11 - 02/22/11   Right Chest Wall/ 50 Gy / 25 Fractions, Right [place]/ 50 gy/25 Fractions, Right Axillary Boost/500 cGy/25 Fractions, Right Chest Wall Boost/10 Gy/5 Fractions  . Status post chemotherapy    6 cycles of carboplatin and docetaxel with trastuzumab every 21 days followed by surgery  . Tobacco abuse 06/22/2015  . Use of letrozole (Femara) 03/09/2011  . Wears dentures     Past Surgical  History:  Procedure Laterality Date  . BREAST SURGERY  2012  . DILATION AND CURETTAGE OF UTERUS    . port a catheter insertion    . PORT-A-CATH REMOVAL Left 01/15/2013   Procedure: REMOVAL PORT-A-CATH;  Surgeon: Scherry Ran, MD;  Location: [facility];  Service: General;  Laterality: Left;  . TUBAL LIGATION       reports that she has been smoking Cigarettes.  She has a 7.50 pack-year smoking history. She has never used smokeless tobacco. She reports that she does not drink alcohol or use drugs.  No Known Allergies  Family History  Problem Relation Age of Onset  . COPD Mother   . Diabetes Father   . Hypertension Father   . COPD Brother   . Cancer Maternal Aunt     ovarian - died of old age  . Cancer Paternal Aunt     had breast ca; died of of a different cancer  . Cancer Brother     Prior to Admission medications   Medication Sig Start Date End Date Taking? Authorizing Provider  budesonide-formoterol (SYMBICORT) 160-4.5 MCG/ACT inhaler Inhale 2 puffs into the lungs 2 (two) times daily. 04/08/15   [name], FNP  Cholecalciferol (VITAMIN D-3 PO) Take 400 Units by mouth daily. 400 units a day    Historical Provider, MD  clobetasol cream (TEMOVATE) 0.05 % Apply  1 application topically as needed. rash 10/07/15   Terald Sleeper, PA-C  fexofenadine (ALLEGRA) 180 MG tablet Take 180 mg by mouth daily.      Historical Provider, MD  Ginkgo Biloba 40 MG TABS Take 1 tablet by mouth daily.    Historical Provider, MD  letrozole (FEMARA) 2.5 MG tablet Take 1 tablet (2.5 mg total) by mouth daily. 11/29/14   Baird Cancer, PA-C  LORazepam (ATIVAN) 0.5 MG tablet TAKE ONE TABLET BY MOUTH EVERY 8 HOURS AS NEEDED Patient taking differently: TAKE ONE TABLET BY MOUTH EVERY 8 HOURS AS NEEDED FOR ANXIETY 11/16/15   Terald Sleeper, PA-C  losartan-hydrochlorothiazide (HYZAAR) 100-12.5 MG tablet Take 1 tablet by mouth daily. 10/07/15   Terald Sleeper, PA-C  Multiple Vitamin (MULTIVITAMIN) capsule Take 1  capsule by mouth daily.      Historical Provider, MD  Multiple Vitamins-Minerals (EMERGEN-C IMMUNE PO) Take 1 tablet by mouth daily.    Historical Provider, MD  naproxen sodium (ANAPROX) 220 MG tablet Take 220 mg by mouth as needed. pain    Historical Provider, MD  omeprazole (PRILOSEC) 40 MG capsule Take 1 capsule (40 mg total) by mouth daily. 10/12/14   [name], FNP  ondansetron (ZOFRAN ODT) 4 MG disintegrating tablet Take 1 tablet (4 mg total) by mouth every 8 (eight) hours as needed for nausea or vomiting. 12/18/15   Aviva Signs, MD  potassium chloride SA (K-DUR,KLOR-CON) 20 MEQ tablet Take 1 tablet (20 mEq total) by mouth 2 (two) times daily. 10/07/15 10/06/16  Terald Sleeper, PA-C  rosuvastatin (CRESTOR) 20 MG tablet Take 1 tablet (20 mg total) by mouth daily. 10/07/15   Terald Sleeper, PA-C    Physical Exam: Vitals:   12/18/15 2113 12/18/15 2130 12/18/15 2200 12/18/15 2226  BP: 123/64 120/64 130/69 130/69  Pulse: 97 99 99 97  Resp: 21 18 21 20   Temp:      TempSrc:      SpO2: 98% 98% 96% 94%  Weight:      Height:       Constitutional: NAD, calm, comfortable Vitals:   12/18/15 2113 12/18/15 2130 12/18/15 2200 12/18/15 2226  BP: 123/64 120/64 130/69 130/69  Pulse: 97 99 99 97  Resp: 21 18 21 20   Temp:      TempSrc:      SpO2: 98% 98% 96% 94%  Weight:      Height:       Eyes: PERRL, lids and conjunctivae normal ENMT: Mucous membranes are moist. Posterior pharynx clear of any exudate or lesions.Normal dentition.  Neck: normal, supple, no masses, no thyromegaly Respiratory: clear to auscultation bilaterally, no wheezing, no crackles. Normal respiratory effort. No accessory muscle use.  Cardiovascular: Regular rate and rhythm, no murmurs / rubs / gallops. No extremity edema. 2+ pedal pulses. No carotid bruits.  Abdomen: no tenderness, no masses palpated. No hepatosplenomegaly. Bowel sounds positive.  Musculoskeletal: no clubbing / cyanosis. No joint deformity upper and lower  extremities. Good ROM, no contractures. Normal muscle tone.  Skin: no rashes, lesions, ulcers. No induration Neurologic: CN 2-12 grossly intact. Sensation intact, DTR normal. Strength 5/5 in all 4.  Psychiatric: Normal judgment and insight. Alert and oriented x 3. Normal mood.    Labs on Admission: I have personally reviewed following labs and imaging studies  Recent Labs Lab 12/13/15 1419 12/17/15 0731 12/18/15 0714 12/18/15 2030  WBC 9.7 11.4* 11.7* 11.3*  NEUTROABS 6.6  --   --  9.2*  HGB 15.8* 14.0 13.1 13.0  HCT 47.4* 45.4 43.3 41.3  MCV 94.0 99.1 99.8 97.9  PLT 235 204 192 198    Recent Labs Lab 12/13/15 1419 12/17/15 0731 12/18/15 0714 12/18/15 2030  NA 135 137 138 137  K 3.8 4.2 4.5 4.2  CL 99* 99* 98* 96*  CO2 27 31 34* 34*  GLUCOSE 100* 109* 107* 118*  BUN 14 11 10 12   CREATININE 0.48 0.50 0.45 0.53  CALCIUM 9.1 8.5* 9.2 9.3    Recent Labs Lab 12/13/15 1419  AST 36  ALT 21  ALKPHOS 89  BILITOT 0.3  PROT 7.1  ALBUMIN 4.1    Recent Labs Lab 12/18/15 2030  TROPONINI 0.05*      Component Value Date/Time   COLORURINE YELLOW 11/20/2010 0910   APPEARANCEUR CLEAR 11/20/2010 0910   LABSPEC 1.007 11/20/2010 0910   PHURINE 7.5 11/20/2010 0910   GLUCOSEU NEGATIVE 11/20/2010 0910   HGBUR NEGATIVE 11/20/2010 0910   BILIRUBINUR NEGATIVE 11/20/2010 0910   KETONESUR NEGATIVE 11/20/2010 0910   PROTEINUR NEGATIVE 11/20/2010 0910   UROBILINOGEN 0.2 11/20/2010 0910   NITRITE NEGATIVE 11/20/2010 0910   LEUKOCYTESUR NEGATIVE 11/20/2010 0910     Radiological Exams on Admission: Dg Chest Port 1 View  Result Date: 12/18/2015 CLINICAL DATA:  Acute onset of shortness of breath and altered level of consciousness. Decreased O2 saturation. EXAM: PORTABLE CHEST 1 VIEW COMPARISON:  Chest radiograph performed 12/13/2015 FINDINGS: Vascular congestion is noted. Increased interstitial markings may reflect mild interstitial edema. A small right pleural effusion is  suspected. No pneumothorax is seen. The cardiomediastinal silhouette is mildly enlarged. Left-sided skin staples are noted. Clips are seen at the right axilla. No acute osseous abnormalities are seen. IMPRESSION: Vascular congestion and mild cardiomegaly. Increased interstitial markings may reflect mild interstitial edema. Small right pleural effusion suspected. Electronically Signed   By: Garald Balding M.D.   On: 12/18/2015 20:12    EKG: Independently reviewed. EKG shows sinus tachycardia.   Assessment/Plan Principal Problem:   COPD (chronic obstructive pulmonary disease) (HCC) Active Problems:   Hypertension   Hyperlipidemia   Altered mental status  1. Acute Encephalopathy: suspect from hypoxia due to respiratory failure. Patient was short of breath PTA, she was placed on an NRB. Continue nebulizer and supplemental oxygen, wean as tolerated.  Hold off on steroid as she is not wheezing significantly and she just had surgery. Will need to r/out PE.  2. COPD exacerbation. Continue supplemental oxygen. Will continue with negs.  3. Stage 3 Right breast cancer. S/p mastectomy of both breast. Patient was discharged earlier today. In which, Dr. Arnoldo Morale advised her not to go home as she is not ready. Will need to R/o PE.  4. Elevated troponin. Patient has a troponin of 0.05. Trend troponin Continue to monitor, but I don't think she had an ACS.  5. Leukocytosis. Patient has an elevated WBC of 11.3.  6. HTN. Pressures are stable. Continue losartan.     DVT prophylaxis: SQ heparin  Code Status: FULL  Family Communication: Husband bedside  Disposition Plan: Discharge home once improved.  Consults called: None  Admission status: Inpatient    Orvan Falconer, MD FACP [facility] If 7PM-7AM, please contact night-coverage [email or web address] Password [facility]  12/18/2015, 11:39 PM    By signing my name below, I, Collene Leyden, attest that this documentation has been prepared under the direction and in  the presence of Orvan Falconer, MD. Electronically signed: Collene Leyden, Scribe. 12/18/15

## 2015-12-18 NOTE — ED Notes (Signed)
CRITICAL VALUE ALERT  Critical value received:  Troponin 0.05  Date of notification:  12/18/2015  Time of notification:  2133  Critical value read back:Yes.    Nurse who received alert:  Fabio Neighbors RN  MD notified (1st page):  Dr. Thurnell Garbe  Time of first page:  2135  MD notified (2nd page):  Time of second page:  Responding MD:    Time MD responded:

## 2015-12-18 NOTE — Discharge Instructions (Signed)
Bulb Drain Home Care °A bulb drain consists of a thin rubber tube and a soft, round bulb that creates a gentle suction. The rubber tube is placed in the area where you had surgery. A bulb is attached to the end of the tube that is outside the body. The bulb drain removes excess fluid that normally builds up in a surgical wound after surgery. The color and amount of fluid will vary. Immediately after surgery, the fluid is bright red and is a little thicker than water. It may gradually change to a yellow or pink color and become more thin and water-like. When the amount decreases to about 1 or 2 tbsp in 24 hours, your health care provider will usually remove it. °DAILY CARE °· Keep the bulb flat (compressed) at all times, except while emptying it. The flatness creates suction. You can flatten the bulb by squeezing it firmly in the middle and then closing the cap. °· Keep sites where the tube enters the skin dry and covered with a bandage (dressing). °· Secure the tube 1-2 in (2.5-5.1 cm) below the insertion sites to keep it from pulling on your stitches. The tube is stitched in place and will not slip out. °· Secure the bulb as directed by your health care provider. °· For the first 3 days after surgery, there usually is more fluid in the bulb. Empty the bulb whenever it becomes half full because the bulb does not create enough suction if it is too full. The bulb could also overflow. Write down how much fluid you remove each time you empty your drain. Add up the amount removed in 24 hours. °· Empty the bulb at the same time every day once the amount of fluid decreases and you only need to empty it once a day. Write down the amounts and the 24-hour totals to give to your health care provider. This helps your health care provider know when the tubes can be removed. °EMPTYING THE BULB DRAIN °Before emptying the bulb, get a measuring cup, a piece of paper and a pen, and wash your hands. °· Gently run your fingers down the  tube (stripping) to empty any drainage from the tubing into the bulb. This may need to be done several times a day to clear the tubing of clots and tissue. °· Open the bulb cap to release suction, which causes it to inflate. Do not touch the inside of the cap. °· Gently run your fingers down the tube (stripping) to empty any drainage from the tubing into the bulb. °· Hold the cap out of the way, and pour fluid into the measuring cup.   °· Squeeze the bulb to provide suction.  °· Replace the cap.   °· Check the tape that holds the tube to your skin. If it is becoming loose, you can remove the loose piece of tape and apply a new one. Then, pin the bulb to your shirt.   °· Write down the amount of fluid you emptied out. Write down the date and each time you emptied your bulb drain. (If there are 2 bulbs, note the amount of drainage from each bulb and keep the totals separate. Your health care provider will want to know the total amounts for each drain and which tube is draining more.)   °· Flush the fluid down the toilet and wash your hands.   °· Call your health care provider once you have less than 2 tbsp of fluid collecting in the bulb drain every 24 hours. °If   there is drainage around the tube site, change dressings and keep the area dry. Cleanse around tube with sterile saline and place dry gauze around site. This gauze should be changed when it is soiled. If it stays clean and unsoiled, it should still be changed daily.  °SEEK MEDICAL CARE IF: °· Your drainage has a bad smell or is cloudy.   °· You have a fever.   °· Your drainage is increasing instead of decreasing.   °· Your tube fell out.   °· You have redness or swelling around the tube site.   °· You have drainage from a surgical wound.   °· Your bulb drain will not stay flat after you empty it.   °MAKE SURE YOU:  °· Understand these instructions. °· Will watch your condition. °· Will get help right away if you are not doing well or get worse. °  °This  information is not intended to replace advice given to you by your health care provider. Make sure you discuss any questions you have with your health care provider. °  °Document Released: 01/20/2000 Document Revised: 02/12/2014 Document Reviewed: 08/11/2014 °Elsevier Interactive Patient Education ©2016 [employer] °Total or Modified Radical Mastectomy, Care After °Refer to this sheet in the next few weeks. These instructions provide you with information about caring for yourself after your procedure. Your health care provider may also give you more specific instructions. Your treatment has been planned according to current medical practices, but problems sometimes occur. Call your health care provider if you have any problems or questions after your procedure. °WHAT TO EXPECT AFTER THE PROCEDURE °After your procedure, it is common to have: °· Pain. °· Numbness. °· Stiffness in your arm or shoulder. °· Feelings of stress, sadness, or depression. °If the lymph nodes under your arm were removed, you may have arm swelling, weakness, or numbness on the same side of your body as your surgery. °HOME CARE INSTRUCTIONS °Incision Care °· There are many different ways to close and cover an incision, including stitches, skin glue, and adhesive strips. Follow your health care provider's instructions about: °· Incision care. °· Bandage (dressing) changes and removal. °· Incision closure removal. °· Check your incision area every day for signs of infection. Watch for: °· Redness, swelling, or pain. °· Fluid, blood, or pus. °· If you were sent home with a surgical drain in place, follow your health care provider's instructions for emptying it. °Bathing °· Do not take baths, swim, or use a hot tub until your health care provider approves. °· Take sponge baths until your health care provider says that you can start showering or bathing. °Activities °· Return to your normal activities as directed by your health care  provider. °· Avoid strenuous exercise. °· Be careful to avoid any activities that could cause an injury to your arm on the side of your surgery. °· Do not lift anything that is heavier than 10 lb (4.5 kg). Avoid lifting with the arm that is on the side of your surgery. °· Do not carry heavy objects on your shoulder. °· After your drain is removed, you should perform exercises to keep your arm from getting stiff and swollen. Talk with your health care provider about which exercises are safe for you. °General Instructions °· Take medicines only as directed by your health care provider. °· You may eat what you usually do. °· Keep your arm elevated when at rest. °· Do not wear tight jewelry on your arm, wrist, or fingers on the side of your surgery. °·   If you had a modified radical mastectomy, always let your health care providers know that lymph nodes under your arm were removed. This is important information to share before you are involved in certain procedures, such as giving blood or having your blood pressure taken. °SEEK MEDICAL CARE IF: °· You have a fever. °· Your pain medicine is not working. °· Your arm swelling, weakness, or numbness has not improved after a few weeks. °· You have new swelling in your breast or arm. °· You have redness, swelling, or pain in your incision area. °· You have fluid, blood, or pus coming from your incision. °SEEK IMMEDIATE MEDICAL CARE IF: °· You have very bad pain in your breast or arm. °· You have chest pain. °· You have difficulty breathing. °  °This information is not intended to replace advice given to you by your health care provider. Make sure you discuss any questions you have with your health care provider. °  °Document Released: 09/15/2003 Document Revised: 02/12/2014 Document Reviewed: 10/07/2013 °Elsevier Interactive Patient Education ©2016 [employer] ° °

## 2015-12-18 NOTE — ED Notes (Signed)
Report given to Valentino Nose, RN on Dept 300, all questions answered

## 2015-12-18 NOTE — ED Notes (Signed)
Unable to get temp pt getting breathing treatment,

## 2015-12-18 NOTE — ED Triage Notes (Signed)
rcesm called out for altered loc, when first responders arrived pt's O2 sat was 72, pt was placed on NRB and sats increased to the 90's; family told ems that pt was not acting like her normal self; pt states she doesn't remember much of today; pt was discharged from hospital today for recent left mastectomy

## 2015-12-19 ENCOUNTER — Encounter (HOSPITAL_COMMUNITY): Payer: Self-pay | Admitting: *Deleted

## 2015-12-19 DIAGNOSIS — J441 Chronic obstructive pulmonary disease with (acute) exacerbation: Principal | ICD-10-CM

## 2015-12-19 DIAGNOSIS — R748 Abnormal levels of other serum enzymes: Secondary | ICD-10-CM | POA: Diagnosis not present

## 2015-12-19 DIAGNOSIS — R778 Other specified abnormalities of plasma proteins: Secondary | ICD-10-CM

## 2015-12-19 DIAGNOSIS — R7989 Other specified abnormal findings of blood chemistry: Secondary | ICD-10-CM

## 2015-12-19 LAB — BASIC METABOLIC PANEL
ANION GAP: 10 (ref 5–15)
BUN: 12 mg/dL (ref 6–20)
CHLORIDE: 97 mmol/L — AB (ref 101–111)
CO2: 33 mmol/L — AB (ref 22–32)
CREATININE: 0.44 mg/dL (ref 0.44–1.00)
Calcium: 9.1 mg/dL (ref 8.9–10.3)
GFR calc non Af Amer: >60 mL/min (ref >60)
Glucose, Bld: 110 mg/dL — ABNORMAL HIGH (ref 65–99)
Potassium: 3.9 mmol/L (ref 3.5–5.1)
Sodium: 140 mmol/L (ref 135–145)

## 2015-12-19 LAB — URINE MICROSCOPIC-ADD ON
BACTERIA UA: NONE SEEN
WBC UA: NONE SEEN WBC/hpf (ref 0–5)

## 2015-12-19 LAB — CBC
HCT: 39.9 % (ref 36.0–46.0)
HEMOGLOBIN: 12.5 g/dL (ref 12.0–15.0)
MCH: 30.3 pg (ref 26.0–34.0)
MCHC: 31.3 g/dL (ref 30.0–36.0)
MCV: 96.8 fL (ref 78.0–100.0)
Platelets: 203 10*3/uL (ref 150–400)
RBC: 4.12 MIL/uL (ref 3.87–5.11)
RDW: 14.8 % (ref 11.5–15.5)
WBC: 11.1 10*3/uL — ABNORMAL HIGH (ref 4.0–10.5)

## 2015-12-19 LAB — URINALYSIS, ROUTINE W REFLEX MICROSCOPIC
BILIRUBIN URINE: NEGATIVE
Glucose, UA: NEGATIVE mg/dL
Ketones, ur: 15 mg/dL — AB
Leukocytes, UA: NEGATIVE
Nitrite: NEGATIVE
PH: 5.5 (ref 5.0–8.0)
Protein, ur: NEGATIVE mg/dL

## 2015-12-19 LAB — TSH: TSH: 0.131 u[IU]/mL — ABNORMAL LOW (ref 0.350–4.500)

## 2015-12-19 LAB — TROPONIN I
TROPONIN I: 0.1 ng/mL — AB (ref <0.03)
Troponin I: 0.11 ng/mL (ref <0.03)
Troponin I: 0.07 ng/mL (ref <0.03)

## 2015-12-19 LAB — LACTIC ACID, PLASMA: Lactic Acid, Venous: 0.8 mmol/L (ref 0.5–1.9)

## 2015-12-19 MED ORDER — SODIUM CHLORIDE 0.9% FLUSH
3.0000 mL | Freq: Two times a day (BID) | INTRAVENOUS | Status: DC
  Administered 2015-12-19 – 2015-12-21 (×6): 3 mL via INTRAVENOUS

## 2015-12-19 MED ORDER — LOSARTAN POTASSIUM-HCTZ 100-12.5 MG PO TABS: 1.0000 | ORAL_TABLET | Freq: Every day | ORAL | Status: DC

## 2015-12-19 MED ORDER — LETROZOLE 2.5 MG PO TABS
2.5000 mg | ORAL_TABLET | Freq: Every day | ORAL | Status: DC
  Administered 2015-12-19 – 2015-12-21 (×3): 2.5 mg via ORAL
  Filled 2015-12-19 (×5): qty 1

## 2015-12-19 MED ORDER — ROSUVASTATIN CALCIUM 20 MG PO TABS
20.0000 mg | ORAL_TABLET | Freq: Every day | ORAL | Status: DC
  Administered 2015-12-19 – 2015-12-21 (×3): 20 mg via ORAL
  Filled 2015-12-19 (×3): qty 1

## 2015-12-19 MED ORDER — FUROSEMIDE 10 MG/ML IJ SOLN
40.0000 mg | Freq: Once | INTRAMUSCULAR | Status: AC
  Administered 2015-12-19: 40 mg via INTRAVENOUS
  Filled 2015-12-19: qty 4

## 2015-12-19 MED ORDER — MOMETASONE FURO-FORMOTEROL FUM 200-5 MCG/ACT IN AERO
2.0000 | INHALATION_SPRAY | Freq: Two times a day (BID) | RESPIRATORY_TRACT | Status: DC
  Administered 2015-12-19 – 2015-12-21 (×5): 2 via RESPIRATORY_TRACT
  Filled 2015-12-19: qty 8.8

## 2015-12-19 MED ORDER — HEPARIN SODIUM (PORCINE) 5000 UNIT/ML IJ SOLN
5000.0000 [IU] | Freq: Three times a day (TID) | INTRAMUSCULAR | Status: DC
  Administered 2015-12-19 – 2015-12-21 (×6): 5000 [IU] via SUBCUTANEOUS
  Filled 2015-12-19 (×6): qty 1

## 2015-12-19 MED ORDER — ACETAMINOPHEN 650 MG RE SUPP: 650.0000 mg | Freq: Four times a day (QID) | RECTAL | Status: DC | PRN

## 2015-12-19 MED ORDER — DOXYCYCLINE HYCLATE 100 MG PO TABS
100.0000 mg | ORAL_TABLET | Freq: Two times a day (BID) | ORAL | Status: DC
  Administered 2015-12-19 – 2015-12-21 (×5): 100 mg via ORAL
  Filled 2015-12-19 (×5): qty 1

## 2015-12-19 MED ORDER — LOSARTAN POTASSIUM 50 MG PO TABS
100.0000 mg | ORAL_TABLET | Freq: Every day | ORAL | Status: DC
  Administered 2015-12-19 – 2015-12-21 (×3): 100 mg via ORAL
  Filled 2015-12-19 (×3): qty 2

## 2015-12-19 MED ORDER — ONDANSETRON HCL 4 MG/2ML IJ SOLN: 4.0000 mg | Freq: Four times a day (QID) | INTRAMUSCULAR | Status: DC | PRN

## 2015-12-19 MED ORDER — ALBUTEROL SULFATE (2.5 MG/3ML) 0.083% IN NEBU
2.5000 mg | INHALATION_SOLUTION | RESPIRATORY_TRACT | Status: DC | PRN
  Administered 2015-12-19: 2.5 mg via RESPIRATORY_TRACT

## 2015-12-19 MED ORDER — ALBUTEROL SULFATE (2.5 MG/3ML) 0.083% IN NEBU
2.5000 mg | INHALATION_SOLUTION | Freq: Four times a day (QID) | RESPIRATORY_TRACT | Status: DC
  Administered 2015-12-19 – 2015-12-21 (×10): 2.5 mg via RESPIRATORY_TRACT
  Filled 2015-12-19 (×11): qty 3

## 2015-12-19 MED ORDER — PANTOPRAZOLE SODIUM 40 MG PO TBEC
40.0000 mg | DELAYED_RELEASE_TABLET | Freq: Every day | ORAL | Status: DC
  Administered 2015-12-19 – 2015-12-21 (×3): 40 mg via ORAL
  Filled 2015-12-19 (×3): qty 1

## 2015-12-19 MED ORDER — HYDROCHLOROTHIAZIDE 12.5 MG PO CAPS
12.5000 mg | ORAL_CAPSULE | Freq: Every day | ORAL | Status: DC
  Administered 2015-12-19 – 2015-12-20 (×2): 12.5 mg via ORAL
  Filled 2015-12-19 (×2): qty 1

## 2015-12-19 MED ORDER — ACETAMINOPHEN 325 MG PO TABS: 650.0000 mg | ORAL_TABLET | Freq: Four times a day (QID) | ORAL | Status: DC | PRN

## 2015-12-19 MED ORDER — HYDROMORPHONE HCL 1 MG/ML IJ SOLN: 0.5000 mg | INTRAMUSCULAR | Status: DC | PRN

## 2015-12-19 MED ORDER — ONDANSETRON HCL 4 MG PO TABS: 4.0000 mg | ORAL_TABLET | Freq: Four times a day (QID) | ORAL | Status: DC | PRN

## 2015-12-19 NOTE — Care Management Note (Signed)
Case Management Note  Patient Details  Name: Sheila Garrison MRN: [id number] Date of Birth: 02-13-1955  Subjective/Objective:                  Pt admitted with COPD. Pt is from home, lives with her husband and is ind with ADL's. She is ind with ADL's. She has PCP, drives herself to appointments and has no difficulty affording or managing her medications. She uses inhalers PTA but does not have neb or supplemental oxygen at home. She plans to return home with self care at DC.   Action/Plan: Will cont to follow for DC planning.   Expected Discharge Date:    12/21/2015              Expected Discharge Plan:  Home/Self Care  In-House Referral:  NA  Discharge planning Services  CM Consult  Post Acute Care Choice:  NA Choice offered to:  NA  Status of Service:  In process, will continue to follow  Sherald Barge, RN 12/19/2015, 1:51 PM

## 2015-12-19 NOTE — Progress Notes (Signed)
Dr. Wendee Beavers paged and made aware of patients troponin 0.11 via text page.

## 2015-12-19 NOTE — Progress Notes (Signed)
Readmission for acute exacerbation of COPD noted. Appreciate hospitalist care. Will follow from surgical standpoint.

## 2015-12-19 NOTE — Progress Notes (Signed)
PROGRESS NOTE    Sheila Garrison  [id number] DOB: [birth year] DOA: 12/18/2015 PCP: [name], FNP    Brief Narrative:   60 y.o. female with a past medical history significant for breast cancer, COPD, HLD, HTN, neuropathy, and tobacco abuse, presented by EMS with AMS that onset earlier today. Patient was discharged earlier today s/p left mastectomy, although Dr. Arnoldo Morale advised her to stay. While in route, she was noted to have an oxygen saturation of 72%, in which she was placed on an NRB mask with improvement.   Pt diagnosed with copd exacerbation   Assessment & Plan:   Principal Problem:   COPD (chronic obstructive pulmonary disease) ([place]) - continue current medication regimen. - doxycycline added for copd exacerbation  Active Problems: Elevated troponin - will place on consult to Cardiology to see if further testing is required - No chest pain reports. Recent operation - obtain 12 lead EKG    Hypertension -stable continue current regimen.    Hyperlipidemia - stable continue statin    Altered mental status - was from hypoxia. Has resolved  DVT prophylaxis: heparin Code Status: Full Family Communication: None Disposition Plan: Pending improvement in respiratory condition   Consultants:   General surgery    Procedures: None   Antimicrobials: will start doxycycline   Subjective: Pt has no new complaints breathing better today.  Objective: Vitals:   12/19/15 0411 12/19/15 0604 12/19/15 0722 12/19/15 1107  BP:  (!) 126/53    Pulse:  100    Resp:  20    Temp:  98.3 F (36.8 C)    TempSrc:  Oral    SpO2: (!) 85% 92% 95% (!) 89%  Weight:      Height:        Intake/Output Summary (Last 24 hours) at 12/19/15 1333 Last data filed at 12/19/15 0800  Gross per 24 hour  Intake                0 ml  Output              415 ml  Net             -415 ml   Filed Weights   12/18/15 1937 12/19/15 0030  Weight: 69.9 kg (154 lb) 70.5 kg (155 lb 6.4 oz)      Examination:  General exam: Appears calm and comfortable  Respiratory system: prolonged exp phase, no wheezes, equal chest rise, no accessory muscle use. Cardiovascular system: S1 & S2 heard, RRR. No JVD, murmurs, rubs, gallops or clicks. Gastrointestinal system: Abdomen is nondistended, soft and nontender. No organomegaly or masses felt. Normal bowel sounds heard. Central nervous system: Alert and oriented. No focal neurological deficits. Extremities: Symmetric 5 x 5 power. Skin: No rashes, lesions or ulcers Psychiatry: Judgement and insight appear normal. Mood & affect appropriate.   Data Reviewed: I have personally reviewed following labs and imaging studies  CBC:  Recent Labs Lab 12/13/15 1419 12/17/15 0731 12/18/15 0714 12/18/15 2030 12/19/15 0652  WBC 9.7 11.4* 11.7* 11.3* 11.1*  NEUTROABS 6.6  --   --  9.2*  --   HGB 15.8* 14.0 13.1 13.0 12.5  HCT 47.4* 45.4 43.3 41.3 39.9  MCV 94.0 99.1 99.8 97.9 96.8  PLT 235 204 192 198 [id number]   Basic Metabolic Panel:  Recent Labs Lab 12/13/15 1419 12/17/15 0731 12/18/15 0714 12/18/15 2030 12/19/15 0652  NA 135 137 138 137 140  K 3.8 4.2 4.5 4.2 3.9  CL 99* 99*  98* 96* 97*  CO2 27 31 34* 34* 33*  GLUCOSE 100* 109* 107* 118* 110*  BUN 14 11 10 12 12   CREATININE 0.48 0.50 0.45 0.53 0.44  CALCIUM 9.1 8.5* 9.2 9.3 9.1   GFR: Estimated Creatinine Clearance: 70.2 mL/min (by C-G formula based on SCr of 0.44 mg/dL). Liver Function Tests:  Recent Labs Lab 12/13/15 1419  AST 36  ALT 21  ALKPHOS 89  BILITOT 0.3  PROT 7.1  ALBUMIN 4.1   No results for input(s): LIPASE, AMYLASE in the last 168 hours. No results for input(s): AMMONIA in the last 168 hours. Coagulation Profile: No results for input(s): INR, PROTIME in the last 168 hours. Cardiac Enzymes:  Recent Labs Lab 12/18/15 2030 12/19/15 0113 12/19/15 0652  TROPONINI 0.05* 0.10* 0.11*   BNP (last 3 results) No results for input(s): PROBNP in the last 8760  hours. HbA1C: No results for input(s): HGBA1C in the last 72 hours. CBG: No results for input(s): GLUCAP in the last 168 hours. Lipid Profile: No results for input(s): CHOL, HDL, LDLCALC, TRIG, CHOLHDL, LDLDIRECT in the last 72 hours. Thyroid Function Tests:  Recent Labs  12/19/15 0113  TSH 0.131*   Anemia Panel: No results for input(s): VITAMINB12, FOLATE, FERRITIN, TIBC, IRON, RETICCTPCT in the last 72 hours. Sepsis Labs:  Recent Labs Lab 12/18/15 2030 12/18/15 2244  LATICACIDVEN 0.9 0.8    No results found for this or any previous visit (from the past 240 hour(s)).       Radiology Studies: Ct Angio Chest Pe W Or Wo Contrast  Result Date: 12/19/2015 CLINICAL DATA:  Acute onset of shortness of breath and decreased O2 saturation. Altered mental status. Initial encounter. EXAM: CT ANGIOGRAPHY CHEST WITH CONTRAST TECHNIQUE: Multidetector CT imaging of the chest was performed using the standard protocol during bolus administration of intravenous contrast. Multiplanar CT image reconstructions and MIPs were obtained to evaluate the vascular anatomy. CONTRAST:  100 mL of Isovue 370 IV contrast COMPARISON:  CT of the chest performed 07/05/2010 FINDINGS: Cardiovascular:  There is no evidence of pulmonary embolus. The heart is unremarkable in appearance. The thoracic aorta is grossly intact, with mild calcification along the aortic arch and proximal great vessels. Mediastinum/Nodes: Scattered coronary artery calcifications are seen. No mediastinal lymphadenopathy is appreciated. No pericardial effusion is identified. The thyroid gland is unremarkable. No axillary lymphadenopathy is seen. Postoperative soft tissue air is noted at the left axilla. Lungs/Pleura: A small right pleural effusion is noted, with associated atelectasis. Peripheral scarring is noted at the anterior right upper lobe. Mild bilateral emphysema is noted. No dominant mass is seen. There is no evidence of pneumothorax.  Upper Abdomen: The visualized portions of the liver and spleen are grossly unremarkable. The visualized portions of the pancreas, adrenal glands and kidneys are unremarkable. Musculoskeletal: No acute osseous abnormalities are identified. Vague sclerotic change and heterogeneity at vertebral body T8 raises concern for sequelae of metastatic disease. The visualized musculature is unremarkable in appearance. The patient is status post bilateral mastectomy, with skin staples noted on the left. Vague collections of fluid along the left chest wall are likely postoperative in nature. An associated drain is noted along the left chest wall. Review of the MIP images confirms the above findings. IMPRESSION: 1. No evidence of pulmonary embolus. 2. Small right pleural effusion, with associated atelectasis. Peripheral scarring at the anterior right upper lobe. Mild bilateral emphysema noted. 3. Scattered coronary artery calcifications seen. 4. Postoperative change at the left chest wall, reflecting recent mastectomy,  with vague collections of postoperative fluid seen. 5. Vague sclerotic change and heterogeneity at vertebral body T8 raises concern for sequelae of metastatic disease. Would correlate with the patient's history. Electronically Signed   By: Garald Balding M.D.   On: 12/19/2015 00:17   Dg Chest Port 1 View  Result Date: 12/18/2015 CLINICAL DATA:  Acute onset of shortness of breath and altered level of consciousness. Decreased O2 saturation. EXAM: PORTABLE CHEST 1 VIEW COMPARISON:  Chest radiograph performed 12/13/2015 FINDINGS: Vascular congestion is noted. Increased interstitial markings may reflect mild interstitial edema. A small right pleural effusion is suspected. No pneumothorax is seen. The cardiomediastinal silhouette is mildly enlarged. Left-sided skin staples are noted. Clips are seen at the right axilla. No acute osseous abnormalities are seen. IMPRESSION: Vascular congestion and mild cardiomegaly.  Increased interstitial markings may reflect mild interstitial edema. Small right pleural effusion suspected. Electronically Signed   By: Garald Balding M.D.   On: 12/18/2015 20:12        Scheduled Meds: . albuterol  2.5 mg Nebulization Q6H  . heparin  5,000 Units Subcutaneous Q8H  . losartan  100 mg Oral Daily   And  . hydrochlorothiazide  12.5 mg Oral Daily  . letrozole  2.5 mg Oral Daily  . mometasone-formoterol  2 puff Inhalation BID  . pantoprazole  40 mg Oral Daily  . rosuvastatin  20 mg Oral Daily  . sodium chloride flush  3 mL Intravenous Q12H   Continuous Infusions:   LOS: 0 days    Time spent: > 35   Velvet Bathe, MD [facility] Pager [phone]  If 7PM-7AM, please contact night-coverage [email or web address] Password [email or web address] 12/19/2015, 1:33 PM

## 2015-12-19 NOTE — Consult Note (Signed)
Primary Physician: Primary Cardiologist:  New    HPI:  Pt is a 60 yo with hsitory of COPD, HL, HTN, breat CA, (s/p chemo therapy, XRT)  Now s/p modified radical mastectomy on 11/10  Just discharged yesterday  She reperesented to [place] with [place] in EMS 72%  Chemtherapy  In 2012/2013 was with carboplatin, docetaxel and trastuzumab   Echo in 2013 LVEF was 60 to 65%  She has not had an echo since  She says that she always has some SOB  Continues to work No PND  No CP  No edema Had surgery on Friday  Sunday sent home  Pt's husband said she didn't look good at D/C  [place] home at 2 PM  Called EMS at 7 PM        Past Medical History:  Diagnosis Date  . Anxiety   . Breast CA ([place]) 07/01/2010   rt breast ca  . COPD (chronic obstructive pulmonary disease) ([place])   . Family history of breast cancer    Aunt on father's side  . Hyperlipemia   . Hypertension   . MRSA (methicillin resistant staph aureus) culture positive    in breast  . Neuropathy ([place])    bilateral toes  . Nipple discharge    with pain, infection, lump  . S/P radiation therapy 01/08/11 - 02/22/11   Right Chest Wall/ 50 Gy / 25 Fractions, Right [place]/ 50 gy/25 Fractions, Right Axillary Boost/500 cGy/25 Fractions, Right Chest Wall Boost/10 Gy/5 Fractions  . Status post chemotherapy    6 cycles of carboplatin and docetaxel with trastuzumab every 21 days followed by surgery  . Tobacco abuse 06/22/2015  . Use of letrozole (Femara) 03/09/2011  . Wears dentures     Medications Prior to Admission  Medication Sig Dispense Refill  . budesonide-formoterol (SYMBICORT) 160-4.5 MCG/ACT inhaler Inhale 2 puffs into the lungs 2 (two) times daily. 1 Inhaler 3  . Cholecalciferol (VITAMIN D-3 PO) Take 400 Units by mouth daily. 400 units a day    . clobetasol cream (TEMOVATE) [id number] % Apply 1 application topically as needed. rash 60 g 2  . fexofenadine (ALLEGRA) 180 MG tablet Take 180 mg by mouth daily.      . Ginkgo Biloba 40 MG TABS  Take 1 tablet by mouth daily.    Marland Kitchen letrozole (FEMARA) 2.5 MG tablet Take 1 tablet (2.5 mg total) by mouth daily. 90 tablet 3  . LORazepam (ATIVAN) 0.5 MG tablet TAKE ONE TABLET BY MOUTH EVERY 8 HOURS AS NEEDED (Patient taking differently: TAKE ONE TABLET BY MOUTH EVERY 8 HOURS AS NEEDED FOR ANXIETY) 90 tablet 2  . losartan-hydrochlorothiazide (HYZAAR) 100-12.5 MG tablet Take 1 tablet by mouth daily. 90 tablet 3  . Multiple Vitamin (MULTIVITAMIN) capsule Take 1 capsule by mouth daily.      . Multiple Vitamins-Minerals (EMERGEN-C IMMUNE PO) Take 1 tablet by mouth daily.    . naproxen sodium (ANAPROX) 220 MG tablet Take 220 mg by mouth as needed. pain    . omeprazole (PRILOSEC) 40 MG capsule Take 1 capsule (40 mg total) by mouth daily. 90 capsule 3  . ondansetron (ZOFRAN ODT) 4 MG disintegrating tablet Take 1 tablet (4 mg total) by mouth every 8 (eight) hours as needed for nausea or vomiting. 20 tablet 1  . potassium chloride SA (K-DUR,KLOR-CON) 20 MEQ tablet Take 1 tablet (20 mEq total) by mouth 2 (two) times daily. 180 tablet 3  . rosuvastatin (CRESTOR) 20 MG tablet  Take 1 tablet (20 mg total) by mouth daily. 90 tablet 3     . albuterol  2.5 mg Nebulization Q6H  . doxycycline  100 mg Oral Q12H  . heparin  5,000 Units Subcutaneous Q8H  . losartan  100 mg Oral Daily   And  . hydrochlorothiazide  12.5 mg Oral Daily  . letrozole  2.5 mg Oral Daily  . mometasone-formoterol  2 puff Inhalation BID  . pantoprazole  40 mg Oral Daily  . rosuvastatin  20 mg Oral Daily  . sodium chloride flush  3 mL Intravenous Q12H    Infusions:   No Known Allergies  Social History   Social History  . Marital status: Married    Spouse name: N/A  . Number of children: N/A  . Years of education: N/A   Occupational History  . Not on file.   Social History Main Topics  . Smoking status: Current Every Day Smoker    Packs/day: 0.25    Years: 30.00    Types: Cigarettes  . Smokeless tobacco: Never Used    . Alcohol use No  . Drug use: No  . Sexual activity: Yes    Birth control/ protection: Post-menopausal   Other Topics Concern  . Not on file   Social History Narrative  . No narrative on file    Family History  Problem Relation Age of Onset  . COPD Mother   . Diabetes Father   . Hypertension Father   . COPD Brother   . Cancer Maternal Aunt     ovarian - died of old age  . Cancer Paternal Aunt     had breast ca; died of of a different cancer  . Cancer Brother     REVIEW OF SYSTEMS:  All systems reviewed  Negative to the above problem except as noted above.    PHYSICAL EXAM: Vitals:   12/19/15 0604 12/19/15 1609  BP: (!) 126/53 124/64  Pulse: 100 99  Resp: 20 20  Temp: 98.3 F (36.8 C) (!) 96.9 F (36.1 C)     Intake/Output Summary (Last 24 hours) at 12/19/15 1730 Last data filed at 12/19/15 1500  Gross per 24 hour  Intake              180 ml  Output              415 ml  Net             -235 ml    General:  Well appearing. No respiratory difficulty HEENT: normal Neck: supple. no JVD. Carotids 2+ bilat; no bruits. No lymphadenopathy or thryomegaly appreciated. Chest:  Elastic wrap around chest  Bandage over left breath vac. Cor: PMI nondisplaced. Regular rate & rhythm. No rubs, gallops or murmurs. Lungs: Decreased expiratory flow throughout   Abdomen: soft, nontender, nondistended. Tender RUQ  No definite hepatosplenomegaly. No bruits or masses. Good bowel sounds. Extremities: no cyanosis, clubbing, rash, edema Neuro: alert & oriented x 3, cranial nerves grossly intact. moves all 4 extremities w/o difficulty.   ECG:  ST 112 bpm  Nonspecific ST changes    Results for orders placed or performed during the hospital encounter of 12/18/15 (from the past 24 hour(s))  Basic metabolic panel     Status: Abnormal   Collection Time: 12/18/15  8:30 PM  Result Value Ref Range   Sodium 137 135 - 145 mmol/L   Potassium 4.2 3.5 - 5.1 mmol/L   Chloride 96 (L) 101 - 111  mmol/L   CO2 34 (H) 22 - 32 mmol/L   Glucose, Bld 118 (H) 65 - 99 mg/dL   BUN 12 6 - 20 mg/dL   Creatinine, Ser 0.53 0.44 - 1.00 mg/dL   Calcium 9.3 8.9 - 10.3 mg/dL   GFR calc non Af Amer >60 >60 mL/min   GFR calc Af Amer >60 >60 mL/min   Anion gap 7 5 - 15  Brain natriuretic peptide     Status: Abnormal   Collection Time: 12/18/15  8:30 PM  Result Value Ref Range   B Natriuretic Peptide 205.0 (H) 0.0 - 100.0 pg/mL  Troponin I     Status: Abnormal   Collection Time: 12/18/15  8:30 PM  Result Value Ref Range   Troponin I 0.05 (HH) <0.03 ng/mL  Lactic acid, plasma     Status: None   Collection Time: 12/18/15  8:30 PM  Result Value Ref Range   Lactic Acid, Venous 0.9 0.5 - 1.9 mmol/L  CBC with Differential     Status: Abnormal   Collection Time: 12/18/15  8:30 PM  Result Value Ref Range   WBC 11.3 (H) 4.0 - 10.5 K/uL   RBC 4.22 3.87 - 5.11 MIL/uL   Hemoglobin 13.0 12.0 - 15.0 g/dL   HCT 41.3 36.0 - 46.0 %   MCV 97.9 78.0 - 100.0 fL   MCH 30.8 26.0 - 34.0 pg   MCHC 31.5 30.0 - 36.0 g/dL   RDW 14.9 11.5 - 15.5 %   Platelets 198 150 - 400 K/uL   Neutrophils Relative % 82 %   Neutro Abs 9.2 (H) 1.7 - 7.7 K/uL   Lymphocytes Relative 9 %   Lymphs Abs 1.0 0.7 - 4.0 K/uL   Monocytes Relative 9 %   Monocytes Absolute 1.1 (H) 0.1 - 1.0 K/uL   Eosinophils Relative 0 %   Eosinophils Absolute 0.0 0.0 - 0.7 K/uL   Basophils Relative 0 %   Basophils Absolute 0.0 0.0 - 0.1 K/uL  Lactic acid, plasma     Status: None   Collection Time: 12/18/15 10:44 PM  Result Value Ref Range   Lactic Acid, Venous 0.8 0.5 - 1.9 mmol/L  TSH     Status: Abnormal   Collection Time: 12/19/15  1:13 AM  Result Value Ref Range   TSH 0.131 (L) 0.350 - 4.500 uIU/mL  Troponin I     Status: Abnormal   Collection Time: 12/19/15  1:13 AM  Result Value Ref Range   Troponin I 0.10 (HH) <0.03 ng/mL  Urinalysis, Routine w reflex microscopic     Status: Abnormal   Collection Time: 12/19/15  3:00 AM  Result Value  Ref Range   Color, Urine YELLOW YELLOW   APPearance CLEAR CLEAR   Specific Gravity, Urine <1.005 (L) 1.005 - 1.030   pH 5.5 5.0 - 8.0   Glucose, UA NEGATIVE NEGATIVE mg/dL   Hgb urine dipstick MODERATE (A) NEGATIVE   Bilirubin Urine NEGATIVE NEGATIVE   Ketones, ur 15 (A) NEGATIVE mg/dL   Protein, ur NEGATIVE NEGATIVE mg/dL   Nitrite NEGATIVE NEGATIVE   Leukocytes, UA NEGATIVE NEGATIVE  Urine microscopic-add on     Status: Abnormal   Collection Time: 12/19/15  3:00 AM  Result Value Ref Range   Squamous Epithelial / LPF 0-5 (A) NONE SEEN   WBC, UA NONE SEEN 0 - 5 WBC/hpf   RBC / HPF 0-5 0 - 5 RBC/hpf   Bacteria, UA NONE SEEN NONE SEEN  Troponin I  Status: Abnormal   Collection Time: 12/19/15  6:52 AM  Result Value Ref Range   Troponin I 0.11 (HH) <0.03 ng/mL  Basic metabolic panel     Status: Abnormal   Collection Time: 12/19/15  6:52 AM  Result Value Ref Range   Sodium 140 135 - 145 mmol/L   Potassium 3.9 3.5 - 5.1 mmol/L   Chloride 97 (L) 101 - 111 mmol/L   CO2 33 (H) 22 - 32 mmol/L   Glucose, Bld 110 (H) 65 - 99 mg/dL   BUN 12 6 - 20 mg/dL   Creatinine, Ser 0.44 0.44 - 1.00 mg/dL   Calcium 9.1 8.9 - 10.3 mg/dL   GFR calc non Af Amer >60 >60 mL/min   GFR calc Af Amer >60 >60 mL/min   Anion gap 10 5 - 15  CBC     Status: Abnormal   Collection Time: 12/19/15  6:52 AM  Result Value Ref Range   WBC 11.1 (H) 4.0 - 10.5 K/uL   RBC 4.12 3.87 - 5.11 MIL/uL   Hemoglobin 12.5 12.0 - 15.0 g/dL   HCT 39.9 36.0 - 46.0 %   MCV 96.8 78.0 - 100.0 fL   MCH 30.3 26.0 - 34.0 pg   MCHC 31.3 30.0 - 36.0 g/dL   RDW 14.8 11.5 - 15.5 %   Platelets 203 150 - 400 K/uL  Troponin I     Status: Abnormal   Collection Time: 12/19/15  1:19 PM  Result Value Ref Range   Troponin I 0.07 (HH) <0.03 ng/mL   Ct Angio Chest Pe W Or Wo Contrast  Result Date: 12/19/2015 CLINICAL DATA:  Acute onset of shortness of breath and decreased O2 saturation. Altered mental status. Initial encounter. EXAM:  CT ANGIOGRAPHY CHEST WITH CONTRAST TECHNIQUE: Multidetector CT imaging of the chest was performed using the standard protocol during bolus administration of intravenous contrast. Multiplanar CT image reconstructions and MIPs were obtained to evaluate the vascular anatomy. CONTRAST:  100 mL of Isovue 370 IV contrast COMPARISON:  CT of the chest performed 07/05/2010 FINDINGS: Cardiovascular:  There is no evidence of pulmonary embolus. The heart is unremarkable in appearance. The thoracic aorta is grossly intact, with mild calcification along the aortic arch and proximal great vessels. Mediastinum/Nodes: Scattered coronary artery calcifications are seen. No mediastinal lymphadenopathy is appreciated. No pericardial effusion is identified. The thyroid gland is unremarkable. No axillary lymphadenopathy is seen. Postoperative soft tissue air is noted at the left axilla. Lungs/Pleura: A small right pleural effusion is noted, with associated atelectasis. Peripheral scarring is noted at the anterior right upper lobe. Mild bilateral emphysema is noted. No dominant mass is seen. There is no evidence of pneumothorax. Upper Abdomen: The visualized portions of the liver and spleen are grossly unremarkable. The visualized portions of the pancreas, adrenal glands and kidneys are unremarkable. Musculoskeletal: No acute osseous abnormalities are identified. Vague sclerotic change and heterogeneity at vertebral body T8 raises concern for sequelae of metastatic disease. The visualized musculature is unremarkable in appearance. The patient is status post bilateral mastectomy, with skin staples noted on the left. Vague collections of fluid along the left chest wall are likely postoperative in nature. An associated drain is noted along the left chest wall. Review of the MIP images confirms the above findings. IMPRESSION: 1. No evidence of pulmonary embolus. 2. Small right pleural effusion, with associated atelectasis. Peripheral scarring  at the anterior right upper lobe. Mild bilateral emphysema noted. 3. Scattered coronary artery calcifications seen. 4. Postoperative change  at the left chest wall, reflecting recent mastectomy, with vague collections of postoperative fluid seen. 5. Vague sclerotic change and heterogeneity at vertebral body T8 raises concern for sequelae of metastatic disease. Would correlate with the patient's history. Electronically Signed   By: Garald Balding M.D.   On: 12/19/2015 00:17   Dg Chest Port 1 View  Result Date: 12/18/2015 CLINICAL DATA:  Acute onset of shortness of breath and altered level of consciousness. Decreased O2 saturation. EXAM: PORTABLE CHEST 1 VIEW COMPARISON:  Chest radiograph performed 12/13/2015 FINDINGS: Vascular congestion is noted. Increased interstitial markings may reflect mild interstitial edema. A small right pleural effusion is suspected. No pneumothorax is seen. The cardiomediastinal silhouette is mildly enlarged. Left-sided skin staples are noted. Clips are seen at the right axilla. No acute osseous abnormalities are seen. IMPRESSION: Vascular congestion and mild cardiomegaly. Increased interstitial markings may reflect mild interstitial edema. Small right pleural effusion suspected. Electronically Signed   By: Garald Balding M.D.   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   Presents with SOB and confusion  Sats low on arrival On exam:  Pt tachycardic BP OK  Sats on nasal cannula are in 90s    Pulm exam with markedly decreased exp flow consistent with COPD Volume mildly increased    CXR with vasc congestion  CT shows no pericardial effusion Labs with very mild, flat increase of troponin, probably  represents demand in setting of hypoxia  I would recomm Lasix x 1  Follow response Would try limited echo (prob subcostal view only given bandages) to get gross estimate of LVEF  Has been 4 years  since chemotherapy endedn Medicine service to follow/Rx pulmonary.

## 2015-12-20 DIAGNOSIS — Z833 Family history of diabetes mellitus: Secondary | ICD-10-CM | POA: Diagnosis not present

## 2015-12-20 DIAGNOSIS — G9341 Metabolic encephalopathy: Secondary | ICD-10-CM | POA: Diagnosis present

## 2015-12-20 DIAGNOSIS — J431 Panlobular emphysema: Secondary | ICD-10-CM

## 2015-12-20 DIAGNOSIS — Z7951 Long term (current) use of inhaled steroids: Secondary | ICD-10-CM | POA: Diagnosis not present

## 2015-12-20 DIAGNOSIS — C50112 Malignant neoplasm of central portion of left female breast: Secondary | ICD-10-CM | POA: Diagnosis present

## 2015-12-20 DIAGNOSIS — Z923 Personal history of irradiation: Secondary | ICD-10-CM | POA: Diagnosis not present

## 2015-12-20 DIAGNOSIS — R748 Abnormal levels of other serum enzymes: Secondary | ICD-10-CM | POA: Diagnosis not present

## 2015-12-20 DIAGNOSIS — R4182 Altered mental status, unspecified: Secondary | ICD-10-CM | POA: Diagnosis present

## 2015-12-20 DIAGNOSIS — E785 Hyperlipidemia, unspecified: Secondary | ICD-10-CM | POA: Diagnosis present

## 2015-12-20 DIAGNOSIS — Z79899 Other long term (current) drug therapy: Secondary | ICD-10-CM | POA: Diagnosis not present

## 2015-12-20 DIAGNOSIS — C773 Secondary and unspecified malignant neoplasm of axilla and upper limb lymph nodes: Secondary | ICD-10-CM | POA: Diagnosis present

## 2015-12-20 DIAGNOSIS — I10 Essential (primary) hypertension: Secondary | ICD-10-CM | POA: Diagnosis present

## 2015-12-20 DIAGNOSIS — R0600 Dyspnea, unspecified: Secondary | ICD-10-CM | POA: Diagnosis not present

## 2015-12-20 DIAGNOSIS — I110 Hypertensive heart disease with heart failure: Secondary | ICD-10-CM | POA: Diagnosis present

## 2015-12-20 DIAGNOSIS — Z803 Family history of malignant neoplasm of breast: Secondary | ICD-10-CM | POA: Diagnosis not present

## 2015-12-20 DIAGNOSIS — Z825 Family history of asthma and other chronic lower respiratory diseases: Secondary | ICD-10-CM | POA: Diagnosis not present

## 2015-12-20 DIAGNOSIS — Z8249 Family history of ischemic heart disease and other diseases of the circulatory system: Secondary | ICD-10-CM | POA: Diagnosis not present

## 2015-12-20 DIAGNOSIS — I5033 Acute on chronic diastolic (congestive) heart failure: Secondary | ICD-10-CM | POA: Diagnosis present

## 2015-12-20 DIAGNOSIS — Z8614 Personal history of Methicillin resistant Staphylococcus aureus infection: Secondary | ICD-10-CM | POA: Diagnosis not present

## 2015-12-20 DIAGNOSIS — Z9221 Personal history of antineoplastic chemotherapy: Secondary | ICD-10-CM | POA: Diagnosis not present

## 2015-12-20 DIAGNOSIS — J9601 Acute respiratory failure with hypoxia: Secondary | ICD-10-CM | POA: Diagnosis present

## 2015-12-20 DIAGNOSIS — I248 Other forms of acute ischemic heart disease: Secondary | ICD-10-CM | POA: Diagnosis present

## 2015-12-20 DIAGNOSIS — D72829 Elevated white blood cell count, unspecified: Secondary | ICD-10-CM | POA: Diagnosis present

## 2015-12-20 DIAGNOSIS — J441 Chronic obstructive pulmonary disease with (acute) exacerbation: Secondary | ICD-10-CM | POA: Diagnosis present

## 2015-12-20 DIAGNOSIS — F17210 Nicotine dependence, cigarettes, uncomplicated: Secondary | ICD-10-CM | POA: Diagnosis present

## 2015-12-20 DIAGNOSIS — Z9013 Acquired absence of bilateral breasts and nipples: Secondary | ICD-10-CM | POA: Diagnosis not present

## 2015-12-20 MED ORDER — METHYLPREDNISOLONE SODIUM SUCC 125 MG IJ SOLR
60.0000 mg | Freq: Every day | INTRAMUSCULAR | Status: DC
  Administered 2015-12-20 – 2015-12-21 (×2): 60 mg via INTRAVENOUS
  Filled 2015-12-20 (×2): qty 2

## 2015-12-20 MED ORDER — FUROSEMIDE 10 MG/ML IJ SOLN
20.0000 mg | Freq: Once | INTRAMUSCULAR | Status: AC
  Administered 2015-12-20: 20 mg via INTRAVENOUS
  Filled 2015-12-20: qty 2

## 2015-12-20 NOTE — Progress Notes (Addendum)
PROGRESS NOTE    Sheila Garrison  [id number] DOB: [birth year] DOA: 12/18/2015 PCP: [name], FNP    Brief Narrative:   60 y.o. female with a past medical history significant for breast cancer, COPD, HLD, HTN, neuropathy, and tobacco abuse, presented by EMS with AMS that onset earlier. Patient was discharged earlier today s/p left mastectomy, although Dr. Arnoldo Morale advised her to stay. While in route, she was noted to have an oxygen saturation of 72%, in which she was placed on an NRB mask with improvement.   Pt diagnosed with copd exacerbation   Assessment & Plan:   Principal Problem:   COPD (chronic obstructive pulmonary disease) ([place]) - continue current medication regimen. - doxycycline added for copd exacerbation - addendum: will place on solumedrol 60 mg IV daily  Active Problems: Elevated troponin - cardiology on board and recommending echocardiogram with subcostal images - No chest pain reports. Recent operation - obtain 12 lead EKG    Hypertension -stable continue current regimen.    Hyperlipidemia - stable continue statin    Altered mental status - was from hypoxia. Has resolved  DVT prophylaxis: heparin Code Status: Full Family Communication: None Disposition Plan: Pending improvement in respiratory condition   Consultants:   General surgery    Procedures: None   Antimicrobials: will start doxycycline   Subjective: Pt has no new complaints breathing better today. No acute issues overnight.  Objective: Vitals:   12/20/15 0745 12/20/15 0753 12/20/15 1300 12/20/15 1432  BP:   110/67   Pulse:   (!) 103   Resp:   20   Temp:   98.3 F (36.8 C)   TempSrc:   Oral   SpO2: 98% 100% 93% 91%  Weight:      Height:        Intake/Output Summary (Last 24 hours) at 12/20/15 1435 Last data filed at 12/20/15 1200  Gross per 24 hour  Intake              423 ml  Output                0 ml  Net              423 ml   Filed Weights   12/18/15  1937 12/19/15 0030  Weight: 69.9 kg (154 lb) 70.5 kg (155 lb 6.4 oz)    Examination:  General exam: Appears calm and comfortable  Respiratory system: prolonged exp phase, no wheezes, equal chest rise, no accessory muscle use. Cardiovascular system: S1 & S2 heard, RRR. No JVD, murmurs, rubs, gallops or clicks. Gastrointestinal system: Abdomen is nondistended, soft and nontender. No organomegaly or masses felt. Normal bowel sounds heard. Central nervous system: Alert and oriented. No focal neurological deficits. Extremities: Symmetric 5 x 5 power. Skin: No rashes, lesions or ulcers Psychiatry: Judgement and insight appear normal. Mood & affect appropriate.   Data Reviewed: I have personally reviewed following labs and imaging studies  CBC:  Recent Labs Lab 12/17/15 0731 12/18/15 0714 12/18/15 2030 12/19/15 0652  WBC 11.4* 11.7* 11.3* 11.1*  NEUTROABS  --   --  9.2*  --   HGB 14.0 13.1 13.0 12.5  HCT 45.4 43.3 41.3 39.9  MCV 99.1 99.8 97.9 96.8  PLT 204 192 198 123456   Basic Metabolic Panel:  Recent Labs Lab 12/17/15 0731 12/18/15 0714 12/18/15 2030 12/19/15 0652  NA 137 138 137 140  K 4.2 4.5 4.2 3.9  CL 99* 98* 96* 97*  CO2 31  34* 34* 33*  GLUCOSE 109* 107* 118* 110*  BUN 11 10 12 12   CREATININE 0.50 0.45 0.53 0.44  CALCIUM 8.5* 9.2 9.3 9.1   GFR: Estimated Creatinine Clearance: 70.2 mL/min (by C-G formula based on SCr of 0.44 mg/dL). Liver Function Tests: No results for input(s): AST, ALT, ALKPHOS, BILITOT, PROT, ALBUMIN in the last 168 hours. No results for input(s): LIPASE, AMYLASE in the last 168 hours. No results for input(s): AMMONIA in the last 168 hours. Coagulation Profile: No results for input(s): INR, PROTIME in the last 168 hours. Cardiac Enzymes:  Recent Labs Lab 12/18/15 2030 12/19/15 0113 12/19/15 0652 12/19/15 1319  TROPONINI 0.05* 0.10* 0.11* 0.07*   BNP (last 3 results) No results for input(s): PROBNP in the last 8760  hours. HbA1C: No results for input(s): HGBA1C in the last 72 hours. CBG: No results for input(s): GLUCAP in the last 168 hours. Lipid Profile: No results for input(s): CHOL, HDL, LDLCALC, TRIG, CHOLHDL, LDLDIRECT in the last 72 hours. Thyroid Function Tests:  Recent Labs  12/19/15 0113  TSH 0.131*   Anemia Panel: No results for input(s): VITAMINB12, FOLATE, FERRITIN, TIBC, IRON, RETICCTPCT in the last 72 hours. Sepsis Labs:  Recent Labs Lab 12/18/15 2030 12/18/15 2244  LATICACIDVEN 0.9 0.8    No results found for this or any previous visit (from the past 240 hour(s)).    Radiology Studies: Ct Angio Chest Pe W Or Wo Contrast  Result Date: 12/19/2015 CLINICAL DATA:  Acute onset of shortness of breath and decreased O2 saturation. Altered mental status. Initial encounter. EXAM: CT ANGIOGRAPHY CHEST WITH CONTRAST TECHNIQUE: Multidetector CT imaging of the chest was performed using the standard protocol during bolus administration of intravenous contrast. Multiplanar CT image reconstructions and MIPs were obtained to evaluate the vascular anatomy. CONTRAST:  100 mL of Isovue 370 IV contrast COMPARISON:  CT of the chest performed 07/05/2010 FINDINGS: Cardiovascular:  There is no evidence of pulmonary embolus. The heart is unremarkable in appearance. The thoracic aorta is grossly intact, with mild calcification along the aortic arch and proximal great vessels. Mediastinum/Nodes: Scattered coronary artery calcifications are seen. No mediastinal lymphadenopathy is appreciated. No pericardial effusion is identified. The thyroid gland is unremarkable. No axillary lymphadenopathy is seen. Postoperative soft tissue air is noted at the left axilla. Lungs/Pleura: A small right pleural effusion is noted, with associated atelectasis. Peripheral scarring is noted at the anterior right upper lobe. Mild bilateral emphysema is noted. No dominant mass is seen. There is no evidence of pneumothorax. Upper  Abdomen: The visualized portions of the liver and spleen are grossly unremarkable. The visualized portions of the pancreas, adrenal glands and kidneys are unremarkable. Musculoskeletal: No acute osseous abnormalities are identified. Vague sclerotic change and heterogeneity at vertebral body T8 raises concern for sequelae of metastatic disease. The visualized musculature is unremarkable in appearance. The patient is status post bilateral mastectomy, with skin staples noted on the left. Vague collections of fluid along the left chest wall are likely postoperative in nature. An associated drain is noted along the left chest wall. Review of the MIP images confirms the above findings. IMPRESSION: 1. No evidence of pulmonary embolus. 2. Small right pleural effusion, with associated atelectasis. Peripheral scarring at the anterior right upper lobe. Mild bilateral emphysema noted. 3. Scattered coronary artery calcifications seen. 4. Postoperative change at the left chest wall, reflecting recent mastectomy, with vague collections of postoperative fluid seen. 5. Vague sclerotic change and heterogeneity at vertebral body T8 raises concern for sequelae of  metastatic disease. Would correlate with the patient's history. Electronically Signed   By: Garald Balding M.D.   On: 12/19/2015 00:17   Dg Chest Port 1 View  Result Date: 12/18/2015 CLINICAL DATA:  Acute onset of shortness of breath and altered level of consciousness. Decreased O2 saturation. EXAM: PORTABLE CHEST 1 VIEW COMPARISON:  Chest radiograph performed 12/13/2015 FINDINGS: Vascular congestion is noted. Increased interstitial markings may reflect mild interstitial edema. A small right pleural effusion is suspected. No pneumothorax is seen. The cardiomediastinal silhouette is mildly enlarged. Left-sided skin staples are noted. Clips are seen at the right axilla. No acute osseous abnormalities are seen. IMPRESSION: Vascular congestion and mild cardiomegaly. Increased  interstitial markings may reflect mild interstitial edema. Small right pleural effusion suspected. Electronically Signed   By: Garald Balding M.D.   On: 12/18/2015 20:12    Scheduled Meds: . albuterol  2.5 mg Nebulization Q6H  . doxycycline  100 mg Oral Q12H  . heparin  5,000 Units Subcutaneous Q8H  . losartan  100 mg Oral Daily   And  . hydrochlorothiazide  12.5 mg Oral Daily  . letrozole  2.5 mg Oral Daily  . mometasone-formoterol  2 puff Inhalation BID  . pantoprazole  40 mg Oral Daily  . rosuvastatin  20 mg Oral Daily  . sodium chloride flush  3 mL Intravenous Q12H   Continuous Infusions:   LOS: 0 days    Time spent: > 35   Velvet Bathe, MD [facility] Pager [phone]  If 7PM-7AM, please contact night-coverage [email or web address] Password [email or web address] 12/20/2015, 2:35 PM

## 2015-12-20 NOTE — Progress Notes (Signed)
Patient ID: Sheila Garrison, female   DOB: [birth year], 60 y.o.   MRN: [id number]   Patient Name: Sheila Garrison Date of Encounter: 12/20/2015  Primary Cardiologist: [facility] Problem List     Principal Problem:   COPD (chronic obstructive pulmonary disease) ([place]) Active Problems:   Hypertension   Hyperlipidemia   Altered mental status   Elevated troponin     Subjective   Breathing better no chest pain   Inpatient Medications    Scheduled Meds: . albuterol  2.5 mg Nebulization Q6H  . doxycycline  100 mg Oral Q12H  . heparin  5,000 Units Subcutaneous Q8H  . losartan  100 mg Oral Daily   And  . hydrochlorothiazide  12.5 mg Oral Daily  . letrozole  2.5 mg Oral Daily  . mometasone-formoterol  2 puff Inhalation BID  . pantoprazole  40 mg Oral Daily  . rosuvastatin  20 mg Oral Daily  . sodium chloride flush  3 mL Intravenous Q12H   Continuous Infusions:  PRN Meds: acetaminophen **OR** acetaminophen, albuterol, HYDROmorphone (DILAUDID) injection, ondansetron, ondansetron **OR** ondansetron (ZOFRAN) IV   Vital Signs    Vitals:   12/20/15 0100 12/20/15 0550 12/20/15 0745 12/20/15 0753  BP:  131/64    Pulse:  99    Resp:  20    Temp:  97.6 F (36.4 C)    TempSrc:  Oral    SpO2: 95% 94% 98% 100%  Weight:      Height:        Intake/Output Summary (Last 24 hours) at 12/20/15 0813 Last data filed at 12/19/15 1500  Gross per 24 hour  Intake              180 ml  Output                0 ml  Net              180 ml   Filed Weights   12/18/15 1937 12/19/15 0030  Weight: 69.9 kg (154 lb) 70.5 kg (155 lb 6.4 oz)    Physical Exam    GEN:  Chronically ill white female   HEENT: Grossly normal.  Neck: Supple, no JVD, carotid bruits, or masses. Cardiac: RRR, no murmurs, rubs, or gallops. No clubbing, cyanosis, edema.  Radials/DP/PT 2+ and equal bilaterally.  Respiratory: Diffuse exp wheezing  Left modified radical mastectomy  GI: Soft, nontender, nondistended,  BS + x 4. MS: no deformity or atrophy. Skin: warm and dry, no rash. Neuro:  Strength and sensation are intact. Psych: AAOx3.  Normal affect.  Labs    CBC  Recent Labs  12/18/15 2030 12/19/15 0652  WBC 11.3* 11.1*  NEUTROABS 9.2*  --   HGB 13.0 12.5  HCT 41.3 39.9  MCV 97.9 96.8  PLT 198 123456   Basic Metabolic Panel  Recent Labs  12/18/15 2030 12/19/15 0652  NA 137 140  K 4.2 3.9  CL 96* 97*  CO2 34* 33*  GLUCOSE 118* 110*  BUN 12 12  CREATININE 0.53 0.44  CALCIUM 9.3 9.1   Liver Function Tests No results for input(s): AST, ALT, ALKPHOS, BILITOT, PROT, ALBUMIN in the last 72 hours. No results for input(s): LIPASE, AMYLASE in the last 72 hours. Cardiac Enzymes  Recent Labs  12/19/15 0113 12/19/15 0652 12/19/15 1319  TROPONINI 0.10* 0.11* 0.07*   Thyroid Function Tests  Recent Labs  12/19/15 0113  TSH 0.131*    Telemetry    NSR  no arrhythmia  - Personally Reviewed  ECG    SR nonspecific ST changes  - Personally Reviewed  Radiology    Ct Angio Chest Pe W Or Wo Contrast  Result Date: 12/19/2015 CLINICAL DATA:  Acute onset of shortness of breath and decreased O2 saturation. Altered mental status. Initial encounter. EXAM: CT ANGIOGRAPHY CHEST WITH CONTRAST TECHNIQUE: Multidetector CT imaging of the chest was performed using the standard protocol during bolus administration of intravenous contrast. Multiplanar CT image reconstructions and MIPs were obtained to evaluate the vascular anatomy. CONTRAST:  100 mL of Isovue 370 IV contrast COMPARISON:  CT of the chest performed 07/05/2010 FINDINGS: Cardiovascular:  There is no evidence of pulmonary embolus. The heart is unremarkable in appearance. The thoracic aorta is grossly intact, with mild calcification along the aortic arch and proximal great vessels. Mediastinum/Nodes: Scattered coronary artery calcifications are seen. No mediastinal lymphadenopathy is appreciated. No pericardial effusion is identified.  The thyroid gland is unremarkable. No axillary lymphadenopathy is seen. Postoperative soft tissue air is noted at the left axilla. Lungs/Pleura: A small right pleural effusion is noted, with associated atelectasis. Peripheral scarring is noted at the anterior right upper lobe. Mild bilateral emphysema is noted. No dominant mass is seen. There is no evidence of pneumothorax. Upper Abdomen: The visualized portions of the liver and spleen are grossly unremarkable. The visualized portions of the pancreas, adrenal glands and kidneys are unremarkable. Musculoskeletal: No acute osseous abnormalities are identified. Vague sclerotic change and heterogeneity at vertebral body T8 raises concern for sequelae of metastatic disease. The visualized musculature is unremarkable in appearance. The patient is status post bilateral mastectomy, with skin staples noted on the left. Vague collections of fluid along the left chest wall are likely postoperative in nature. An associated drain is noted along the left chest wall. Review of the MIP images confirms the above findings. IMPRESSION: 1. No evidence of pulmonary embolus. 2. Small right pleural effusion, with associated atelectasis. Peripheral scarring at the anterior right upper lobe. Mild bilateral emphysema noted. 3. Scattered coronary artery calcifications seen. 4. Postoperative change at the left chest wall, reflecting recent mastectomy, with vague collections of postoperative fluid seen. 5. Vague sclerotic change and heterogeneity at vertebral body T8 raises concern for sequelae of metastatic disease. Would correlate with the patient's history. Electronically Signed   By: Garald Balding M.D.   On: 12/19/2015 00:17   Dg Chest Port 1 View  Result Date: 12/18/2015 CLINICAL DATA:  Acute onset of shortness of breath and altered level of consciousness. Decreased O2 saturation. EXAM: PORTABLE CHEST 1 VIEW COMPARISON:  Chest radiograph performed 12/13/2015 FINDINGS: Vascular  congestion is noted. Increased interstitial markings may reflect mild interstitial edema. A small right pleural effusion is suspected. No pneumothorax is seen. The cardiomediastinal silhouette is mildly enlarged. Left-sided skin staples are noted. Clips are seen at the right axilla. No acute osseous abnormalities are seen. IMPRESSION: Vascular congestion and mild cardiomegaly. Increased interstitial markings may reflect mild interstitial edema. Small right pleural effusion suspected. Electronically Signed   By: Garald Balding M.D.   On: 12/18/2015 20:12    Cardiac Studies     Patient Profile      Pt is a 60 yo with hsitory of COPD, HL, HTN, breat CA, (s/p chemo therapy, XRT)  Now s/p modified radical mastectomy on 11/10    She reperesented to hosp with AMS  Sats in EMS 72%  Assessment & Plan    Dyspnea:  No evidence of acute cardiac event Don't think  minimal non evolving Troponin significant and minimal elevation BNP.  Exam and clinical history with Smoking and active wheezing consistent with COPD exacerbation. CTA negative For PE. Continue pulmonary toilet Ok to give dose lasix today to keep dry. Echo 2013  Normal EF Limited echo planned today subcostal images as she is post mastectomy On left with binder in place.   Signed, Jenkins Rouge, MD  12/20/2015, 8:13 AM

## 2015-12-21 ENCOUNTER — Inpatient Hospital Stay (HOSPITAL_COMMUNITY): Payer: PRIVATE HEALTH INSURANCE

## 2015-12-21 DIAGNOSIS — R0600 Dyspnea, unspecified: Secondary | ICD-10-CM

## 2015-12-21 LAB — ECHOCARDIOGRAM LIMITED
HEIGHTINCHES: 64 in
Weight: 2486.4 oz

## 2015-12-21 LAB — T4, FREE: Free T4: 0.92 ng/dL (ref 0.61–1.12)

## 2015-12-21 MED ORDER — TIOTROPIUM BROMIDE MONOHYDRATE 18 MCG IN CAPS: 18.0000 ug | ORAL_CAPSULE | Freq: Every day | RESPIRATORY_TRACT | 0 refills | Status: DC

## 2015-12-21 MED ORDER — PREDNISONE 5 MG PO TABS: ORAL_TABLET | ORAL | 0 refills | Status: DC

## 2015-12-21 MED ORDER — LOSARTAN POTASSIUM 100 MG PO TABS: 100.0000 mg | ORAL_TABLET | Freq: Every day | ORAL | 0 refills | Status: DC

## 2015-12-21 MED ORDER — ALBUTEROL SULFATE (2.5 MG/3ML) 0.083% IN NEBU: 2.5000 mg | INHALATION_SOLUTION | Freq: Four times a day (QID) | RESPIRATORY_TRACT | 0 refills | Status: DC | PRN

## 2015-12-21 MED ORDER — FUROSEMIDE 10 MG/ML IJ SOLN
40.0000 mg | Freq: Once | INTRAMUSCULAR | Status: AC
  Administered 2015-12-21: 40 mg via INTRAVENOUS
  Filled 2015-12-21: qty 4

## 2015-12-21 MED ORDER — FUROSEMIDE 40 MG PO TABS: 40.0000 mg | ORAL_TABLET | Freq: Every day | ORAL | 0 refills | Status: DC

## 2015-12-21 NOTE — Progress Notes (Signed)
Patient ID: DAO MIEDEMA, female   DOB: July 08, 1955, 60 y.o.   MRN: [id number]   Patient Name: Sheila Garrison Date of Encounter: 12/21/2015  Primary Cardiologist: [facility] Problem List     Principal Problem:   COPD with acute exacerbation [facility]) Active Problems:   Hypertension   Hyperlipidemia   Altered mental status   Elevated troponin     Subjective   Breathing better no chest pain Has been OOB to sink Getting nebs now   Inpatient Medications    Scheduled Meds: . albuterol  2.5 mg Nebulization Q6H  . doxycycline  100 mg Oral Q12H  . heparin  5,000 Units Subcutaneous Q8H  . losartan  100 mg Oral Daily   And  . hydrochlorothiazide  12.5 mg Oral Daily  . letrozole  2.5 mg Oral Daily  . methylPREDNISolone (SOLU-MEDROL) injection  60 mg Intravenous Daily  . mometasone-formoterol  2 puff Inhalation BID  . pantoprazole  40 mg Oral Daily  . rosuvastatin  20 mg Oral Daily  . sodium chloride flush  3 mL Intravenous Q12H   Continuous Infusions:  PRN Meds: acetaminophen **OR** acetaminophen, albuterol, HYDROmorphone (DILAUDID) injection, ondansetron, ondansetron **OR** ondansetron (ZOFRAN) IV   Vital Signs    Vitals:   12/21/15 0124 12/21/15 0539 12/21/15 0749 12/21/15 0754  BP:  (!) 97/55    Pulse:  86    Resp:  20    Temp:  98.5 F (36.9 C)    TempSrc:  Oral    SpO2: 97% 97% 91% 95%  Weight:      Height:        Intake/Output Summary (Last 24 hours) at 12/21/15 0755 Last data filed at 12/21/15 0539  Gross per 24 hour  Intake              483 ml  Output             1475 ml  Net             -992 ml   Filed Weights   12/18/15 1937 12/19/15 0030  Weight: 69.9 kg (154 lb) 70.5 kg (155 lb 6.4 oz)    Physical Exam    GEN:  Chronically ill white female   HEENT: Grossly normal.  Neck: Supple, no JVD, carotid bruits, or masses. Cardiac: RRR, no murmurs, rubs, or gallops. No clubbing, cyanosis, edema.  Radials/DP/PT 2+ and equal bilaterally.    Respiratory: Diffuse exp wheezing  Left modified radical mastectomy  GI: Soft, nontender, nondistended, BS + x 4. MS: no deformity or atrophy. Skin: warm and dry, no rash. Neuro:  Strength and sensation are intact. Psych: AAOx3.  Normal affect.  Labs    CBC  Recent Labs  12/18/15 2030 12/19/15 0652  WBC 11.3* 11.1*  NEUTROABS 9.2*  --   HGB 13.0 12.5  HCT 41.3 39.9  MCV 97.9 96.8  PLT 198 123456   Basic Metabolic Panel  Recent Labs  12/18/15 2030 12/19/15 0652  NA 137 140  K 4.2 3.9  CL 96* 97*  CO2 34* 33*  GLUCOSE 118* 110*  BUN 12 12  CREATININE 0.53 0.44  CALCIUM 9.3 9.1   Liver Function Tests No results for input(s): AST, ALT, ALKPHOS, BILITOT, PROT, ALBUMIN in the last 72 hours. No results for input(s): LIPASE, AMYLASE in the last 72 hours. Cardiac Enzymes  Recent Labs  12/19/15 0113 12/19/15 0652 12/19/15 1319  TROPONINI 0.10* 0.11* 0.07*   Thyroid Function Tests  Recent  Labs  12/19/15 0113  TSH 0.131*    Telemetry    NSR no arrhythmia  - Personally Reviewed  ECG    SR nonspecific ST changes  - Personally Reviewed  Radiology    No results found.  Cardiac Studies     Patient Profile      Pt is a 60 yo with hsitory of COPD, HL, HTN, breat CA, (s/p chemo therapy, XRT)  Now s/p modified radical mastectomy on 11/10    She reperesented to hosp with AMS  Sats in EMS 72%  Assessment & Plan    Dyspnea:  No evidence of acute cardiac event Don't think minimal non evolving Troponin significant and minimal elevation BNP.  Exam and clinical history with Smoking and active wheezing consistent with COPD exacerbation. CTA negative For PE. Continue pulmonary toilet  Echo 2013  Normal EF Will talk to tech before office about why limited echo not done yet:-  subcostal images as she is post mastectomy On left with binder in place.   Good diuresis just continue HCTZ for now   Signed, Jenkins Rouge, MD  12/21/2015, 7:55 AM  Patient ID: Sheila Garrison, female   DOB: 08/12/1955, 60 y.o.   MRN: [id number]

## 2015-12-21 NOTE — Discharge Instructions (Signed)
Follow with Primary MD Sheila Dun, FNP in 3 days   Get CBC, CMP, TSH, Free T4, T3 & 2 view Chest X ray checked  by Primary MD in 3 days ( we routinely change or add medications that can affect your baseline labs and fluid status, therefore we recommend that you get the mentioned basic workup next visit with your PCP, your PCP may decide not to get them or add new tests based on their clinical decision)   Activity: As tolerated with Full fall precautions use walker/cane & assistance as needed   Disposition Home    Diet:    Heart Healthy  Check your Weight same time everyday, if you gain over 2 pounds, or you develop in leg swelling, experience more shortness of breath or chest pain, call your Primary MD immediately. Follow Cardiac Low Salt Diet and 1.5 lit/day fluid restriction.   On your next visit with your primary care physician please Get Medicines reviewed and adjusted.   Please request your Prim.MD to go over all Hospital Tests and Procedure/Radiological results at the follow up, please get all Hospital records sent to your Prim MD by signing hospital release before you go home.   If you experience worsening of your admission symptoms, develop shortness of breath, life threatening emergency, suicidal or homicidal thoughts you must seek medical attention immediately by calling 911 or calling your MD immediately  if symptoms less severe.  You Must read complete instructions/literature along with all the possible adverse reactions/side effects for all the Medicines you take and that have been prescribed to you. Take any new Medicines after you have completely understood and accpet all the possible adverse reactions/side effects.   Do not drive, operate heavy machinery, perform activities at heights, swimming or participation in water activities or provide baby sitting services if your were admitted for syncope or siezures until you have seen by Primary MD or a Neurologist and advised to  do so again.  Do not drive when taking Pain medications.    Do not take more than prescribed Pain, Sleep and Anxiety Medications  Special Instructions: If you have smoked or chewed Tobacco  in the last 2 yrs please stop smoking, stop any regular Alcohol  and or any Recreational drug use.  Wear Seat belts while driving.   Please note  You were cared for by a hospitalist during your hospital stay. If you have any questions about your discharge medications or the care you received while you were in the hospital after you are discharged, you can call the unit and asked to speak with the hospitalist on call if the hospitalist that took care of you is not available. Once you are discharged, your primary care physician will handle any further medical issues. Please note that NO REFILLS for any discharge medications will be authorized once you are discharged, as it is imperative that you return to your primary care physician (or establish a relationship with a primary care physician if you do not have one) for your aftercare needs so that they can reassess your need for medications and monitor your lab values.

## 2015-12-21 NOTE — Discharge Summary (Addendum)
Sheila Garrison UUV:[id number] DOB: September 08, 1955 DOA: 12/18/2015  PCP: [name], FNP  Admit date: 12/18/2015  Discharge date: 12/21/2015  Admitted From: Home   Disposition:  Home   Recommendations for Outpatient Follow-up:   Follow up with PCP in 1-2 weeks  PCP Please obtain BMP/CBC, 2 view CXR in 1week,  (see Discharge instructions)   PCP Please follow up on the following pending results: Monitor weight, BMP and diuretic dose closely. Recommend outpatient pulmonary and cardiology follow-up in 1-2 weeks. Check TSH, T3 and free T4 levels in 7-10 days.   Home Health: None  Equipment/Devices: Oxygen  Consultations: None Discharge Condition: Fair  CODE STATUS: Full  Diet Recommendation: Heart healthy   Chief Complaint  Patient presents with  . Altered Mental Status     Brief history of present illness from the day of admission and additional interim summary     Sheila Garrison is a 59 y.o. female with a past medical history significant for breast cancer, COPD, HLD, HTN, neuropathy, and tobacco abuse, presented by EMS with AMS that onset earlier today. Patient was discharged earlier today s/p left mastectomy, although Dr. Arnoldo Morale advised her to stay. While in route, she was noted to have an oxygen saturation of 72%, in which she was placed on an NRB mask with improvement. She has associated post-op pain of the chest wall. She denies any fever or chills. She has been started on a nasal cannula and  IV fluids. Work up included BNP of 205, WBC of 11K, troponin of 0.05.  She did not have any chest pain, orthopnea, coughs, fever or chills.  Hospitalist was asked to admit the patient for acute respiratory failure with hypoxia and altered mental status.  Hospital issues addressed     Acute hypoxic respiratory  failure causing metabolic encephalopathy. This likely was a combination of COPD exacerbation along with mild acute on chronic diastolic CHF with  echo shows a preserved EF of around 60%, he was treated with IV steroids and Lasix, she is much better but qualifies for home oxygen which will be provided. She will be placed on oral Lasix along with steroid taper, request PCP to check her BMP, weight and monitor diuretic dose closely. Repeat chest x-ray 2 view in 7-10 days as well. Will also provide her home nebulizer machine along with albuterol nebulizers and place her onSpiriva. Recommend outpatient pulmonary and Cards follow-up.   Mild non-ACS pattern flat troponin rise. Likely due to demand ischemia from #1 above, this was not ACS, she had no chest pain, EKG was nonacute.   HX ofl breast cancer status post a recent right-sided mastectomy. Follow with primary surgeon.   Dyslipidemia. Continue on statin.   History of smoking. Counseled to quit smoking.   Mildly suppressed TSH. Did be sick euthyroid, free T4 was stable, request PCP to repeat TSH, free T4 and T3 levels in 7-10 days.    Discharge diagnosis     Principal Problem:   COPD with acute exacerbation ([place]) Active Problems:  Hypertension   Hyperlipidemia   Altered mental status   Elevated troponin    Discharge instructions    Discharge Instructions    Diet - low sodium heart healthy    Complete by:  As directed    Discharge instructions    Complete by:  As directed    Follow with Primary MD [name], FNP in 3 days   Get CBC, CMP, TSH, Free T4, T3 & 2 view Chest X ray checked  by Primary MD in 3 days ( we routinely change or add medications that can affect your baseline labs and fluid status, therefore we recommend that you get the mentioned basic workup next visit with your PCP, your PCP may decide not to get them or add new tests based on their clinical decision)   Activity: As tolerated with Full fall  precautions use walker/cane & assistance as needed   Disposition Home    Diet:    Heart Healthy , Check your Weight same time everyday, if you gain over 2 pounds, or you develop in leg swelling, experience more shortness of breath or chest pain, call your Primary MD immediately. Follow Cardiac Low Salt Diet and 1.5 lit/day fluid restriction.   On your next visit with your primary care physician please Get Medicines reviewed and adjusted.   Please request your Prim.MD to go over all Hospital Tests and Procedure/Radiological results at the follow up, please get all Hospital records sent to your Prim MD by signing hospital release before you go home.   If you experience worsening of your admission symptoms, develop shortness of breath, life threatening emergency, suicidal or homicidal thoughts you must seek medical attention immediately by calling 911 or calling your MD immediately  if symptoms less severe.  You Must read complete instructions/literature along with all the possible adverse reactions/side effects for all the Medicines you take and that have been prescribed to you. Take any new Medicines after you have completely understood and accpet all the possible adverse reactions/side effects.   Do not drive, operate heavy machinery, perform activities at heights, swimming or participation in water activities or provide baby sitting services if your were admitted for syncope or siezures until you have seen by Primary MD or a Neurologist and advised to do so again.  Do not drive when taking Pain medications.    Do not take more than prescribed Pain, Sleep and Anxiety Medications  Special Instructions: If you have smoked or chewed Tobacco  in the last 2 yrs please stop smoking, stop any regular Alcohol  and or any Recreational drug use.  Wear Seat belts while driving.   Please note  You were cared for by a hospitalist during your hospital stay. If you have any questions about your  discharge medications or the care you received while you were in the hospital after you are discharged, you can call the unit and asked to speak with the hospitalist on call if the hospitalist that took care of you is not available. Once you are discharged, your primary care physician will handle any further medical issues. Please note that NO REFILLS for any discharge medications will be authorized once you are discharged, as it is imperative that you return to your primary care physician (or establish a relationship with a primary care physician if you do not have one) for your aftercare needs so that they can reassess your need for medications and monitor your lab values.   Increase activity slowly  Complete by:  As directed       Discharge Medications     Medication List    STOP taking these medications   losartan-hydrochlorothiazide 100-12.5 MG tablet Commonly known as:  HYZAAR     TAKE these medications   budesonide-formoterol 160-4.5 MCG/ACT inhaler Commonly known as:  SYMBICORT Inhale 2 puffs into the lungs 2 (two) times daily.   clobetasol cream 0.05 % Commonly known as:  TEMOVATE Apply 1 application topically as needed. rash   EMERGEN-C IMMUNE PO Take 1 tablet by mouth daily.   fexofenadine 180 MG tablet Commonly known as:  ALLEGRA Take 180 mg by mouth daily.   furosemide 40 MG tablet Commonly known as:  LASIX Take 1 tablet (40 mg total) by mouth daily.   Ginkgo Biloba 40 MG Tabs Take 1 tablet by mouth daily.   letrozole 2.5 MG tablet Commonly known as:  FEMARA Take 1 tablet (2.5 mg total) by mouth daily.   LORazepam 0.5 MG tablet Commonly known as:  ATIVAN TAKE ONE TABLET BY MOUTH EVERY 8 HOURS AS NEEDED What changed:  See the new instructions.   losartan 100 MG tablet Commonly known as:  COZAAR Take 1 tablet (100 mg total) by mouth daily.   multivitamin capsule Take 1 capsule by mouth daily.   naproxen sodium 220 MG tablet Commonly known as:   ANAPROX Take 220 mg by mouth as needed. pain   omeprazole 40 MG capsule Commonly known as:  PRILOSEC Take 1 capsule (40 mg total) by mouth daily.   ondansetron 4 MG disintegrating tablet Commonly known as:  ZOFRAN ODT Take 1 tablet (4 mg total) by mouth every 8 (eight) hours as needed for nausea or vomiting.   potassium chloride SA 20 MEQ tablet Commonly known as:  K-DUR,KLOR-CON Take 1 tablet (20 mEq total) by mouth 2 (two) times daily.   predniSONE 5 MG tablet Commonly known as:  DELTASONE Label  & dispense according to the schedule below. 10 Pills PO for 3 days then, 8 Pills PO for 3 days, 6 Pills PO for 3 days, 4 Pills PO for 3 days, 2 Pills PO for 3 days, 1 Pills PO for 3 days, 1/2 Pill  PO for 3 days then STOP. Total 95 pills.   rosuvastatin 20 MG tablet Commonly known as:  CRESTOR Take 1 tablet (20 mg total) by mouth daily.   VITAMIN D-3 PO Take 400 Units by mouth daily. 400 units a day            Durable Medical Equipment        Start     Ordered   12/21/15 1317  For home use only DME oxygen  Once    Question Answer Comment  Mode or (Route) Nasal cannula   Liters per Minute 2   Frequency Continuous (stationary and portable oxygen unit needed)   Oxygen conserving device Yes   Oxygen delivery system Gas      12/21/15 1317      Follow-up Information    Aviva Signs A, MD. Schedule an appointment as soon as possible for a visit on 12/27/2015.   Specialty:  General Surgery Contact information: [address] [place] [address] [phone]        Christy Hawks, FNP. Schedule an appointment as soon as possible for a visit in 3 day(s).   Specialty:  Family Medicine Contact information: [place] [address] [phone]           Major  procedures and Radiology Reports - PLEASE review detailed and final reports thoroughly  -         Chest 2 View  Result Date: 12/14/2015 CLINICAL DATA:  Left breast cancer  metastasized axillary lymph nodes. Dyspnea since chemotherapy 5 years ago. Hypertension. Smoker. EXAM: CHEST  2 VIEW COMPARISON:  07/05/2010 CT chest FINDINGS: The heart is normal in size. There is aortic atherosclerosis. Hyperinflated emphysematous appearing lungs without pneumonic consolidation or CHF. No effusion or pneumothorax. Right mastectomy with axillary surgical clips noted on the right. No definite lytic or blastic disease of the visualized bony thorax. IMPRESSION: Emphysematous hyperinflation of the lungs consistent with COPD. No acute pulmonary disease. Right mastectomy with axillary nodal dissection. Electronically Signed   By: Ashley Royalty M.D.   On: 12/14/2015 00:55   Ct Angio Chest Pe W Or Wo Contrast  Result Date: 12/19/2015 CLINICAL DATA:  Acute onset of shortness of breath and decreased O2 saturation. Altered mental status. Initial encounter. EXAM: CT ANGIOGRAPHY CHEST WITH CONTRAST TECHNIQUE: Multidetector CT imaging of the chest was performed using the standard protocol during bolus administration of intravenous contrast. Multiplanar CT image reconstructions and MIPs were obtained to evaluate the vascular anatomy. CONTRAST:  100 mL of Isovue 370 IV contrast COMPARISON:  CT of the chest performed 07/05/2010 FINDINGS: Cardiovascular:  There is no evidence of pulmonary embolus. The heart is unremarkable in appearance. The thoracic aorta is grossly intact, with mild calcification along the aortic arch and proximal great vessels. Mediastinum/Nodes: Scattered coronary artery calcifications are seen. No mediastinal lymphadenopathy is appreciated. No pericardial effusion is identified. The thyroid gland is unremarkable. No axillary lymphadenopathy is seen. Postoperative soft tissue air is noted at the left axilla. Lungs/Pleura: A small right pleural effusion is noted, with associated atelectasis. Peripheral scarring is noted at the anterior right upper lobe. Mild bilateral emphysema is noted. No  dominant mass is seen. There is no evidence of pneumothorax. Upper Abdomen: The visualized portions of the liver and spleen are grossly unremarkable. The visualized portions of the pancreas, adrenal glands and kidneys are unremarkable. Musculoskeletal: No acute osseous abnormalities are identified. Vague sclerotic change and heterogeneity at vertebral body T8 raises concern for sequelae of metastatic disease. The visualized musculature is unremarkable in appearance. The patient is status post bilateral mastectomy, with skin staples noted on the left. Vague collections of fluid along the left chest wall are likely postoperative in nature. An associated drain is noted along the left chest wall. Review of the MIP images confirms the above findings. IMPRESSION: 1. No evidence of pulmonary embolus. 2. Small right pleural effusion, with associated atelectasis. Peripheral scarring at the anterior right upper lobe. Mild bilateral emphysema noted. 3. Scattered coronary artery calcifications seen. 4. Postoperative change at the left chest wall, reflecting recent mastectomy, with vague collections of postoperative fluid seen. 5. Vague sclerotic change and heterogeneity at vertebral body T8 raises concern for sequelae of metastatic disease. Would correlate with the patient's history. Electronically Signed   By: Garald Balding M.D.   On: 12/19/2015 00:17   Dg Chest Port 1 View  Result Date: 12/18/2015 CLINICAL DATA:  Acute onset of shortness of breath and altered level of consciousness. Decreased O2 saturation. EXAM: PORTABLE CHEST 1 VIEW COMPARISON:  Chest radiograph performed 12/13/2015 FINDINGS: Vascular congestion is noted. Increased interstitial markings may reflect mild interstitial edema. A small right pleural effusion is suspected. No pneumothorax is seen. The cardiomediastinal silhouette is mildly enlarged. Left-sided skin staples are noted. Clips are seen at the  right axilla. No acute osseous abnormalities are  seen. IMPRESSION: Vascular congestion and mild cardiomegaly. Increased interstitial markings may reflect mild interstitial edema. Small right pleural effusion suspected. Electronically Signed   By: Garald Balding M.D.   On: 12/18/2015 20:12   US Breast Ltd Uni Left Inc Axilla  Result Date: 11/22/2015 CLINICAL DATA:: CLINICAL DATA: History of right breast cancer in 2012 status post mastectomy. Patient presents today with a new left breast lump. EXAM: 2D DIGITAL DIAGNOSTIC LEFT MAMMOGRAM WITH CAD AND ADJUNCT TOMO ULTRASOUND LEFT BREAST COMPARISON:  Previous exam(s). ACR Breast Density Category b: There are scattered areas of fibroglandular density. FINDINGS: There is an new asymmetry/distortion within the upper-outer quadrant of the left breast, corresponding to the site of patient's palpable lump, with overlying skin marker in place. An additional smaller focus of distortion is seen within the inner left breast, tomosynthesis CC slice CT. There is also diffuse trabecular thickening throughout the left breast, most prominent within the central and upper aspects of the left breast, and diffuse skin thickening, finding concerning for inflammatory breast cancer. Mammographic images were processed with CAD. On physical exam, there is a palpable mass within the upper-outer quadrant of the left breast. Targeted ultrasound is performed, showing an irregular hypoechoic area at the 1 o'clock axis, 5 cm from the nipple, with prominent shadowing, measuring 2.7 x 1.1 x 1.7 cm, corresponding to the mammographic finding. An additional irregular hypoechoic mass is appreciated within the adjacent outer left breast, 3 o'clock axis, 3 cm from the nipple, measuring 1.2 x 1.2 cm. There is also an irregular hypoechoic mass within the inner left breast at the 9 o'clock axis, 5 cm from the nipple, measuring 1 x 0.7 x 0.6 cm, corresponding to the mammographic finding. Left axillary ultrasound shows at least 3 enlarged and/or amorphous  lymph nodes, suspicious for metastatic lymphadenopathy. IMPRESSION: 1. Irregular hypoechoic area within the OUTER left breast at the 1 o'clock axis, 5 cm from the nipple, with prominent shadowing, measuring 2.7 x 1.1 x 1.7 cm, corresponding to the area of suspicious asymmetry/distortion identified on mammogram and corresponding to the area of patient's palpable lump. This is a highly suspicious finding for which ultrasound-guided biopsy is recommended. 2. Additional irregular hypoechoic mass within the INNER left breast at the 9 o'clock axis, 5 cm from the nipple, measuring 1 x 0.7 x 0.6 cm, corresponding to the smaller area of distortion identified within the inner left breast on mammogram. This is also a highly suspicious finding for which ultrasound-guided biopsy is recommended. 3. Multiple enlarged and amorphous lymph nodes in the left axilla. Ultrasound-guided biopsy of 1 of these lymph nodes is recommended. 4. Diffuse trabecular thickening and skin thickening suggesting a diffuse inflammatory breast cancer. RECOMMENDATION: 1. Ultrasound-guided biopsy of the irregular hypoechoic area, with prominent shadowing, in the OUTER left breast at the 1 o'clock axis. 2. Ultrasound-guided biopsy of the irregular mass within the INNER left breast at the 9 o'clock axis. 3. Ultrasound-guided biopsy of 1 of the enlarged and amorphous lymph nodes in the left axilla. Ordering physician will be contacted with today's results and patient will then be scheduled for ultrasound-guided biopsies at her earliest convenience. I have discussed the findings and recommendations with the patient. Results were also provided in writing at the conclusion of the visit. If applicable, a reminder letter will be sent to the patient regarding the next appointment. BI-RADS CATEGORY  5: Highly suggestive of malignancy. Electronically Signed   By: Roxy Horseman.D.  On: 11/22/2015 14:47   Mm Diag Breast Tomo Uni Left  Result Date:  11/22/2015 CLINICAL DATA:: CLINICAL DATA: History of right breast cancer in 2012 status post mastectomy. Patient presents today with a new left breast lump. EXAM: 2D DIGITAL DIAGNOSTIC LEFT MAMMOGRAM WITH CAD AND ADJUNCT TOMO ULTRASOUND LEFT BREAST COMPARISON:  Previous exam(s). ACR Breast Density Category b: There are scattered areas of fibroglandular density. FINDINGS: There is an new asymmetry/distortion within the upper-outer quadrant of the left breast, corresponding to the site of patient's palpable lump, with overlying skin marker in place. An additional smaller focus of distortion is seen within the inner left breast, tomosynthesis CC slice CT. There is also diffuse trabecular thickening throughout the left breast, most prominent within the central and upper aspects of the left breast, and diffuse skin thickening, finding concerning for inflammatory breast cancer. Mammographic images were processed with CAD. On physical exam, there is a palpable mass within the upper-outer quadrant of the left breast. Targeted ultrasound is performed, showing an irregular hypoechoic area at the 1 o'clock axis, 5 cm from the nipple, with prominent shadowing, measuring 2.7 x 1.1 x 1.7 cm, corresponding to the mammographic finding. An additional irregular hypoechoic mass is appreciated within the adjacent outer left breast, 3 o'clock axis, 3 cm from the nipple, measuring 1.2 x 1.2 cm. There is also an irregular hypoechoic mass within the inner left breast at the 9 o'clock axis, 5 cm from the nipple, measuring 1 x 0.7 x 0.6 cm, corresponding to the mammographic finding. Left axillary ultrasound shows at least 3 enlarged and/or amorphous lymph nodes, suspicious for metastatic lymphadenopathy. IMPRESSION: 1. Irregular hypoechoic area within the OUTER left breast at the 1 o'clock axis, 5 cm from the nipple, with prominent shadowing, measuring 2.7 x 1.1 x 1.7 cm, corresponding to the area of suspicious asymmetry/distortion  identified on mammogram and corresponding to the area of patient's palpable lump. This is a highly suspicious finding for which ultrasound-guided biopsy is recommended. 2. Additional irregular hypoechoic mass within the INNER left breast at the 9 o'clock axis, 5 cm from the nipple, measuring 1 x 0.7 x 0.6 cm, corresponding to the smaller area of distortion identified within the inner left breast on mammogram. This is also a highly suspicious finding for which ultrasound-guided biopsy is recommended. 3. Multiple enlarged and amorphous lymph nodes in the left axilla. Ultrasound-guided biopsy of 1 of these lymph nodes is recommended. 4. Diffuse trabecular thickening and skin thickening suggesting a diffuse inflammatory breast cancer. RECOMMENDATION: 1. Ultrasound-guided biopsy of the irregular hypoechoic area, with prominent shadowing, in the OUTER left breast at the 1 o'clock axis. 2. Ultrasound-guided biopsy of the irregular mass within the INNER left breast at the 9 o'clock axis. 3. Ultrasound-guided biopsy of 1 of the enlarged and amorphous lymph nodes in the left axilla. Ordering physician will be contacted with today's results and patient will then be scheduled for ultrasound-guided biopsies at her earliest convenience. I have discussed the findings and recommendations with the patient. Results were also provided in writing at the conclusion of the visit. If applicable, a reminder letter will be sent to the patient regarding the next appointment. BI-RADS CATEGORY  5: Highly suggestive of malignancy. Electronically Signed   By: Franki Cabot M.D.   On: 11/22/2015 14:47   Mm Clip Placement Left  Result Date: 11/29/2015 CLINICAL DATA:  Post biopsy mammogram of the left breast for clip placement. EXAM: DIAGNOSTIC LEFT MAMMOGRAM POST ULTRASOUND BIOPSY COMPARISON:  Previous exam(s). FINDINGS: Mammographic  images were obtained following ultrasound guided biopsy of 2 areas of shadowing in the left breast and a left  axillary lymph node. The wing shaped biopsy marking clip is appropriately positioned at the site of biopsy at 1 o'clock in the left breast. The ribbon shaped biopsy marking clip is positioned at the site of biopsy in the medial slightly upper left breast. A ribbon shaped biopsy marking clip is seen in the left axilla only on the ML and MLO views. IMPRESSION: 1. Appropriate positioning of the wing shaped biopsy marking clip at 1 o'clock in the left breast. 2. The ribbon shaped biopsy marking clip has been placed in the upper inner quadrant of the left breast at the site of biopsy. This, however, does not appear to correspond with the area of distortion identified mammographically. If breast conservation is being considered, an additional biopsy may be required if there is no evidence of malignancy on pathology at this site. 3. Appropriate positioning of the ribbon shaped biopsy marking clip in the left axilla. Final Assessment: Post Procedure Mammograms for Marker Placement Electronically Signed   By: Ammie Ferrier M.D.   On: 11/29/2015 10:01   [place] Lt Breast Bx W Loc Dev 1st Lesion Img Bx Spec US Guide  Addendum Date: 12/06/2015   ADDENDUM REPORT: 12/06/2015 09:50 ADDENDUM: Pathology of the left breast biopsies revealed 1. 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 2. Lymph node, left axilla - METASTATIC CARCINOMA. 3. Breast, left, 9:00 - INVASIVE MAMMARY CARCINOMA, CARCINOMA IN SITU IS PRESENT. Microscopic Comment: 1 and 3 immunostain for E-cadherin shows very weak staining, favor these are invasive lobular carcinoma. This was found to be concordant by Dr. Theda Sers. Recommend surgical and oncology referrals. At the patient's request, pathology and recommendations were relayed to the patient by phone by Dr. Theda Sers on 12/01/15. She stated she has done well following the biopsies with no bleeding or bruising. Post biopsy instructions were reviewed with the patient. All of her questions were  answered. She was encouraged to call the imaging department of [facility] with any further questions or concerns. The patient has requested that the surgical referral be made to Dr. Arnoldo Morale or Dr. Rosana Hoes in [place]. Referral was made by Jetta Lout, [facility]) to see Dr. Arnoldo Morale on 12/08/15 at 10:45 AM. The patient has been notified of the appointment information. Addendum by Jetta Lout, RRA on 12/06/15. Electronically Signed   By: Franki Cabot M.D.   On: 12/06/2015 09:50   Result Date: 12/06/2015 CLINICAL DATA:  60 year old female presenting for ultrasound-guided biopsy of the left breast and left axilla. EXAM: ULTRASOUND GUIDED LEFT BREAST CORE NEEDLE BIOPSY COMPARISON:  Previous exam(s). FINDINGS: I met with the patient and we discussed the procedure of ultrasound-guided biopsy, including benefits and alternatives. We discussed the high likelihood of a successful procedure. We discussed the risks of the procedure, including infection, bleeding, tissue injury, clip migration, and inadequate sampling. Informed written consent was given. The usual time-out protocol was performed immediately prior to the procedure. Using sterile technique and 1% Lidocaine as local anesthetic, under direct ultrasound visualization, a 14 gauge spring-loaded device was used to perform biopsy of a shadowing mass in the left breast at 1 o'clock using an inferior approach. At the conclusion of the procedure a wing shaped tissue marker clip was deployed into the biopsy cavity. Using sterile technique and 1% Lidocaine as local anesthetic, under direct ultrasound visualization, a 14 gauge spring-loaded device was used to perform biopsy  of a left axillary lymph node using an inferior approach. At the conclusion of the procedure a ribbon shaped tissue marker clip was deployed into the biopsy cavity. Using sterile technique and 1% Lidocaine as local anesthetic, under direct ultrasound visualization, a 14 gauge  spring-loaded device was used to perform biopsy of left breast area of shadowing at 9 o'clock using an inferior approach. At the conclusion of the procedure a ribbon shaped tissue marker clip was deployed into the biopsy cavity. Follow up 2 view mammogram was performed and dictated separately. IMPRESSION: 1. Ultrasound guided biopsy of shadowing mass in the left breast at 1 o'clock. No apparent complications. 2. Ultrasound guided biopsy of left axillary lymph node. No apparent complications. 3. Ultrasound guided biopsy of area of shadowing in left breast at 9 o'clock. No apparent complications. Electronically Signed: By: Ammie Ferrier M.D. On: 11/29/2015 10:01   [place] Lt Breast Bx W Loc Dev Ea Add Lesion Img Bx Spec US Guide  Addendum Date: 12/06/2015   ADDENDUM REPORT: 12/06/2015 09:50 ADDENDUM: Pathology of the left breast biopsies revealed 1. 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 2. Lymph node, left axilla - METASTATIC CARCINOMA. 3. Breast, left, 9:00 - INVASIVE MAMMARY CARCINOMA, CARCINOMA IN SITU IS PRESENT. Microscopic Comment: 1 and 3 immunostain for E-cadherin shows very weak staining, favor these are invasive lobular carcinoma. This was found to be concordant by Dr. Theda Sers. Recommend surgical and oncology referrals. At the patient's request, pathology and recommendations were relayed to the patient by phone by Dr. Theda Sers on 12/01/15. She stated she has done well following the biopsies with no bleeding or bruising. Post biopsy instructions were reviewed with the patient. All of her questions were answered. She was encouraged to call the imaging department of [facility] with any further questions or concerns. The patient has requested that the surgical referral be made to Dr. Arnoldo Morale or Dr. Rosana Hoes in [place]. Referral was made by Jetta Lout, [facility]) to see Dr. Arnoldo Morale on 12/08/15 at 10:45 AM. The patient has been notified of the appointment  information. Addendum by Jetta Lout, RRA on 12/06/15. Electronically Signed   By: Franki Cabot M.D.   On: 12/06/2015 09:50   Result Date: 12/06/2015 CLINICAL DATA:  60 year old female presenting for ultrasound-guided biopsy of the left breast and left axilla. EXAM: ULTRASOUND GUIDED LEFT BREAST CORE NEEDLE BIOPSY COMPARISON:  Previous exam(s). FINDINGS: I met with the patient and we discussed the procedure of ultrasound-guided biopsy, including benefits and alternatives. We discussed the high likelihood of a successful procedure. We discussed the risks of the procedure, including infection, bleeding, tissue injury, clip migration, and inadequate sampling. Informed written consent was given. The usual time-out protocol was performed immediately prior to the procedure. Using sterile technique and 1% Lidocaine as local anesthetic, under direct ultrasound visualization, a 14 gauge spring-loaded device was used to perform biopsy of a shadowing mass in the left breast at 1 o'clock using an inferior approach. At the conclusion of the procedure a wing shaped tissue marker clip was deployed into the biopsy cavity. Using sterile technique and 1% Lidocaine as local anesthetic, under direct ultrasound visualization, a 14 gauge spring-loaded device was used to perform biopsy of a left axillary lymph node using an inferior approach. At the conclusion of the procedure a ribbon shaped tissue marker clip was deployed into the biopsy cavity. Using sterile technique and 1% Lidocaine as local anesthetic, under direct ultrasound visualization, a 14 gauge spring-loaded device was used  to perform biopsy of left breast area of shadowing at 9 o'clock using an inferior approach. At the conclusion of the procedure a ribbon shaped tissue marker clip was deployed into the biopsy cavity. Follow up 2 view mammogram was performed and dictated separately. IMPRESSION: 1. Ultrasound guided biopsy of shadowing mass in the left breast at 1  o'clock. No apparent complications. 2. Ultrasound guided biopsy of left axillary lymph node. No apparent complications. 3. Ultrasound guided biopsy of area of shadowing in left breast at 9 o'clock. No apparent complications. Electronically Signed: By: Ammie Ferrier M.D. On: 11/29/2015 10:01   [place] Lt Breast Bx W Loc Dev Ea Add Lesion Img Bx Spec US Guide  Addendum Date: 12/06/2015   ADDENDUM REPORT: 12/06/2015 09:50 ADDENDUM: Pathology of the left breast biopsies revealed 1. 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 2. Lymph node, left axilla - METASTATIC CARCINOMA. 3. Breast, left, 9:00 - INVASIVE MAMMARY CARCINOMA, CARCINOMA IN SITU IS PRESENT. Microscopic Comment: 1 and 3 immunostain for E-cadherin shows very weak staining, favor these are invasive lobular carcinoma. This was found to be concordant by Dr. Theda Sers. Recommend surgical and oncology referrals. At the patient's request, pathology and recommendations were relayed to the patient by phone by Dr. Theda Sers on 12/01/15. She stated she has done well following the biopsies with no bleeding or bruising. Post biopsy instructions were reviewed with the patient. All of her questions were answered. She was encouraged to call the imaging department of [facility] with any further questions or concerns. The patient has requested that the surgical referral be made to Dr. Arnoldo Morale or Dr. Rosana Hoes in [place]. Referral was made by Jetta Lout, [facility]) to see Dr. Arnoldo Morale on 12/08/15 at 10:45 AM. The patient has been notified of the appointment information. Addendum by Jetta Lout, RRA on 12/06/15. Electronically Signed   By: Franki Cabot M.D.   On: 12/06/2015 09:50   Result Date: 12/06/2015 CLINICAL DATA:  60 year old female presenting for ultrasound-guided biopsy of the left breast and left axilla. EXAM: ULTRASOUND GUIDED LEFT BREAST CORE NEEDLE BIOPSY COMPARISON:  Previous exam(s). FINDINGS: I met with the patient  and we discussed the procedure of ultrasound-guided biopsy, including benefits and alternatives. We discussed the high likelihood of a successful procedure. We discussed the risks of the procedure, including infection, bleeding, tissue injury, clip migration, and inadequate sampling. Informed written consent was given. The usual time-out protocol was performed immediately prior to the procedure. Using sterile technique and 1% Lidocaine as local anesthetic, under direct ultrasound visualization, a 14 gauge spring-loaded device was used to perform biopsy of a shadowing mass in the left breast at 1 o'clock using an inferior approach. At the conclusion of the procedure a wing shaped tissue marker clip was deployed into the biopsy cavity. Using sterile technique and 1% Lidocaine as local anesthetic, under direct ultrasound visualization, a 14 gauge spring-loaded device was used to perform biopsy of a left axillary lymph node using an inferior approach. At the conclusion of the procedure a ribbon shaped tissue marker clip was deployed into the biopsy cavity. Using sterile technique and 1% Lidocaine as local anesthetic, under direct ultrasound visualization, a 14 gauge spring-loaded device was used to perform biopsy of left breast area of shadowing at 9 o'clock using an inferior approach. At the conclusion of the procedure a ribbon shaped tissue marker clip was deployed into the biopsy cavity. Follow up 2 view mammogram was performed and dictated separately. IMPRESSION: 1. Ultrasound guided  biopsy of shadowing mass in the left breast at 1 o'clock. No apparent complications. 2. Ultrasound guided biopsy of left axillary lymph node. No apparent complications. 3. Ultrasound guided biopsy of area of shadowing in left breast at 9 o'clock. No apparent complications. Electronically Signed: By: Ammie Ferrier M.D. On: 11/29/2015 10:01    Micro Results     No results found for this or any previous visit (from the past 240  hour(s)).  Today   Subjective    Christiann Hagerty today has no headache,no chest abdominal pain,no new weakness tingling or numbness, feels much better wants to go home today.     Objective   Blood pressure (!) 110/59, pulse 91, temperature 98.5 F (36.9 C), temperature source Oral, resp. rate 20, height _0  (1.626 m), weight 70.5 kg (155 lb 6.4 oz), last menstrual period 03/08/2010, SpO2 95 %.   Intake/Output Summary (Last 24 hours) at 12/21/15 1317 Last data filed at 12/21/15 0539  Gross per 24 hour  Intake              120 ml  Output             1475 ml  Net            -1355 ml    Exam Awake Alert, Oriented x 3, No new F.N deficits, Normal affect .AT,PERRAL Supple Neck,No JVD, No cervical lymphadenopathy appriciated.  Symmetrical Chest wall movement, Mod air movement bilaterally, Few rales and wheezes RRR,No Gallops,Rubs or new Murmurs, No Parasternal Heave +ve B.Sounds, Abd Soft, Non tender, No organomegaly appriciated, No rebound -guarding or rigidity. No Cyanosis, Clubbing or edema, No new Rash or bruise   Data Review   CBC w Diff: Lab Results  Component Value Date   WBC 11.1 (H) 12/19/2015   HGB 12.5 12/19/2015   HGB 10.1 (L) 11/07/2010   HCT 39.9 12/19/2015   HCT 48.3 (H) 10/07/2015   HCT 31.1 (L) 11/07/2010   PLT 203 12/19/2015   PLT 245 10/07/2015   LYMPHOPCT 9 12/18/2015   LYMPHOPCT 34.7 11/07/2010   MONOPCT 9 12/18/2015   MONOPCT 30.4 (H) 11/07/2010   EOSPCT 0 12/18/2015   EOSPCT 0.2 11/07/2010   BASOPCT 0 12/18/2015   BASOPCT 0.2 11/07/2010    CMP: Lab Results  Component Value Date   NA 140 12/19/2015   NA 143 10/07/2015   K 3.9 12/19/2015   CL 97 (L) 12/19/2015   CO2 33 (H) 12/19/2015   BUN 12 12/19/2015   BUN 12 10/07/2015   CREATININE 0.44 12/19/2015   PROT 7.1 12/13/2015   PROT 7.0 10/07/2015   ALBUMIN 4.1 12/13/2015   ALBUMIN 4.6 10/07/2015   BILITOT 0.3 12/13/2015   BILITOT 0.2 10/07/2015   ALKPHOS 89 12/13/2015   AST 36  12/13/2015   ALT 21 12/13/2015  .   Total Time in preparing paper work, data evaluation and todays exam - 35 minutes  Thurnell Lose M.D on 12/21/2015 at 1:17 PM  [facility]   Office  [phone]

## 2015-12-21 NOTE — Progress Notes (Signed)
*  PRELIMINARY RESULTS* Echocardiogram 2D Echocardiogram has been performed.  Sheila Garrison 12/21/2015, 12:23 PM

## 2015-12-21 NOTE — Care Management Note (Signed)
Case Management Note  Patient Details  Name: Sheila Garrison MRN: [id number] Date of Birth: 05/13/1955  Expected Discharge Date:    12/21/2015              Expected Discharge Plan:  Home/Self Care  In-House Referral:  NA  Discharge planning Services  CM Consult  Post Acute Care Choice:  Durable Medical Equipment Choice offered to:  Patient  DME Arranged:  Oxygen DME Agency:  [facility]   Status of Service:  Completed, signed off  Additional Comments: Pt discharging home today with self care. Pt has met requirements for supplemental oxygen. Pt has chosen [employer] from [employer] of DME providers. Jeneen Rinks, from [employer], is aware of referral, provided pt info and will deliver port o2 tank to pt's room prior to DC. Pt has no further CM needs at this time.   Sherald Barge, RN 12/21/2015, 2:37 PM

## 2015-12-21 NOTE — Progress Notes (Signed)
Left modified radical mastectomy incision intact. Normal ecchymosis present. No hematoma or seroma present. Sanguinous drainage in the JP drain. Upon discharge, patient will be seen in my office on 12/27/2015.

## 2015-12-21 NOTE — Progress Notes (Signed)
SATURATION QUALIFICATIONS: (This note is used to comply with regulatory documentation for home oxygen)  Patient Saturations on Room Air at Rest = 85%  Patient Saturations on Room Air while Ambulating = 82%  Patient Saturations on 3 Liters of oxygen while Ambulating = 89%

## 2015-12-22 ENCOUNTER — Ambulatory Visit (HOSPITAL_COMMUNITY): Payer: No Typology Code available for payment source | Admitting: Oncology

## 2015-12-22 ENCOUNTER — Other Ambulatory Visit (HOSPITAL_COMMUNITY): Payer: No Typology Code available for payment source

## 2015-12-22 LAB — T3: T3, Total: 79 ng/dL (ref 71–180)

## 2015-12-22 NOTE — Care Management (Signed)
Pt had neb ordered at DC. [facility] faxed neb orders and will deliver neb machine to pt's home.

## 2015-12-25 LAB — TYPE AND SCREEN
ABO/RH(D): A POS
ANTIBODY SCREEN: NEGATIVE
UNIT DIVISION: 0
Unit division: 0

## 2015-12-27 ENCOUNTER — Ambulatory Visit (INDEPENDENT_AMBULATORY_CARE_PROVIDER_SITE_OTHER): Payer: PRIVATE HEALTH INSURANCE

## 2015-12-27 ENCOUNTER — Ambulatory Visit (INDEPENDENT_AMBULATORY_CARE_PROVIDER_SITE_OTHER): Payer: PRIVATE HEALTH INSURANCE | Admitting: Family

## 2015-12-27 ENCOUNTER — Encounter: Payer: Self-pay | Admitting: Family

## 2015-12-27 VITALS — BP 140/85 | HR 83 | Temp 97.9°F | Ht 64.0 in | Wt 148.0 lb

## 2015-12-27 DIAGNOSIS — R7989 Other specified abnormal findings of blood chemistry: Secondary | ICD-10-CM

## 2015-12-27 DIAGNOSIS — J441 Chronic obstructive pulmonary disease with (acute) exacerbation: Secondary | ICD-10-CM

## 2015-12-27 DIAGNOSIS — Z720 Tobacco use: Secondary | ICD-10-CM

## 2015-12-27 DIAGNOSIS — I10 Essential (primary) hypertension: Secondary | ICD-10-CM

## 2015-12-27 DIAGNOSIS — C50111 Malignant neoplasm of central portion of right female breast: Secondary | ICD-10-CM | POA: Diagnosis not present

## 2015-12-27 DIAGNOSIS — Z09 Encounter for follow-up examination after completed treatment for conditions other than malignant neoplasm: Secondary | ICD-10-CM | POA: Diagnosis not present

## 2015-12-27 DIAGNOSIS — C50912 Malignant neoplasm of unspecified site of left female breast: Secondary | ICD-10-CM

## 2015-12-27 DIAGNOSIS — R4182 Altered mental status, unspecified: Secondary | ICD-10-CM | POA: Diagnosis not present

## 2015-12-27 DIAGNOSIS — R946 Abnormal results of thyroid function studies: Secondary | ICD-10-CM | POA: Diagnosis not present

## 2015-12-27 DIAGNOSIS — C773 Secondary and unspecified malignant neoplasm of axilla and upper limb lymph nodes: Secondary | ICD-10-CM

## 2015-12-27 NOTE — Progress Notes (Signed)
Subjective:    Patient ID: Sheila Garrison, female    DOB: 1955/02/09, 60 y.o.   MRN: [id number]  PT presents to the office today for hospital follow up. Pt had a left modified radical mastectomy due to left breast carcinoma with metastasis to the axillary lymph nodes on 12/16/15. PT was readmitted on 12/18/15 for  acute respiratory failure with hypoxia and altered mental status. Pt reports feeling better. Pt has intermittent pain of 1 out 10 in her left breast.  Hypertension  This is a chronic problem. The current episode started more than 1 year ago. The problem has been waxing and waning since onset. The problem is uncontrolled. Associated symptoms include malaise/fatigue and shortness of breath. Pertinent negatives include no headaches or peripheral edema. Risk factors for coronary artery disease include dyslipidemia, obesity and sedentary lifestyle. Past treatments include diuretics and angiotensin blockers. The current treatment provides mild improvement. There is no history of CAD/MI, CVA or heart failure.  COPD Pt taking Symbicort BID & Spiriva daily. PT states since being discharged from hospital she has not started smoking again.     Review of Systems  Constitutional: Positive for fatigue and malaise/fatigue.  HENT: Negative.   Respiratory: Positive for shortness of breath.   Cardiovascular: Negative.   Gastrointestinal: Negative.   Musculoskeletal: Negative.   Skin: Positive for wound.  Neurological: Negative for headaches.  Psychiatric/Behavioral: Negative.        Objective:   Physical Exam  Constitutional: She is oriented to person, place, and time. She appears well-developed and well-nourished. No distress.  HENT:  Head: Normocephalic and atraumatic.  Right Ear: External ear normal.  Left Ear: External ear normal.  Mouth/Throat: Oropharynx is clear and moist.  Eyes: Pupils are equal, round, and reactive to light.  Neck: Normal range of motion. Neck supple. No  thyromegaly present.  Cardiovascular: Normal rate, regular rhythm, normal heart sounds and intact distal pulses.   No murmur heard. Pulmonary/Chest: Effort normal and breath sounds normal. No respiratory distress. She has no wheezes.  Abdominal: Soft. Bowel sounds are normal. She exhibits no distension. There is no tenderness.  Musculoskeletal: Normal range of motion. She exhibits no edema or tenderness.  Neurological: She is alert and oriented to person, place, and time.  Skin: Skin is warm and dry. There is pallor.  Psychiatric: She has a normal mood and affect. Her behavior is normal. Judgment and thought content normal.  Vitals reviewed.     BP 140/85   Pulse 83   Temp 97.9 F (36.6 C) (Oral)   Ht 5' 4"  (1.626 m)   Wt 148 lb (67.1 kg)   LMP 03/08/2010   SpO2 93%   BMI 25.40 kg/m      Assessment & Plan:  1. Essential hypertension - BMP8+EGFR - CBC with Differential/Platelet  2. COPD with acute exacerbation ([place]) - BMP8+EGFR - CBC with Differential/Platelet - Ambulatory referral to Pulmonology - DG Chest 2 View; Future  3. Breast cancer metastasized to axillary lymph node, left (HCC) - BMP8+EGFR - CBC with Differential/Platelet  4. Altered mental status, unspecified altered mental status type - BMP8+EGFR - CBC with Differential/Platelet  5. 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 Tobacco abuse - BMP8+EGFR - CBC with Differential/Platelet - DG Chest 2 View; Future  7. Hospital discharge follow-up  - BMP8+EGFR - CBC with Differential/Platelet  8. Abnormal TSH - BMP8+EGFR - Thyroid Panel With TSH  Continue all meds Labs pending Health Maintenance reviewed Diet and exercise encouraged RTO 1 month  [name], FNP

## 2015-12-27 NOTE — Patient Instructions (Signed)
Chronic Obstructive Pulmonary Disease Chronic obstructive pulmonary disease (COPD) is a common lung condition in which airflow from the lungs is limited. COPD is a general term that can be used to describe many different lung problems that limit airflow, including both chronic bronchitis and emphysema. If you have COPD, your lung function will probably never return to normal, but there are measures you can take to improve lung function and make yourself feel better. What are the causes?  Smoking (common).  Exposure to secondhand smoke.  Genetic problems.  Chronic inflammatory lung diseases or recurrent infections. What are the signs or symptoms?  Shortness of breath, especially with physical activity.  Deep, persistent (chronic) cough with a large amount of thick mucus.  Wheezing.  Rapid breaths (tachypnea).  Gray or bluish discoloration (cyanosis) of the skin, especially in your fingers, toes, or lips.  Fatigue.  Weight loss.  Frequent infections or episodes when breathing symptoms become much worse (exacerbations).  Chest tightness. How is this diagnosed? Your health care provider will take a medical history and perform a physical examination to diagnose COPD. Additional tests for COPD may include:  Lung (pulmonary) function tests.  Chest X-ray.  CT scan.  Blood tests. How is this treated? Treatment for COPD may include:  Inhaler and nebulizer medicines. These help manage the symptoms of COPD and make your breathing more comfortable.  Supplemental oxygen. Supplemental oxygen is only helpful if you have a low oxygen level in your blood.  Exercise and physical activity. These are beneficial for nearly all people with COPD.  Lung surgery or transplant.  Nutrition therapy to gain weight, if you are underweight.  Pulmonary rehabilitation. This may involve working with a team of health care providers and specialists, such as respiratory, occupational, and physical  therapists. Follow these instructions at home:  Take all medicines (inhaled or pills) as directed by your health care provider.  Avoid over-the-counter medicines or cough syrups that dry up your airway (such as antihistamines) and slow down the elimination of secretions unless instructed otherwise by your health care provider.  If you are a smoker, the most important thing that you can do is stop smoking. Continuing to smoke will cause further lung damage and breathing trouble. Ask your health care provider for help with quitting smoking. He or she can direct you to community resources or hospitals that provide support.  Avoid exposure to irritants such as smoke, chemicals, and fumes that aggravate your breathing.  Use oxygen therapy and pulmonary rehabilitation if directed by your health care provider. If you require home oxygen therapy, ask your health care provider whether you should purchase a pulse oximeter to measure your oxygen level at home.  Avoid contact with individuals who have a contagious illness.  Avoid extreme temperature and humidity changes.  Eat healthy foods. Eating smaller, more frequent meals and resting before meals may help you maintain your strength.  Stay active, but balance activity with periods of rest. Exercise and physical activity will help you maintain your ability to do things you want to do.  Preventing infection and hospitalization is very important when you have COPD. Make sure to receive all the vaccines your health care provider recommends, especially the pneumococcal and influenza vaccines. Ask your health care provider whether you need a pneumonia vaccine.  Learn and use relaxation techniques to manage stress.  Learn and use controlled breathing techniques as directed by your health care provider. Controlled breathing techniques include: 1. Pursed lip breathing. Start by breathing in (inhaling)   through your nose for 1 second. Then, purse your lips as  if you were going to whistle and breathe out (exhale) through the pursed lips for 2 seconds. 2. Diaphragmatic breathing. Start by putting one hand on your abdomen just above your waist. Inhale slowly through your nose. The hand on your abdomen should move out. Then purse your lips and exhale slowly. You should be able to feel the hand on your abdomen moving in as you exhale.  Learn and use controlled coughing to clear mucus from your lungs. Controlled coughing is a series of short, progressive coughs. The steps of controlled coughing are: 1. Lean your head slightly forward. 2. Breathe in deeply using diaphragmatic breathing. 3. Try to hold your breath for 3 seconds. 4. Keep your mouth slightly open while coughing twice. 5. Spit any mucus out into a tissue. 6. Rest and repeat the steps once or twice as needed. Contact a health care provider if:  You are coughing up more mucus than usual.  There is a change in the color or thickness of your mucus.  Your breathing is more labored than usual.  Your breathing is faster than usual. Get help right away if:  You have shortness of breath while you are resting.  You have shortness of breath that prevents you from:  Being able to talk.  Performing your usual physical activities.  You have chest pain lasting longer than 5 minutes.  Your skin color is more cyanotic than usual.  You measure low oxygen saturations for longer than 5 minutes with a pulse oximeter. This information is not intended to replace advice given to you by your health care provider. Make sure you discuss any questions you have with your health care provider. Document Released: 11/01/2004 Document Revised: 06/30/2015 Document Reviewed: 09/18/2012 Elsevier Interactive Patient Education  2017 [employer]  

## 2015-12-28 LAB — BMP8+EGFR
BUN/Creatinine Ratio: 47 — ABNORMAL HIGH (ref 12–28)
BUN: 22 mg/dL (ref 8–27)
CO2: 28 mmol/L (ref 18–29)
CREATININE: 0.47 mg/dL — AB (ref 0.57–1.00)
Calcium: 9.3 mg/dL (ref 8.7–10.3)
Chloride: 94 mmol/L — ABNORMAL LOW (ref 96–106)
GFR calc Af Amer: 122 mL/min/{1.73_m2} (ref >59)
GFR calc non Af Amer: 106 mL/min/{1.73_m2} (ref >59)
GLUCOSE: 132 mg/dL — AB (ref 65–99)
Potassium: 4.7 mmol/L (ref 3.5–5.2)
SODIUM: 140 mmol/L (ref 134–144)

## 2015-12-28 LAB — CBC WITH DIFFERENTIAL/PLATELET
Basophils Absolute: 0 10*3/uL (ref 0.0–0.2)
Basos: 0 %
EOS (ABSOLUTE): 0.2 10*3/uL (ref 0.0–0.4)
EOS: 1 %
Hematocrit: 41.3 % (ref 34.0–46.6)
Hemoglobin: 13.5 g/dL (ref 11.1–15.9)
LYMPHS ABS: 1.9 10*3/uL (ref 0.7–3.1)
Lymphs: 11 %
MCH: 30 pg (ref 26.6–33.0)
MCHC: 32.7 g/dL (ref 31.5–35.7)
MCV: 92 fL (ref 79–97)
Monocytes Absolute: 0.5 10*3/uL (ref 0.1–0.9)
Monocytes: 3 %
NEUTROS ABS: 13.8 10*3/uL — AB (ref 1.4–7.0)
Neutrophils: 80 %
Platelets: 406 10*3/uL — ABNORMAL HIGH (ref 150–379)
RBC: 4.5 x10E6/uL (ref 3.77–5.28)
RDW: 14.8 % (ref 12.3–15.4)
WBC: 17.2 10*3/uL — AB (ref 3.4–10.8)

## 2015-12-28 LAB — THYROID PANEL WITH TSH
FREE THYROXINE INDEX: 2 (ref 1.2–4.9)
T3 Uptake Ratio: 25 % (ref 24–39)
T4 TOTAL: 8 ug/dL (ref 4.5–12.0)
TSH: 0.466 u[IU]/mL (ref 0.450–4.500)

## 2015-12-28 LAB — IMMATURE CELLS
MYELOCYTES: 1 % — AB (ref 0–0)
Metamyelocytes: 4 % — ABNORMAL HIGH (ref 0–0)

## 2016-01-03 ENCOUNTER — Other Ambulatory Visit: Payer: Self-pay | Admitting: Family

## 2016-01-05 ENCOUNTER — Encounter (HOSPITAL_COMMUNITY): Payer: Self-pay | Admitting: Hematology & Oncology

## 2016-01-05 ENCOUNTER — Encounter (HOSPITAL_COMMUNITY): Payer: PRIVATE HEALTH INSURANCE | Attending: Hematology & Oncology | Admitting: Hematology & Oncology

## 2016-01-05 VITALS — BP 159/87 | HR 96 | Temp 98.0°F | Resp 16 | Wt 151.0 lb

## 2016-01-05 DIAGNOSIS — C50111 Malignant neoplasm of central portion of right female breast: Secondary | ICD-10-CM

## 2016-01-05 DIAGNOSIS — C50912 Malignant neoplasm of unspecified site of left female breast: Secondary | ICD-10-CM | POA: Diagnosis not present

## 2016-01-05 DIAGNOSIS — G620 Drug-induced polyneuropathy: Secondary | ICD-10-CM

## 2016-01-05 DIAGNOSIS — Z170 Estrogen receptor positive status [ER+]: Principal | ICD-10-CM

## 2016-01-05 DIAGNOSIS — C773 Secondary and unspecified malignant neoplasm of axilla and upper limb lymph nodes: Secondary | ICD-10-CM

## 2016-01-05 DIAGNOSIS — Z801 Family history of malignant neoplasm of trachea, bronchus and lung: Secondary | ICD-10-CM

## 2016-01-05 DIAGNOSIS — C50911 Malignant neoplasm of unspecified site of right female breast: Secondary | ICD-10-CM

## 2016-01-05 DIAGNOSIS — Z8041 Family history of malignant neoplasm of ovary: Secondary | ICD-10-CM

## 2016-01-05 DIAGNOSIS — M8589 Other specified disorders of bone density and structure, multiple sites: Secondary | ICD-10-CM

## 2016-01-05 DIAGNOSIS — Z87891 Personal history of nicotine dependence: Secondary | ICD-10-CM

## 2016-01-05 DIAGNOSIS — Z803 Family history of malignant neoplasm of breast: Secondary | ICD-10-CM

## 2016-01-05 DIAGNOSIS — J441 Chronic obstructive pulmonary disease with (acute) exacerbation: Secondary | ICD-10-CM

## 2016-01-05 DIAGNOSIS — C50612 Malignant neoplasm of axillary tail of left female breast: Secondary | ICD-10-CM

## 2016-01-05 NOTE — Progress Notes (Signed)
[facility] NOTE  Patient Care Team: [name], FNP as PCP - General (Nurse Practitioner) Everardo All, MD (Hematology and Oncology) Eppie Gibson, MD (Radiation Oncology) Neldon Mc, MD (General Surgery)  CHIEF COMPLAINTS/PURPOSE OF CONSULTATION:     Stage III (T4N3) R Br cancer    06/21/2010 Initial Diagnosis    Stage III (T4N3) R Br cancer       01/04/2011 - 02/22/2011 Radiation Therapy    50 Gy       05/07/2014 Pathology Results    BCI testing- low risk of late recurrence (3.4% between years 5-10), high likelihood of benefit from extended endocrein therapy, and a 67% relative risk reduction when treated with extended endocrine therapy (27% versus 10.5%).       Breast cancer metastasized to axillary lymph node, left ([place])   11/22/2015 Mammogram    Ultrasound-guided biopsy of the irregular hypoechoic area, with prominent shadowing, in the OUTER left breast at the 1 o'clock axis. 2. Ultrasound-guided biopsy of the irregular mass within the INNER left breast at the 9 o'clock axis. 3. Ultrasound-guided biopsy of 1 of the enlarged and amorphous lymph nodes in the left axilla.      11/22/2015 Imaging    Ultrasound-guided biopsy of the irregular hypoechoic area, with prominent shadowing, in the OUTER left breast at the 1 o'clock axis. 2. Ultrasound-guided biopsy of the irregular mass within the INNER left breast at the 9 o'clock axis. 3. Ultrasound-guided biopsy of 1 of the enlarged and amorphous lymph nodes in the left axilla.      12/16/2015 Surgery    L modified radical mastectomy with Dr. Arnoldo Morale      12/18/2015 - 12/21/2015 Hospital Admission    Admit date: 12/18/2015 Admission diagnosis: altered MS, acute respiratory failure Additional comments: This likely was a combination of COPD exacerbation along with mild acute on chronic diastolic CHF with  echo shows a preserved EF of around 60%, he was treated with IV steroids and  Lasix, she is much better but qualifies for home oxygen which will be provided      12/19/2015 Imaging    No evidence of pulmonary embolus. 2. Small right pleural effusion, with associated atelectasis. Peripheral scarring at the anterior right upper lobe. Mild bilateral emphysema noted. 3. Scattered coronary artery calcifications seen. 4. Postoperative change at the left chest wall, reflecting recent mastectomy, with vague collections of postoperative fluid seen. 5. Vague sclerotic change and heterogeneity at vertebral body T8 raises concern for sequelae of metastatic disease       HISTORY OF PRESENTING ILLNESS:  Sheila Garrison 60 y.o. female known to [facility] with a prior history of Stage III R breast cancer, presents today for new patient evaluation of a stage III L breast carcinoma ER + at 2% HER 2 -. Prior malignancy was ER+ at 53% and HER 2 neu + by [facility].  She was diagnosed with right breast cancer in 2012 and was treated with carboplatin, docetaxel and trastuzumab in 2012/2013. She had right modified radical mastectomy in 2012 for stage III carcinoma.She does not recall doing genetic testing. She has been on AI therapy and notes that she has been compliant. BCI testing suggested benefit for prolonged AI therapy.   Note that mammogram in February ot this year was WNL. Patient noted mass in L breast. On 11/08/15, diagnostic mammogram was suspicious for malignancy.She had a left modified radical mastectomy on 11/10, performed by Dr. Arnoldo Morale. Biopsy shows invasive cancer, metastasis to  left axilla. Stage III disease.   Patient was admitted two weeks ago for altered mental status. She was presented by EMS with AMS on the same day she was discharged from mastectomy. While in route, she was noted to have oxygen saturation of 72%. Work up included BNP of 205, WBC of 11K, troponin of 0.05. Hospitalist was asked to admit the patient for acute respiratory failure with hypoxia and altered mental status.  Dr. Candiss Norse notes this likely was a combination of COPD exacerbation along with mild acute on chronic diastolic CHF. Patient was treated with IV steroids and Lasix. She states she has quit smoking since being discharged.   Patient is unaccompanied today. She has not used oxygen in 3-4 days. Breathing has significantly improved.  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  She is still on endocrine therapy. Needs refill on Femara.  She is overwhelmed with her diagnosis. She does not want to do chemotherapy or radiation again. She wants to discuss recommendations regarding her diagnosis but keep stating "I just don't know." She notes that she needs some time to think about what she may be willing to do. She does not want to miss work. She also notes that her recent hospitalization has scared her quite a bit.   MEDICAL HISTORY:  Past Medical History:  Diagnosis Date  . Anxiety   . Breast CA ([place]) 07/01/2010   rt breast ca  . COPD (chronic obstructive pulmonary disease) ([place])   . Family history of breast cancer    Aunt on father's side  . Hyperlipemia   . Hypertension   . MRSA (methicillin resistant staph aureus) culture positive    in breast  . 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 Nipple discharge    with pain, infection, lump  . S/P radiation therapy 01/08/11 - 02/22/11   Right Chest Wall/ 50 Gy / 25 Fractions, Right [place]/ 50 gy/25 Fractions, Right Axillary Boost/500 cGy/25 Fractions, Right Chest Wall Boost/10 Gy/5 Fractions  . Status post chemotherapy    6 cycles of carboplatin and docetaxel with trastuzumab every 21 days followed by surgery  . Tobacco abuse 06/22/2015  . Use of letrozole (Femara) 03/09/2011  . Wears dentures     SURGICAL HISTORY: Past Surgical History:  Procedure Laterality Date  . BREAST SURGERY  2012  . DILATION AND CURETTAGE OF UTERUS    . MASTECTOMY MODIFIED RADICAL Left 12/16/2015   Procedure: MASTECTOMY MODIFIED RADICAL;  Surgeon: Aviva Signs, MD;  Location: [facility];  Service: General;  Laterality: Left;  . port a catheter insertion    . PORT-A-CATH REMOVAL Left 01/15/2013   Procedure: REMOVAL PORT-A-CATH;  Surgeon: Scherry Ran, MD;  Location: [facility];  Service: General;  Laterality: Left;  . TUBAL LIGATION      SOCIAL HISTORY: Social History   Social History  . Marital status: Married    Spouse name: N/A  . Number of children: N/A  . Years of education: N/A   Occupational History  . Not on file.   Social History Main Topics  . Smoking status: Current Every Day Smoker    Packs/day: 0.25    Years: 30.00    Types: Cigarettes  . Smokeless tobacco: Never Used  . Alcohol use No  . Drug use: No  . Sexual activity: Yes    Birth control/ protection: Post-menopausal   Other Topics Concern  . Not on file   Social History Narrative  . No narrative  on file    FAMILY HISTORY: Family History  Problem Relation Age of Onset  . COPD Mother   . Diabetes Father   . Hypertension Father   . COPD Brother   . Cancer Maternal Aunt     ovarian - died of old age  . Cancer Paternal Aunt     had breast ca; died of of a different cancer  . Cancer Brother   No family history of breast cancer Maternal aunt had ovarian cancer Brother has lung cancer; heavy smoker  ALLERGIES:  has No Known Allergies.  MEDICATIONS:  Current Outpatient Prescriptions  Medication Sig Dispense Refill  . albuterol (PROVENTIL) (2.5 MG/3ML) 0.083% nebulizer solution Take 3 mLs (2.5 mg total) by nebulization every 6 (six) hours as needed for wheezing or shortness of breath. 75 mL 0  . Cholecalciferol (VITAMIN D-3 PO) Take 400 Units by mouth daily. 400 units a day    . clobetasol cream (TEMOVATE) 7.61 % Apply 1 application topically as needed. rash 60 g 2  . fexofenadine (ALLEGRA) 180 MG tablet Take 180 mg by mouth daily.      . furosemide (LASIX) 40 MG tablet Take 1 tablet (40 mg total) by mouth daily. 30 tablet 0  . Ginkgo Biloba 40 MG  TABS Take 1 tablet by mouth daily.    Marland Kitchen letrozole (FEMARA) 2.5 MG tablet Take 1 tablet (2.5 mg total) by mouth daily. 90 tablet 3  . LORazepam (ATIVAN) 0.5 MG tablet TAKE ONE TABLET BY MOUTH EVERY 8 HOURS AS NEEDED (Patient taking differently: TAKE ONE TABLET BY MOUTH EVERY 8 HOURS AS NEEDED FOR ANXIETY) 90 tablet 2  . losartan (COZAAR) 100 MG tablet Take 1 tablet (100 mg total) by mouth daily. 30 tablet 0  . Multiple Vitamin (MULTIVITAMIN) capsule Take 1 capsule by mouth daily.      . Multiple Vitamins-Minerals (EMERGEN-C IMMUNE PO) Take 1 tablet by mouth daily.    . naproxen sodium (ANAPROX) 220 MG tablet Take 220 mg by mouth as needed. pain    . omeprazole (PRILOSEC) 40 MG capsule Take 1 capsule (40 mg total) by mouth daily. 90 capsule 3  . ondansetron (ZOFRAN ODT) 4 MG disintegrating tablet Take 1 tablet (4 mg total) by mouth every 8 (eight) hours as needed for nausea or vomiting. 20 tablet 1  . potassium chloride SA (K-DUR,KLOR-CON) 20 MEQ tablet Take 1 tablet (20 mEq total) by mouth 2 (two) times daily. 180 tablet 3  . predniSONE (DELTASONE) 5 MG tablet Label  & dispense according to the schedule below. 10 Pills PO for 3 days then, 8 Pills PO for 3 days, 6 Pills PO for 3 days, 4 Pills PO for 3 days, 2 Pills PO for 3 days, 1 Pills PO for 3 days, 1/2 Pill  PO for 3 days then STOP. Total 95 pills. 95 tablet 0  . rosuvastatin (CRESTOR) 20 MG tablet Take 1 tablet (20 mg total) by mouth daily. 90 tablet 3  . SYMBICORT 160-4.5 MCG/ACT inhaler INHALE TWO PUFFS BY MOUTH TWICE DAILY 11 g 5  . tiotropium (SPIRIVA HANDIHALER) 18 MCG inhalation capsule Place 1 capsule (18 mcg total) into inhaler and inhale daily. 30 capsule 0   No current facility-administered medications for this visit.     Review of Systems  Constitutional: Negative.   HENT: Negative.   Eyes: Negative.   Respiratory: Negative.   Cardiovascular: Negative.   Gastrointestinal: Negative.   Genitourinary: Negative.   Musculoskeletal:  Negative.   Skin:  Negative.   Neurological: Positive for tingling.       Neuropathy in feet from chemotherapy  Endo/Heme/Allergies: Negative.   Psychiatric/Behavioral: Negative.   All other systems reviewed and are negative. 14 point ROS was done and is otherwise as detailed above or in HPI   PHYSICAL EXAMINATION: ECOG PERFORMANCE STATUS: 1 - Symptomatic but completely ambulatory  Vitals:   01/05/16 1448  BP: (!) 159/87  Pulse: 96  Resp: 16  Temp: 98 F (36.7 C)   Filed Weights   01/05/16 1448  Weight: 151 lb (68.5 kg)     Physical Exam  Constitutional: She is oriented to person, place, and time and well-developed, well-nourished, and in no distress.  HENT:  Head: Normocephalic and atraumatic.  Mouth/Throat: Oropharynx is clear and moist.  Eyes: Conjunctivae and EOM are normal. Pupils are equal, round, and reactive to light. No scleral icterus.  Neck: Normal range of motion. Neck supple.  Cardiovascular: Normal rate, regular rhythm and normal heart sounds.   Pulmonary/Chest: Effort normal and breath sounds normal. No respiratory distress. She has no wheezes. She has no rales.  R mastectomy site well healed L mastectomy site post surgery, mild swelling, echymoses   Abdominal: Soft. Bowel sounds are normal. She exhibits no distension and no mass. There is no tenderness. There is no rebound and no guarding.  Musculoskeletal: Normal range of motion. She exhibits no edema.  Lymphadenopathy:    She has no cervical adenopathy.  Neurological: She is alert and oriented to person, place, and time. Gait normal.  Skin: Skin is warm and dry.  Psychiatric: Mood, memory, affect and judgment normal.  Nursing note and vitals reviewed.   LABORATORY DATA:  I have reviewed the data as listed Lab Results  Component Value Date   WBC 17.2 (H) 12/27/2015   HGB 12.5 12/19/2015   HCT 41.3 12/27/2015   MCV 92 12/27/2015   PLT 406 (H) 12/27/2015   CMP     Component Value Date/Time    NA 140 12/27/2015 1553   K 4.7 12/27/2015 1553   CL 94 (L) 12/27/2015 1553   CO2 28 12/27/2015 1553   GLUCOSE 132 (H) 12/27/2015 1553   GLUCOSE 110 (H) 12/19/2015 0652   BUN 22 12/27/2015 1553   CREATININE 0.47 (L) 12/27/2015 1553   CALCIUM 9.3 12/27/2015 1553   PROT 7.1 12/13/2015 1419   PROT 7.0 10/07/2015 0859   ALBUMIN 4.1 12/13/2015 1419   ALBUMIN 4.6 10/07/2015 0859   AST 36 12/13/2015 1419   ALT 21 12/13/2015 1419   ALKPHOS 89 12/13/2015 1419   BILITOT 0.3 12/13/2015 1419   BILITOT 0.2 10/07/2015 0859   GFRNONAA 106 12/27/2015 1553   GFRAA 122 12/27/2015 1553     RADIOGRAPHIC STUDIES: I have personally reviewed the radiological images as listed and agreed with the findings in the report. No results found. Addendum   ADDENDUM REPORT: 12/06/2015 09:50  ADDENDUM: Pathology of the left breast biopsies revealed 1. 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 2. Lymph node, left axilla - METASTATIC CARCINOMA. 3. Breast, left, 9:00 - INVASIVE MAMMARY CARCINOMA, CARCINOMA IN SITU IS PRESENT. Microscopic Comment: 1 and 3 immunostain for E-cadherin shows very weak staining, favor these are invasive lobular carcinoma.  This was found to be concordant by Dr. Theda Sers. Recommend surgical and oncology referrals.  At the patient's request, pathology and recommendations were relayed to the patient by phone by Dr. Theda Sers on 12/01/15. She stated she has done well following the biopsies  with no bleeding or bruising. Post biopsy instructions were reviewed with the patient. All of her questions were answered. She was encouraged to call the imaging department of [facility] with any further questions or concerns. The patient has requested that the surgical referral be made to Dr. Arnoldo Morale or Dr. Rosana Hoes in [place]. Referral was made by Jetta Lout, [facility]) to see Dr. Arnoldo Morale on 12/08/15 at 10:45 AM. The patient has been notified of  the appointment information.  Addendum by Jetta Lout, RRA on 12/06/15.   Electronically Signed   By: Franki Cabot M.D.   On: 12/06/2015 09:50   Addended by Michiel Cowboy, MD on 12/06/2015 10:04 AM    Study Result   CLINICAL DATA:  60 year old female presenting for ultrasound-guided biopsy of the left breast and left axilla.  EXAM: ULTRASOUND GUIDED LEFT BREAST CORE NEEDLE BIOPSY  COMPARISON:  Previous exam(s).  FINDINGS: I met with the patient and we discussed the procedure of ultrasound-guided biopsy, including benefits and alternatives. We discussed the high likelihood of a successful procedure. We discussed the risks of the procedure, including infection, bleeding, tissue injury, clip migration, and inadequate sampling. Informed written consent was given. The usual time-out protocol was performed immediately prior to the procedure.  Using sterile technique and 1% Lidocaine as local anesthetic, under direct ultrasound visualization, a 14 gauge spring-loaded device was used to perform biopsy of a shadowing mass in the left breast at 1 o'clock using an inferior approach. At the conclusion of the procedure a wing shaped tissue marker clip was deployed into the biopsy cavity.  Using sterile technique and 1% Lidocaine as local anesthetic, under direct ultrasound visualization, a 14 gauge spring-loaded device was used to perform biopsy of a left axillary lymph node using an inferior approach. At the conclusion of the procedure a ribbon shaped tissue marker clip was deployed into the biopsy cavity.  Using sterile technique and 1% Lidocaine as local anesthetic, under direct ultrasound visualization, a 14 gauge spring-loaded device was used to perform biopsy of left breast area of shadowing at 9 o'clock using an inferior approach. At the conclusion of the procedure a ribbon shaped tissue marker clip was deployed into the biopsy cavity. Follow up 2 view  mammogram was performed and dictated separately.  IMPRESSION: 1. Ultrasound guided biopsy of shadowing mass in the left breast at 1 o'clock. No apparent complications.  2. Ultrasound guided biopsy of left axillary lymph node. No apparent complications.  3. Ultrasound guided biopsy of area of shadowing in left breast at 9 o'clock. No apparent complications.  Electronically Signed: By: Ammie Ferrier M.D. On: 11/29/2015 10:01     Study Result   CLINICAL DATA:: CLINICAL DATA: History of right breast cancer in 2012 status post mastectomy. Patient presents today with a new left breast lump.  EXAM: 2D DIGITAL DIAGNOSTIC LEFT MAMMOGRAM WITH CAD AND ADJUNCT TOMO  ULTRASOUND LEFT BREAST  COMPARISON:  Previous exam(s).  ACR Breast Density Category b: There are scattered areas of fibroglandular density.  FINDINGS: There is an new asymmetry/distortion within the upper-outer quadrant of the left breast, corresponding to the site of patient's palpable lump, with overlying skin marker in place.  An additional smaller focus of distortion is seen within the inner left breast, tomosynthesis CC slice CT.  There is also diffuse trabecular thickening throughout the left breast, most prominent within the central and upper aspects of the left breast, and diffuse skin thickening, finding concerning for inflammatory breast cancer.  Mammographic images  were processed with CAD.  On physical exam, there is a palpable mass within the upper-outer quadrant of the left breast.  Targeted ultrasound is performed, showing an irregular hypoechoic area at the 1 o'clock axis, 5 cm from the nipple, with prominent shadowing, measuring 2.7 x 1.1 x 1.7 cm, corresponding to the mammographic finding. An additional irregular hypoechoic mass is appreciated within the adjacent outer left breast, 3 o'clock axis, 3 cm from the nipple, measuring 1.2 x 1.2 cm.  There is also an irregular  hypoechoic mass within the inner left breast at the 9 o'clock axis, 5 cm from the nipple, measuring 1 x 0.7 x 0.6 cm, corresponding to the mammographic finding.  Left axillary ultrasound shows at least 3 enlarged and/or amorphous lymph nodes, suspicious for metastatic lymphadenopathy.  IMPRESSION: 1. Irregular hypoechoic area within the OUTER left breast at the 1 o'clock axis, 5 cm from the nipple, with prominent shadowing, measuring 2.7 x 1.1 x 1.7 cm, corresponding to the area of suspicious asymmetry/distortion identified on mammogram and corresponding to the area of patient's palpable lump. This is a highly suspicious finding for which ultrasound-guided biopsy is recommended. 2. Additional irregular hypoechoic mass within the INNER left breast at the 9 o'clock axis, 5 cm from the nipple, measuring 1 x 0.7 x 0.6 cm, corresponding to the smaller area of distortion identified within the inner left breast on mammogram. This is also a highly suspicious finding for which ultrasound-guided biopsy is recommended. 3. Multiple enlarged and amorphous lymph nodes in the left axilla. Ultrasound-guided biopsy of 1 of these lymph nodes is recommended. 4. Diffuse trabecular thickening and skin thickening suggesting a diffuse inflammatory breast cancer.  RECOMMENDATION: 1. Ultrasound-guided biopsy of the irregular hypoechoic area, with prominent shadowing, in the OUTER left breast at the 1 o'clock axis. 2. Ultrasound-guided biopsy of the irregular mass within the INNER left breast at the 9 o'clock axis. 3. Ultrasound-guided biopsy of 1 of the enlarged and amorphous lymph nodes in the left axilla.  Ordering physician will be contacted with today's results and patient will then be scheduled for ultrasound-guided biopsies at her earliest convenience.  I have discussed the findings and recommendations with the patient. Results were also provided in writing at the conclusion of the visit.  If applicable, a reminder letter will be sent to the patient regarding the next appointment.  BI-RADS CATEGORY  5: Highly suggestive of malignancy.   Electronically Signed   By: Franki Cabot M.D.   On: 11/22/2015 14:47    CLINICAL DATA:  Acute onset of shortness of breath and decreased O2 saturation. Altered mental status. Initial encounter.  EXAM: CT ANGIOGRAPHY CHEST WITH CONTRAST  TECHNIQUE: Multidetector CT imaging of the chest was performed using the standard protocol during bolus administration of intravenous contrast. Multiplanar CT image reconstructions and MIPs were obtained to evaluate the vascular anatomy.  CONTRAST:  100 mL of Isovue 370 IV contrast  COMPARISON:  CT of the chest performed 07/05/2010  FINDINGS: Cardiovascular:  There is no evidence of pulmonary embolus.  The heart is unremarkable in appearance. The thoracic aorta is grossly intact, with mild calcification along the aortic arch and proximal great vessels.  Mediastinum/Nodes: Scattered coronary artery calcifications are seen. No mediastinal lymphadenopathy is appreciated. No pericardial effusion is identified. The thyroid gland is unremarkable. No axillary lymphadenopathy is seen. Postoperative soft tissue air is noted at the left axilla.  Lungs/Pleura: A small right pleural effusion is noted, with associated atelectasis. Peripheral scarring is noted at the anterior  right upper lobe. Mild bilateral emphysema is noted. No dominant mass is seen. There is no evidence of pneumothorax.  Upper Abdomen: The visualized portions of the liver and spleen are grossly unremarkable. The visualized portions of the pancreas, adrenal glands and kidneys are unremarkable.  Musculoskeletal: No acute osseous abnormalities are identified. Vague sclerotic change and heterogeneity at vertebral body T8 raises concern for sequelae of metastatic disease. The visualized musculature is unremarkable in  appearance.  The patient is status post bilateral mastectomy, with skin staples noted on the left. Vague collections of fluid along the left chest wall are likely postoperative in nature. An associated drain is noted along the left chest wall.  Review of the MIP images confirms the above findings.  IMPRESSION: 1. No evidence of pulmonary embolus. 2. Small right pleural effusion, with associated atelectasis. Peripheral scarring at the anterior right upper lobe. Mild bilateral emphysema noted. 3. Scattered coronary artery calcifications seen. 4. Postoperative change at the left chest wall, reflecting recent mastectomy, with vague collections of postoperative fluid seen. 5. Vague sclerotic change and heterogeneity at vertebral body T8 raises concern for sequelae of metastatic disease. Would correlate with the patient's history.   Electronically Signed   By: Garald Balding M.D.   On: 12/19/2015 00:17    PATHOLOGY:      ASSESSMENT & PLAN: Stage III T3N2 L Br Cancer ER+ 2%, Her 2 -  Stage III (T4N3) R Br cancer ER+ 53% HER 2 + by [facility]   - Clinical: Stage IIIC (T4, N3b, cM0)  COPD AI therapy since 2012 BCI testing showing benefit from prolonged therapy   I have recommended chemotherapy and adjuvant XRT. Tumor although ER + is positive at 2%. She is to continue AI therapy for her R breast cancer and I have called in refills. We discussed chemotherapy options at length. I am not sure that she could tolerate additional taxanes as she has residual neuropathy from her first therapy. WE discussed other regimens. WE reviewed the NCCN guidelines and she was provided with the patient guidelines to take home. I also provided her with her pathology report and our breast cancer navigation book.   CT imaging was reviewed (CTA chest) results are noted above. Prior to therapy I would recommend a PET/CT given findings in T8.  I will arrange for patient to meet Anderson Malta, our patient  navigator today.   RTC 2 weeks for additional discussion. She was encouraged to call with any problems or concerns, questions.  This document serves as a record of services personally performed by Ancil Linsey, MD. It was created on her behalf by Elmyra Ricks, a trained medical scribe. The creation of this record is based on the scribe's personal observations and the provider's statements to them. This document has been checked and approved by the attending provider.  I have reviewed the above documentation for accuracy and completeness and I agree with the above.  This note was electronically signed.    Molli Hazard, MD  01/08/2016 9:05 PM

## 2016-01-05 NOTE — Patient Instructions (Addendum)
[place] at [facility] Discharge Instructions  RECOMMENDATIONS MADE BY THE CONSULTANT AND ANY TEST RESULTS WILL BE SENT TO YOUR REFERRING PHYSICIAN.  You saw Dr.Penland today. Follow up in 2 weeks. Call Anderson Malta if you don't hear from her by Tuesday, 01/10/16. See Amy at checkout for appointments.  Thank you for choosing [place] at [facility] to provide your oncology and hematology care.  To afford each patient quality time with our provider, please arrive at least 15 minutes before your scheduled appointment time.   Beginning January 23rd 2017 lab work for the [facility] will be done in the  Main lab at [facility] on 1st floor. If you have a lab appointment with the [place] please come in thru the  Main Entrance and check in at the main information desk  You need to re-schedule your appointment should you arrive 10 or more minutes late.  We strive to give you quality time with our providers, and arriving late affects you and other patients whose appointments are after yours.  Also, if you no show three or more times for appointments you may be dismissed from the clinic at the providers discretion.     Again, thank you for choosing [facility].  Our hope is that these requests will decrease the amount of time that you wait before being seen by our physicians.       _____________________________________________________________  Should you have questions after your visit to [facility], please contact our office at [phone] between the hours of 8:30 a.m. and 4:30 p.m.  Voicemails left after 4:30 p.m. will not be returned until the following business day.  For prescription refill requests, have your pharmacy contact our office.         Resources For Cancer Patients and their Caregivers ? American Cancer Society: Can assist with transportation, wigs, general needs, runs Look Good Feel Better.         906-454-8875 ? Cancer Care: Provides financial assistance, online support groups, medication/co-pay assistance.  1-800-813-HOPE (908)741-3919) ? [facility] Assists [place] cancer patients and their families through emotional , educational and financial support.  351-470-3552 ? [place] DSS Where to apply for food stamps, Medicaid and utility assistance. (304)834-0705 ? [facility]: Transportation to medical appointments. 262 011 1234 ? Social Security Administration: May apply for disability if have a Stage IV cancer. 216-115-1339 316-652-3080 ? [employer], Disability and Transit Services: Assists with nutrition, care and transit needs. [place] Support Programs: @10RELATIVEDAYS @ > Cancer Support Group  2nd Tuesday of the month 1pm-2pm, Journey Room  > Creative Journey  3rd Tuesday of the month 1130am-1pm, Journey Room  > Look Good Feel Better  1st Wednesday of the month 10am-12 noon, Journey Room (Call [place] to register [phone])

## 2016-01-09 ENCOUNTER — Other Ambulatory Visit (HOSPITAL_COMMUNITY): Payer: Self-pay

## 2016-01-09 DIAGNOSIS — C50912 Malignant neoplasm of unspecified site of left female breast: Secondary | ICD-10-CM

## 2016-01-09 DIAGNOSIS — C773 Secondary and unspecified malignant neoplasm of axilla and upper limb lymph nodes: Principal | ICD-10-CM

## 2016-01-09 MED ORDER — LETROZOLE 2.5 MG PO TABS: 2.5000 mg | ORAL_TABLET | Freq: Every day | ORAL | 3 refills | Status: DC

## 2016-01-09 NOTE — Telephone Encounter (Signed)
Received refill request from patients pharmacy for Letrozole. Chart checked and refilled.

## 2016-01-10 ENCOUNTER — Encounter (HOSPITAL_COMMUNITY): Payer: Self-pay | Admitting: Lab

## 2016-01-10 NOTE — Progress Notes (Unsigned)
Referral to [facility].  Port a cath to be put on 12/11

## 2016-01-11 ENCOUNTER — Encounter (HOSPITAL_COMMUNITY): Payer: Self-pay

## 2016-01-11 ENCOUNTER — Encounter (HOSPITAL_COMMUNITY)
Admission: RE | Admit: 2016-01-11 | Discharge: 2016-01-11 | Disposition: A | Discharge status: Home / Self Care | Payer: PRIVATE HEALTH INSURANCE | Source: Ambulatory Visit | Attending: General Surgery | Admitting: General Surgery

## 2016-01-11 NOTE — H&P (Signed)
Sheila Garrison is an 60 y.o. female.   Chief Complaint: Left breast carcinoma, need for central venous access HPI: Patient is a 60 year old white female status post left modified radical mastectomy in November 2017, with her postoperative course complicated by an exacerbation of her COPD, who now presents for a Port-A-Cath insertion on the right side. She is about to undergo chemotherapy to treat her left breast carcinoma.  Past Medical History:  Diagnosis Date  . Anxiety   . Breast CA ([place]) 07/01/2010   rt breast ca  . COPD (chronic obstructive pulmonary disease) ([place])   . Family history of breast cancer    Aunt on father's side  . Hyperlipemia   . Hypertension   . MRSA (methicillin resistant staph aureus) culture positive    in breast  . Neuropathy ([place])    bilateral toes  . Nipple discharge    with pain, infection, lump  . S/P radiation therapy 01/08/11 - 02/22/11   Right Chest Wall/ 50 Gy / 25 Fractions, Right [place] Region/ 50 gy/25 Fractions, Right Axillary Boost/500 cGy/25 Fractions, Right Chest Wall Boost/10 Gy/5 Fractions  . Status post chemotherapy    6 cycles of carboplatin and docetaxel with trastuzumab every 21 days followed by surgery  . Tobacco abuse 06/22/2015  . Use of letrozole ([place]) 03/09/2011  . Wears dentures     Past Surgical History:  Procedure Laterality Date  . BREAST SURGERY  2012  . DILATION AND CURETTAGE OF UTERUS    . MASTECTOMY MODIFIED RADICAL Left 12/16/2015   Procedure: MASTECTOMY MODIFIED RADICAL;  Surgeon: Aviva Signs, MD;  Location: [facility];  Service: General;  Laterality: Left;  . port a catheter insertion    . PORT-A-CATH REMOVAL Left 01/15/2013   Procedure: REMOVAL PORT-A-CATH;  Surgeon: Scherry Ran, MD;  Location: [facility] ORS;  Service: General;  Laterality: Left;  . TUBAL LIGATION      Family History  Problem Relation Age of Onset  . COPD Mother   . Diabetes Father   . Hypertension Father   . COPD Brother   . Cancer Maternal Aunt     ovarian - died of old age  . Cancer Paternal Aunt     had breast ca; died of of a different cancer  . Cancer Brother    Social History:  reports that she has been smoking Cigarettes.  She has a 7.50 pack-year smoking history. She has never used smokeless tobacco. She reports that she does not drink alcohol or use drugs.  Allergies: No Known Allergies  No prescriptions prior to admission.    No results found for this or any previous visit (from the past 48 hour(s)). No results found.  Review of Systems  Constitutional: Negative.   HENT: Negative.   Eyes: Negative.   Respiratory: Negative.   Cardiovascular: Negative.   Gastrointestinal: Negative.   Genitourinary: Negative.   Musculoskeletal: Negative.   Skin: Negative.   Neurological: Negative.   Endo/Heme/Allergies: Negative.   All other systems reviewed and are negative.   Last menstrual period 03/08/2010. Physical Exam  Constitutional: She is oriented to person, place, and time. She appears well-developed and well-nourished.  HENT:  Head: Normocephalic and atraumatic.  Neck: Normal range of motion. Neck supple.  Cardiovascular: Normal rate, regular rhythm and normal heart sounds.   Respiratory: Effort normal and breath sounds normal. She has no wheezes.  GI: Soft. Bowel sounds are normal. There is no tenderness.  Neurological: She is alert and oriented to person, place,  and time.  Skin: Skin is warm and dry.     Assessment/Plan Impression: Left breast carcinoma Plan: Patient scheduled for Port-A-Cath insertion on 01/16/2016. The risks and benefits of the procedure including bleeding, infection, and pneumothorax were fully explained to the patient, who gave informed consent.  Jamesetta So, MD 01/11/2016, 10:06 AM

## 2016-01-12 ENCOUNTER — Other Ambulatory Visit (HOSPITAL_COMMUNITY): Payer: Self-pay | Admitting: Hematology & Oncology

## 2016-01-12 ENCOUNTER — Telehealth (HOSPITAL_COMMUNITY): Payer: Self-pay | Admitting: Emergency Medicine

## 2016-01-12 DIAGNOSIS — C50912 Malignant neoplasm of unspecified site of left female breast: Secondary | ICD-10-CM

## 2016-01-12 DIAGNOSIS — R9389 Abnormal findings on diagnostic imaging of other specified body structures: Secondary | ICD-10-CM

## 2016-01-12 DIAGNOSIS — C50111 Malignant neoplasm of central portion of right female breast: Secondary | ICD-10-CM

## 2016-01-12 DIAGNOSIS — C773 Secondary and unspecified malignant neoplasm of axilla and upper limb lymph nodes: Secondary | ICD-10-CM

## 2016-01-12 NOTE — Telephone Encounter (Signed)
Notified pt that we need to get a PET scan prior to her starting chemo.  This appt is on 01/23/2016 at [facility].  She is to arrive at 12:30 pm.  Nothing to eat or drink 6 hours prior.  I have canceled all of her appts up until then.  I will more than likely start her chemo that Thursday 01/26/2016.  She verbalizes understanding.

## 2016-01-16 ENCOUNTER — Ambulatory Visit (HOSPITAL_COMMUNITY)
Admission: RE | Admit: 2016-01-16 | Discharge: 2016-01-16 | Disposition: A | Discharge status: Home / Self Care | Payer: PRIVATE HEALTH INSURANCE | Source: Ambulatory Visit | Attending: General Surgery | Admitting: General Surgery

## 2016-01-16 ENCOUNTER — Encounter (HOSPITAL_COMMUNITY)
Admission: RE | Disposition: A | Discharge status: Home / Self Care | Payer: Self-pay | Source: Ambulatory Visit | Attending: General Surgery

## 2016-01-16 ENCOUNTER — Ambulatory Visit (HOSPITAL_COMMUNITY): Payer: PRIVATE HEALTH INSURANCE

## 2016-01-16 ENCOUNTER — Ambulatory Visit (HOSPITAL_COMMUNITY): Payer: PRIVATE HEALTH INSURANCE | Admitting: Anesthesiology

## 2016-01-16 ENCOUNTER — Encounter (HOSPITAL_COMMUNITY): Payer: Self-pay | Admitting: *Deleted

## 2016-01-16 DIAGNOSIS — I10 Essential (primary) hypertension: Secondary | ICD-10-CM | POA: Diagnosis not present

## 2016-01-16 DIAGNOSIS — Z923 Personal history of irradiation: Secondary | ICD-10-CM | POA: Insufficient documentation

## 2016-01-16 DIAGNOSIS — C50912 Malignant neoplasm of unspecified site of left female breast: Secondary | ICD-10-CM | POA: Insufficient documentation

## 2016-01-16 DIAGNOSIS — F17210 Nicotine dependence, cigarettes, uncomplicated: Secondary | ICD-10-CM | POA: Insufficient documentation

## 2016-01-16 DIAGNOSIS — F419 Anxiety disorder, unspecified: Secondary | ICD-10-CM | POA: Insufficient documentation

## 2016-01-16 DIAGNOSIS — Z9012 Acquired absence of left breast and nipple: Secondary | ICD-10-CM | POA: Insufficient documentation

## 2016-01-16 DIAGNOSIS — Z8614 Personal history of Methicillin resistant Staphylococcus aureus infection: Secondary | ICD-10-CM | POA: Diagnosis not present

## 2016-01-16 DIAGNOSIS — J449 Chronic obstructive pulmonary disease, unspecified: Secondary | ICD-10-CM | POA: Diagnosis not present

## 2016-01-16 DIAGNOSIS — E785 Hyperlipidemia, unspecified: Secondary | ICD-10-CM | POA: Diagnosis not present

## 2016-01-16 DIAGNOSIS — Z803 Family history of malignant neoplasm of breast: Secondary | ICD-10-CM | POA: Diagnosis not present

## 2016-01-16 DIAGNOSIS — Z95828 Presence of other vascular implants and grafts: Secondary | ICD-10-CM

## 2016-01-16 HISTORY — PX: PORTACATH PLACEMENT: SHX2246

## 2016-01-16 SURGERY — INSERTION, TUNNELED CENTRAL VENOUS DEVICE, WITH PORT
Anesthesia: Monitor Anesthesia Care | Site: Chest | Laterality: Right

## 2016-01-16 MED ORDER — LIDOCAINE HCL (PF) 1 % IJ SOLN
INTRAMUSCULAR | Status: AC
  Filled 2016-01-16: qty 5

## 2016-01-16 MED ORDER — FENTANYL CITRATE (PF) 100 MCG/2ML IJ SOLN
INTRAMUSCULAR | Status: AC
  Filled 2016-01-16: qty 2

## 2016-01-16 MED ORDER — PROPOFOL 500 MG/50ML IV EMUL
INTRAVENOUS | Status: DC | PRN
  Administered 2016-01-16: 100 ug/kg/min via INTRAVENOUS

## 2016-01-16 MED ORDER — MIDAZOLAM HCL 2 MG/2ML IJ SOLN
1.0000 mg | INTRAMUSCULAR | Status: DC | PRN
  Administered 2016-01-16: 2 mg via INTRAVENOUS

## 2016-01-16 MED ORDER — SODIUM CHLORIDE 0.9 % IV SOLN
INTRAVENOUS | Status: DC | PRN
  Administered 2016-01-16: 500 mL via INTRAMUSCULAR

## 2016-01-16 MED ORDER — LIDOCAINE HCL (PF) 1 % IJ SOLN
INTRAMUSCULAR | Status: DC | PRN
  Administered 2016-01-16: 7 mL

## 2016-01-16 MED ORDER — PROPOFOL 10 MG/ML IV BOLUS
INTRAVENOUS | Status: AC
  Filled 2016-01-16: qty 20

## 2016-01-16 MED ORDER — KETOROLAC TROMETHAMINE 30 MG/ML IJ SOLN
INTRAMUSCULAR | Status: AC
  Filled 2016-01-16: qty 1

## 2016-01-16 MED ORDER — CHLORHEXIDINE GLUCONATE CLOTH 2 % EX PADS: 6.0000 | MEDICATED_PAD | Freq: Once | CUTANEOUS | Status: DC

## 2016-01-16 MED ORDER — HEPARIN SOD (PORK) LOCK FLUSH 100 UNIT/ML IV SOLN
INTRAVENOUS | Status: DC | PRN
  Administered 2016-01-16: 500 [IU] via INTRAVENOUS

## 2016-01-16 MED ORDER — FENTANYL CITRATE (PF) 100 MCG/2ML IJ SOLN
25.0000 ug | INTRAMUSCULAR | Status: AC | PRN
  Administered 2016-01-16 (×2): 25 ug via INTRAVENOUS

## 2016-01-16 MED ORDER — HEPARIN SOD (PORK) LOCK FLUSH 100 UNIT/ML IV SOLN
INTRAVENOUS | Status: AC
  Filled 2016-01-16: qty 5

## 2016-01-16 MED ORDER — LIDOCAINE HCL (PF) 1 % IJ SOLN
INTRAMUSCULAR | Status: AC
  Filled 2016-01-16: qty 30

## 2016-01-16 MED ORDER — ONDANSETRON HCL 4 MG/2ML IJ SOLN
INTRAMUSCULAR | Status: DC | PRN
  Administered 2016-01-16: 4 mg via INTRAVENOUS

## 2016-01-16 MED ORDER — MIDAZOLAM HCL 2 MG/2ML IJ SOLN
INTRAMUSCULAR | Status: AC
  Filled 2016-01-16: qty 2

## 2016-01-16 MED ORDER — CEFAZOLIN SODIUM-DEXTROSE 2-4 GM/100ML-% IV SOLN
INTRAVENOUS | Status: AC
  Filled 2016-01-16: qty 100

## 2016-01-16 MED ORDER — CEFAZOLIN SODIUM-DEXTROSE 2-4 GM/100ML-% IV SOLN
2.0000 g | INTRAVENOUS | Status: AC
  Administered 2016-01-16: 2 g via INTRAVENOUS

## 2016-01-16 MED ORDER — KETOROLAC TROMETHAMINE 30 MG/ML IJ SOLN
30.0000 mg | Freq: Once | INTRAMUSCULAR | Status: AC
  Administered 2016-01-16: 30 mg via INTRAVENOUS

## 2016-01-16 MED ORDER — LACTATED RINGERS IV SOLN
INTRAVENOUS | Status: DC | PRN
  Administered 2016-01-16: 08:00:00 via INTRAVENOUS

## 2016-01-16 MED ORDER — LACTATED RINGERS IV SOLN
INTRAVENOUS | Status: DC
Start: 2016-01-16 — End: 2016-01-16
  Administered 2016-01-16: 1000 mL via INTRAVENOUS

## 2016-01-16 MED ORDER — ONDANSETRON HCL 4 MG/2ML IJ SOLN
INTRAMUSCULAR | Status: AC
  Filled 2016-01-16: qty 2

## 2016-01-16 SURGICAL SUPPLY — 31 items
BAG DECANTER FOR FLEXI CONT (MISCELLANEOUS) ×3 IMPLANT
BAG HAMPER (MISCELLANEOUS) ×3 IMPLANT
CHLORAPREP W/TINT 10.5 ML (MISCELLANEOUS) ×3 IMPLANT
CLOTH BEACON ORANGE TIMEOUT ST (SAFETY) ×3 IMPLANT
COVER LIGHT HANDLE STERIS (MISCELLANEOUS) ×6 IMPLANT
DECANTER SPIKE VIAL GLASS SM (MISCELLANEOUS) ×3 IMPLANT
DERMABOND ADVANCED (GAUZE/BANDAGES/DRESSINGS) ×2
DERMABOND ADVANCED .7 DNX12 (GAUZE/BANDAGES/DRESSINGS) ×1 IMPLANT
DRAPE C-ARM FOLDED MOBILE STRL (DRAPES) ×3 IMPLANT
ELECT REM PT RETURN 9FT ADLT (ELECTROSURGICAL) ×3
ELECTRODE REM PT RTRN 9FT ADLT (ELECTROSURGICAL) ×1 IMPLANT
GLOVE BIOGEL PI IND STRL 7.0 (GLOVE) ×1 IMPLANT
GLOVE BIOGEL PI INDICATOR 7.0 (GLOVE) ×2
GLOVE ECLIPSE 6.5 STRL STRAW (GLOVE) ×3 IMPLANT
GLOVE EXAM NITRILE MD LF STRL (GLOVE) ×3 IMPLANT
GLOVE SURG SS PI 7.5 STRL IVOR (GLOVE) ×3 IMPLANT
GOWN STRL REUS W/TWL LRG LVL3 (GOWN DISPOSABLE) ×6 IMPLANT
IV NS 500ML (IV SOLUTION) ×2
IV NS 500ML BAXH (IV SOLUTION) ×1 IMPLANT
KIT PORT POWER 8FR ISP MRI (Port) ×3 IMPLANT
KIT ROOM TURNOVER APOR (KITS) ×3 IMPLANT
MANIFOLD NEPTUNE II (INSTRUMENTS) ×3 IMPLANT
NEEDLE HYPO 25X1 1.5 SAFETY (NEEDLE) ×3 IMPLANT
PACK MINOR (CUSTOM PROCEDURE TRAY) ×3 IMPLANT
PAD ARMBOARD 7.5X6 YLW CONV (MISCELLANEOUS) ×3 IMPLANT
SET BASIN LINEN APH (SET/KITS/TRAYS/PACK) ×3 IMPLANT
SUT VIC AB 3-0 SH 27 (SUTURE) ×2
SUT VIC AB 3-0 SH 27X BRD (SUTURE) ×1 IMPLANT
SUT VIC AB 4-0 PS2 27 (SUTURE) ×3 IMPLANT
SYR 20CC LL (SYRINGE) ×3 IMPLANT
SYR CONTROL 10ML LL (SYRINGE) ×3 IMPLANT

## 2016-01-16 NOTE — Interval H&P Note (Signed)
History and Physical Interval Note:  01/16/2016 8:02 AM  Sheila Garrison  has presented today for surgery, with the diagnosis of left breast cancer  The various methods of treatment have been discussed with the patient and family. After consideration of risks, benefits and other options for treatment, the patient has consented to  Procedure(s): INSERTION PORT-A-CATH (Right) as a surgical intervention .  The patient's history has been reviewed, patient examined, no change in status, stable for surgery.  I have reviewed the patient's chart and labs.  Questions were answered to the patient's satisfaction.     Aviva Signs A

## 2016-01-16 NOTE — Anesthesia Postprocedure Evaluation (Signed)
Anesthesia Post Note  Patient: Sheila Garrison  Procedure(s) Performed: Procedure(s) (LRB): INSERTION PORT-A-CATH (Right)  Patient location during evaluation: PACU Anesthesia Type: MAC Level of consciousness: awake and alert Pain management: pain level controlled Vital Signs Assessment: post-procedure vital signs reviewed and stable Respiratory status: spontaneous breathing, nonlabored ventilation, respiratory function stable and patient connected to nasal cannula oxygen Cardiovascular status: stable and blood pressure returned to baseline Anesthetic complications: no    Last Vitals:  Vitals:   01/16/16 0805 01/16/16 0810  BP: 127/70 108/78  Resp: 18 18  Temp:      Last Pain:  Vitals:   01/16/16 0737  TempSrc: Oral                 Arsema Tusing

## 2016-01-16 NOTE — Discharge Instructions (Signed)
Implanted Port Insertion, Care After °Refer to this sheet in the next few weeks. These instructions provide you with information on caring for yourself after your procedure. Your health care provider may also give you more specific instructions. Your treatment has been planned according to current medical practices, but problems sometimes occur. Call your health care provider if you have any problems or questions after your procedure. °WHAT TO EXPECT AFTER THE PROCEDURE °After your procedure, it is typical to have the following:  °· Discomfort at the port insertion site. Ice packs to the area will help. °· Bruising on the skin over the port. This will subside in 3-4 days. °HOME CARE INSTRUCTIONS °· After your port is placed, you will get a manufacturer's information card. The card has information about your port. Keep this card with you at all times.   °· Know what kind of port you have. There are many types of ports available.   °· Wear a medical alert bracelet in case of an emergency. This can help alert health care workers that you have a port.   °· The port can stay in for as long as your health care provider believes it is necessary.   °· A home health care nurse may give medicines and take care of the port.   °· You or a family member can get special training and directions for giving medicine and taking care of the port at home.   °SEEK MEDICAL CARE IF:  °· Your port does not flush or you are unable to get a blood return.   °· You have a fever or chills. °SEEK IMMEDIATE MEDICAL CARE IF: °· You have new fluid or pus coming from your incision.   °· You notice a bad smell coming from your incision site.   °· You have swelling, pain, or more redness at the incision or port site.   °· You have chest pain or shortness of breath. °This information is not intended to replace advice given to you by your health care provider. Make sure you discuss any questions you have with your health care provider. °Document  Released: 11/12/2012 Document Revised: 01/27/2013 Document Reviewed: 11/12/2012 °Elsevier Interactive Patient Education © 2017 Elsevier Inc. °Implanted Port Home Guide °An implanted port is a type of central line that is placed under the skin. Central lines are used to provide IV access when treatment or nutrition needs to be given through a person's veins. Implanted ports are used for long-term IV access. An implanted port may be placed because:  °· You need IV medicine that would be irritating to the small veins in your hands or arms.   °· You need long-term IV medicines, such as antibiotics.   °· You need IV nutrition for a long period.   °· You need frequent blood draws for lab tests.   °· You need dialysis.   °Implanted ports are usually placed in the chest area, but they can also be placed in the upper arm, the abdomen, or the leg. An implanted port has two main parts:  °· Reservoir. The reservoir is round and will appear as a small, raised area under your skin. The reservoir is the part where a needle is inserted to give medicines or draw blood.   °· Catheter. The catheter is a thin, flexible tube that extends from the reservoir. The catheter is placed into a large vein. Medicine that is inserted into the reservoir goes into the catheter and then into the vein.   °HOW WILL I CARE FOR MY INCISION SITE? °Do not get the incision site wet.   Bathe or shower as directed by your health care provider.  °HOW IS MY PORT ACCESSED? °Special steps must be taken to access the port:  °· Before the port is accessed, a numbing cream can be placed on the skin. This helps numb the skin over the port site.   °· Your health care provider uses a sterile technique to access the port. °¨ Your health care provider must put on a mask and sterile gloves. °¨ The skin over your port is cleaned carefully with an antiseptic and allowed to dry. °¨ The port is gently pinched between sterile gloves, and a needle is inserted into the  port. °· Only "non-coring" port needles should be used to access the port. Once the port is accessed, a blood return should be checked. This helps ensure that the port is in the vein and is not clogged.   °· If your port needs to remain accessed for a constant infusion, a clear (transparent) bandage will be placed over the needle site. The bandage and needle will need to be changed every week, or as directed by your health care provider.   °· Keep the bandage covering the needle clean and dry. Do not get it wet. Follow your health care provider's instructions on how to take a shower or bath while the port is accessed.   °· If your port does not need to stay accessed, no bandage is needed over the port.   °WHAT IS FLUSHING? °Flushing helps keep the port from getting clogged. Follow your health care provider's instructions on how and when to flush the port. Ports are usually flushed with saline solution or a medicine called heparin. The need for flushing will depend on how the port is used.  °· If the port is used for intermittent medicines or blood draws, the port will need to be flushed:   °¨ After medicines have been given.   °¨ After blood has been drawn.   °¨ As part of routine maintenance.   °· If a constant infusion is running, the port may not need to be flushed.   °HOW LONG WILL MY PORT STAY IMPLANTED? °The port can stay in for as long as your health care provider thinks it is needed. When it is time for the port to come out, surgery will be done to remove it. The procedure is similar to the one performed when the port was put in.  °WHEN SHOULD I SEEK IMMEDIATE MEDICAL CARE? °When you have an implanted port, you should seek immediate medical care if:  °· You notice a bad smell coming from the incision site.   °· You have swelling, redness, or drainage at the incision site.   °· You have more swelling or pain at the port site or the surrounding area.   °· You have a fever that is not controlled with  medicine. °This information is not intended to replace advice given to you by your health care provider. Make sure you discuss any questions you have with your health care provider. °Document Released: 01/22/2005 Document Revised: 11/12/2012 Document Reviewed: 09/29/2012 °Elsevier Interactive Patient Education © 2017 Elsevier Inc. ° °

## 2016-01-16 NOTE — Op Note (Signed)
Patient:  Sheila Garrison  DOB:  06-09-1955  MRN:  [id number]   Preop Diagnosis:  Left breast carcinoma, need for central venous access  Postop Diagnosis:  Same  Procedure:  Port-A-Cath insertion  Surgeon:  Aviva Signs, M.D.  Anes:  Mac  Indications:  Patient is a 60 year old white female who is about to undergo chemotherapy for left breast carcinoma. The risks and benefits of the procedure including bleeding, infection, and pneumothorax were fully explained to the patient, who gave informed consent.  Procedure note:  The patient was placed in the Trendelenburg position after the right upper chest was prepped and draped using usual sterile technique with DuraPrep. Surgical site confirmation was performed. 1% Xylocaine was used for local anesthesia.  An incision was made below the right clavicle. A subcutaneous pocket was formed. A needle is advanced into the right subclavian vein using the Seldinger technique without difficulty. A guidewire was then advanced into the right atrium under fluoroscopic guidance. An introducer peel-away sheath were placed over the guidewire. The catheter was then inserted through the peel-away sheath the peel-away sheath was removed. The catheter was then attached to the port and the port placed in subcutaneous pocket. Adequate positioning was confirmed by fluoroscopy. Good backflow blood was noted on aspiration of the port. A power port was inserted. The port was flushed with heparin flush.  The subcutaneous layer was reapproximated using a 3-0 Vicryl interrupted suture. The skin was closed using a 4 Vicryl subcuticular suture. Dermabond was then applied.  All tape and needle counts were correct at the end of the procedure. The patient was transferred to PACU in stable condition. A chest x-ray will be performed at that time.  Complications:  None  EBL:  Minimal  Specimen:  None

## 2016-01-16 NOTE — Anesthesia Preprocedure Evaluation (Signed)
Anesthesia Evaluation  Patient identified by MRN, date of birth, ID band Patient awake    Reviewed: Allergy & Precautions, H&P , NPO status , Patient's Chart, lab work & pertinent test results  Airway Mallampati: I  TM Distance: >3 FB Neck ROM: Full    Dental  (+) Edentulous Upper, Edentulous Lower   Pulmonary shortness of breath, COPD, Current Smoker, former smoker,    breath sounds clear to auscultation       Cardiovascular hypertension, Pt. on medications  Rhythm:Regular Rate:Normal     Neuro/Psych PSYCHIATRIC DISORDERS Anxiety    GI/Hepatic GERD  Controlled and Medicated,  Endo/Other    Renal/GU      Musculoskeletal   Abdominal   Peds  Hematology   Anesthesia Other Findings   Reproductive/Obstetrics                             Anesthesia Physical Anesthesia Plan  ASA: III  Anesthesia Plan: MAC   Post-op Pain Management:    Induction: Intravenous  Airway Management Planned: Simple Face Mask  Additional Equipment:   Intra-op Plan:   Post-operative Plan:   Informed Consent: I have reviewed the patients History and Physical, chart, labs and discussed the procedure including the risks, benefits and alternatives for the proposed anesthesia with the patient or authorized representative who has indicated his/her understanding and acceptance.     Plan Discussed with:   Anesthesia Plan Comments:         Anesthesia Quick Evaluation

## 2016-01-16 NOTE — Transfer of Care (Signed)
Immediate Anesthesia Transfer of Care Note  Patient: Sheila Garrison  Procedure(s) Performed: Procedure(s): INSERTION PORT-A-CATH (Right)  Patient Location: PACU  Anesthesia Type:MAC  Level of Consciousness: awake, alert , oriented and patient cooperative  Airway & Oxygen Therapy: Patient Spontanous Breathing  Post-op Assessment: Report given to RN, Post -op Vital signs reviewed and stable and Patient moving all extremities X 4  Post vital signs: Reviewed and stable  Last Vitals:  Vitals:   01/16/16 0805 01/16/16 0810  BP: 127/70 108/78  Resp: 18 18  Temp:      Last Pain:  Vitals:   01/16/16 0737  TempSrc: Oral      Patients Stated Pain Goal: 8 ([id number])  Complications: No apparent anesthesia complications

## 2016-01-17 ENCOUNTER — Telehealth (HOSPITAL_COMMUNITY): Payer: Self-pay | Admitting: Hematology & Oncology

## 2016-01-17 ENCOUNTER — Other Ambulatory Visit: Payer: Self-pay | Admitting: Family

## 2016-01-17 ENCOUNTER — Encounter (HOSPITAL_COMMUNITY): Payer: Self-pay | Admitting: General Surgery

## 2016-01-17 NOTE — Telephone Encounter (Signed)
Per Michaelene Song pre Josem Kaufmann is NOT req for a PET  Call ref# Vaughan Basta [id number]/12/17

## 2016-01-18 MED ORDER — LOSARTAN POTASSIUM 100 MG PO TABS: 100.0000 mg | ORAL_TABLET | Freq: Every day | ORAL | 1 refills | Status: DC

## 2016-01-18 MED ORDER — FUROSEMIDE 40 MG PO TABS: 40.0000 mg | ORAL_TABLET | Freq: Every day | ORAL | 1 refills | Status: DC

## 2016-01-18 NOTE — Telephone Encounter (Signed)
done

## 2016-01-19 ENCOUNTER — Ambulatory Visit (HOSPITAL_COMMUNITY): Payer: PRIVATE HEALTH INSURANCE | Admitting: Hematology & Oncology

## 2016-01-19 ENCOUNTER — Inpatient Hospital Stay (HOSPITAL_COMMUNITY): Payer: PRIVATE HEALTH INSURANCE

## 2016-01-19 ENCOUNTER — Ambulatory Visit (HOSPITAL_COMMUNITY): Payer: PRIVATE HEALTH INSURANCE

## 2016-01-23 ENCOUNTER — Ambulatory Visit (HOSPITAL_COMMUNITY)
Admission: RE | Admit: 2016-01-23 | Discharge: 2016-01-23 | Disposition: A | Discharge status: Home / Self Care | Payer: PRIVATE HEALTH INSURANCE | Source: Ambulatory Visit | Attending: Hematology & Oncology | Admitting: Hematology & Oncology

## 2016-01-23 DIAGNOSIS — C50912 Malignant neoplasm of unspecified site of left female breast: Secondary | ICD-10-CM | POA: Diagnosis not present

## 2016-01-23 DIAGNOSIS — C787 Secondary malignant neoplasm of liver and intrahepatic bile duct: Secondary | ICD-10-CM | POA: Diagnosis not present

## 2016-01-23 DIAGNOSIS — C773 Secondary and unspecified malignant neoplasm of axilla and upper limb lymph nodes: Secondary | ICD-10-CM | POA: Insufficient documentation

## 2016-01-23 DIAGNOSIS — R938 Abnormal findings on diagnostic imaging of other specified body structures: Secondary | ICD-10-CM | POA: Diagnosis present

## 2016-01-23 DIAGNOSIS — C50111 Malignant neoplasm of central portion of right female breast: Secondary | ICD-10-CM

## 2016-01-23 DIAGNOSIS — C7951 Secondary malignant neoplasm of bone: Secondary | ICD-10-CM | POA: Diagnosis not present

## 2016-01-23 DIAGNOSIS — R9389 Abnormal findings on diagnostic imaging of other specified body structures: Secondary | ICD-10-CM

## 2016-01-23 LAB — GLUCOSE, CAPILLARY: Glucose-Capillary: 73 mg/dL (ref 65–99)

## 2016-01-23 MED ORDER — FLUDEOXYGLUCOSE F - 18 (FDG) INJECTION
7.6000 | Freq: Once | INTRAVENOUS | Status: AC | PRN
  Administered 2016-01-23: 7.57 via INTRAVENOUS

## 2016-01-24 ENCOUNTER — Ambulatory Visit: Payer: PRIVATE HEALTH INSURANCE | Admitting: Family

## 2016-01-24 ENCOUNTER — Encounter (HOSPITAL_COMMUNITY): Payer: PRIVATE HEALTH INSURANCE | Attending: Hematology & Oncology | Admitting: Hematology & Oncology

## 2016-01-24 ENCOUNTER — Encounter (HOSPITAL_COMMUNITY): Payer: Self-pay | Admitting: Hematology & Oncology

## 2016-01-24 VITALS — BP 149/87 | HR 96 | Temp 98.2°F | Resp 16 | Wt 153.8 lb

## 2016-01-24 DIAGNOSIS — C50111 Malignant neoplasm of central portion of right female breast: Secondary | ICD-10-CM

## 2016-01-24 DIAGNOSIS — C787 Secondary malignant neoplasm of liver and intrahepatic bile duct: Secondary | ICD-10-CM

## 2016-01-24 DIAGNOSIS — C50919 Malignant neoplasm of unspecified site of unspecified female breast: Secondary | ICD-10-CM | POA: Insufficient documentation

## 2016-01-24 DIAGNOSIS — Z170 Estrogen receptor positive status [ER+]: Secondary | ICD-10-CM

## 2016-01-24 DIAGNOSIS — Z720 Tobacco use: Secondary | ICD-10-CM

## 2016-01-24 DIAGNOSIS — M8589 Other specified disorders of bone density and structure, multiple sites: Secondary | ICD-10-CM

## 2016-01-24 DIAGNOSIS — C773 Secondary and unspecified malignant neoplasm of axilla and upper limb lymph nodes: Secondary | ICD-10-CM | POA: Diagnosis not present

## 2016-01-24 DIAGNOSIS — C50612 Malignant neoplasm of axillary tail of left female breast: Secondary | ICD-10-CM

## 2016-01-24 DIAGNOSIS — Z853 Personal history of malignant neoplasm of breast: Secondary | ICD-10-CM

## 2016-01-24 DIAGNOSIS — C50411 Malignant neoplasm of upper-outer quadrant of right female breast: Secondary | ICD-10-CM | POA: Diagnosis not present

## 2016-01-24 DIAGNOSIS — C7951 Secondary malignant neoplasm of bone: Secondary | ICD-10-CM | POA: Insufficient documentation

## 2016-01-24 NOTE — Patient Instructions (Signed)
[place]   CHEMOTHERAPY INSTRUCTIONS   POTENTIAL SIDE EFFECTS OF TREATMENT:       EDUCATIONAL MATERIALS GIVEN AND REVIEWED:    SELF CARE ACTIVITIES WHILE ON CHEMOTHERAPY:    MEDICATIONS:   SYMPTOMS TO REPORT AS SOON AS POSSIBLE AFTER TREATMENT:  FEVER GREATER THAN 100.5 F  CHILLS WITH OR WITHOUT FEVER  NAUSEA AND VOMITING THAT IS NOT CONTROLLED WITH YOUR NAUSEA MEDICATION  UNUSUAL SHORTNESS OF BREATH  UNUSUAL BRUISING OR BLEEDING  TENDERNESS IN MOUTH AND THROAT WITH OR WITHOUT PRESENCE OF ULCERS  URINARY PROBLEMS  BOWEL PROBLEMS  UNUSUAL RASH    Wear comfortable clothing and clothing appropriate for easy access to any Portacath or PICC line. Let us know if there is anything that we can do to make your therapy better!      I have been informed and understand all of the instructions given to me and have received a copy. I have been instructed to call the clinic [phone] or my family physician as soon as possible for continued medical care, if indicated. I do not have any more questions at this time but understand that I may call the [place] or the Patient Navigator at [phone] during office hours should I have questions or need assistance in obtaining follow-up care.

## 2016-01-24 NOTE — Progress Notes (Signed)
[place]  PROGRESS NOTE  Patient Care Team: [name], FNP as PCP - General (Nurse Practitioner) Everardo All, MD (Hematology and Oncology) Eppie Gibson, MD (Radiation Oncology) Neldon Mc, MD (General Surgery)  CHIEF COMPLAINTS/PURPOSE OF CONSULTATION:     Stage III (T4N3) R Br cancer    06/21/2010 Initial Diagnosis    Stage III (T4N3) R Br cancer       01/04/2011 - 02/22/2011 Radiation Therapy    50 Gy       05/07/2014 Pathology Results    BCI testing- low risk of late recurrence (3.4% between years 5-10), high likelihood of benefit from extended endocrein therapy, and a 67% relative risk reduction when treated with extended endocrine therapy (27% versus 10.5%).      01/23/2016 PET scan    1. Widespread multifocal metastatic disease in the visualized axial and appendicular skeleton. 2. Solitary metastatic lesion in the liver. Hypermetabolic irregular left axillary lymph node adjacent to the left axillary clips. 3. Activity in the left breast related to mastectomy. 4. Low-grade activity along the pleuroparenchymal thickening anteriorly in the right chest is probably related to prior therapy rather than current malignancy. 5. Probable mild compression fracture at T8 associated with the extensive tumor at this level. There is also bony destruction of the right ninth rib posteriorly. 6. Faintly hypermetabolic but small left external iliac lymph nodes merit surveillance  ADDENDUM: The original report was by Dr. Van Clines. The following addendum is by Dr. Van Clines:  I have one other musculoskeletal focus of hypermetabolic activity to mention. In the right latissimus dorsi muscle, there is a small hypermetabolic focus with maximum SUV of 5.4, measuring 7 mm in diameter on image 89/4, compatible with a metastatic deposit.        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 2. Ultrasound-guided biopsy of the irregular mass within the INNER left breast at the 9 o'clock axis. 3. Ultrasound-guided biopsy of 1 of the enlarged and amorphous lymph nodes in the left axilla.      11/22/2015 Imaging    Ultrasound-guided biopsy of the irregular hypoechoic area, with prominent shadowing, in the OUTER left breast at the 1 o'clock axis. 2. Ultrasound-guided biopsy of the irregular mass within the INNER left breast at the 9 o'clock axis. 3. Ultrasound-guided biopsy of 1 of the enlarged and amorphous lymph nodes in the left axilla.      12/16/2015 Surgery    L modified radical mastectomy with Dr. Arnoldo Morale      12/18/2015 - 12/21/2015 Hospital Admission    Admit date: 12/18/2015 Admission diagnosis: altered MS, acute respiratory failure Additional comments: This likely was a combination of COPD exacerbation along with mild acute on chronic diastolic CHF with  echo shows a preserved EF of around 60%, he was treated with IV steroids and Lasix, she is much better but qualifies for home oxygen which will be provided      12/19/2015 Imaging    No evidence of pulmonary embolus. 2. Small right pleural effusion, with associated atelectasis. Peripheral scarring at the anterior right upper lobe. Mild bilateral emphysema noted. 3. Scattered coronary artery calcifications seen. 4. Postoperative change at the left chest wall, reflecting recent mastectomy, with vague collections of postoperative fluid seen. 5. Vague sclerotic change and heterogeneity at vertebral body T8 raises concern for  sequelae of metastatic disease       HISTORY OF PRESENTING ILLNESS:  Sheila Garrison 60 y.o. 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  Margot Chimes, systemic chemotherapy by Dr. Jana Hakim in 2012 and Herceptin x 52 weeks finishing on 07/10/2011, XRT (01/04/2011- 02/22/2011), and anti-endocrine therapy. AND Invasive lobular left breast cancer, Stage IIB (pT3pN2A), S/P left mastectomy by Dr. Arnoldo Morale on 12/16/2015, ER/PR+ 2%.  HER2 NEGATIVE.    Here today to review recent PET imaging from 01/23/2016.   Her PET scan is reviewed and she is advised about the findings, which are consistent with metastatic disease (Stage IV).  We discussed the incurability of this disease, yet it is treatable.  She is provided education regarding the role of palliative treatment for her cancer.  She will need biopsy as her two breast cancers are different, in addition, prognostic markers will need to be determined.  She is taking the news fairly well. She notes that she is going to stay positive. She does note some mild back pain. Appetite is ok.   MEDICAL HISTORY:  Past Medical History:  Diagnosis Date  . Anxiety   . Breast CA ([place]) 07/01/2010   rt breast ca  . COPD (chronic obstructive pulmonary disease) ([place])   . Family history of breast cancer    Aunt on father's side  . Hyperlipemia   . Hypertension   . MRSA (methicillin resistant staph aureus) culture positive    in breast  . 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 Nipple discharge    with pain, infection, lump  . S/P radiation therapy 01/08/11 - 02/22/11   Right Chest Wall/ 50 Gy / 25 Fractions, Right [place]/ 50 gy/25 Fractions, Right Axillary Boost/500 cGy/25 Fractions, Right Chest Wall Boost/10 Gy/5 Fractions  . Status post chemotherapy    6 cycles of carboplatin and docetaxel with trastuzumab every 21 days followed by surgery  . Tobacco abuse 06/22/2015  . Use of letrozole ([place]) 03/09/2011  . Wears dentures     SURGICAL HISTORY: Past Surgical History:  Procedure Laterality Date  . BREAST SURGERY  2012  . DILATION AND CURETTAGE OF UTERUS    . MASTECTOMY MODIFIED RADICAL Left  12/16/2015   Procedure: MASTECTOMY MODIFIED RADICAL;  Surgeon: Aviva Signs, MD;  Location: [facility];  Service: General;  Laterality: Left;  . port a catheter insertion    . PORT-A-CATH REMOVAL Left 01/15/2013   Procedure: REMOVAL PORT-A-CATH;  Surgeon: Scherry Ran, MD;  Location: [facility];  Service: General;  Laterality: Left;  . PORTACATH PLACEMENT Right 01/16/2016   Procedure: INSERTION PORT-A-CATH;  Surgeon: Aviva Signs, MD;  Location: [facility];  Service: General;  Laterality: Right;  . TUBAL LIGATION      SOCIAL HISTORY: Social History   Social History  . Marital status: Married    Spouse name: N/A  . Number of children: N/A  . Years of education: N/A   Occupational History  . Not on file.   Social History Main Topics  . Smoking status: Former Smoker    Packs/day: 0.25    Years: 30.00    Types: Cigarettes    Quit date: 12/15/2015  . Smokeless tobacco: Never Used  . Alcohol use No  . Drug use: No  . Sexual activity: Yes    Birth control/ protection: Post-menopausal   Other Topics Concern  . Not  on file   Social History Narrative  . No narrative on file    FAMILY HISTORY: Family History  Problem Relation Age of Onset  . COPD Mother   . Diabetes Father   . Hypertension Father   . COPD Brother   . Cancer Maternal Aunt     ovarian - died of old age  . Cancer Paternal Aunt     had breast ca; died of of a different cancer  . Cancer Brother   No family history of breast cancer Maternal aunt had ovarian cancer Brother has lung cancer; heavy smoker  ALLERGIES:  has No Known Allergies.  MEDICATIONS:  Current Outpatient Prescriptions  Medication Sig Dispense Refill  . albuterol (PROVENTIL HFA;VENTOLIN HFA) 108 (90 Base) MCG/ACT inhaler Inhale 2 puffs into the lungs every 6 (six) hours as needed for wheezing or shortness of breath.    . Cholecalciferol (VITAMIN D) 2000 units tablet Take 2,000 Units by mouth 2 (two) times daily.    . clobetasol cream (TEMOVATE)  0.24 % Apply 1 application topically as needed. rash (Patient taking differently: Apply 1 application topically daily as needed (rash). rash) 60 g 2  . fexofenadine (ALLEGRA) 180 MG tablet Take 180 mg by mouth daily.      . furosemide (LASIX) 40 MG tablet Take 1 tablet (40 mg total) by mouth daily. 90 tablet 1  . Ginkgo Biloba 120 MG TABS Take 120 mg by mouth daily.    Marland Kitchen letrozole (FEMARA) 2.5 MG tablet Take 1 tablet (2.5 mg total) by mouth daily. 90 tablet 3  . LORazepam (ATIVAN) 0.5 MG tablet TAKE ONE TABLET BY MOUTH EVERY 8 HOURS AS NEEDED (Patient taking differently: TAKE ONE TABLET BY MOUTH EVERY 8 HOURS AS NEEDED FOR ANXIETY) 90 tablet 2  . losartan (COZAAR) 100 MG tablet Take 1 tablet (100 mg total) by mouth daily. 90 tablet 1  . Multiple Vitamin (MULTIVITAMIN) capsule Take 1 capsule by mouth daily.      . Multiple Vitamins-Minerals (EMERGEN-C IMMUNE PO) Take 1 tablet by mouth daily.    . naproxen sodium (ANAPROX) 220 MG tablet Take 220 mg by mouth 2 (two) times daily as needed (pain). pain    . omeprazole (PRILOSEC OTC) 20 MG tablet Take 40 mg by mouth daily.    . ondansetron (ZOFRAN ODT) 4 MG disintegrating tablet Take 1 tablet (4 mg total) by mouth every 8 (eight) hours as needed for nausea or vomiting. 20 tablet 1  . potassium chloride SA (K-DUR,KLOR-CON) 20 MEQ tablet Take 1 tablet (20 mEq total) by mouth 2 (two) times daily. 180 tablet 3  . predniSONE (DELTASONE) 5 MG tablet Label  & dispense according to the schedule below. 10 Pills PO for 3 days then, 8 Pills PO for 3 days, 6 Pills PO for 3 days, 4 Pills PO for 3 days, 2 Pills PO for 3 days, 1 Pills PO for 3 days, 1/2 Pill  PO for 3 days then STOP. Total 95 pills. 95 tablet 0  . rosuvastatin (CRESTOR) 20 MG tablet Take 1 tablet (20 mg total) by mouth daily. 90 tablet 3   No current facility-administered medications for this visit.     Review of Systems  Constitutional: Negative.   HENT: Negative.   Eyes: Negative.   Respiratory:  Negative.   Cardiovascular: Negative.   Gastrointestinal: Negative.   Genitourinary: Negative.   Musculoskeletal: Positive for back pain (thoracic area).  Skin: Negative.   Neurological: Positive for tingling.  Neuropathy in feet from chemotherapy  Endo/Heme/Allergies: Negative.   Psychiatric/Behavioral: Negative.   All other systems reviewed and are negative. 14 point ROS was done and is otherwise as detailed above or in HPI   PHYSICAL EXAMINATION: ECOG PERFORMANCE STATUS: 1 - Symptomatic but completely ambulatory  Vitals:   01/24/16 1643  BP: (!) 149/87  Pulse: 96  Resp: 16  Temp: 98.2 F (36.8 C)   Filed Weights   01/24/16 1643  Weight: 153 lb 12.8 oz (69.8 kg)    Physical Exam  Constitutional: She is oriented to person, place, and time and well-developed, well-nourished, and in no distress.  HENT:  Head: Normocephalic and atraumatic.  Mouth/Throat: Oropharynx is clear and moist.  Eyes: Conjunctivae and EOM are normal. Pupils are equal, round, and reactive to light. No scleral icterus.  Neck: Normal range of motion. Neck supple.  Cardiovascular: Normal rate, regular rhythm and normal heart sounds.   Pulmonary/Chest: Effort normal and breath sounds normal. No respiratory distress. She has no wheezes. She has no rales.  R mastectomy site well healed L mastectomy site post surgery, mild swelling, echymoses   Abdominal: Soft. Bowel sounds are normal. She exhibits no distension and no mass. There is no tenderness. There is no rebound and no guarding.  Musculoskeletal: Normal range of motion. She exhibits no edema.  Lymphadenopathy:    She has no cervical adenopathy.  Neurological: She is alert and oriented to person, place, and time. Gait normal.  Skin: Skin is warm and dry.  Psychiatric: Mood, memory, affect and judgment normal.  Nursing note and vitals reviewed.   LABORATORY DATA:  I have reviewed the data as listed Lab Results  Component Value Date   WBC  17.2 (H) 12/27/2015   HGB 12.5 12/19/2015   HCT 41.3 12/27/2015   MCV 92 12/27/2015   PLT 406 (H) 12/27/2015   CMP     Component Value Date/Time   NA 140 12/27/2015 1553   K 4.7 12/27/2015 1553   CL 94 (L) 12/27/2015 1553   CO2 28 12/27/2015 1553   GLUCOSE 132 (H) 12/27/2015 1553   GLUCOSE 110 (H) 12/19/2015 0652   BUN 22 12/27/2015 1553   CREATININE 0.47 (L) 12/27/2015 1553   CALCIUM 9.3 12/27/2015 1553   PROT 7.1 12/13/2015 1419   PROT 7.0 10/07/2015 0859   ALBUMIN 4.1 12/13/2015 1419   ALBUMIN 4.6 10/07/2015 0859   AST 36 12/13/2015 1419   ALT 21 12/13/2015 1419   ALKPHOS 89 12/13/2015 1419   BILITOT 0.3 12/13/2015 1419   BILITOT 0.2 10/07/2015 0859   GFRNONAA 106 12/27/2015 1553   GFRAA 122 12/27/2015 1553     RADIOGRAPHIC STUDIES: I have personally reviewed the radiological images as listed and agreed with the findings in the report.     Study Result   CLINICAL DATA:: CLINICAL DATA: History of right breast cancer in 2012 status post mastectomy. Patient presents today with a new left breast lump.  EXAM: 2D DIGITAL DIAGNOSTIC LEFT MAMMOGRAM WITH CAD AND ADJUNCT TOMO  ULTRASOUND LEFT BREAST  COMPARISON:  Previous exam(s).  ACR Breast Density Category b: There are scattered areas of fibroglandular density.  FINDINGS: There is an new asymmetry/distortion within the upper-outer quadrant of the left breast, corresponding to the site of patient's palpable lump, with overlying skin marker in place.  An additional smaller focus of distortion is seen within the inner left breast, tomosynthesis CC slice CT.  There is also diffuse trabecular thickening throughout the left breast, most  prominent within the central and upper aspects of the left breast, and diffuse skin thickening, finding concerning for inflammatory breast cancer.  Mammographic images were processed with CAD.  On physical exam, there is a palpable mass within the  upper-outer quadrant of the left breast.  Targeted ultrasound is performed, showing an irregular hypoechoic area at the 1 o'clock axis, 5 cm from the nipple, with prominent shadowing, measuring 2.7 x 1.1 x 1.7 cm, corresponding to the mammographic finding. An additional irregular hypoechoic mass is appreciated within the adjacent outer left breast, 3 o'clock axis, 3 cm from the nipple, measuring 1.2 x 1.2 cm.  There is also an irregular hypoechoic mass within the inner left breast at the 9 o'clock axis, 5 cm from the nipple, measuring 1 x 0.7 x 0.6 cm, corresponding to the mammographic finding.  Left axillary ultrasound shows at least 3 enlarged and/or amorphous lymph nodes, suspicious for metastatic lymphadenopathy.  IMPRESSION: 1. Irregular hypoechoic area within the OUTER left breast at the 1 o'clock axis, 5 cm from the nipple, with prominent shadowing, measuring 2.7 x 1.1 x 1.7 cm, corresponding to the area of suspicious asymmetry/distortion identified on mammogram and corresponding to the area of patient's palpable lump. This is a highly suspicious finding for which ultrasound-guided biopsy is recommended. 2. Additional irregular hypoechoic mass within the INNER left breast at the 9 o'clock axis, 5 cm from the nipple, measuring 1 x 0.7 x 0.6 cm, corresponding to the smaller area of distortion identified within the inner left breast on mammogram. This is also a highly suspicious finding for which ultrasound-guided biopsy is recommended. 3. Multiple enlarged and amorphous lymph nodes in the left axilla. Ultrasound-guided biopsy of 1 of these lymph nodes is recommended. 4. Diffuse trabecular thickening and skin thickening suggesting a diffuse inflammatory breast cancer.  RECOMMENDATION: 1. Ultrasound-guided biopsy of the irregular hypoechoic area, with prominent shadowing, in the OUTER left breast at the 1 o'clock axis. 2. Ultrasound-guided biopsy of the irregular mass  within the INNER left breast at the 9 o'clock axis. 3. Ultrasound-guided biopsy of 1 of the enlarged and amorphous lymph nodes in the left axilla.  Ordering physician will be contacted with today's results and patient will then be scheduled for ultrasound-guided biopsies at her earliest convenience.  I have discussed the findings and recommendations with the patient. Results were also provided in writing at the conclusion of the visit. If applicable, a reminder letter will be sent to the patient regarding the next appointment.  BI-RADS CATEGORY  5: Highly suggestive of malignancy.   Electronically Signed   By: Franki Cabot M.D.   On: 11/22/2015 14:47    CLINICAL DATA:  Acute onset of shortness of breath and decreased O2 saturation. Altered mental status. Initial encounter.  EXAM: CT ANGIOGRAPHY CHEST WITH CONTRAST  TECHNIQUE: Multidetector CT imaging of the chest was performed using the standard protocol during bolus administration of intravenous contrast. Multiplanar CT image reconstructions and MIPs were obtained to evaluate the vascular anatomy.  CONTRAST:  100 mL of Isovue 370 IV contrast  COMPARISON:  CT of the chest performed 07/05/2010  FINDINGS: Cardiovascular:  There is no evidence of pulmonary embolus.  The heart is unremarkable in appearance. The thoracic aorta is grossly intact, with mild calcification along the aortic arch and proximal great vessels.  Mediastinum/Nodes: Scattered coronary artery calcifications are seen. No mediastinal lymphadenopathy is appreciated. No pericardial effusion is identified. The thyroid gland is unremarkable. No axillary lymphadenopathy is seen. Postoperative soft tissue air is  noted at the left axilla.  Lungs/Pleura: A small right pleural effusion is noted, with associated atelectasis. Peripheral scarring is noted at the anterior right upper lobe. Mild bilateral emphysema is noted. No dominant mass is  seen. There is no evidence of pneumothorax.  Upper Abdomen: The visualized portions of the liver and spleen are grossly unremarkable. The visualized portions of the pancreas, adrenal glands and kidneys are unremarkable.  Musculoskeletal: No acute osseous abnormalities are identified. Vague sclerotic change and heterogeneity at vertebral body T8 raises concern for sequelae of metastatic disease. The visualized musculature is unremarkable in appearance.  The patient is status post bilateral mastectomy, with skin staples noted on the left. Vague collections of fluid along the left chest wall are likely postoperative in nature. An associated drain is noted along the left chest wall.  Review of the MIP images confirms the above findings.  IMPRESSION: 1. No evidence of pulmonary embolus. 2. Small right pleural effusion, with associated atelectasis. Peripheral scarring at the anterior right upper lobe. Mild bilateral emphysema noted. 3. Scattered coronary artery calcifications seen. 4. Postoperative change at the left chest wall, reflecting recent mastectomy, with vague collections of postoperative fluid seen. 5. Vague sclerotic change and heterogeneity at vertebral body T8 raises concern for sequelae of metastatic disease. Would correlate with the patient's history.   Electronically Signed   By: Garald Balding M.D.   On: 12/19/2015 00:17  ADDENDUM REPORT: 01/23/2016 15:36  ADDENDUM: The original report was by Dr. Van Clines. The following addendum is by Dr. Van Clines:  I have one other musculoskeletal focus of hypermetabolic activity to mention. In the right latissimus dorsi muscle, there is a small hypermetabolic focus with maximum SUV of 5.4, measuring 7 mm in diameter on image 89/4, compatible with a metastatic deposit.   Electronically Signed   By: Van Clines M.D.   On: 01/23/2016 15:36   Addended by Sherryl Barters, MD on 01/23/2016  3:38 PM    Study Result   CLINICAL DATA:  Subsequent treatment strategy for breast cancer, previous on the right, recent diagnosis on the left.  EXAM: NUCLEAR MEDICINE PET SKULL BASE TO THIGH  TECHNIQUE: 7.6 mCi F-18 FDG was injected intravenously. Full-ring PET imaging was performed from the skull base to thigh after the radiotracer. CT data was obtained and used for attenuation correction and anatomic localization.  FASTING BLOOD GLUCOSE:  Value: 73 mg/dl  COMPARISON:  Multiple exams, including 06/27/2010 and 12/18/2015  FINDINGS: NECK  No hypermetabolic lymph nodes in the neck.  CHEST  Hypermetabolic activity along the postoperative findings in the left axilla within irregular lymph node measuring 1.2 cm in short axis on image 53/4 having maximum standard uptake value of 7.3. Metabolic activity in the left breast related to recent mastectomy. There is some pleural thickening along the right anterior chest with maximum SUV of approximately 4.3, possibly rib reflecting prior radiation port. 3 mm right lower lobe nodule on image 54/8, nonspecific.  ABDOMEN/PELVIS  A metastatic lesion in the right hepatic lobe measuring approximately 2 cm in diameter based on the metabolic activity has a maximum SUV of 13.9. Background liver activity approximately 4.4  There is some faintly hypermetabolic left external iliac nodes. For example, an 8 mm in short axis node on image 163 of series 4 of the CT data has a maximum SUV of 3.5.  SKELETON  Widespread osseous metastatic disease including the left side of the C1 vertebra, sternum, numerous additional vertebral levels especially T8, multiple destructive rib lesions  especially the right ninth rib posteriorly, and multiple lesions in the sacrum, iliac bones, left ischium, and greater trochanters. The T8 vertebral lesion has maximum SUV of 12.2 and the index left sacral lesion has a maximum SUV of 10.7. Currently the  hip lesions are not of a size to be threatening for fracture.  IMPRESSION: 1. Widespread multifocal metastatic disease in the visualized axial and appendicular skeleton. 2. Solitary metastatic lesion in the liver. Hypermetabolic irregular left axillary lymph node adjacent to the left axillary clips. 3. Activity in the left breast related to mastectomy. 4. Low-grade activity along the pleuroparenchymal thickening anteriorly in the right chest is probably related to prior therapy rather than current malignancy. 5. Probable mild compression fracture at T8 associated with the extensive tumor at this level. There is also bony destruction of the right ninth rib posteriorly. 6. Faintly hypermetabolic but small left external iliac lymph nodes merit surveillance.  Electronically Signed: By: Van Clines M.D. On: 01/23/2016 14:54       PATHOLOGY:      ASSESSMENT & PLAN: Stage III T3N2 L Br Cancer ER+ 2%, Her 2 -  Stage III (T4N3) R Br cancer ER+ 53% HER 2 + by [facility]   - Clinical: Stage IIIC (T4, N3b, cM0)  COPD AI therapy since 2012 BCI testing showing benefit from prolonged therapy PET imaging c/w stage IV disease  I personally reviewed and went over radiographic studies with the patient.  The results are noted within this dictation.   PET scan demonstrates metastatic disease to multiple regions including bone and liver.  I have recommended biopsy proving Stage IV disease, for histology and prognostic markers.  We reviewed the benefits of biopsy for treatment decisions and she understands this and is willing to proceed.  She is provided reading material.  Order is place for MRI T-spine to further evaluate T8 lesion with pathologic fracture.  Order is placed for CT/US guided biopsy.  Message is sent to Dr. Geroge Baseman for review of the case and to recommend best place for biopsy.  He recommended an US biopsy of hepatic lesion.  RTC 4 days post-biopsy, for path review and  to discuss treatment options.  Orders Placed This Encounter  Procedures  . MR Thoracic Spine W Wo Contrast    Standing Status:   Future    Number of Occurrences:   1    Standing Expiration Date:   01/23/2017    Order Specific Question:   GRA to provide read?    Answer:   Yes    Order Specific Question:   If indicated for the ordered procedure, I authorize the administration of contrast media per Radiology protocol    Answer:   Yes    Order Specific Question:   Reason for Exam (SYMPTOM  OR DIAGNOSIS REQUIRED)    Answer:   metastatic disease to T spine, breast cancer    Order Specific Question:   Preferred imaging location?    Answer:   [facility] (table limit-350lbs)    Order Specific Question:   What is the patient's sedation requirement?    Answer:   No Sedation    Order Specific Question:   Does the patient have a pacemaker or implanted devices?    Answer:   No    This document serves as a record of services personally performed by Ancil Linsey, MD. It was created on her behalf by Elmyra Ricks, a trained medical scribe. The creation of this record is based on  the scribe's personal observations and the provider's statements to them. This document has been checked and approved by the attending provider.  I have reviewed the above documentation for accuracy and completeness and I agree with the above.  This note was electronically signed.    Patrici Ranks, MD 01/28/2016 9:21 AM

## 2016-01-24 NOTE — Patient Instructions (Addendum)
[place] at [facility] Discharge Instructions  RECOMMENDATIONS MADE BY THE CONSULTANT AND ANY TEST RESULTS WILL BE SENT TO YOUR REFERRING PHYSICIAN.  You saw Dr.Penland today. See Amy at checkout for appointments. You will be scheduled for an MRI of the T spine, You may need XRT WE discussed this today. You will be scheduled for a biopsy to confirm metastatic breast cancer. You were given information about stage IV breast cancer. We discussed Brock Bad  Thank you for choosing [place] at [facility] to provide your oncology and hematology care.  To afford each patient quality time with our provider, please arrive at least 15 minutes before your scheduled appointment time.   Beginning January 23rd 2017 lab work for the [facility] will be done in the  Main lab at [facility] on 1st floor. If you have a lab appointment with the [facility] please come in thru the  Main Entrance and check in at the main information desk  You need to re-schedule your appointment should you arrive 10 or more minutes late.  We strive to give you quality time with our providers, and arriving late affects you and other patients whose appointments are after yours.  Also, if you no show three or more times for appointments you may be dismissed from the clinic at the providers discretion.     Again, thank you for choosing [facility].  Our hope is that these requests will decrease the amount of time that you wait before being seen by our physicians.       _____________________________________________________________  Should you have questions after your visit to [facility], please contact our office at [phone] between the hours of 8:30 a.m. and 4:30 p.m.  Voicemails left after 4:30 p.m. will not be returned until the following business day.  For prescription refill requests, have your pharmacy contact our office.          Resources For Cancer Patients and their Caregivers ? American Cancer Society: Can assist with transportation, wigs, general needs, runs Look Good Feel Better.        8283803689 ? Cancer Care: Provides financial assistance, online support groups, medication/co-pay assistance.  1-800-813-HOPE (252) 207-9745) ? [facility] Assists [place] cancer patients and their families through emotional , educational and financial support.  214-255-1572 ? [place] DSS Where to apply for food stamps, Medicaid and utility assistance. 272-307-1229 ? [facility]: Transportation to medical appointments. 5303657378 ? Social Security Administration: May apply for disability if have a Stage IV cancer. 623-329-3519 (740) 683-5209 ? [employer], Disability and Transit Services: Assists with nutrition, care and transit needs. [place] Support Programs: @10RELATIVEDAYS @ > Cancer Support Group  2nd Tuesday of the month 1pm-2pm, Journey Room  > Creative Journey  3rd Tuesday of the month 1130am-1pm, Journey Room  > Look Good Feel Better  1st Wednesday of the month 10am-12 noon, Journey Room (Call [place] to register [phone])

## 2016-01-25 ENCOUNTER — Encounter: Payer: Self-pay | Admitting: Family

## 2016-01-26 ENCOUNTER — Other Ambulatory Visit (HOSPITAL_COMMUNITY): Payer: Self-pay | Admitting: Oncology

## 2016-01-26 ENCOUNTER — Ambulatory Visit (HOSPITAL_COMMUNITY): Payer: PRIVATE HEALTH INSURANCE | Admitting: Oncology

## 2016-01-26 ENCOUNTER — Encounter: Payer: Self-pay | Admitting: *Deleted

## 2016-01-26 ENCOUNTER — Encounter (HOSPITAL_COMMUNITY): Payer: Self-pay | Admitting: Emergency Medicine

## 2016-01-26 ENCOUNTER — Ambulatory Visit (HOSPITAL_COMMUNITY): Payer: PRIVATE HEALTH INSURANCE

## 2016-01-26 ENCOUNTER — Ambulatory Visit: Payer: PRIVATE HEALTH INSURANCE | Admitting: Family

## 2016-01-26 DIAGNOSIS — C50111 Malignant neoplasm of central portion of right female breast: Secondary | ICD-10-CM

## 2016-01-26 NOTE — Progress Notes (Unsigned)
US biopsy

## 2016-01-31 ENCOUNTER — Telehealth (HOSPITAL_COMMUNITY): Payer: Self-pay | Admitting: Emergency Medicine

## 2016-01-31 NOTE — Telephone Encounter (Signed)
Notified that I made her a follow up appt to see the doctor after her biopsy.  02/15/2016 at 1:20 pm.  She verbalized understanding.

## 2016-02-01 ENCOUNTER — Telehealth (HOSPITAL_COMMUNITY): Payer: Self-pay | Admitting: Hematology & Oncology

## 2016-02-01 ENCOUNTER — Ambulatory Visit (HOSPITAL_COMMUNITY): Payer: PRIVATE HEALTH INSURANCE

## 2016-02-01 NOTE — Telephone Encounter (Signed)
Pc to [place] [facility] with Clyda Hurdle she stated that the pts plan does not req auth for out pt scans only inpt.  CALL REF# [id number]

## 2016-02-02 ENCOUNTER — Ambulatory Visit (HOSPITAL_COMMUNITY)
Admission: RE | Admit: 2016-02-02 | Discharge: 2016-02-02 | Disposition: A | Discharge status: Home / Self Care | Payer: PRIVATE HEALTH INSURANCE | Source: Ambulatory Visit | Attending: Hematology & Oncology | Admitting: Hematology & Oncology

## 2016-02-02 ENCOUNTER — Ambulatory Visit (HOSPITAL_COMMUNITY): Payer: PRIVATE HEALTH INSURANCE | Admitting: Oncology

## 2016-02-02 DIAGNOSIS — C7951 Secondary malignant neoplasm of bone: Secondary | ICD-10-CM | POA: Insufficient documentation

## 2016-02-02 DIAGNOSIS — C50919 Malignant neoplasm of unspecified site of unspecified female breast: Secondary | ICD-10-CM | POA: Diagnosis present

## 2016-02-02 DIAGNOSIS — M8458XA Pathological fracture in neoplastic disease, other specified site, initial encounter for fracture: Secondary | ICD-10-CM | POA: Diagnosis not present

## 2016-02-02 MED ORDER — GADOBENATE DIMEGLUMINE 529 MG/ML IV SOLN
15.0000 mL | Freq: Once | INTRAVENOUS | Status: AC | PRN
  Administered 2016-02-02: 15 mL via INTRAVENOUS

## 2016-02-09 ENCOUNTER — Other Ambulatory Visit: Payer: Self-pay | Admitting: Radiology

## 2016-02-09 ENCOUNTER — Ambulatory Visit (HOSPITAL_COMMUNITY): Payer: PRIVATE HEALTH INSURANCE | Admitting: Hematology & Oncology

## 2016-02-09 ENCOUNTER — Other Ambulatory Visit: Payer: Self-pay | Admitting: Student

## 2016-02-09 ENCOUNTER — Ambulatory Visit (HOSPITAL_COMMUNITY): Payer: PRIVATE HEALTH INSURANCE

## 2016-02-10 ENCOUNTER — Ambulatory Visit (HOSPITAL_COMMUNITY)
Admission: RE | Admit: 2016-02-10 | Discharge: 2016-02-10 | Disposition: A | Discharge status: Home / Self Care | Payer: PRIVATE HEALTH INSURANCE | Source: Ambulatory Visit | Attending: Oncology | Admitting: Oncology

## 2016-02-10 ENCOUNTER — Ambulatory Visit (HOSPITAL_COMMUNITY)
Admission: RE | Admit: 2016-02-10 | Discharge: 2016-02-10 | Disposition: A | Discharge status: Home / Self Care | Payer: PRIVATE HEALTH INSURANCE | Source: Ambulatory Visit | Attending: Hematology & Oncology | Admitting: Hematology & Oncology

## 2016-02-10 ENCOUNTER — Encounter (HOSPITAL_COMMUNITY): Payer: Self-pay

## 2016-02-10 DIAGNOSIS — Z9012 Acquired absence of left breast and nipple: Secondary | ICD-10-CM | POA: Diagnosis not present

## 2016-02-10 DIAGNOSIS — Z79811 Long term (current) use of aromatase inhibitors: Secondary | ICD-10-CM | POA: Diagnosis not present

## 2016-02-10 DIAGNOSIS — Z923 Personal history of irradiation: Secondary | ICD-10-CM | POA: Diagnosis not present

## 2016-02-10 DIAGNOSIS — E785 Hyperlipidemia, unspecified: Secondary | ICD-10-CM | POA: Diagnosis not present

## 2016-02-10 DIAGNOSIS — J449 Chronic obstructive pulmonary disease, unspecified: Secondary | ICD-10-CM | POA: Diagnosis not present

## 2016-02-10 DIAGNOSIS — Z853 Personal history of malignant neoplasm of breast: Secondary | ICD-10-CM | POA: Diagnosis not present

## 2016-02-10 DIAGNOSIS — Z9221 Personal history of antineoplastic chemotherapy: Secondary | ICD-10-CM | POA: Insufficient documentation

## 2016-02-10 DIAGNOSIS — G629 Polyneuropathy, unspecified: Secondary | ICD-10-CM | POA: Insufficient documentation

## 2016-02-10 DIAGNOSIS — C7951 Secondary malignant neoplasm of bone: Secondary | ICD-10-CM | POA: Insufficient documentation

## 2016-02-10 DIAGNOSIS — C773 Secondary and unspecified malignant neoplasm of axilla and upper limb lymph nodes: Secondary | ICD-10-CM | POA: Insufficient documentation

## 2016-02-10 DIAGNOSIS — Z79899 Other long term (current) drug therapy: Secondary | ICD-10-CM | POA: Diagnosis not present

## 2016-02-10 DIAGNOSIS — K769 Liver disease, unspecified: Secondary | ICD-10-CM | POA: Diagnosis present

## 2016-02-10 DIAGNOSIS — C50912 Malignant neoplasm of unspecified site of left female breast: Secondary | ICD-10-CM | POA: Diagnosis not present

## 2016-02-10 DIAGNOSIS — Z803 Family history of malignant neoplasm of breast: Secondary | ICD-10-CM | POA: Insufficient documentation

## 2016-02-10 DIAGNOSIS — I10 Essential (primary) hypertension: Secondary | ICD-10-CM | POA: Diagnosis not present

## 2016-02-10 DIAGNOSIS — C787 Secondary malignant neoplasm of liver and intrahepatic bile duct: Secondary | ICD-10-CM | POA: Insufficient documentation

## 2016-02-10 DIAGNOSIS — M549 Dorsalgia, unspecified: Secondary | ICD-10-CM | POA: Diagnosis not present

## 2016-02-10 DIAGNOSIS — C50111 Malignant neoplasm of central portion of right female breast: Secondary | ICD-10-CM

## 2016-02-10 LAB — CBC
HCT: 39.4 % (ref 36.0–46.0)
HEMOGLOBIN: 12.5 g/dL (ref 12.0–15.0)
MCH: 29.6 pg (ref 26.0–34.0)
MCHC: 31.7 g/dL (ref 30.0–36.0)
MCV: 93.4 fL (ref 78.0–100.0)
PLATELETS: 312 10*3/uL (ref 150–400)
RBC: 4.22 MIL/uL (ref 3.87–5.11)
RDW: 15.5 % (ref 11.5–15.5)
WBC: 9 10*3/uL (ref 4.0–10.5)

## 2016-02-10 LAB — COMPREHENSIVE METABOLIC PANEL
ALBUMIN: 3.7 g/dL (ref 3.5–5.0)
ALT: 22 U/L (ref 14–54)
ANION GAP: 11 (ref 5–15)
AST: 48 U/L — ABNORMAL HIGH (ref 15–41)
Alkaline Phosphatase: 123 U/L (ref 38–126)
BILIRUBIN TOTAL: 0.3 mg/dL (ref 0.3–1.2)
BUN: 14 mg/dL (ref 6–20)
CHLORIDE: 102 mmol/L (ref 101–111)
CO2: 28 mmol/L (ref 22–32)
Calcium: 9.3 mg/dL (ref 8.9–10.3)
Creatinine, Ser: 0.46 mg/dL (ref 0.44–1.00)
GFR calc Af Amer: >60 mL/min (ref >60)
GFR calc non Af Amer: >60 mL/min (ref >60)
GLUCOSE: 91 mg/dL (ref 65–99)
POTASSIUM: 3.9 mmol/L (ref 3.5–5.1)
SODIUM: 141 mmol/L (ref 135–145)
TOTAL PROTEIN: 7.3 g/dL (ref 6.5–8.1)

## 2016-02-10 LAB — PROTIME-INR
INR: 0.89
PROTHROMBIN TIME: 12 s (ref 11.4–15.2)

## 2016-02-10 LAB — APTT: aPTT: 32 seconds (ref 24–36)

## 2016-02-10 MED ORDER — SODIUM CHLORIDE 0.9 % IV SOLN: INTRAVENOUS | Status: DC

## 2016-02-10 MED ORDER — MIDAZOLAM HCL 2 MG/2ML IJ SOLN
INTRAMUSCULAR | Status: AC
  Filled 2016-02-10: qty 6

## 2016-02-10 MED ORDER — SODIUM CHLORIDE 0.9% FLUSH
10.0000 mL | INTRAVENOUS | Status: AC | PRN
Start: 2016-02-10 — End: 2016-02-10
  Administered 2016-02-10: 10 mL

## 2016-02-10 MED ORDER — HEPARIN SOD (PORK) LOCK FLUSH 100 UNIT/ML IV SOLN
500.0000 [IU] | INTRAVENOUS | Status: AC | PRN
  Administered 2016-02-10: 500 [IU]
  Filled 2016-02-10: qty 5

## 2016-02-10 MED ORDER — HYDROCODONE-ACETAMINOPHEN 5-325 MG PO TABS: 1.0000 | ORAL_TABLET | ORAL | Status: DC | PRN

## 2016-02-10 MED ORDER — FENTANYL CITRATE (PF) 100 MCG/2ML IJ SOLN
INTRAMUSCULAR | Status: AC
  Filled 2016-02-10: qty 4

## 2016-02-10 MED ORDER — FENTANYL CITRATE (PF) 100 MCG/2ML IJ SOLN
INTRAMUSCULAR | Status: AC | PRN
  Administered 2016-02-10: 50 ug via INTRAVENOUS

## 2016-02-10 MED ORDER — MIDAZOLAM HCL 2 MG/2ML IJ SOLN
INTRAMUSCULAR | Status: AC | PRN
  Administered 2016-02-10 (×3): 1 mg via INTRAVENOUS

## 2016-02-10 NOTE — H&P (Signed)
Referring Physician(s): Robynn Pane S/Penland,S  Supervising Physician: Aletta Edouard  Patient Status:  [facility] OP  Chief Complaint: "I'm having a liver biopsy"   Subjective: Patient familiar to IR service from prior Port-A-Cath placement in 2012.She has a history of right breast cancer in 2012 with prior mastectomy, radiation and chemotherapy. She has been recently diagnosed with left breast cancer.She underwent left mastectomy on 12/16/15. Biopsy showed invasive cancer with metastasis to a left axillary lymph node. PET scan on 01/23/16 revealed widespread multifocal metastatic disease in the axial and appendicular skeleton ,  a solitary metastatic lesion in the liver, mild compression fracture at T8 associated with extensive tumor, bony destruction of right ninth rib. Request now received for image guided liver lesion biopsy to rule out stage IV disease.She currently denies fever, headache, chest pain, abdominal pain, nausea, vomiting or abnormal bleeding. She does have chronic dyspnea, cough as well as back pain. Past Medical History:  Diagnosis Date  . Anxiety   . Breast CA ([place]) 07/01/2010   rt breast ca  . COPD (chronic obstructive pulmonary disease) ([place])   . Family history of breast cancer    Aunt on father's side  . Hyperlipemia   . Hypertension   . MRSA (methicillin resistant staph aureus) culture positive    in breast  . Neuropathy ([place])    bilateral toes  . Nipple discharge    with pain, infection, lump  . S/P radiation therapy 01/08/11 - 02/22/11   Right Chest Wall/ 50 Gy / 25 Fractions, Right [place]/ 50 gy/25 Fractions, Right Axillary Boost/500 cGy/25 Fractions, Right Chest Wall Boost/10 Gy/5 Fractions  . Status post chemotherapy    6 cycles of carboplatin and docetaxel with trastuzumab every 21 days followed by surgery  . Tobacco abuse 06/22/2015  . Use of letrozole (Femara) 03/09/2011  . Wears dentures    Past Surgical History:  Procedure Laterality Date  .  BREAST SURGERY  2012  . DILATION AND CURETTAGE OF UTERUS    . MASTECTOMY MODIFIED RADICAL Left 12/16/2015   Procedure: MASTECTOMY MODIFIED RADICAL;  Surgeon: Aviva Signs, MD;  Location: [facility];  Service: General;  Laterality: Left;  . port a catheter insertion    . PORT-A-CATH REMOVAL Left 01/15/2013   Procedure: REMOVAL PORT-A-CATH;  Surgeon: Scherry Ran, MD;  Location: [facility] ORS;  Service: General;  Laterality: Left;  . PORTACATH PLACEMENT Right 01/16/2016   Procedure: INSERTION PORT-A-CATH;  Surgeon: Aviva Signs, MD;  Location: [facility] ORS;  Service: General;  Laterality: Right;  . TUBAL LIGATION        Allergies: Patient has no known allergies.  Medications: Prior to Admission medications   Medication Sig Start Date End Date Taking? Authorizing Provider  albuterol (PROVENTIL HFA;VENTOLIN HFA) 108 (90 Base) MCG/ACT inhaler Inhale 2 puffs into the lungs every 6 (six) hours as needed for wheezing or shortness of breath.   Yes Historical Provider, MD  Cholecalciferol (VITAMIN D) 2000 units tablet Take 2,000 Units by mouth 2 (two) times daily.   Yes Historical Provider, MD  clobetasol cream (TEMOVATE) [id number] % Apply 1 application topically as needed. rash Patient taking differently: Apply 1 application topically daily as needed (rash). rash 10/07/15  Yes Terald Sleeper, PA-C  fexofenadine (ALLEGRA) 180 MG tablet Take 180 mg by mouth daily.     Yes Historical Provider, MD  furosemide (LASIX) 40 MG tablet Take 1 tablet (40 mg total) by mouth daily. 01/18/16  Yes [name], FNP  Ginkgo Biloba 120  MG TABS Take 120 mg by mouth daily.   Yes Historical Provider, MD  letrozole (FEMARA) 2.5 MG tablet Take 1 tablet (2.5 mg total) by mouth daily. 01/09/16  Yes Thomas S Kefalas, PA-C  LORazepam (ATIVAN) 0.5 MG tablet TAKE ONE TABLET BY MOUTH EVERY 8 HOURS AS NEEDED Patient taking differently: TAKE ONE TABLET BY MOUTH EVERY 8 HOURS AS NEEDED FOR ANXIETY 11/16/15  Yes Terald Sleeper, PA-C  losartan  (COZAAR) 100 MG tablet Take 1 tablet (100 mg total) by mouth daily. 01/18/16  Yes [name], FNP  Multiple Vitamin (MULTIVITAMIN) capsule Take 1 capsule by mouth daily.     Yes Historical Provider, MD  Multiple Vitamins-Minerals (EMERGEN-C IMMUNE PO) Take 1 tablet by mouth daily.   Yes Historical Provider, MD  naproxen sodium (ANAPROX) 220 MG tablet Take 220 mg by mouth 2 (two) times daily as needed (pain). pain   Yes Historical Provider, MD  omeprazole (PRILOSEC OTC) 20 MG tablet Take 40 mg by mouth daily.   Yes Historical Provider, MD  potassium chloride SA (K-DUR,KLOR-CON) 20 MEQ tablet Take 1 tablet (20 mEq total) by mouth 2 (two) times daily. 10/07/15 10/06/16 Yes Terald Sleeper, PA-C  rosuvastatin (CRESTOR) 20 MG tablet Take 1 tablet (20 mg total) by mouth daily. 10/07/15  Yes Terald Sleeper, PA-C  ondansetron (ZOFRAN ODT) 4 MG disintegrating tablet Take 1 tablet (4 mg total) by mouth every 8 (eight) hours as needed for nausea or vomiting. 12/18/15   Aviva Signs, MD  predniSONE (DELTASONE) 5 MG tablet Label  & dispense according to the schedule below. 10 Pills PO for 3 days then, 8 Pills PO for 3 days, 6 Pills PO for 3 days, 4 Pills PO for 3 days, 2 Pills PO for 3 days, 1 Pills PO for 3 days, 1/2 Pill  PO for 3 days then STOP. Total 95 pills. 12/21/15   Thurnell Lose, MD     Vital Signs: BP (!) 165/102 (BP Location: Right Wrist)   Pulse 98   Temp 97.7 F (36.5 C) (Oral)   Resp 18   LMP 03/08/2010   SpO2 93%   Physical Exam Awake, alert. Chest with distant breath sounds bilaterally. Right chest wall Port-A-Cath intact.  Recent left mastectomy wound with small amount drainage, covered with gauze dressing; Abdomen soft, positive bowel sounds, nontender. Lower extremities with no edema.  Imaging: No results found.  Labs:  CBC:  Recent Labs  12/18/15 0714 12/18/15 2030 12/19/15 0652 12/27/15 1553 02/10/16 1116  WBC 11.7* 11.3* 11.1* 17.2* 9.0  HGB 13.1 13.0 12.5  --  12.5    HCT 43.3 41.3 39.9 41.3 39.4  PLT 192 198 203 406* 312    COAGS: No results for input(s): INR, APTT in the last 8760 hours.  BMP:  Recent Labs  12/18/15 0714 12/18/15 2030 12/19/15 0652 12/27/15 1553  NA 138 137 140 140  K 4.5 4.2 3.9 4.7  CL 98* 96* 97* 94*  CO2 34* 34* 33* 28  GLUCOSE 107* 118* 110* 132*  BUN 10 12 12 22   CALCIUM 9.2 9.3 9.1 9.3  CREATININE 0.45 0.53 0.44 0.47*  GFRNONAA >60 >60 >60 106  GFRAA >60 >60 >60 122    LIVER FUNCTION TESTS:  Recent Labs  06/22/15 0907 10/07/15 0859 12/13/15 1419  BILITOT 0.3 0.2 0.3  AST 22 26 36  ALT 20 13 21   ALKPHOS 79 105 89  PROT 6.8 7.0 7.1  ALBUMIN 3.8 4.6 4.1  Assessment and Plan:  Pt with history of right breast cancer in 2012 with prior mastectomy, radiation and chemotherapy. She has been recently diagnosed with left breast cancer.She underwent left mastectomy on 12/16/15. Biopsy showed invasive cancer with metastasis to a left axillary lymph node. PET scan on 01/23/16 revealed widespread multifocal metastatic disease in the axial and appendicular skeleton ,  a solitary metastatic lesion in the liver, mild compression fracture at T8 associated with extensive tumor, bony destruction of right ninth rib. Request now received for image guided liver lesion biopsy to rule out stage IV disease.Risks and benefits discussed with the patient including, but not limited to bleeding, infection, damage to adjacent structures or low yield requiring additional tests. All of the patient's questions were answered, patient is agreeable to proceed. Consent signed and in chart.      Electronically Signed: D. Rowe Robert 02/10/2016, 11:57 AM   I spent a total of 20 minutes at the the patient's bedside AND on the patient's hospital floor or unit, greater than 50% of which was counseling/coordinating care for image guided liver lesion biopsy

## 2016-02-10 NOTE — Discharge Instructions (Signed)
Liver Biopsy °The liver is a large organ in the upper right-hand side of your abdomen. A liver biopsy is a procedure in which a tissue sample is taken from the liver and examined under a microscope. The procedure is done to confirm a suspected problem. °There are three types of liver biopsies: °· Percutaneous. In this type, an incision is made in your abdomen. The sample is removed through the incision with a needle. °· Laparoscopic. In this type, several incisions are made in the abdomen. A tiny camera is passed through one of the incisions to help guide the health care provider. The sample is removed through the other incision or incisions. °· Transjugular. In this type, an incision is made in the neck. A tube is passed through the incision to the liver. The sample is removed through the tube with a needle. °Tell a health care provider about: °· Any allergies you have. °· All medicines you are taking, including vitamins, herbs, eye drops, creams, and over-the-counter medicines. °· Any problems you or family members have had with anesthetic medicines. °· Any blood disorders you have. °· Any surgeries you have had. °· Any medical conditions you have. °· Possibility of pregnancy, if this applies. °What are the risks? °Generally, this is a safe procedure. However, problems can occur and include: °· Bleeding. °· Infection. °· Bruising. °· Collapsed lung. °· Leak of digestive juices (bile) from the liver or gallbladder. °· Problems with heart rhythm. °· Pain at the biopsy site or in the right shoulder. °· Low blood pressure (hypotension). °· Injury to nearby organs or tissues. °What happens before the procedure? °· Your health care provider may do some blood or urine tests. These will help your health care provider learn how well your kidneys and liver are working and how well your blood clots. °· Ask your health care provider if you will be able to go home the day of the procedure. Arrange for someone to take you home  and stay with you for at least 24 hours. °· Do not eat or drink anything after midnight on the night before the procedure or as directed by your health care provider. °· Ask your health care provider about: °¨ Changing or stopping your regular medicines. This is especially important if you are taking diabetes medicines or blood thinners. °¨ Taking medicines such as aspirin and ibuprofen. These medicines can thin your blood. Do not take these medicines before your procedure if your health care provider asks you not to. °What happens during the procedure? °Regardless of the type of biopsy that will be done, you will have an IV line placed. Through this line, you will receive fluids and medicine to relax you. If you will be having a laparoscopic biopsy, you may also receive medicine through this line to make you sleep during the procedure (general anesthetic). °Percutaneous Liver Biopsy °· You will positioned on your back, with your right hand over your head. °· A health care provider will locate your liver by tapping and pressing on the right side of your abdomen or with the help of an ultrasound machine or CT scan. °· An area at the bottom of your last right rib will be numbed. °· An incision will be made in the numbed area. °· The biopsy needle will be inserted into the incision. °· Several samples of liver tissue will be taken with the biopsy needle. You will be asked to hold your breath as each sample is taken. °Laparoscopic Liver Biopsy °·   You will be positioned on your back. °· Several small incisions will be made in your abdomen. °· Your doctor will pass a tiny camera through one incision. The camera will allow the liver to be viewed on a TV monitor in the operating room. °· Tools will be passed through the other incision or incisions. These tools will be used to remove samples of liver tissue. °Transjugular Liver Biopsy °· You will be positioned on your back on an X-ray table, with your head turned to your  left. °· An area on your neck just over your jugular vein will be numbed. °· An incision will be made in the numbed area. °· A tiny tube will be inserted through the incision. It will be pushed through the jugular vein to a blood vessel in the liver called the hepatic vein. °· Dye will be inserted through the tube, and X-rays will be taken. The dye will make the blood vessels in the liver light up on the X-rays. °· The biopsy needle will be pushed through the tube until it reaches the liver. °· Samples of liver tissue will be taken with the biopsy needle. °· The needle and the tube will be removed. °After the samples are obtained, the incision or incisions will be closed. °What happens after the procedure? °· You will be taken to a recovery area. °· You may have to lie on your right side for 1-2 hours. This will prevent bleeding from the biopsy site. °· Your progress will be watched. Your blood pressure, pulse, and the biopsy site will be checked often. °· You may have some pain or feel sick. If this happens, tell your health care provider. °· As you begin to feel better, you will be offered ice and beverages. °· You may be allowed to go home when the medicines have worn off and you can walk, drink, eat, and use the bathroom. °This information is not intended to replace advice given to you by your health care provider. Make sure you discuss any questions you have with your health care provider. °Document Released: 04/14/2003 Document Revised: 06/27/2015 Document Reviewed: 03/20/2013 °Elsevier Interactive Patient Education © 2017 Elsevier Inc[employer] °Liver Biopsy, Care After °Introduction °Refer to this sheet in the next few weeks. These instructions provide you with information on caring for yourself after your procedure. Your health care provider may also give you more specific instructions. Your treatment has been planned according to current medical practices, but problems sometimes occur. Call your health care  provider if you have any problems or questions after your procedure. °What can I expect after the procedure? °After your procedure, it is typical to have the following: °· A small amount of discomfort in the area where the biopsy was done and in the right shoulder or shoulder blade. °· A small amount of bruising around the area where the biopsy was done and on the skin over the liver. °· Sleepiness and fatigue for the rest of the day. °Follow these instructions at home: °· Rest at home for 1-2 days or as directed by your health care provider. °· Have a friend or family member stay with you for at least 24 hours. °· Because of the medicines used during the procedure, you should not do the following things in the first 24 hours: °¨ Drive. °¨ Use machinery. °¨ Be responsible for the care of other people. °¨ Sign legal documents. °¨ Take a bath or shower. °· There are many different ways to close and   cover an incision, including stitches, skin glue, and adhesive strips. Follow your health care provider's instructions on: °¨ Incision care. °¨ Bandage (dressing) changes and removal. °¨ Incision closure removal. °· Do not drink alcohol in the first week. °· Do not lift more than 5 pounds or play contact sports for 2 weeks after this test. °· Take medicines only as directed by your health care provider. Do not take medicine containing aspirin or non-steroidal anti-inflammatory medicines such as ibuprofen for 1 week after this test. °· It is your responsibility to get your test results. °Contact a health care provider if: °· You have increased bleeding from an incision that results in more than a small spot of blood. °· You have redness, swelling, or increasing pain in any incisions. °· You notice a discharge or a bad smell coming from any of your incisions. °· You have a fever or chills. °Get help right away if: °· You develop swelling, bloating, or pain in your abdomen. °· You become dizzy or faint. °· You develop a  rash. °· You are nauseous or vomit. °· You have difficulty breathing, feel short of breath, or feel faint. °· You develop chest pain. °· You have problems with your speech or vision. °· You have trouble balancing or moving your arms or legs. °This information is not intended to replace advice given to you by your health care provider. Make sure you discuss any questions you have with your health care provider. °Document Released: 08/11/2004 Document Revised: 06/30/2015 Document Reviewed: 03/20/2013 °© 2017 Elsevier °Moderate Conscious Sedation, Adult, Care After °These instructions provide you with information about caring for yourself after your procedure. Your health care provider may also give you more specific instructions. Your treatment has been planned according to current medical practices, but problems sometimes occur. Call your health care provider if you have any problems or questions after your procedure. °What can I expect after the procedure? °After your procedure, it is common: °· To feel sleepy for several hours. °· To feel clumsy and have poor balance for several hours. °· To have poor judgment for several hours. °· To vomit if you eat too soon. °Follow these instructions at home: °For at least 24 hours after the procedure:  °· Do not: °¨ Participate in activities where you could fall or become injured. °¨ Drive. °¨ Use heavy machinery. °¨ Drink alcohol. °¨ Take sleeping pills or medicines that cause drowsiness. °¨ Make important decisions or sign legal documents. °¨ Take care of children on your own. °· Rest. °Eating and drinking °· Follow the diet recommended by your health care provider. °· If you vomit: °¨ Drink water, juice, or soup when you can drink without vomiting. °¨ Make sure you have little or no nausea before eating solid foods. °General instructions °· Have a responsible adult stay with you until you are awake and alert. °· Take over-the-counter and prescription medicines only as told  by your health care provider. °· If you smoke, do not smoke without supervision. °· Keep all follow-up visits as told by your health care provider. This is important. °Contact a health care provider if: °· You keep feeling nauseous or you keep vomiting. °· You feel light-headed. °· You develop a rash. °· You have a fever. °Get help right away if: °· You have trouble breathing. °This information is not intended to replace advice given to you by your health care provider. Make sure you discuss any questions you have with your health care provider. °  Document Released: 11/12/2012 Document Revised: 06/27/2015 Document Reviewed: 05/14/2015 °Elsevier Interactive Patient Education © 2017 Elsevier Inc. ° °

## 2016-02-10 NOTE — Procedures (Signed)
Interventional Radiology Procedure Note  Procedure:  US guided biopsy of liver mass  Complications: None  Estimated Blood Loss: < 10 mL  2 cm lesion in right lobe of liver well visualized.  18 G core biopsy x 3 via 17 G needle.  Sheila Garrison. Kathlene Cote, M.D Pager:  [phone]

## 2016-02-15 ENCOUNTER — Encounter (HOSPITAL_COMMUNITY): Payer: PRIVATE HEALTH INSURANCE | Attending: Hematology & Oncology | Admitting: Oncology

## 2016-02-15 ENCOUNTER — Other Ambulatory Visit: Payer: Self-pay

## 2016-02-15 ENCOUNTER — Encounter (HOSPITAL_COMMUNITY): Payer: Self-pay | Admitting: Oncology

## 2016-02-15 ENCOUNTER — Ambulatory Visit (HOSPITAL_COMMUNITY): Payer: PRIVATE HEALTH INSURANCE

## 2016-02-15 VITALS — BP 146/87 | HR 113 | Temp 97.9°F | Resp 16 | Wt 150.0 lb

## 2016-02-15 DIAGNOSIS — Z95828 Presence of other vascular implants and grafts: Secondary | ICD-10-CM

## 2016-02-15 DIAGNOSIS — C50919 Malignant neoplasm of unspecified site of unspecified female breast: Secondary | ICD-10-CM | POA: Diagnosis not present

## 2016-02-15 DIAGNOSIS — C50912 Malignant neoplasm of unspecified site of left female breast: Secondary | ICD-10-CM | POA: Diagnosis not present

## 2016-02-15 DIAGNOSIS — C50911 Malignant neoplasm of unspecified site of right female breast: Secondary | ICD-10-CM

## 2016-02-15 DIAGNOSIS — C773 Secondary and unspecified malignant neoplasm of axilla and upper limb lymph nodes: Secondary | ICD-10-CM

## 2016-02-15 DIAGNOSIS — L089 Local infection of the skin and subcutaneous tissue, unspecified: Secondary | ICD-10-CM

## 2016-02-15 DIAGNOSIS — C7951 Secondary malignant neoplasm of bone: Secondary | ICD-10-CM | POA: Diagnosis not present

## 2016-02-15 HISTORY — DX: Secondary malignant neoplasm of bone: C79.51

## 2016-02-15 LAB — CBC WITH DIFFERENTIAL/PLATELET
BASOS PCT: 0 %
Basophils Absolute: 0 10*3/uL (ref 0.0–0.1)
Eosinophils Absolute: 0.2 10*3/uL (ref 0.0–0.7)
Eosinophils Relative: 2 %
HEMATOCRIT: 41.8 % (ref 36.0–46.0)
Hemoglobin: 13.7 g/dL (ref 12.0–15.0)
Lymphocytes Relative: 13 %
Lymphs Abs: 1.3 10*3/uL (ref 0.7–4.0)
MCH: 30.6 pg (ref 26.0–34.0)
MCHC: 32.8 g/dL (ref 30.0–36.0)
MCV: 93.5 fL (ref 78.0–100.0)
MONO ABS: 1.2 10*3/uL — AB (ref 0.1–1.0)
MONOS PCT: 11 %
NEUTROS ABS: 7.8 10*3/uL — AB (ref 1.7–7.7)
Neutrophils Relative %: 74 %
Platelets: 335 10*3/uL (ref 150–400)
RBC: 4.47 MIL/uL (ref 3.87–5.11)
RDW: 15.2 % (ref 11.5–15.5)
WBC: 10.5 10*3/uL (ref 4.0–10.5)

## 2016-02-15 LAB — COMPREHENSIVE METABOLIC PANEL
ALT: 24 U/L (ref 14–54)
AST: 53 U/L — AB (ref 15–41)
Albumin: 3.8 g/dL (ref 3.5–5.0)
Alkaline Phosphatase: 120 U/L (ref 38–126)
Anion gap: 10 (ref 5–15)
BUN: 16 mg/dL (ref 6–20)
CHLORIDE: 100 mmol/L — AB (ref 101–111)
CO2: 30 mmol/L (ref 22–32)
CREATININE: 0.63 mg/dL (ref 0.44–1.00)
Calcium: 9.4 mg/dL (ref 8.9–10.3)
GFR calc Af Amer: >60 mL/min (ref >60)
Glucose, Bld: 98 mg/dL (ref 65–99)
Potassium: 3.7 mmol/L (ref 3.5–5.1)
SODIUM: 140 mmol/L (ref 135–145)
Total Bilirubin: 0.2 mg/dL — ABNORMAL LOW (ref 0.3–1.2)
Total Protein: 7.2 g/dL (ref 6.5–8.1)

## 2016-02-15 LAB — MAGNESIUM: Magnesium: 1.9 mg/dL (ref 1.7–2.4)

## 2016-02-15 LAB — PHOSPHORUS: Phosphorus: 4.1 mg/dL (ref 2.5–4.6)

## 2016-02-15 MED ORDER — SODIUM CHLORIDE 0.9% FLUSH
10.0000 mL | INTRAVENOUS | Status: DC | PRN
  Administered 2016-02-15: 10 mL via INTRAVENOUS
  Filled 2016-02-15: qty 10

## 2016-02-15 MED ORDER — RIBOCICLIB SUCCINATE 200 MG PO TABS: 600.0000 mg | ORAL_TABLET | Freq: Every day | ORAL | 0 refills | Status: DC

## 2016-02-15 MED ORDER — DOXYCYCLINE HYCLATE 100 MG PO TABS: 100.0000 mg | ORAL_TABLET | Freq: Two times a day (BID) | ORAL | 0 refills | Status: AC

## 2016-02-15 MED ORDER — HEPARIN SOD (PORK) LOCK FLUSH 100 UNIT/ML IV SOLN
500.0000 [IU] | Freq: Once | INTRAVENOUS | Status: AC
  Administered 2016-02-15: 500 [IU] via INTRAVENOUS

## 2016-02-15 MED ORDER — HEPARIN SOD (PORK) LOCK FLUSH 100 UNIT/ML IV SOLN
INTRAVENOUS | Status: AC
  Filled 2016-02-15: qty 5

## 2016-02-15 NOTE — Progress Notes (Signed)
Sheila Garrison presented for Portacath access, flush and labs.  Portacath located right chest wall accessed with  H 20 needle.  Good blood return present. Specimen drawn and sent down to lab to be resulted. Portacath flushed with 43ml NS and 500U/57ml Heparin and needle removed intact.  Procedure tolerated well and without incident.  Pt stable and discharged home ambulatory with family.

## 2016-02-15 NOTE — Patient Instructions (Addendum)
[place] at [facility] Discharge Instructions  RECOMMENDATIONS MADE BY THE CONSULTANT AND ANY TEST RESULTS WILL BE SENT TO YOUR REFERRING PHYSICIAN.  You saw Kirby Crigler, PA-C, today. Antibiotic sent to pharmacy. EKG today. Chemo teaching today. Follow up in 2 weeks with Tom with EKG and labs Follow up in 4 weeks with Dr. Whitney Muse with EKG and labs. See Amy at checkout for appointments.  Thank you for choosing [place] at [facility] to provide your oncology and hematology care.  To afford each patient quality time with our provider, please arrive at least 15 minutes before your scheduled appointment time.    If you have a lab appointment with the [facility] please come in thru the  Main Entrance and check in at the main information desk  You need to re-schedule your appointment should you arrive 10 or more minutes late.  We strive to give you quality time with our providers, and arriving late affects you and other patients whose appointments are after yours.  Also, if you no show three or more times for appointments you may be dismissed from the clinic at the providers discretion.     Again, thank you for choosing [facility].  Our hope is that these requests will decrease the amount of time that you wait before being seen by our physicians.       _____________________________________________________________  Should you have questions after your visit to [facility], please contact our office at [phone] between the hours of 8:30 a.m. and 4:30 p.m.  Voicemails left after 4:30 p.m. will not be returned until the following business day.  For prescription refill requests, have your pharmacy contact our office.       Resources For Cancer Patients and their Caregivers ? American Cancer Society: Can assist with transportation, wigs, general needs, runs Look Good Feel Better.        (825) 120-2177 ? Cancer  Care: Provides financial assistance, online support groups, medication/co-pay assistance.  1-800-813-HOPE 504-504-6563) ? [facility] Assists [place] cancer patients and their families through emotional , educational and financial support.  478-368-7669 ? [place] DSS Where to apply for food stamps, Medicaid and utility assistance. 863-453-0379 ? [facility]: Transportation to medical appointments. (240)148-4553 ? Social Security Administration: May apply for disability if have a Stage IV cancer. (518)519-8450 956-353-2842 ? [employer], Disability and Transit Services: Assists with nutrition, care and transit needs. [place] Support Programs: @10RELATIVEDAYS @ > Cancer Support Group  2nd Tuesday of the month 1pm-2pm, Journey Room  > Creative Journey  3rd Tuesday of the month 1130am-1pm, Journey Room  > Look Good Feel Better  1st Wednesday of the month 10am-12 noon, Journey Room (Call [place] to register [phone])

## 2016-02-15 NOTE — Patient Instructions (Signed)
[place]   CHEMOTHERAPY INSTRUCTIONS  You have been diagnosed with Stage 4 breast cancer.  We are going to start you on Kisqali with femara.  You will take this until you can no longer tolerate treatment or you have progressed.  This treatment is with palliative intent, which means you are treatable but not curable.   You will see the doctor regularly throughout treatment.  We monitor your lab work prior to every treatment.  The doctor monitors your response to treatment by the way you are feeling, your blood work, and scans periodically.  POTENTIAL SIDE EFFECTS OF TREATMENT:  The recommended starting dose of KISQALI is 600 mg (three 200-mg tablets), taken once daily  . Ask your doctor about the recommended dose of your hormone therapy  For the first 3 weeks in a cycle, you'll take your 3 KISQALI pills in combination with a hormone therapy. The fourth week, you won't take any KISQALI pills-you'll only take your hormone therapy. After the fourth week, your doctor may have you start the same cycle over again, or may adjust your dose if needed.   Dosing Instructions  Take the recommended dose of KISQALI (ribociclib) once each day at about the same time, preferably in the morning  You can take [facility] either with or without food  Swallow KISQALI tablets whole with a glass of water. Do not chew, crush, or split the tablets  Do not take any KISQALI tablets that are broken, cracked, or that look damaged  Avoid pomegranate or pomegranate juice, and grapefruit or grapefruit juice while taking KISQALI  If you miss a dose of KISQALI or vomit after taking a dose of KISQALI, do not take another dose on that day. Take your next dose at your regular time  If you take too much [facility], call your doctor right away or go to the nearest hospital emergency room   Understanding Side Effects It's only natural to have concerns about side effects when starting a new treatment.  KISQALI may cause side effects that you are not familiar with. Your health care provider may tell you to decrease your dose, temporarily stop, or completely stop taking KISQALI if you develop certain serious side effects during treatment with KISQALI. Contact your doctor if you experience any of the following side effects: Serious Side Effects  Heart rhythm problems (QT prolongation). KISQALI (ribociclib) can cause a heart problem known as QT prolongation. This condition can cause an abnormal heartbeat and may lead to death. Tell your health care provider right away if you have a change in your heartbeat, or if you feel dizzy or faint. Liver problems (hepatobiliary toxicity). KISQALI (ribociclib) can cause serious liver problems. Tell your health care provider right away if you experience: . yellowing of your skin or the whites of your eyes (jaundice) . dark or brown (tea-colored) urine . feeling very tired . loss of appetite . pain on the right side of your stomach area (abdomen) . bleeding or bruising more easily than normal Low white blood cell counts (neutropenia). KISQALI (ribociclib) can cause low white blood cell counts that can result in severe infections. Tell your health care provider right away if you have signs and symptoms of low white blood cell counts or infections such as fever and chills. Common Side Effects The most common side effects of KISQALI include: nausea  vomiting  tiredness  constipation  diarrhea  headache  hair loss  back pain    SELF CARE  ACTIVITIES WHILE ON CHEMOTHERAPY: Hydration Increase your fluid intake 48 hours prior to treatment and drink at least 8 to 12 cups (64 ounces) of water/decaff beverages per day after treatment. You can still have your cup of coffee or soda but these beverages do not count as part of your 8 to 12 cups that you need to drink daily. No alcohol intake.  Medications Continue taking your normal prescription medication as  prescribed.  If you start any new herbal or new supplements please let us know first to make sure it is safe.  Mouth Care Have teeth cleaned professionally before starting treatment. Keep dentures and partial plates clean. Use soft toothbrush and do not use mouthwashes that contain alcohol. Biotene is a good mouthwash that is available at most pharmacies or may be ordered by calling [phone]. Use warm salt water gargles (1 teaspoon salt per 1 quart warm water) before and after meals and at bedtime. Or you may rinse with 2 tablespoons of three-percent hydrogen peroxide mixed in eight ounces of water. If you are still having problems with your mouth or sores in your mouth please call the clinic. If you need dental work, please let Dr. Whitney Muse know before you go for your appointment so that we can coordinate the best possible time for you in regards to your chemo regimen. You need to also let your dentist know that you are actively taking chemo. We may need to do labs prior to your dental appointment.   Skin Care Always use sunscreen that has not expired and with SPF (Sun Protection Factor) of 50 or higher. Wear hats to protect your head from the sun. Remember to use sunscreen on your hands, ears, face, & feet.  Use good moisturizing lotions such as udder cream, eucerin, or even Vaseline. Some chemotherapies can cause dry skin, color changes in your skin and nails.    . Avoid long, hot showers or baths. . Use gentle, fragrance-free soaps and laundry detergent. . Use moisturizers, preferably creams or ointments rather than lotions because the thicker consistency is better at preventing skin dehydration. Apply the cream or ointment within 15 minutes of showering. Reapply moisturizer at night, and moisturize your hands every time after you wash them.  Hair Loss (if your doctor says your hair will fall out)  . If your doctor says that your hair is likely to fall out, decide before you begin chemo  whether you want to wear a wig. You may want to shop before treatment to match your hair color. . Hats, turbans, and scarves can also camouflage hair loss, although some people prefer to leave their heads uncovered. If you go bare-headed outdoors, be sure to use sunscreen on your scalp. . Cut your hair short. It eases the inconvenience of shedding lots of hair, but it also can reduce the emotional impact of watching your hair fall out. . Don't perm or color your hair during chemotherapy. Those chemical treatments are already damaging to hair and can enhance hair loss. Once your chemo treatments are done and your hair has grown back, it's OK to resume dyeing or perming hair. With chemotherapy, hair loss is almost always temporary. But when it grows back, it may be a different color or texture. In older adults who still had hair color before chemotherapy, the new growth may be completely gray.  Often, new hair is very fine and soft.  Infection Prevention Please wash your hands for at least 30 seconds using warm soapy water.  Handwashing is the #1 way to prevent the spread of germs. Stay away from sick people or people who are getting over a cold. If you develop respiratory systems such as green/yellow mucus production or productive cough or persistent cough let us know and we will see if you need an antibiotic. It is a good idea to keep a pair of gloves on when going into grocery stores/[employer] to decrease your risk of coming into contact with germs on the carts, etc. Carry alcohol hand gel with you at all times and use it frequently if out in public. If your temperature reaches 100.5 or higher please call the clinic and let us know.  If it is after hours or on the weekend please go to the ER if your temperature is over 100.5.  Please have your own personal thermometer at home to use.    Sex and bodily fluids If you are going to have sex, a condom must be used to protect the person that isn't taking  chemotherapy. Chemo can decrease your libido (sex drive). For a few days after chemotherapy, chemotherapy can be excreted through your bodily fluids.  When using the toilet please close the lid and flush the toilet twice.  Do this for a few day after you have had chemotherapy.     Effects of chemotherapy on your sex life Some changes are simple and won't last long. They won't affect your sex life permanently. Sometimes you may feel: . too tired . not strong enough to be very active . sick or sore  . not in the mood . anxious or low Your anxiety might not seem related to sex. For example, you may be worried about the cancer and how your treatment is going. Or you may be worried about money, or about how you family are coping with your illness. These things can cause stress, which can affect your interest in sex. It's important to talk to your partner about how you feel. Remember - the changes to your sex life don't usually last long. There's usually no medical reason to stop having sex during chemo. The drugs won't have any long term physical effects on your performance or enjoyment of sex. Cancer can't be passed on to your partner during sex  Contraception It's important to use reliable contraception during treatment. Avoid getting pregnant while you or your partner are having chemotherapy. This is because the drugs may harm the baby. Sometimes chemotherapy drugs can leave a man or woman infertile.  This means you would not be able to have children in the future. You might want to talk to someone about permanent infertility. It can be very difficult to learn that you may no longer be able to have children. Some people find counselling helpful. There might be ways to preserve your fertility, although this is easier for men than for women. You may want to speak to a fertility expert. You can talk about sperm banking or harvesting your eggs. You can also ask about other fertility options, such as  donor eggs. If you have or have had breast cancer, your doctor might advise you not to take the contraceptive pill. This is because the hormones in it might affect the cancer.  It is not known for sure whether or not chemotherapy drugs can be passed on through semen or secretions from the vagina. Because of this some doctors advise people to use a barrier method if you have sex during treatment. This applies to vaginal, anal  or oral sex. Generally, doctors advise a barrier method only for the time you are actually having the treatment and for about a week after your treatment. Advice like this can be worrying, but this does not mean that you have to avoid being intimate with your partner. You can still have close contact with your partner and continue to enjoy sex.  Animals If you have cats or birds we just ask that you not change the litter or change the cage.  Please have someone else do this for you while you are on chemotherapy.   Food Safety During and After Cancer Treatment Food safety is important for people both during and after cancer treatment. Cancer and cancer treatments, such as chemotherapy, radiation therapy, and stem cell/bone marrow transplantation, often weaken the immune system. This makes it harder for your body to protect itself from foodborne illness, also called food poisoning. Foodborne illness is caused by eating food that contains harmful bacteria, parasites, or viruses.  Foods to avoid Some foods have a higher risk of becoming tainted with bacteria. These include: [place] [employer] Unwashed fresh fruit and vegetables, especially leafy vegetables that can hide dirt and other contaminants . Raw sprouts, such as alfalfa sprouts . Raw or undercooked beef, especially ground beef, or other raw or undercooked meat and poultry . Fatty, fried, or spicy foods immediately before or after treatment.  These can sit heavy on your stomach and make you feel nauseous. . Raw or undercooked shellfish, such  as oysters. . Sushi and sashimi, which often contain raw fish.  . Unpasteurized beverages, such as unpasteurized fruit juices, raw milk, raw yogurt, or cider . Undercooked eggs, such as soft boiled, over easy, and poached; raw, unpasteurized eggs; or foods made with raw egg, such as homemade raw cookie dough and homemade mayonnaise Simple steps for food safety Shop smart. . Do not buy food stored or displayed in an unclean area. . Do not buy bruised or damaged fruits or vegetables. . Do not buy cans that have cracks, dents, or bulges. . Pick up foods that can spoil at the end of your shopping trip and store them in a cooler on the way home. Prepare and clean up foods carefully. . Rinse all fresh fruits and vegetables under running water, and dry them with a clean towel or paper towel. . Clean the top of cans before opening them. . After preparing food, wash your hands for 20 seconds with hot water and soap. Pay special attention to areas between fingers and under nails. . Clean your utensils and dishes with hot water and soap. [place] [employer] Disinfect your kitchen and cutting boards using 1 teaspoon of liquid, unscented bleach mixed into 1 quart of water.   Dispose of old food. . Eat canned and packaged food before its expiration date (the "use by" or "best before" date). . Consume refrigerated leftovers within 3 to 4 days. After that time, throw out the food. Even if the food does not smell or look spoiled, it still may be unsafe. Some bacteria, such as Listeria, can grow even on foods stored in the refrigerator if they are kept for too long. Take precautions when eating out. . At restaurants, avoid buffets and salad bars where food sits out for a long time and comes in contact with many people. Food can become contaminated when someone with a virus, often a norovirus, or another "bug" handles it. . Put any leftover food in a "to-go" container yourself, rather than having the server  do it. And, refrigerate  leftovers as soon as you get home. . Choose restaurants that are clean and that are willing to prepare your food as you order it cooked.    MEDICATIONS: Over-the-Counter Meds:  Miralax 1 capful in 8 oz of fluid daily. May increase to two times a day if needed. This is a stool softener. If this doesn't work proceed you can add:  Senokot S-start with 1 tablet two times a day and increase to 4 tablets two times a day if needed. (total of 8 tablets in a 24 hour period). This is a stimulant laxative.   Call us if this does not help your bowels move.   Imodium 2mg  capsule. Take 2 capsules after the 1st loose stool and then 1 capsule every 2 hours until you go a total of 12 hours without having a loose stool. Call the [facility] if loose stools continue. If diarrhea occurs @ bedtime, take 2 capsules @ bedtime. Then take 2 capsules every 4 hours until morning. Call [facility].     Constipation Sheet *Miralax in 8 oz of fluid daily.  May increase to two times a day if needed.  This is a stool softener.  If this not enough to keep your bowel regular:  You can add:  *Senokot S, start with one tablet twice a day and can increase to 4 tablets twice a day if needed.  This is a stimulant laxative.   Sometimes when you take pain medication you need BOTH a medicine to keep your stool soft and a medicine to help your bowel push it out!  Please call if the above does not work for you.   Do not go more than 2 days without a bowel movement.  It is very important that you do not become constipated.  It will make you feel sick to your stomach (nausea) and can cause abdominal pain and vomiting.     Diarrhea Sheet  If you are having loose stools/diarrhea, please purchase Imodium and begin taking as outlined:  At the first sign of poorly formed or loose stools you should begin taking Imodium(loperamide) 2 mg capsules.  Take two caplets (4mg ) followed by one caplet (2mg ) every 2 hours until you have  had no diarrhea for 12 hours.  During the night take two caplets (4mg ) at bedtime and continue every 4 hours during the night until the morning.  Stop taking Imodium only after there is no sign of diarrhea for 12 hours.    Always call the [facility] if you are having loose stools/diarrhea that you can't get under control.  Loose stools/disrrhea leads to dehydration (loss of water) in your body.  We have other options of trying to get the loose stools/diarrhea to stopped but you must let us know!     Nausea Sheet  Zofran/Ondansetron 8mg  tablet. Take 1 tablet every 8 hours as needed for nausea/vomiting. (#1 nausea med to take, this can constipate)  Compazine/Prochlorperazine 10mg  tablet. Take 1 tablet every 6 hours as needed for nausea/vomiting. (#2 nausea med to take, this can make you sleepy)  You can take these medications together or separately.  We would first like for you to try the Ondansetron by itself and then take the Prochloperizine if needed. But you are allowed to take both medications at the same time if your nausea is that severe.  If you are having persistent nausea (nausea that does not stop) please take these medications on a staggered schedule  so that the nausea medication stays in your body.  Please call the [facility] and let us know the amount of nausea that you are experiencing.  If you begin to vomit, you need to call the [facility] and if it is the weekend and you have vomited more than one time and cant get it to stop-go to the Emergency Room.  Persistent nausea/vomiting can lead to dehydration (loss of fluid in your body) and will make you feel terrible.   Ice chips, sips of clear liquids, foods that are @ room temperature, crackers, and toast tend to be better tolerated.    SYMPTOMS TO REPORT AS SOON AS POSSIBLE AFTER TREATMENT:  FEVER GREATER THAN 100.5 F  CHILLS WITH OR WITHOUT FEVER  NAUSEA AND VOMITING THAT IS NOT CONTROLLED WITH YOUR NAUSEA  MEDICATION  UNUSUAL SHORTNESS OF BREATH  UNUSUAL BRUISING OR BLEEDING  TENDERNESS IN MOUTH AND THROAT WITH OR WITHOUT PRESENCE OF ULCERS  URINARY PROBLEMS  BOWEL PROBLEMS  UNUSUAL RASH    Wear comfortable clothing and clothing appropriate for easy access to any Portacath or PICC line. Let us know if there is anything that we can do to make your therapy better!    What to do if you need assistance after hours or on the weekends: CALL [phone].  HOLD on the line, do not hang up.  You will hear multiple messages but at the end you will be connected with a nurse triage line.  They will contact Dr Whitney Muse if necessary.  Most of the time they will be able to assist you.   Do not call the hospital operator.  Dr Whitney Muse will not answer phone calls received by them.     I have been informed and understand all of the instructions given to me and have received a copy. I have been instructed to call the clinic [phone] or my family physician as soon as possible for continued medical care, if indicated. I do not have any more questions at this time but understand that I may call the [facility] or the Patient Navigator at [phone] during office hours should I have questions or need assistance in obtaining follow-up care.

## 2016-02-15 NOTE — Progress Notes (Signed)
Consent signed for kisqali

## 2016-02-15 NOTE — Assessment & Plan Note (Addendum)
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  ER/PR+ 2%, HER2 NEGATIVE.  Complicated history with right breast cancer, Stage IIIC, ER/PR+ and HER 2 POSITIVE in 2012 managed with right mastectomy, systemic chemotherapy, HER2 targeted therapy, XRT, and anti-endocrine therapy with left sided breast cancer in 2017 with mastectomy in Nov 2017 by Dr. Arnoldo Morale demonstrating invasive lobular carcinoma, ER/PR+ (2%) and HER2 NEGATIVE.  Biopsy of liver lesion in Jan 2018 demonstrated DUCTAL carcinoma (breast prognostic profile pending at this time) which may be a recurrence of her first cancer in 2012 instead of a new cancer.  I personally reviewed and went over radiographic studies with the patient.  The results are noted within this dictation.  MRI T-spine results reviewed.  Osseous lesions noted.  I personally reviewed and went over pathology results with the patient.  Will start Letrozole + Kisqali at this time.  She has been on Letrozole since 2013 for her first cancer.  I reviewed the risks, benefits, alternatives, and side effects of Kisqali including, but not limited to, fatigue, headaches, insomnia, alopecia, skin rash, pruritis, nausea, vomiting, diarrhea, constipation, decreased appetite, stomatitis, decreased blood counts, increase risk for infection, change in liver function results, back pain, worsening renal function.  Patient provided with a free combo-kit for Letrozole + Kisqali.  She will have chemotherapy teaching by Anderson Malta, nurse navigator, today.  Rx for doxycycline 100 mg BID x 7 days is escribed for her left breast area and left axillary nodules.  If not improved, will get her back to Dr. Arnoldo Morale for evaluation/management.  Baseline labs today: CBC diff, CMET, magnesium, phosphorus.  I personally reviewed and went over laboratory results with the patient.  The results  are noted within this dictation.  Labs every 2 weeks x first 2 cycles.  Baseline EKG today.  EKG on day 15 cycle 1 and day 1 cycle 2.  Based upon breast prognostic profile, we may need to consider a change in therapy.  Will need to start bone targeted therapy for bone metastases in the future.  Xgeva supportive therapy plan is built.  Once approved, we will institute this therapy after reviewing the risks, benefits, alternatives, and side effects.  For T8 compression fracture will refer patient to XRT therapy for palliative treatment.  Return in 2 weeks for follow-up.

## 2016-02-15 NOTE — Progress Notes (Signed)
[name], FNP [place] [address]  [name], stage 4, left ([place])  Invasive lobular carcinoma of breast, stage 4, left ([place]) - Plan: CBC with Differential, Comprehensive metabolic panel, Magnesium, Phosphorus, EKG 12-Lead, CBC with Differential, Comprehensive metabolic panel, Magnesium, Phosphorus  Skin infection - Plan: doxycycline (VIBRA-TABS) 100 MG tablet  Port catheter in place - Plan: heparin lock flush 100 unit/mL, sodium chloride flush (NS) 0.9 % injection 10 mL  Bone metastases (HCC)  CURRENT THERAPY: Starting Letrozole + Kisqali  INTERVAL HISTORY: Sheila Garrison 61 y.o. female returns for followup of Stage IV (pT3pN2ApM1) invasive ductal carcinoma of left/right breast with biopsy proven disease to liver and radiographic findings suspicious for osseous involvement.  ER/PR+ 2%, HER2 NEGATIVE AND Invasive lobular left breast cancer, Stage IIB (pT3pN2A), S/P left mastectomy by Dr. Arnoldo Morale on 12/16/2015, ER/PR+ 2%.  HER2 NEGATIVE. 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 Margot Chimes, systemic chemotherapy by Dr. Jana Hakim in 2012 and Herceptin x 52 weeks finishing on 07/10/2011, XRT (01/04/2011- 02/22/2011), and anti-endocrine therapy.    Stage III (T4N3) R Br cancer    06/21/2010 Initial Diagnosis    Stage III (T4N3) R Br cancer       01/04/2011 - 02/22/2011 Radiation Therapy    50 Gy       05/07/2014 Pathology Results    BCI testing- low risk of late recurrence (3.4% between years 5-10), high likelihood of benefit from extended endocrein therapy, and a 67% relative risk reduction when treated with extended endocrine therapy (27% versus 10.5%).       [name], stage 4, left ([place])   11/22/2015 Mammogram    Ultrasound-guided biopsy of the irregular hypoechoic area, with prominent shadowing, in the OUTER left  breast at the 1 o'clock axis. 2. Ultrasound-guided biopsy of the irregular mass within the INNER left breast at the 9 o'clock axis. 3. Ultrasound-guided biopsy of 1 of the enlarged and amorphous lymph nodes in the left axilla.      11/22/2015 Imaging    Ultrasound-guided biopsy of the irregular hypoechoic area, with prominent shadowing, in the OUTER left breast at the 1 o'clock axis. 2. Ultrasound-guided biopsy of the irregular mass within the INNER left breast at the 9 o'clock axis. 3. Ultrasound-guided biopsy of 1 of the enlarged and amorphous lymph nodes in the left axilla.      12/16/2015 Surgery    L modified radical mastectomy with Dr. Arnoldo Morale      12/18/2015 - 12/21/2015 Hospital Admission    Admit date: 12/18/2015 Admission diagnosis: altered MS, acute respiratory failure Additional comments: This likely was a combination of COPD exacerbation along with mild acute on chronic diastolic CHF with  echo shows a preserved EF of around 60%, he was treated with IV steroids and Lasix, she is much better but qualifies for home oxygen which will be provided      12/19/2015 Imaging    No evidence of pulmonary embolus. 2. Small right pleural effusion, with associated atelectasis. Peripheral scarring at the anterior right upper lobe. Mild bilateral emphysema noted. 3. Scattered coronary artery calcifications seen. 4. Postoperative change at the left chest wall, reflecting recent mastectomy, with vague collections of postoperative fluid seen. 5. Vague sclerotic change and heterogeneity at vertebral body T8 raises concern for sequelae of metastatic disease      01/23/2016 PET scan  1. Widespread multifocal metastatic disease in the visualized axial and appendicular skeleton. 2. Solitary metastatic lesion in the liver. Hypermetabolic irregular left axillary lymph node adjacent to the left axillary clips. 3. Activity in the left breast related to mastectomy. 4. Low-grade activity  along the pleuroparenchymal thickening anteriorly in the right chest is probably related to prior therapy rather than current malignancy. 5. Probable mild compression fracture at T8 associated with the extensive tumor at this level. There is also bony destruction of the right ninth rib posteriorly. 6. Faintly hypermetabolic but small left external iliac lymph nodes merit surveillance  ADDENDUM: The original report was by Dr. Van Clines. The following addendum is by Dr. Van Clines:  I have one other musculoskeletal focus of hypermetabolic activity to mention. In the right latissimus dorsi muscle, there is a small hypermetabolic focus with maximum SUV of 5.4, measuring 7 mm in diameter on image 89/4, compatible with a metastatic deposit.        02/02/2016 Imaging    MRI T-spine: 1. Extensive metastatic disease throughout the cervical and thoracic spine with a subtle pathologic compression fracture of T8. Tumor extends through the posterior margin of the T8 vertebra into the spinal canal without spinal cord compression at this time. 2. No other pathologic fractures of the thoracic spine at this time.      02/10/2016 Procedure    US biopsy of liver lesion      02/15/2016 Pathology Results    Liver, needle/core biopsy, right lobe METASTATIC CARCINOMA, CONSISTENT WITH BREAST DUCTAL CARCINOMA.      02/15/2016 -  Chemotherapy    Letrozole 2.5 mg daily + Kisqali (600 mg) days 1-21 every 28 days         She is here to review recent studies and medical oncology recommendations.  She reports a left chest wall drainage from her mastectomy site.  She notes that it has periodically been draining serosanguinous fluid.  She denies any fevers or chills.  She denies any tenderness at the site.  She reports that on Saturday, it started to drain white/yellow material.  She is covering the area with gauge for ongoing drainage.    She also not some left axillary nodules which  she causes a rash.  She denies any tenderness or pruritis associated with this.  Review of Systems  Constitutional: Negative.  Negative for chills, fever and weight loss.  HENT: Negative.   Eyes: Negative.  Negative for blurred vision and double vision.  Respiratory: Negative.  Negative for cough.   Cardiovascular: Negative.  Negative for chest pain.  Gastrointestinal: Negative.  Negative for abdominal pain, constipation, diarrhea, nausea and vomiting.  Genitourinary: Negative.   Musculoskeletal: Positive for back pain.  Skin: Negative.   Neurological: Negative.  Negative for weakness.  Endo/Heme/Allergies: Negative.   Psychiatric/Behavioral: Negative.     Past Medical History:  Diagnosis Date  . Anxiety   . Bone metastases ([place]) 02/15/2016  . Breast CA ([place]) 07/01/2010   rt breast ca  . COPD (chronic obstructive pulmonary disease) ([place])   . Family history of breast cancer    Aunt on father's side  . Hyperlipemia   . Hypertension   . MRSA (methicillin resistant staph aureus) culture positive    in breast  . Neuropathy ([place])    bilateral toes  . Nipple discharge    with pain, infection, lump  . S/P radiation therapy 01/08/11 - 02/22/11   Right Chest Wall/ 50 Gy / 25 Fractions, Right [place]/ 50  gy/25 Fractions, Right Axillary Boost/500 cGy/25 Fractions, Right Chest Wall Boost/10 Gy/5 Fractions  . Stage III (T4N3) R Br cancer  06/21/2010   Letrozole 2.5 mg which she started on 03/09/2011.  This patient presented to my office on Jun 21, 2010 with a large ulcerated right breast mass and bleeding. She underwent additional workup at that time and a biopsy was done showing invasive breast cancer. She was staged and was found to have a T4  disease that was ER, PR, HER-2 positive. CA 2729 was normal at 31. She is undergoing chemotherapy wi  . Status post chemotherapy    6 cycles of carboplatin and docetaxel with trastuzumab every 21 days followed by surgery  . Tobacco abuse 06/22/2015  .  Use of letrozole ([place]) 03/09/2011  . Wears dentures     Past Surgical History:  Procedure Laterality Date  . BREAST SURGERY  2012  . DILATION AND CURETTAGE OF UTERUS    . MASTECTOMY MODIFIED RADICAL Left 12/16/2015   Procedure: MASTECTOMY MODIFIED RADICAL;  Surgeon: Franky Macho, MD;  Location: [facility];  Service: General;  Laterality: Left;  . port a catheter insertion    . PORT-A-CATH REMOVAL Left 01/15/2013   Procedure: REMOVAL PORT-A-CATH;  Surgeon: Marlane Hatcher, MD;  Location: [facility];  Service: General;  Laterality: Left;  . PORTACATH PLACEMENT Right 01/16/2016   Procedure: INSERTION PORT-A-CATH;  Surgeon: Franky Macho, MD;  Location: [facility];  Service: General;  Laterality: Right;  . TUBAL LIGATION      Family History  Problem Relation Age of Onset  . COPD Mother   . Diabetes Father   . Hypertension Father   . COPD Brother   . Cancer Maternal Aunt     ovarian - died of old age  . Cancer Paternal Aunt     had breast ca; died of of a different cancer  . Cancer Brother     Social History   Social History  . Marital status: Married    Spouse name: N/A  . Number of children: N/A  . Years of education: N/A   Social History Main Topics  . Smoking status: Former Smoker    Packs/day: 0.25    Years: 30.00    Types: Cigarettes    Quit date: 12/15/2015  . Smokeless tobacco: Never Used  . Alcohol use No  . Drug use: No  . Sexual activity: Yes    Birth control/ protection: Post-menopausal   Other Topics Concern  . None   Social History Narrative  . None     PHYSICAL EXAMINATION  ECOG PERFORMANCE STATUS: 0 - Asymptomatic  Vitals:   02/15/16 1318  BP: (!) 146/87  Pulse: (!) 113  Resp: 16  Temp: 97.9 F (36.6 C)    GENERAL:alert, no distress, well nourished, well developed, comfortable, cooperative, obese, smiling and accompanied by friend SKIN: skin color, texture, turgor are normal, no rashes or significant lesions HEAD: Normocephalic, No masses,  lesions, tenderness or abnormalities EYES: normal, EOMI, Conjunctiva are pink and non-injected EARS: External ears normal OROPHARYNX:lips, buccal mucosa, and tongue normal and mucous membranes are moist  NECK: supple, trachea midline LYMPH:  no palpable lymphadenopathy BREAST: left chest wall examined with a small medial erythematous area inferior to surgical scar, nontender, measuring 1 cm in size.  Left axillary nodules measuring 1 cm or small that are nontender and hard to palpation. LUNGS: clear to auscultation and percussion HEART: regular rate & rhythm, no murmurs, no gallops, S1 normal and S2  normal ABDOMEN:abdomen soft and normal bowel sounds BACK: Back symmetric, no curvature. EXTREMITIES:less then 2 second capillary refill, no joint deformities, effusion, or inflammation, no skin discoloration, no cyanosis  NEURO: alert & oriented x 3 with fluent speech, no focal motor/sensory deficits, gait normal   LABORATORY DATA: CBC    Component Value Date/Time   WBC 10.5 02/15/2016 1525   RBC 4.47 02/15/2016 1525   HGB 13.7 02/15/2016 1525   HGB 10.1 (L) 11/07/2010 0917   HCT 41.8 02/15/2016 1525   HCT 41.3 12/27/2015 1553   HCT 31.1 (L) 11/07/2010 0917   PLT 335 02/15/2016 1525   PLT 406 (H) 12/27/2015 1553   MCV 93.5 02/15/2016 1525   MCV 92 12/27/2015 1553   MCV 100.3 11/07/2010 0917   MCH 30.6 02/15/2016 1525   MCHC 32.8 02/15/2016 1525   RDW 15.2 02/15/2016 1525   RDW 14.8 12/27/2015 1553   RDW 18.3 (H) 11/07/2010 0917   LYMPHSABS 1.3 02/15/2016 1525   LYMPHSABS 1.9 12/27/2015 1553   LYMPHSABS 1.6 11/07/2010 0917   MONOABS 1.2 (H) 02/15/2016 1525   MONOABS 1.4 (H) 11/07/2010 0917   EOSABS 0.2 02/15/2016 1525   EOSABS 0.2 12/27/2015 1553   BASOSABS 0.0 02/15/2016 1525   BASOSABS 0.0 12/27/2015 1553   BASOSABS 0.0 11/07/2010 0917      Chemistry      Component Value Date/Time   NA 140 02/15/2016 1525   NA 140 12/27/2015 1553   K 3.7 02/15/2016 1525   CL 100 (L)  02/15/2016 1525   CO2 30 02/15/2016 1525   BUN 16 02/15/2016 1525   BUN 22 12/27/2015 1553   CREATININE 0.63 02/15/2016 1525      Component Value Date/Time   CALCIUM 9.4 02/15/2016 1525   ALKPHOS 120 02/15/2016 1525   AST 53 (H) 02/15/2016 1525   ALT 24 02/15/2016 1525   BILITOT 0.2 (L) 02/15/2016 1525   BILITOT 0.2 10/07/2015 0859        PENDING LABS:   RADIOGRAPHIC STUDIES:  Mr Thoracic Spine W Wo Contrast  Result Date: 02/02/2016 CLINICAL DATA:  Metastatic breast cancer. Bilateral numbness to the feet. EXAM: MRI THORACIC SPINE WITHOUT AND WITH CONTRAST TECHNIQUE: Multiplanar and multiecho pulse sequences of the thoracic spine were obtained without and with intravenous contrast. CONTRAST:  83m MULTIHANCE GADOBENATE DIMEGLUMINE 529 MG/ML IV SOLN COMPARISON:  Chest CT dated 12/18/2015 and PET-CT dated 06/27/2010 FINDINGS: MRI THORACIC SPINE FINDINGS Alignment:  Physiologic. Vertebrae: There are metastatic lesions involving each thoracic vertebra from T5 through T12 as well as of the L1 and L2. The scout image demonstrates metastatic lesions involving the left side of C1, the left lateral masses of C4 and of C7 as well as the C5 vertebral body. Cord: Normal. Tumor does extend into the spinal canal just to the right and left of midline at T8 where there is a subtle pathologic fracture of the vertebral body. Tumor involves the posterior elements at T8 and to a lesser degree at several other levels in the thoracic spine. Normal conus tip is at L1-2. Paraspinal and other soft tissues: No discrete paraspinal soft tissue tumor. Disc levels: There is a small central bulge of the T11-12 disc slightly to the right of midline with no neural impingement. Tiny disc bulge to the right of midline at T 3 4. IMPRESSION: 1. Extensive metastatic disease throughout the cervical and thoracic spine with a subtle pathologic compression fracture of T8. Tumor extends through the posterior margin of the T8  vertebra  into the spinal canal without spinal cord compression at this time. 2. No other pathologic fractures of the thoracic spine at this time. Electronically Signed   By: Lorriane Shire M.D.   On: 02/02/2016 14:12   Nm Pet Image Initial (pi) Skull Base To Thigh  Addendum Date: 01/23/2016   ADDENDUM REPORT: 01/23/2016 15:36 ADDENDUM: The original report was by Dr. Van Clines. The following addendum is by Dr. Van Clines: I have one other musculoskeletal focus of hypermetabolic activity to mention. In the right latissimus dorsi muscle, there is a small hypermetabolic focus with maximum SUV of 5.4, measuring 7 mm in diameter on image 89/4, compatible with a metastatic deposit. Electronically Signed   By: Van Clines M.D.   On: 01/23/2016 15:36   Result Date: 01/23/2016 CLINICAL DATA:  Subsequent treatment strategy for breast cancer, previous on the right, recent diagnosis on the left. EXAM: NUCLEAR MEDICINE PET SKULL BASE TO THIGH TECHNIQUE: 7.6 mCi F-18 FDG was injected intravenously. Full-ring PET imaging was performed from the skull base to thigh after the radiotracer. CT data was obtained and used for attenuation correction and anatomic localization. FASTING BLOOD GLUCOSE:  Value: 73 mg/dl COMPARISON:  Multiple exams, including 06/27/2010 and 12/18/2015 FINDINGS: NECK No hypermetabolic lymph nodes in the neck. CHEST Hypermetabolic activity along the postoperative findings in the left axilla within irregular lymph node measuring 1.2 cm in short axis on image 53/4 having maximum standard uptake value of 7.3. Metabolic activity in the left breast related to recent mastectomy. There is some pleural thickening along the right anterior chest with maximum SUV of approximately 4.3, possibly rib reflecting prior radiation port. 3 mm right lower lobe nodule on image 54/8, nonspecific. ABDOMEN/PELVIS A metastatic lesion in the right hepatic lobe measuring approximately 2 cm in diameter based on the  metabolic activity has a maximum SUV of 13.9. Background liver activity approximately 4.4 There is some faintly hypermetabolic left external iliac nodes. For example, an 8 mm in short axis node on image 163 of series 4 of the CT data has a maximum SUV of 3.5. SKELETON Widespread osseous metastatic disease including the left side of the C1 vertebra, sternum, numerous additional vertebral levels especially T8, multiple destructive rib lesions especially the right ninth rib posteriorly, and multiple lesions in the sacrum, iliac bones, left ischium, and greater trochanters. The T8 vertebral lesion has maximum SUV of 12.2 and the index left sacral lesion has a maximum SUV of 10.7. Currently the hip lesions are not of a size to be threatening for fracture. IMPRESSION: 1. Widespread multifocal metastatic disease in the visualized axial and appendicular skeleton. 2. Solitary metastatic lesion in the liver. Hypermetabolic irregular left axillary lymph node adjacent to the left axillary clips. 3. Activity in the left breast related to mastectomy. 4. Low-grade activity along the pleuroparenchymal thickening anteriorly in the right chest is probably related to prior therapy rather than current malignancy. 5. Probable mild compression fracture at T8 associated with the extensive tumor at this level. There is also bony destruction of the right ninth rib posteriorly. 6. Faintly hypermetabolic but small left external iliac lymph nodes merit surveillance. Electronically Signed: By: Van Clines M.D. On: 01/23/2016 14:54   US Biopsy  Result Date: 02/10/2016 CLINICAL DATA:  Metastatic breast carcinoma with right lobe liver lesion. EXAM: ULTRASOUND GUIDED CORE BIOPSY OF LIVER COMPARISON:  PET scan on 01/23/2016 MEDICATIONS: 3.0 mg IV Versed; 50 mcg IV Fentanyl Total Moderate Sedation Time: 14 minutes. The patient's level of  consciousness and physiologic status were continuously monitored during the procedure by Radiology  nursing. PROCEDURE: The procedure, risks, benefits, and alternatives were explained to the patient. Questions regarding the procedure were encouraged and answered. The patient understands and consents to the procedure. A time out was performed prior to initiating the procedure. The right sided abdominal wall was prepped with chlorhexidine in a sterile fashion, and a sterile drape was applied covering the operative field. A sterile gown and sterile gloves were used for the procedure. Local anesthesia was provided with 1% Lidocaine. Ultrasound was performed to localize a lesion within the right lobe of the liver. Under direct ultrasound guidance, a 17 gauge trocar needle was advanced to the level of the lesion. A total of three 18 gauge core biopsy samples were obtained through the lesion utilizing an 18 gauge core biopsy device. Core biopsy samples were submitted in formalin. The outer needle was removed and additional ultrasound performed. COMPLICATIONS: None. FINDINGS: Discrete rounded hypoechoic mass identified in the right lobe of the liver by ultrasound corresponds to the hypermetabolic lesion seen by recent PET scan. This lesion measures approximately 2 cm in diameter. Solid tissue was obtained. IMPRESSION: Ultrasound-guided core biopsy performed of a 2 cm mass within the right lobe of the liver. Electronically Signed   By: Aletta Edouard M.D.   On: 02/10/2016 16:07     PATHOLOGY:    ASSESSMENT AND PLAN:  [name], stage 4, left (HCC) Stage IV (pT3pN2ApM1) invasive ductal carcinoma of left/right breast with biopsy proven disease to liver and radiographic findings suspicious for osseous involvement.  ER/PR+ 2%, HER2 NEGATIVE.  Complicated history with right breast cancer, Stage IIIC, ER/PR+ and HER 2 POSITIVE in 2012 managed with right mastectomy, systemic chemotherapy, HER2 targeted therapy, XRT, and anti-endocrine therapy with left sided breast cancer in 2017 with  mastectomy in Nov 2017 by Dr. Arnoldo Morale demonstrating invasive lobular carcinoma, ER/PR+ (2%) and HER2 NEGATIVE.  Biopsy of liver lesion in Jan 2018 demonstrated DUCTAL carcinoma (breast prognostic profile pending at this time) which may be a recurrence of her first cancer in 2012 instead of a new cancer.  I personally reviewed and went over radiographic studies with the patient.  The results are noted within this dictation.  MRI T-spine results reviewed.  Osseous lesions noted.  I personally reviewed and went over pathology results with the patient.  Will start Letrozole + Kisqali at this time.  She has been on Letrozole since 2013 for her first cancer.  I reviewed the risks, benefits, alternatives, and side effects of Kisqali including, but not limited to, fatigue, headaches, insomnia, alopecia, skin rash, pruritis, nausea, vomiting, diarrhea, constipation, decreased appetite, stomatitis, decreased blood counts, increase risk for infection, change in liver function results, back pain, worsening renal function.  Patient provided with a free combo-kit for Letrozole + Kisqali.  She will have chemotherapy teaching by Anderson Malta, nurse navigator, today.  Rx for doxycycline 100 mg BID x 7 days is escribed for her left breast area and left axillary nodules.  If not improved, will get her back to Dr. Arnoldo Morale for evaluation/management.  Baseline labs today: CBC diff, CMET, magnesium, phosphorus.  I personally reviewed and went over laboratory results with the patient.  The results are noted within this dictation.  Labs every 2 weeks x first 2 cycles.  Baseline EKG today.  EKG on day 15 cycle 1 and day 1 cycle 2.  Based upon breast prognostic profile, we may need to consider a change  in therapy.  Will need to start bone targeted therapy for bone metastases in the future.  [facility] supportive therapy plan is built.  Once approved, we will institute this therapy after reviewing the risks, benefits,  alternatives, and side effects.  For T8 compression fracture will refer patient to XRT therapy for palliative treatment.  Return in 2 weeks for follow-up.   ORDERS PLACED FOR THIS ENCOUNTER: Orders Placed This Encounter  Procedures  . CBC with Differential  . Comprehensive metabolic panel  . Magnesium  . Phosphorus  . EKG 12-Lead    MEDICATIONS PRESCRIBED THIS ENCOUNTER: Meds ordered this encounter  Medications  . doxycycline (VIBRA-TABS) 100 MG tablet    Sig: Take 1 tablet (100 mg total) by mouth 2 (two) times daily.    Dispense:  14 tablet    Refill:  0    Order Specific Question:   Supervising Provider    Answer:   Patrici Ranks [id number]  . heparin lock flush 100 unit/mL  . sodium chloride flush (NS) 0.9 % injection 10 mL  . Ribociclib Succinate (KISQALI 600 DOSE) 200 MG TABS    Sig: Take 600 mg by mouth daily. Days 1-21 every 28 days    Dispense:  21 tablet    Refill:  0    Cycle 1    Order Specific Question:   Supervising Provider    Answer:   Patrici Ranks [id number]    THERAPY PLAN:  Letrozole + Kisqali.  Will start bone directed therapy in the future.  Will refer patient to XRT for T8 pathologic fracture.  All questions were answered. The patient knows to call the clinic with any problems, questions or concerns. We can certainly see the patient much sooner if necessary.  Patient and plan discussed with Dr. Ancil Linsey and she is in agreement with the aforementioned.   This note is electronically signed by: Doy Mince 02/15/2016 5:24 PM

## 2016-02-15 NOTE — Progress Notes (Signed)
Chemotherapy teaching completed over the phone on kisqali.

## 2016-02-16 ENCOUNTER — Telehealth (HOSPITAL_COMMUNITY): Payer: Self-pay | Admitting: Emergency Medicine

## 2016-02-16 ENCOUNTER — Encounter (HOSPITAL_COMMUNITY): Payer: Self-pay | Admitting: Lab

## 2016-02-16 NOTE — Telephone Encounter (Signed)
Called pt to let her know that Gershon Mussel talked with radiation oncology about her T8 compression fracture in her back and we are going to refer her to Radiation oncology in [place].  They they radiation will help heal that fracture faster.  She verbalized understanding.

## 2016-02-16 NOTE — Progress Notes (Unsigned)
Referral sent to [facility]. Records faxed on 11/1 

## 2016-02-17 ENCOUNTER — Ambulatory Visit (HOSPITAL_COMMUNITY): Payer: PRIVATE HEALTH INSURANCE

## 2016-02-17 ENCOUNTER — Ambulatory Visit (HOSPITAL_COMMUNITY): Payer: PRIVATE HEALTH INSURANCE | Admitting: Oncology

## 2016-02-28 ENCOUNTER — Encounter (HOSPITAL_BASED_OUTPATIENT_CLINIC_OR_DEPARTMENT_OTHER): Payer: PRIVATE HEALTH INSURANCE | Admitting: Oncology

## 2016-02-28 ENCOUNTER — Other Ambulatory Visit (HOSPITAL_COMMUNITY): Payer: Self-pay | Admitting: Pharmacist

## 2016-02-28 ENCOUNTER — Telehealth (HOSPITAL_COMMUNITY): Payer: Self-pay | Admitting: Hematology & Oncology

## 2016-02-28 ENCOUNTER — Other Ambulatory Visit (HOSPITAL_COMMUNITY): Payer: PRIVATE HEALTH INSURANCE

## 2016-02-28 ENCOUNTER — Encounter (HOSPITAL_COMMUNITY): Payer: Self-pay | Admitting: Oncology

## 2016-02-28 VITALS — BP 131/81 | HR 98 | Temp 97.8°F | Resp 20 | Wt 154.1 lb

## 2016-02-28 DIAGNOSIS — C50912 Malignant neoplasm of unspecified site of left female breast: Secondary | ICD-10-CM

## 2016-02-28 DIAGNOSIS — C773 Secondary and unspecified malignant neoplasm of axilla and upper limb lymph nodes: Secondary | ICD-10-CM

## 2016-02-28 DIAGNOSIS — C50911 Malignant neoplasm of unspecified site of right female breast: Secondary | ICD-10-CM | POA: Diagnosis not present

## 2016-02-28 DIAGNOSIS — C7951 Secondary malignant neoplasm of bone: Secondary | ICD-10-CM | POA: Diagnosis not present

## 2016-02-28 DIAGNOSIS — C787 Secondary malignant neoplasm of liver and intrahepatic bile duct: Secondary | ICD-10-CM

## 2016-02-28 NOTE — Progress Notes (Signed)
Sheila Dun, FNP [place] [address]  [name], stage 4, left ([facility]) - Plan: CBC with Differential, Comprehensive metabolic panel, NM Cardiac [facility]  Bone metastases (HCC)  CURRENT THERAPY: Changing therapy to Docetaxel/Herceptin/Perjeta  INTERVAL HISTORY: Sheila Garrison 61 y.o. 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 AND Invasive lobular left breast cancer, Stage IIB (pT3pN2A), S/P left mastectomy by Dr. Arnoldo Morale on 12/16/2015, ER/PR+ 2%.  HER2 NEGATIVE.  Done prior to knowing about Stage IV disease. 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 Margot Chimes, systemic chemotherapy by Dr. Jana Hakim in 2012 and Herceptin x 52 weeks finishing on 07/10/2011, XRT (01/04/2011- 02/22/2011), and anti-endocrine therapy.    Stage III (T4N3) R Br cancer    06/21/2010 Initial Diagnosis    Stage III (T4N3) R Br cancer       01/04/2011 - 02/22/2011 Radiation Therapy    50 Gy       05/07/2014 Pathology Results    BCI testing- low risk of late recurrence (3.4% between years 5-10), high likelihood of benefit from extended endocrein therapy, and a 67% relative risk reduction when treated with extended endocrine therapy (27% versus 10.5%).       [name], stage 4, left ([facility])   11/22/2015 Mammogram    Ultrasound-guided biopsy of the irregular hypoechoic area, with prominent shadowing, in the OUTER left breast at the 1 o'clock axis. 2. Ultrasound-guided biopsy of the irregular mass within the INNER left breast at the 9 o'clock axis. 3. Ultrasound-guided biopsy of 1 of the enlarged and amorphous lymph nodes in the left axilla.      11/22/2015 Imaging    Ultrasound-guided biopsy of the irregular hypoechoic area, with prominent shadowing, in the  OUTER left breast at the 1 o'clock axis. 2. Ultrasound-guided biopsy of the irregular mass within the INNER left breast at the 9 o'clock axis. 3. Ultrasound-guided biopsy of 1 of the enlarged and amorphous lymph nodes in the left axilla.      12/16/2015 Surgery    L modified radical mastectomy with Dr. Arnoldo Morale      12/18/2015 - 12/21/2015 Hospital Admission    Admit date: 12/18/2015 Admission diagnosis: altered MS, acute respiratory failure Additional comments: This likely was a combination of COPD exacerbation along with mild acute on chronic diastolic CHF with  echo shows a preserved EF of around 60%, he was treated with IV steroids and Lasix, she is much better but qualifies for home oxygen which will be provided      12/19/2015 Imaging    No evidence of pulmonary embolus. 2. Small right pleural effusion, with associated atelectasis. Peripheral scarring at the anterior right upper lobe. Mild bilateral emphysema noted. 3. Scattered coronary artery calcifications seen. 4. Postoperative change at the left chest wall, reflecting recent mastectomy, with vague collections of postoperative fluid seen. 5. Vague sclerotic change and heterogeneity at vertebral body T8 raises concern for sequelae of metastatic disease      01/23/2016 PET scan    1. Widespread multifocal metastatic disease in the visualized axial and appendicular skeleton. 2. Solitary metastatic lesion in the liver. Hypermetabolic irregular left axillary lymph node adjacent to the left axillary clips. 3. Activity in the left breast related to mastectomy. 4. Low-grade activity along the pleuroparenchymal thickening anteriorly in the right  chest is probably related to prior therapy rather than current malignancy. 5. Probable mild compression fracture at T8 associated with the extensive tumor at this level. There is also bony destruction of the right ninth rib posteriorly. 6. Faintly hypermetabolic but small left external  iliac lymph nodes merit surveillance  ADDENDUM: The original report was by Dr. Van Clines. The following addendum is by Dr. Van Clines:  I have one other musculoskeletal focus of hypermetabolic activity to mention. In the right latissimus dorsi muscle, there is a small hypermetabolic focus with maximum SUV of 5.4, measuring 7 mm in diameter on image 89/4, compatible with a metastatic deposit.        02/02/2016 Imaging    MRI T-spine: 1. Extensive metastatic disease throughout the cervical and thoracic spine with a subtle pathologic compression fracture of T8. Tumor extends through the posterior margin of the T8 vertebra into the spinal canal without spinal cord compression at this time. 2. No other pathologic fractures of the thoracic spine at this time.      02/10/2016 Procedure    US biopsy of liver lesion      02/15/2016 Pathology Results    Liver, needle/core biopsy, right lobe METASTATIC CARCINOMA, CONSISTENT WITH BREAST DUCTAL CARCINOMA.      02/15/2016 - 02/28/2016 Chemotherapy    Letrozole 2.5 mg daily + Kisqali (600 mg) days 1-21 every 28 days       02/21/2016 Pathology Results    Breast Prognostic profile from liver biopsy: HER2 POSITIVE, ER/PR NEGATIVE (0%).      02/27/2016 -  Radiation Therapy    Palliative XRT to T8 pathologic fracture.      02/28/2016 Treatment Plan Change    Due to HER2 positivity and ER/PR negativity from liver biopsy, change to systemic chemotherapy is recommended.      She is here for tolerance check with Letrozole + Kisqali.  She is tolerating well without any issues.    Also, now with breast prognostic profile completed on liver biopsy lesion, she is here to learn about her change in treatment plan.  I reviewed her pathology from start to finish, from 2012 to now.  She learned today that her liver biopsy was HER2 POSITIVE and ER/PR NEGATIVE.  As a result, she needs a change to systemic chemotherapy.    She has  maintained her port access.  She started XRT yesterday to her back lesion to help with symptom control.  She denies any issues at this time related to XRT.  Review of Systems  Constitutional: Negative.  Negative for chills, fever and weight loss.  HENT: Negative.   Eyes: Negative.  Negative for blurred vision and double vision.  Respiratory: Negative.  Negative for cough.   Cardiovascular: Negative.  Negative for chest pain.  Gastrointestinal: Negative.  Negative for abdominal pain, constipation, diarrhea, nausea and vomiting.  Genitourinary: Negative.   Musculoskeletal: Positive for back pain.  Skin: Negative.   Neurological: Negative.  Negative for weakness.  Endo/Heme/Allergies: Negative.   Psychiatric/Behavioral: Negative.     Past Medical History:  Diagnosis Date  . Anxiety   . Bone metastases ([facility]) 02/15/2016  . Breast CA ([facility]) 07/01/2010   rt breast ca  . COPD (chronic obstructive pulmonary disease) ([facility])   . Family history of breast cancer    Aunt on father's side  . Hyperlipemia   . Hypertension   . MRSA (methicillin resistant staph aureus) culture positive    in breast  . Neuropathy ([facility] [place])    bilateral  toes  . Nipple discharge    with pain, infection, lump  . S/P radiation therapy 01/08/11 - 02/22/11   Right Chest Wall/ 50 Gy / 25 Fractions, Right [place]/ 50 gy/25 Fractions, Right Axillary Boost/500 cGy/25 Fractions, Right Chest Wall Boost/10 Gy/5 Fractions  . Stage III (T4N3) R Br cancer  06/21/2010   Letrozole 2.5 mg which she started on 03/09/2011.  This patient presented to my office on Jun 21, 2010 with a large ulcerated right breast mass and bleeding. She underwent additional workup at that time and a biopsy was done showing invasive breast cancer. She was staged and was found to have a T4  disease that was ER, PR, HER-2 positive. CA 2729 was normal at 31. She is undergoing chemotherapy wi  . Status post chemotherapy    6 cycles of carboplatin and docetaxel with  trastuzumab every 21 days followed by surgery  . Tobacco abuse 06/22/2015  . Use of letrozole ([facility]) 03/09/2011  . Wears dentures     Past Surgical History:  Procedure Laterality Date  . BREAST SURGERY  2012  . DILATION AND CURETTAGE OF UTERUS    . MASTECTOMY MODIFIED RADICAL Left 12/16/2015   Procedure: MASTECTOMY MODIFIED RADICAL;  Surgeon: Aviva Signs, MD;  Location: [facility];  Service: General;  Laterality: Left;  . port a catheter insertion    . PORT-A-CATH REMOVAL Left 01/15/2013   Procedure: REMOVAL PORT-A-CATH;  Surgeon: Scherry Ran, MD;  Location: [facility];  Service: General;  Laterality: Left;  . PORTACATH PLACEMENT Right 01/16/2016   Procedure: INSERTION PORT-A-CATH;  Surgeon: Aviva Signs, MD;  Location: [facility];  Service: General;  Laterality: Right;  . TUBAL LIGATION      Family History  Problem Relation Age of Onset  . COPD Mother   . Diabetes Father   . Hypertension Father   . COPD Brother   . Cancer Maternal Aunt     ovarian - died of old age  . Cancer Paternal Aunt     had breast ca; died of of a different cancer  . Cancer Brother     Social History   Social History  . Marital status: Married    Spouse name: N/A  . Number of children: N/A  . Years of education: N/A   Social History Main Topics  . Smoking status: Former Smoker    Packs/day: 0.25    Years: 30.00    Types: Cigarettes    Quit date: 12/15/2015  . Smokeless tobacco: Never Used  . Alcohol use No  . Drug use: No  . Sexual activity: Yes    Birth control/ protection: Post-menopausal   Other Topics Concern  . None   Social History Narrative  . None     PHYSICAL EXAMINATION  ECOG PERFORMANCE STATUS: 0 - Asymptomatic  Vitals:   02/28/16 1400  BP: 131/81  Pulse: 98  Resp: 20  Temp: 97.8 F (36.6 C)    GENERAL:alert, no distress, well nourished, well developed, comfortable, cooperative, obese, smiling and accompanied by church friend SKIN: skin color, texture, turgor are  normal, no rashes or significant lesions HEAD: Normocephalic, No masses, lesions, tenderness or abnormalities EYES: normal, EOMI, Conjunctiva are pink and non-injected EARS: External ears normal OROPHARYNX:lips, buccal mucosa, and tongue normal and mucous membranes are moist  NECK: supple, trachea midline LYMPH:  no palpable lymphadenopathy BREAST: not examined LUNGS: clear to auscultation and percussion HEART: regular rate & rhythm, no murmurs, no gallops, S1 normal and S2 normal  ABDOMEN:abdomen soft and normal bowel sounds BACK: Back symmetric, no curvature. EXTREMITIES:less then 2 second capillary refill, no joint deformities, effusion, or inflammation, no skin discoloration, no cyanosis  NEURO: alert & oriented x 3 with fluent speech, no focal motor/sensory deficits, gait normal   LABORATORY DATA: CBC    Component Value Date/Time   WBC 10.5 02/15/2016 1525   RBC 4.47 02/15/2016 1525   HGB 13.7 02/15/2016 1525   HGB 10.1 (L) 11/07/2010 0917   HCT 41.8 02/15/2016 1525   HCT 41.3 12/27/2015 1553   HCT 31.1 (L) 11/07/2010 0917   PLT 335 02/15/2016 1525   PLT 406 (H) 12/27/2015 1553   MCV 93.5 02/15/2016 1525   MCV 92 12/27/2015 1553   MCV 100.3 11/07/2010 0917   MCH 30.6 02/15/2016 1525   MCHC 32.8 02/15/2016 1525   RDW 15.2 02/15/2016 1525   RDW 14.8 12/27/2015 1553   RDW 18.3 (H) 11/07/2010 0917   LYMPHSABS 1.3 02/15/2016 1525   LYMPHSABS 1.9 12/27/2015 1553   LYMPHSABS 1.6 11/07/2010 0917   MONOABS 1.2 (H) 02/15/2016 1525   MONOABS 1.4 (H) 11/07/2010 0917   EOSABS 0.2 02/15/2016 1525   EOSABS 0.2 12/27/2015 1553   BASOSABS 0.0 02/15/2016 1525   BASOSABS 0.0 12/27/2015 1553   BASOSABS 0.0 11/07/2010 0917      Chemistry      Component Value Date/Time   NA 140 02/15/2016 1525   NA 140 12/27/2015 1553   K 3.7 02/15/2016 1525   CL 100 (L) 02/15/2016 1525   CO2 30 02/15/2016 1525   BUN 16 02/15/2016 1525   BUN 22 12/27/2015 1553   CREATININE 0.63 02/15/2016  1525      Component Value Date/Time   CALCIUM 9.4 02/15/2016 1525   ALKPHOS 120 02/15/2016 1525   AST 53 (H) 02/15/2016 1525   ALT 24 02/15/2016 1525   BILITOT 0.2 (L) 02/15/2016 1525   BILITOT 0.2 10/07/2015 0859        PENDING LABS:   RADIOGRAPHIC STUDIES:  Mr Thoracic Spine W Wo Contrast  Result Date: 02/02/2016 CLINICAL DATA:  Metastatic breast cancer. Bilateral numbness to the feet. EXAM: MRI THORACIC SPINE WITHOUT AND WITH CONTRAST TECHNIQUE: Multiplanar and multiecho pulse sequences of the thoracic spine were obtained without and with intravenous contrast. CONTRAST:  50m MULTIHANCE GADOBENATE DIMEGLUMINE 529 MG/ML IV SOLN COMPARISON:  Chest CT dated 12/18/2015 and PET-CT dated 06/27/2010 FINDINGS: MRI THORACIC SPINE FINDINGS Alignment:  Physiologic. Vertebrae: There are metastatic lesions involving each thoracic vertebra from T5 through T12 as well as of the L1 and L2. The scout image demonstrates metastatic lesions involving the left side of C1, the left lateral masses of C4 and of C7 as well as the C5 vertebral body. Cord: Normal. Tumor does extend into the spinal canal just to the right and left of midline at T8 where there is a subtle pathologic fracture of the vertebral body. Tumor involves the posterior elements at T8 and to a lesser degree at several other levels in the thoracic spine. Normal conus tip is at L1-2. Paraspinal and other soft tissues: No discrete paraspinal soft tissue tumor. Disc levels: There is a small central bulge of the T11-12 disc slightly to the right of midline with no neural impingement. Tiny disc bulge to the right of midline at T 3 4. IMPRESSION: 1. Extensive metastatic disease throughout the cervical and thoracic spine with a subtle pathologic compression fracture of T8. Tumor extends through the posterior margin of the T8 vertebra  into the spinal canal without spinal cord compression at this time. 2. No other pathologic fractures of the thoracic spine  at this time. Electronically Signed   By: Lorriane Shire M.D.   On: 02/02/2016 14:12   US Biopsy  Result Date: 02/10/2016 CLINICAL DATA:  Metastatic breast carcinoma with right lobe liver lesion. EXAM: ULTRASOUND GUIDED CORE BIOPSY OF LIVER COMPARISON:  PET scan on 01/23/2016 MEDICATIONS: 3.0 mg IV Versed; 50 mcg IV Fentanyl Total Moderate Sedation Time: 14 minutes. The patient's level of consciousness and physiologic status were continuously monitored during the procedure by Radiology nursing. PROCEDURE: The procedure, risks, benefits, and alternatives were explained to the patient. Questions regarding the procedure were encouraged and answered. The patient understands and consents to the procedure. A time out was performed prior to initiating the procedure. The right sided abdominal wall was prepped with chlorhexidine in a sterile fashion, and a sterile drape was applied covering the operative field. A sterile gown and sterile gloves were used for the procedure. Local anesthesia was provided with 1% Lidocaine. Ultrasound was performed to localize a lesion within the right lobe of the liver. Under direct ultrasound guidance, a 17 gauge trocar needle was advanced to the level of the lesion. A total of three 18 gauge core biopsy samples were obtained through the lesion utilizing an 18 gauge core biopsy device. Core biopsy samples were submitted in formalin. The outer needle was removed and additional ultrasound performed. COMPLICATIONS: None. FINDINGS: Discrete rounded hypoechoic mass identified in the right lobe of the liver by ultrasound corresponds to the hypermetabolic lesion seen by recent PET scan. This lesion measures approximately 2 cm in diameter. Solid tissue was obtained. IMPRESSION: Ultrasound-guided core biopsy performed of a 2 cm mass within the right lobe of the liver. Electronically Signed   By: Aletta Edouard M.D.   On: 02/10/2016 16:07     PATHOLOGY:    ASSESSMENT AND PLAN:  Invasive  ductal carcinoma of breast, stage 4, left (HCC) Stage IV (pT3pN2ApM1) invasive ductal carcinoma, biopsy proven to liver, HER2 POSITIVE, ER/PR NEGATIVE.  Complicated by a LEFT invasive lobular carcinoma, Stage IIB (pT3pN2A), S/P left mastectomy by Dr. Arnoldo Morale on 12/16/2015, ER/PR+ 2%.  HER2 NEGATIVE.  Done prior to knowing about Stage IV disease. AND History with right invasive ductal breast cancer, Stage IIIC, ER/PR+ and HER 2 POSITIVE in 2012 managed with right mastectomy, systemic chemotherapy, HER2 targeted therapy, XRT, and anti-endocrine therapy   She started palliative XRT to T8 pathologic fracture on 02/27/2016.  I personally reviewed and went over pathology results with the patient.  I reviewed her pathology from 2012 to present.  She is educated about her recent results from her breast prognostic profile.  She is advised that based upon these results, we suspect her initial breast cancer has returned and is metastatic.   She is advised to stop her Kisqali.  For now, she can continue her Letrozole  She can HOLD her Letrozole when chemotherapy starts.  I discussed Docetaxel/Herceptin/Perjeta.  I reviewed the risks, benefits, alternatives, and side effects of this intervention.  We reviewed side effects, including, but not limited to neuropathy, alopecia, dermatologic reaction, nail disease, fluid retention, stomatitis, diarrhea, nausea, vomiting, decreased blood counts, increased risk for infection, change in renal and liver tests, weakness, tiredness, pulmonary reaction, fever, change in left ventricular ejection fraction, arthralgia.  Order is placed for MUGA to evaluate LVEF prior to start of treatment.  Pre-treatment labs: CBC diff, CMET.    Return on Day  8 of cycle 1 for NADIR check with labs.  Bone metastases (HCC) Bone metastases with T8 compression fracture.  Will start Xgeva on day 1, cycle 1.  Supportive therapy plan is built.   ORDERS PLACED FOR THIS ENCOUNTER: Orders  Placed This Encounter  Procedures  . NM Cardiac [place]  . CBC with Differential  . Comprehensive metabolic panel    MEDICATIONS PRESCRIBED THIS ENCOUNTER: No orders of the defined types were placed in this encounter.   THERAPY PLAN:  Continue palliative XRT to T8.  Will change therapy to Docetaxel/Herceptin/Perjeta.  All questions were answered. The patient knows to call the clinic with any problems, questions or concerns. We can certainly see the patient much sooner if necessary.  Patient and plan discussed with Dr. Ancil Linsey and she is in agreement with the aforementioned.   This note is electronically signed by: Doy Mince 02/28/2016 6:24 PM

## 2016-02-28 NOTE — Patient Instructions (Addendum)
[facility] at [facility] Discharge Instructions  RECOMMENDATIONS MADE BY THE CONSULTANT AND ANY TEST RESULTS WILL BE SENT TO YOUR REFERRING PHYSICIAN.  You were seen today by Kirby Crigler PA-C. Stop Kisqali. Continue Letrozole. Change treatment to Chemo, discuss with Shellia Carwin, Nurse Navigator. Jennifer to schedule follow up appointments.    Thank you for choosing [facility] to provide your oncology and hematology care.  To afford each patient quality time with our provider, please arrive at least 15 minutes before your scheduled appointment time.    If you have a lab appointment with the [facility] please come in thru the  Main Entrance and check in at the main information desk  You need to re-schedule your appointment should you arrive 10 or more minutes late.  We strive to give you quality time with our providers, and arriving late affects you and other patients whose appointments are after yours.  Also, if you no show three or more times for appointments you may be dismissed from the clinic at the providers discretion.     Again, thank you for choosing [facility].  Our hope is that these requests will decrease the amount of time that you wait before being seen by our physicians.       _____________________________________________________________  Should you have questions after your visit to [facility], please contact our office at [phone] between the hours of 8:30 a.m. and 4:30 p.m.  Voicemails left after 4:30 p.m. will not be returned until the following business day.  For prescription refill requests, have your pharmacy contact our office.       Resources For Cancer Patients and their Caregivers ? American Cancer Society: Can assist with transportation, wigs, general needs, runs Look Good Feel Better.        2765669757 ? Cancer Care: Provides financial assistance, online  support groups, medication/co-pay assistance.  1-800-813-HOPE 973-526-0450) ? [place] Assists [place] cancer patients and their families through emotional , educational and financial support.  305-330-1593 ? [place] DSS Where to apply for food stamps, Medicaid and utility assistance. 667-579-4793 ? [facility]: Transportation to medical appointments. (424)782-2496 ? Social Security Administration: May apply for disability if have a Stage IV cancer. (805)195-6041 254-420-5645 ? [employer], Disability and Transit Services: Assists with nutrition, care and transit needs. [place] Support Programs: @10RELATIVEDAYS @ > Cancer Support Group  2nd Tuesday of the month 1pm-2pm, Journey Room  > Creative Journey  3rd Tuesday of the month 1130am-1pm, Journey Room  > Look Good Feel Better  1st Wednesday of the month 10am-12 noon, Journey Room (Call [place] to register [phone])

## 2016-02-28 NOTE — Progress Notes (Signed)
When to see pt during follow up visit.  Pt is changing therapy.  I set up first chemotherapy visit and the nurses in the back will teach at the bedside.  I am writing her out of work until the end of feb and faxing it today.

## 2016-02-28 NOTE — Assessment & Plan Note (Signed)
Bone metastases with T8 compression fracture.  Will start Xgeva on day 1, cycle 1.  Supportive therapy plan is built.

## 2016-02-28 NOTE — Assessment & Plan Note (Addendum)
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  Complicated by a LEFT invasive lobular carcinoma, Stage IIB (pT3pN2A), S/P left mastectomy by Dr. Arnoldo Morale on 12/16/2015, ER/PR+ 2%.  HER2 NEGATIVE.  Done prior to knowing about Stage IV disease. AND History with right invasive ductal breast cancer, Stage IIIC, ER/PR+ and HER 2 POSITIVE in 2012 managed with right mastectomy, systemic chemotherapy, HER2 targeted therapy, XRT, and anti-endocrine therapy   She started palliative XRT to T8 pathologic fracture on 02/27/2016.  I personally reviewed and went over pathology results with the patient.  I reviewed her pathology from 2012 to present.  She is educated about her recent results from her breast prognostic profile.  She is advised that based upon these results, we suspect her initial breast cancer has returned and is metastatic.   She is advised to stop her Kisqali.  For now, she can continue her Letrozole  She can HOLD her Letrozole when chemotherapy starts.  I discussed Docetaxel/Herceptin/Perjeta.  I reviewed the risks, benefits, alternatives, and side effects of this intervention.  We reviewed side effects, including, but not limited to neuropathy, alopecia, dermatologic reaction, nail disease, fluid retention, stomatitis, diarrhea, nausea, vomiting, decreased blood counts, increased risk for infection, change in renal and liver tests, weakness, tiredness, pulmonary reaction, fever, change in left ventricular ejection fraction, arthralgia.  Order is placed for MUGA to evaluate LVEF prior to start of treatment.  Pre-treatment labs: CBC diff, CMET.    Return on Day 8 of cycle 1 for NADIR check with labs.

## 2016-02-28 NOTE — Telephone Encounter (Signed)
PER ROBIN A  XGEVA DOES NOT REQ AUTH.  [facility] [phone] CASE MGT FAX# [phone]

## 2016-02-29 ENCOUNTER — Other Ambulatory Visit (HOSPITAL_COMMUNITY): Payer: Self-pay | Admitting: Hematology & Oncology

## 2016-02-29 ENCOUNTER — Other Ambulatory Visit (HOSPITAL_COMMUNITY): Payer: Self-pay | Admitting: Emergency Medicine

## 2016-02-29 DIAGNOSIS — Z7189 Other specified counseling: Secondary | ICD-10-CM | POA: Insufficient documentation

## 2016-02-29 NOTE — Progress Notes (Signed)
START ON PATHWAY REGIMEN - Breast  DQV500: Docetaxel + Trastuzumab + Pertuzumab (THP) q21 Days Until Progression or Toxicity   A cycle is every 21 days:     Pertuzumab (Perjeta(R)) 840 mg flat dose IV in 250 mL NS over 60 minutes as a loading dose cycle 1 only. See observation note below. Dose Mod: None     Pertuzumab (Perjeta(R)) 420 mg flat dose IV in 250 mL NS over 30 minutes as maintenance dose cycles 2 and beyond. An observation period of 30-60 minutes is recommended after each dose prior to subsequent infusion of trastuzumab or docetaxel. Dose Mod: None     Trastuzumab (Herceptin(R)) 8 mg/kg IV in 250 mL NS over 90 minutes as a loading dose cycle 1 only. Dose Mod: None     Trastuzumab (Herceptin(R)) 6 mg/kg IV in 250 mL NS over 30 minutes as maintenance dose cycles 2 and beyond. Dose Mod: None     Docetaxel (Taxotere(R)) 75 mg/m2 IV in 250 mL NS over 1 hour. Study considered docetaxel as optional beyond the 6th cycle. Dose Mod: None Additional Orders: Premedicate with dexamethasone 8 mg PO bid for three days beginning 1 day prior to docetaxel therapy. Recommended Monitoring: LVEF baseline and q3-4 months or with the development of cardiac symptoms and regimen changes for metastatic treatment.  **Always confirm dose/schedule in your pharmacy ordering system**    Patient Characteristics: Metastatic Chemotherapy, HER2/neu Positive, ER -Sheila Garrison, First Line AJCC Stage Grouping: IV Current Disease Status: Distant Metastases AJCC M Stage: 1 ER Status: Negative (-) AJCC N Stage: 2 AJCC T Stage: 3 HER2/neu: Positive (+) PR Status: Negative (-) Line of therapy: First Line Would you be surprised if this patient died  in the next year? I would be surprised if this patient died in the next year  Intent of Therapy: Non-Curative / Palliative Intent, Discussed with Patient

## 2016-03-01 ENCOUNTER — Encounter (HOSPITAL_COMMUNITY): Payer: Self-pay | Admitting: Emergency Medicine

## 2016-03-01 ENCOUNTER — Other Ambulatory Visit (HOSPITAL_COMMUNITY): Payer: Self-pay | Admitting: Oncology

## 2016-03-01 MED ORDER — LIDOCAINE-PRILOCAINE 2.5-2.5 % EX CREA: TOPICAL_CREAM | CUTANEOUS | 2 refills | Status: AC

## 2016-03-01 MED ORDER — PROCHLORPERAZINE MALEATE 10 MG PO TABS: 10.0000 mg | ORAL_TABLET | Freq: Four times a day (QID) | ORAL | 2 refills | Status: DC | PRN

## 2016-03-01 MED ORDER — DEXAMETHASONE 4 MG PO TABS: ORAL_TABLET | ORAL | 2 refills | Status: DC

## 2016-03-01 MED ORDER — ONDANSETRON HCL 8 MG PO TABS: 8.0000 mg | ORAL_TABLET | Freq: Three times a day (TID) | ORAL | 2 refills | Status: DC | PRN

## 2016-03-01 NOTE — Progress Notes (Signed)
Chemotherapy teaching pulled together and appts made. 

## 2016-03-01 NOTE — Patient Instructions (Signed)
[place]   CHEMOTHERAPY INSTRUCTIONS  You have been diagnosed with Stage 4 Breast Cancer.   We are going to treat you with taxotere, herceptin, and perjeta.   This treatment is with palliative intent, which means you are treatable but not curable.   You will see the doctor regularly throughout treatment.  We monitor your lab work prior to every treatment.  The doctor monitors your response to treatment by the way you are feeling, your blood work, and scans periodically.    POTENTIAL SIDE EFFECTS OF TREATMENT:  Docetaxel (Generic Name) Other Names: Taxotere  About This Drug Docetaxel is used to treat cancer. This drug is given in the vein (IV).    Possible Side Effects (More Common) . Bone marrow depression. This is a decrease in the number of white blood cells, red blood cells, and platelets. This may raise your risk of infection, make you tired and weak (fatigue), and raise your risk of bleeding. . Swelling of your legs, ankles, and/or feet. Steroids are often given to lessen this swelling. . Hair loss: Hair loss is often complete scalp hair loss and can involve loss of eyebrows, eyelashes, and pubic hair. You may notice this a few days or weeks after treatment has started. There have been cases of permanent hair loss reported. . Loose bowel movements (diarrhea) that may last for a few days. . Nausea and throwing up (vomiting). These symptoms may happen within a few hours after your treatment and may last up to 24 hours. Medicines are available to stop or lessen these side effects. . Soreness of the mouth and throat. You may have red areas, white patches, or sores that hurt. . Effects on the nerves are called peripheral neuropathy. You may feel numbness, tingling, or pain in your hands and feet. It may be hard for you to button your clothes, open jars, or walk as usual. The effect on the nerves may get worse with more doses of the drug. These effects get  better in some people after the drug is stopped but it does not get better in all people. . Skin and nail changes. You may develop a rash or have skin redness and swelling followed by peeling. Nail problems may occur such as changes in nail color, nails becoming thin or brittle, or loss of the nail. Steroids are often given to lessen these side effects. . Weakness that interferes with your daily activities. . This drug contains alcohol and may affect your central nervous system. The central nervous system is made up of your brain and spinal cord. You may feel drunk during and after your treatment and it can impair your ability to drive or use machinery for one to two hours after infusion.  Possible Side Effects (Less Common) . Skin and tissue irritation may involve redness, pain, warmth, or swelling at the IV site. This happens if the drug leaks out of the vein and into nearby tissue. . Changes in your liver function. Your doctor will check your liver function as needed. . Muscle and joint pain. . Effects on the heart: This drug can weaken the heart and lower heart function. Your heart function will be checked as needed. You may have trouble catching your breath, mainly during activities. You may also have trouble breathing while lying down, and have swelling in your ankles. . This drug may cause an increased risk of developing a second cancer. . Skin and tissue irritation may involve redness, pain,  warmth, or swelling at the IV site. This happens if the drug leaks out of the vein and into nearby tissue. Marland Kitchen Blurred vision or other changes in eyesight. . A rare risk of death is increased in people with liver problems. Do not take this drug if you have liver disease and talk to your doctor.  Allergic Reactions Serious allergic reactions, including anaphylaxis are rare. While you are getting this drug in your vein (IV), tell your nurse right away if you have any of these symptoms of an allergic  reaction: . Trouble catching your breath . Feeling like your tongue or throat are swelling . Feeling your heart beat quickly or in a not normal way (palpitations) . Feeling dizzy or lightheaded . Flushing, itching, rash, and/or hives  Infusion Reactions While you are getting this drug in your vein (IV), you may have a reaction. Your nurse will check you closely for these signs: fever or shaking chills, flushing, facial swelling, feeling dizzy, headache, trouble breathing, rash, itching, chest tightness, or chest pain. Less serious reactions to this drug may also happen. You will be given medicines to help stop or lessen these symptoms. Your vital signs will be checked during the infusion. Tell your doctor or nurse right away if you have any of these symptoms at any time during the infusion and/or for the first 24 hours after getting this drug: . Fever, chills, or shaking chills . Feeling dizzy or lightheaded . Headache . Nausea or throwing up  Treating Side Effects . Drink 6-8 cups of fluids every day unless your doctor has told you to limit your fluid intake due to some other health problem. A cup is 8 ounces of fluid. If you throw up or have loose bowel movements, you should drink more fluids so that you do not become dehydrated (lack water in the body due to losing too much fluid). . Talk with your nurse about getting a wig before you lose your hair. Also, call the [facility] at 800-ACS-2345 to find out information about the " Look Good,Feel Better" program close to where you live. It is a free program where women undergoing chemotherapy can learn about wigs, turbans and scarves as well as makeup techniques and skin and nail care. . Do not put anything on a rash unless your doctor or nurse says you may. Keep the area around the rash clean and dry. Ask your doctor for medicine if your rash bothers you. . Mouth care is very important. Your mouth care should consist of routine, gentle  cleaning of your teeth or dentures and rinsing your mouth with a mixture of 1/2 teaspoon of salt in 8 ounces of water or  teaspoon of baking soda in 8 ounces of water. This should be done at least after each meal and at bedtime. . If you have mouth sores, avoid mouthwash that has alcohol. Avoid alcohol and smoking because they can bother your mouth and throat. . Ask your doctor or nurse about medicine to stop or lessen loose bowel movements, nausea, throwing up, and joint and muscle pain. . If you have numbness and tingling in your hands and feet, be careful when cooking, walking, and handling sharp objects and hot liquids.  Food and Drug Interactions There are no known interactions of docetaxel with food. This drug may interact with other medicines. Tell your doctor and pharmacist about all the medicines and dietary supplements (vitamins, minerals, herbs and others) that you are taking at this time. The  safety and use of dietary supplements and alternative diets are often not known. Using these might affect your cancer or interfere with your treatment. Until more is known, you should not use dietary supplements or alternative diets without your cancer doctor's help.  When to Call the Doctor Call your doctor or nurse right away if you have any of these symptoms: . Fever of 100.5 F (38 C) or above . Chills . Easy bruising or bleeding . Wheezing or trouble breathing . Rash or itching . Feeling dizzy or lightheaded . Loose bowel movements (diarrhea) more than 4 times a day or diarrhea with weakness or feeling lightheaded . Nausea that stops you from eating or drinking . Throwing up more than 3 times a day . Signs of liver problems: dark urine, pale bowel movements, bad stomach pain, feeling very tired and weak, unusual itching, or yellowing of the eyes or skin . Eye irritation, blurred vision or other changes in eyesight Call your doctor or nurse as soon as possible if any of these symptoms  happen: . Decreased urine . Pain in your mouth or throat that makes it hard to eat or drink . Nausea that is not relieved by prescribed medicines . Rash that is not relieved by prescribed medicines . Numbness, tingling, decreased feeling or weakness in fingers, toes, arms, or legs . Trouble walking or changes in the way you walk, feeling clumsy when buttoning clothes, opening jars, or other routine hand motions . Swelling of legs, ankles, or feet . Weight gain of 5 pounds in one week (fluid retention) . Fatigue that interferes with your daily activities . Headache that does not go away . Extreme weakness that interferes with normal activities . While you are getting this drug, please tell your nurse right away if you have any pain, redness, or swelling at the site of the IV infusion . Symptoms of being drunk, confusion, or being very sleepy  Sexual Problems and Reproductive Concerns . Pregnancy warning: This drug may have harmful effects on the unborn child, so effective methods of birth control should be used during your cancer treatment. Genetic counseling is available for you to talk about the effects of this drug therapy on future pregnancies. Also, a genetic counselor can look at the possible risk of problems in the unborn baby due to this medicine if an exposure happens during pregnancy. . Breast feeding warning: It is not known if this drug passes into breast milk. For this reason, women should talk to their doctor about the risks and benefits of breast feeding during treatment with this drug because this drug may enter the breast milk and badly harm a breast feeding baby.   Herceptin - this is given to patients who overexpress HER2. Side Effects that may occur during infusion include: chills, fever, headache, dizziness, shortness of breath, low blood pressure, rash. This does not generally happen. We will have to perform MUGA scans or 2D echoes periodically during treatment however  because a side effect of this drug can be cardiotoxicity. The first infusion of Herceptin takes 90 minutes and thereafter 30 minutes.   Perjeta  - is a HER2-targeted therapy that is designed to search for and attack HER2. This may, in both direct and indirect ways, prevent HER2-overexpressing breast cancer cells from growing. PERJETA works with Herceptin, another HER2-targeted therapy, to fight cancer cells that have too much HER2. Healthy cells also have HER2, and can be affected by HER2-targeted therapies, which may cause serious side effects. Perjeta  can cause reduced heart function or congestive heart failure. We will routinely perform 2D Echoes of your heart to make sure your heart muscle is not weakening.  Most common side effects: feeling tired, nausea, vomiting, low white blood cell count, muscle aches    EDUCATIONAL MATERIALS GIVEN AND REVIEWED: Chemotherapy and you book given, information on taxotere, carboplatin, herceptin, and perjeta    SELF CARE ACTIVITIES WHILE ON CHEMOTHERAPY: Hydration Increase your fluid intake 48 hours prior to treatment and drink at least 8 to 12 cups (64 ounces) of water/decaff beverages per day after treatment. You can still have your cup of coffee or soda but these beverages do not count as part of your 8 to 12 cups that you need to drink daily. No alcohol intake.  Medications Continue taking your normal prescription medication as prescribed.  If you start any new herbal or new supplements please let us know first to make sure it is safe.  Mouth Care Have teeth cleaned professionally before starting treatment. Keep dentures and partial plates clean. Use soft toothbrush and do not use mouthwashes that contain alcohol. Biotene is a good mouthwash that is available at most pharmacies or may be ordered by calling [phone]. Use warm salt water gargles (1 teaspoon salt per 1 quart warm water) before and after meals and at bedtime. Or you may rinse with 2  tablespoons of three-percent hydrogen peroxide mixed in eight ounces of water. If you are still having problems with your mouth or sores in your mouth please call the clinic. If you need dental work, please let Dr. Whitney Muse know before you go for your appointment so that we can coordinate the best possible time for you in regards to your chemo regimen. You need to also let your dentist know that you are actively taking chemo. We may need to do labs prior to your dental appointment.   Skin Care Always use sunscreen that has not expired and with SPF (Sun Protection Factor) of 50 or higher. Wear hats to protect your head from the sun. Remember to use sunscreen on your hands, ears, face, & feet.  Use good moisturizing lotions such as udder cream, eucerin, or even Vaseline. Some chemotherapies can cause dry skin, color changes in your skin and nails.    . Avoid long, hot showers or baths. . Use gentle, fragrance-free soaps and laundry detergent. . Use moisturizers, preferably creams or ointments rather than lotions because the thicker consistency is better at preventing skin dehydration. Apply the cream or ointment within 15 minutes of showering. Reapply moisturizer at night, and moisturize your hands every time after you wash them.  Hair Loss (if your doctor says your hair will fall out)  . If your doctor says that your hair is likely to fall out, decide before you begin chemo whether you want to wear a wig. You may want to shop before treatment to match your hair color. . Hats, turbans, and scarves can also camouflage hair loss, although some people prefer to leave their heads uncovered. If you go bare-headed outdoors, be sure to use sunscreen on your scalp. . Cut your hair short. It eases the inconvenience of shedding lots of hair, but it also can reduce the emotional impact of watching your hair fall out. . Don't perm or color your hair during chemotherapy. Those chemical treatments are already  damaging to hair and can enhance hair loss. Once your chemo treatments are done and your hair has grown back, it's OK to  resume dyeing or perming hair. With chemotherapy, hair loss is almost always temporary. But when it grows back, it may be a different color or texture. In older adults who still had hair color before chemotherapy, the new growth may be completely gray.  Often, new hair is very fine and soft.  Infection Prevention Please wash your hands for at least 30 seconds using warm soapy water. Handwashing is the #1 way to prevent the spread of germs. Stay away from sick people or people who are getting over a cold. If you develop respiratory systems such as green/yellow mucus production or productive cough or persistent cough let us know and we will see if you need an antibiotic. It is a good idea to keep a pair of gloves on when going into grocery stores/[employer] to decrease your risk of coming into contact with germs on the carts, etc. Carry alcohol hand gel with you at all times and use it frequently if out in public. If your temperature reaches 100.5 or higher please call the clinic and let us know.  If it is after hours or on the weekend please go to the ER if your temperature is over 100.5.  Please have your own personal thermometer at home to use.    Sex and bodily fluids If you are going to have sex, a condom must be used to protect the person that isn't taking chemotherapy. Chemo can decrease your libido (sex drive). For a few days after chemotherapy, chemotherapy can be excreted through your bodily fluids.  When using the toilet please close the lid and flush the toilet twice.  Do this for a few day after you have had chemotherapy.     Effects of chemotherapy on your sex life Some changes are simple and won't last long. They won't affect your sex life permanently. Sometimes you may feel: . too tired . not strong enough to be very active . sick or sore  . not in the mood . anxious  or low Your anxiety might not seem related to sex. For example, you may be worried about the cancer and how your treatment is going. Or you may be worried about money, or about how you family are coping with your illness. These things can cause stress, which can affect your interest in sex. It's important to talk to your partner about how you feel. Remember - the changes to your sex life don't usually last long. There's usually no medical reason to stop having sex during chemo. The drugs won't have any long term physical effects on your performance or enjoyment of sex. Cancer can't be passed on to your partner during sex  Contraception It's important to use reliable contraception during treatment. Avoid getting pregnant while you or your partner are having chemotherapy. This is because the drugs may harm the baby. Sometimes chemotherapy drugs can leave a man or woman infertile.  This means you would not be able to have children in the future. You might want to talk to someone about permanent infertility. It can be very difficult to learn that you may no longer be able to have children. Some people find counselling helpful. There might be ways to preserve your fertility, although this is easier for men than for women. You may want to speak to a fertility expert. You can talk about sperm banking or harvesting your eggs. You can also ask about other fertility options, such as donor eggs. If you have or have had breast cancer, your  doctor might advise you not to take the contraceptive pill. This is because the hormones in it might affect the cancer.  It is not known for sure whether or not chemotherapy drugs can be passed on through semen or secretions from the vagina. Because of this some doctors advise people to use a barrier method if you have sex during treatment. This applies to vaginal, anal or oral sex. Generally, doctors advise a barrier method only for the time you are actually having the treatment  and for about a week after your treatment. Advice like this can be worrying, but this does not mean that you have to avoid being intimate with your partner. You can still have close contact with your partner and continue to enjoy sex.  Animals If you have cats or birds we just ask that you not change the litter or change the cage.  Please have someone else do this for you while you are on chemotherapy.   Food Safety During and After Cancer Treatment Food safety is important for people both during and after cancer treatment. Cancer and cancer treatments, such as chemotherapy, radiation therapy, and stem cell/bone marrow transplantation, often weaken the immune system. This makes it harder for your body to protect itself from foodborne illness, also called food poisoning. Foodborne illness is caused by eating food that contains harmful bacteria, parasites, or viruses.  Foods to avoid Some foods have a higher risk of becoming tainted with bacteria. These include: Marland Kitchen Unwashed fresh fruit and vegetables, especially leafy vegetables that can hide dirt and other contaminants . Raw sprouts, such as alfalfa sprouts . Raw or undercooked beef, especially ground beef, or other raw or undercooked meat and poultry . Fatty, fried, or spicy foods immediately before or after treatment.  These can sit heavy on your stomach and make you feel nauseous. . Raw or undercooked shellfish, such as oysters. . Sushi and sashimi, which often contain raw fish.  . Unpasteurized beverages, such as unpasteurized fruit juices, raw milk, raw yogurt, or cider . Undercooked eggs, such as soft boiled, over easy, and poached; raw, unpasteurized eggs; or foods made with raw egg, such as homemade raw cookie dough and homemade mayonnaise Simple steps for food safety Shop smart. . Do not buy food stored or displayed in an unclean area. . Do not buy bruised or damaged fruits or vegetables. . Do not buy cans that have cracks, dents, or  bulges. . Pick up foods that can spoil at the end of your shopping trip and store them in a cooler on the way home. Prepare and clean up foods carefully. . Rinse all fresh fruits and vegetables under running water, and dry them with a clean towel or paper towel. . Clean the top of cans before opening them. . After preparing food, wash your hands for 20 seconds with hot water and soap. Pay special attention to areas between fingers and under nails. . Clean your utensils and dishes with hot water and soap. Marland Kitchen Disinfect your kitchen and cutting boards using 1 teaspoon of liquid, unscented bleach mixed into 1 quart of water.   Dispose of old food. . Eat canned and packaged food before its expiration date (the "use by" or "best before" date). . Consume refrigerated leftovers within 3 to 4 days. After that time, throw out the food. Even if the food does not smell or look spoiled, it still may be unsafe. Some bacteria, such as Listeria, can grow even on foods stored in the  refrigerator if they are kept for too long. Take precautions when eating out. . At restaurants, avoid buffets and salad bars where food sits out for a long time and comes in contact with many people. Food can become contaminated when someone with a virus, often a norovirus, or another "bug" handles it. . Put any leftover food in a "to-go" container yourself, rather than having the server do it. And, refrigerate leftovers as soon as you get home. . Choose restaurants that are clean and that are willing to prepare your food as you order it cooked.    MEDICATIONS: Dexamethasone 73m tablet. The day before, day of, and day after chemo take 2 tabs in the am and 2 tabs in the pm. Take with food.                                                                                                                                                              Zofran/Ondansetron 853mtablet. Take 1 tablet every 8 hours as needed for nausea/vomiting. (#1  nausea med to take, this can constipate)  Compazine/Prochlorperazine 1012mablet. Take 1 tablet every 6 hours as needed for nausea/vomiting. (#2 nausea med to take, this can make you sleepy)   EMLA cream. Apply a quarter size amount to port site 1 hour prior to chemo. Do not rub in. Cover with plastic wrap.   Over-the-Counter Meds:  Miralax 1 capful in 8 oz of fluid daily. May increase to two times a day if needed. This is a stool softener. If this doesn't work proceed you can add:  Senokot S-start with 1 tablet two times a day and increase to 4 tablets two times a day if needed. (total of 8 tablets in a 24 hour period). This is a stimulant laxative.   Call us [place] this does not help your bowels move.   Imodium 2mg67mpsule. Take 2 capsules after the 1st loose stool and then 1 capsule every 2 hours until you go a total of 12 hours without having a loose stool. Call the [facility] stools continue. If diarrhea occurs @ bedtime, take 2 capsules @ bedtime. Then take 2 capsules every 4 hours until morning. Call [facility]  Diarrhea Sheet  If you are having loose stools/diarrhea, please purchase Imodium and begin taking as outlined:  At the first sign of poorly formed or loose stools you should begin taking Imodium(loperamide) 2 mg capsules.  Take two caplets (4mg)77mllowed by one caplet (2mg) 26mry 2 hours until you have had no diarrhea for 12 hours.  During the night take two caplets (4mg) a92medtime and continue every 4 hours during the night until the morning.  Stop taking Imodium only after there is no sign of diarrhea for 12  hours.    Always call the [facility] if you are having loose stools/diarrhea that you can't get under control.  Loose stools/disrrhea leads to dehydration (loss of water) in your body.  We have other options of trying to get the loose stools/diarrhea to stopped but you must let us know!     Constipation Sheet *Miralax in 8 oz of fluid daily.  May  increase to two times a day if needed.  This is a stool softener.  If this not enough to keep your bowel regular:  You can add:  *Senokot S, start with one tablet twice a day and can increase to 4 tablets twice a day if needed.  This is a stimulant laxative.   Sometimes when you take pain medication you need BOTH a medicine to keep your stool soft and a medicine to help your bowel push it out!  Please call if the above does not work for you.   Do not go more than 2 days without a bowel movement.  It is very important that you do not become constipated.  It will make you feel sick to your stomach (nausea) and can cause abdominal pain and vomiting.     Nausea Sheet  Zofran/Ondansetron 2m tablet. Take 1 tablet every 8 hours as needed for nausea/vomiting. (#1 nausea med to take, this can constipate)  Compazine/Prochlorperazine 110mtablet. Take 1 tablet every 6 hours as needed for nausea/vomiting. (#2 nausea med to take, this can make you sleepy)  You can take these medications together or separately.  We would first like for you to try the Ondansetron by itself and then take the Prochloperizine if needed. But you are allowed to take both medications at the same time if your nausea is that severe.  If you are having persistent nausea (nausea that does not stop) please take these medications on a staggered schedule so that the nausea medication stays in your body.  Please call the [facility] let [facility] the amount of nausea that you are experiencing.  If you begin to vomit, you need to call the [facility] if it is the weekend and you have vomited more than one time and cant get it to stop-go to the Emergency Room.  Persistent nausea/vomiting can lead to dehydration (loss of fluid in your body) and will make you feel terrible.   Ice chips, sips of clear liquids, foods that are @ room temperature, crackers, and toast tend to be better tolerated.    SYMPTOMS TO REPORT AS SOON AS  POSSIBLE AFTER TREATMENT:  FEVER GREATER THAN 100.5 F  CHILLS WITH OR WITHOUT FEVER  NAUSEA AND VOMITING THAT IS NOT CONTROLLED WITH YOUR NAUSEA MEDICATION  UNUSUAL SHORTNESS OF BREATH  UNUSUAL BRUISING OR BLEEDING  TENDERNESS IN MOUTH AND THROAT WITH OR WITHOUT PRESENCE OF ULCERS  URINARY PROBLEMS  BOWEL PROBLEMS  UNUSUAL RASH    Wear comfortable clothing and clothing appropriate for easy access to any Portacath or PICC line. Let usKoreanow if there is anything that we can do to make your therapy better!     What to do if you need assistance after hours or on the weekends: CALL [phone] HOLD on the line, do not hang up.  You will hear multiple messages but at the end you will be connected with a nurse triage line.  They will contact Dr PeWhitney Musef necessary.  Most of the time they will be able to assist you.  Do not call the hospital operator.  Dr Whitney Muse will not answer phone calls received by them.      I have been informed and understand all of the instructions given to me and have received a copy. I have been instructed to call the clinic [phone] or my family physician as soon as possible for continued medical care, if indicated. I do not have any more questions at this time but understand that I may call the [facility] or the Patient Navigator at [phone] during office hours should I have questions or need assistance in obtaining follow-up care.

## 2016-03-02 ENCOUNTER — Other Ambulatory Visit (HOSPITAL_COMMUNITY): Payer: Self-pay

## 2016-03-06 ENCOUNTER — Encounter (HOSPITAL_COMMUNITY): Payer: Self-pay | Admitting: Hematology & Oncology

## 2016-03-06 ENCOUNTER — Encounter (HOSPITAL_BASED_OUTPATIENT_CLINIC_OR_DEPARTMENT_OTHER): Payer: PRIVATE HEALTH INSURANCE

## 2016-03-06 ENCOUNTER — Encounter (HOSPITAL_COMMUNITY): Payer: PRIVATE HEALTH INSURANCE

## 2016-03-06 VITALS — BP 136/69 | HR 68 | Temp 97.6°F | Resp 20 | Wt 154.0 lb

## 2016-03-06 DIAGNOSIS — Z5112 Encounter for antineoplastic immunotherapy: Secondary | ICD-10-CM | POA: Diagnosis not present

## 2016-03-06 DIAGNOSIS — C50912 Malignant neoplasm of unspecified site of left female breast: Secondary | ICD-10-CM | POA: Diagnosis not present

## 2016-03-06 DIAGNOSIS — C50919 Malignant neoplasm of unspecified site of unspecified female breast: Secondary | ICD-10-CM | POA: Diagnosis not present

## 2016-03-06 DIAGNOSIS — C7951 Secondary malignant neoplasm of bone: Secondary | ICD-10-CM

## 2016-03-06 DIAGNOSIS — Z5111 Encounter for antineoplastic chemotherapy: Secondary | ICD-10-CM

## 2016-03-06 DIAGNOSIS — C773 Secondary and unspecified malignant neoplasm of axilla and upper limb lymph nodes: Secondary | ICD-10-CM

## 2016-03-06 LAB — COMPREHENSIVE METABOLIC PANEL
ALBUMIN: 3.7 g/dL (ref 3.5–5.0)
ALT: 18 U/L (ref 14–54)
ANION GAP: 11 (ref 5–15)
AST: 41 U/L (ref 15–41)
Alkaline Phosphatase: 91 U/L (ref 38–126)
BILIRUBIN TOTAL: 0.4 mg/dL (ref 0.3–1.2)
BUN: 20 mg/dL (ref 6–20)
CO2: 27 mmol/L (ref 22–32)
Calcium: 9.6 mg/dL (ref 8.9–10.3)
Chloride: 100 mmol/L — ABNORMAL LOW (ref 101–111)
Creatinine, Ser: 0.67 mg/dL (ref 0.44–1.00)
GFR calc non Af Amer: >60 mL/min (ref >60)
GLUCOSE: 134 mg/dL — AB (ref 65–99)
Potassium: 3.8 mmol/L (ref 3.5–5.1)
SODIUM: 138 mmol/L (ref 135–145)
TOTAL PROTEIN: 7 g/dL (ref 6.5–8.1)

## 2016-03-06 LAB — CBC WITH DIFFERENTIAL/PLATELET
BASOS PCT: 0 %
Basophils Absolute: 0 10*3/uL (ref 0.0–0.1)
EOS ABS: 0 10*3/uL (ref 0.0–0.7)
Eosinophils Relative: 0 %
HCT: 38.1 % (ref 36.0–46.0)
Hemoglobin: 12.6 g/dL (ref 12.0–15.0)
Lymphocytes Relative: 3 %
Lymphs Abs: 0.2 10*3/uL — ABNORMAL LOW (ref 0.7–4.0)
MCH: 31.3 pg (ref 26.0–34.0)
MCHC: 33.1 g/dL (ref 30.0–36.0)
MCV: 94.5 fL (ref 78.0–100.0)
MONO ABS: 0.6 10*3/uL (ref 0.1–1.0)
MONOS PCT: 9 %
Neutro Abs: 6.3 10*3/uL (ref 1.7–7.7)
Neutrophils Relative %: 88 %
Platelets: 213 10*3/uL (ref 150–400)
RBC: 4.03 MIL/uL (ref 3.87–5.11)
RDW: 16.1 % — ABNORMAL HIGH (ref 11.5–15.5)
WBC: 7.2 10*3/uL (ref 4.0–10.5)

## 2016-03-06 MED ORDER — ACETAMINOPHEN 325 MG PO TABS
650.0000 mg | ORAL_TABLET | Freq: Once | ORAL | Status: AC
  Administered 2016-03-06: 650 mg via ORAL

## 2016-03-06 MED ORDER — PEGFILGRASTIM 6 MG/0.6ML ~~LOC~~ PSKT
6.0000 mg | PREFILLED_SYRINGE | Freq: Once | SUBCUTANEOUS | Status: AC
  Administered 2016-03-06: 6 mg via SUBCUTANEOUS
  Filled 2016-03-06: qty 0.6

## 2016-03-06 MED ORDER — SODIUM CHLORIDE 0.9 % IV SOLN
Freq: Once | INTRAVENOUS | Status: AC
  Administered 2016-03-06: 11:00:00 via INTRAVENOUS

## 2016-03-06 MED ORDER — DEXTROSE 5 % IV SOLN
75.0000 mg/m2 | Freq: Once | INTRAVENOUS | Status: AC
  Administered 2016-03-06: 130 mg via INTRAVENOUS
  Filled 2016-03-06: qty 13

## 2016-03-06 MED ORDER — DIPHENHYDRAMINE HCL 25 MG PO CAPS
ORAL_CAPSULE | ORAL | Status: AC
  Filled 2016-03-06: qty 1

## 2016-03-06 MED ORDER — DIPHENHYDRAMINE HCL 25 MG PO CAPS
50.0000 mg | ORAL_CAPSULE | Freq: Once | ORAL | Status: AC
  Administered 2016-03-06: 50 mg via ORAL

## 2016-03-06 MED ORDER — HEPARIN SOD (PORK) LOCK FLUSH 100 UNIT/ML IV SOLN
500.0000 [IU] | Freq: Once | INTRAVENOUS | Status: AC | PRN
  Administered 2016-03-06: 500 [IU]
  Filled 2016-03-06 (×2): qty 5

## 2016-03-06 MED ORDER — SODIUM CHLORIDE 0.9 % IV SOLN
840.0000 mg | Freq: Once | INTRAVENOUS | Status: AC
  Administered 2016-03-06: 840 mg via INTRAVENOUS
  Filled 2016-03-06: qty 28

## 2016-03-06 MED ORDER — SODIUM CHLORIDE 0.9% FLUSH
10.0000 mL | INTRAVENOUS | Status: DC | PRN
  Administered 2016-03-06: 10 mL
  Filled 2016-03-06: qty 10

## 2016-03-06 MED ORDER — DEXAMETHASONE SODIUM PHOSPHATE 10 MG/ML IJ SOLN
10.0000 mg | Freq: Once | INTRAMUSCULAR | Status: AC
  Administered 2016-03-06: 10 mg via INTRAVENOUS
  Filled 2016-03-06: qty 1

## 2016-03-06 MED ORDER — ACETAMINOPHEN 325 MG PO TABS
ORAL_TABLET | ORAL | Status: AC
  Filled 2016-03-06: qty 2

## 2016-03-06 MED ORDER — DENOSUMAB 120 MG/1.7ML ~~LOC~~ SOLN
120.0000 mg | Freq: Once | SUBCUTANEOUS | Status: AC
  Administered 2016-03-06: 120 mg via SUBCUTANEOUS
  Filled 2016-03-06: qty 1.7

## 2016-03-06 MED ORDER — SODIUM CHLORIDE 0.9 % IV SOLN
8.0000 mg/kg | Freq: Once | INTRAVENOUS | Status: AC
  Administered 2016-03-06: 567 mg via INTRAVENOUS
  Filled 2016-03-06: qty 27

## 2016-03-06 NOTE — Progress Notes (Signed)
CEAIRA TAMBURRO tolerated chemo tx well without complaints or incident. Information for new chemo medications and Xgeva and Neulasta on-pro  Reviewed and permit signed by Shellia Carwin RN All handouts given to pt as well.Pt instructed to continue taking Calcium 1200 mg PO daily. Labs and Echo reviewed prior to administering chemotherapy.VSS upon discharge. Pt discharged self ambulatory using cane in satisfactory condition accompanied by a friend.

## 2016-03-06 NOTE — Patient Instructions (Addendum)
[facility] Discharge Instructions for Patients Receiving Chemotherapy   Beginning January 23rd 2017 lab work for the [facility] will be done in the  Main lab at [facility] on 1st floor. If you have a lab appointment with the [facility] please come in thru the  Main Entrance and check in at the main information desk   Today you received the following chemotherapy agents Herceptin,Perjeta and Taxotere as well as Neulasta on-pro and Xgeva injection. Follow-up as scheduled. Call clinic for any questions or concerns  To help prevent nausea and vomiting after your treatment, we encourage you to take your nausea medication   If you develop nausea and vomiting, or diarrhea that is not controlled by your medication, call the clinic.  The clinic phone number is [phone]. Office hours are Monday-Friday 8:30am-5:00pm.  BELOW ARE SYMPTOMS THAT SHOULD BE REPORTED IMMEDIATELY:  *FEVER GREATER THAN 101.0 F  *CHILLS WITH OR WITHOUT FEVER  NAUSEA AND VOMITING THAT IS NOT CONTROLLED WITH YOUR NAUSEA MEDICATION  *UNUSUAL SHORTNESS OF BREATH  *UNUSUAL BRUISING OR BLEEDING  TENDERNESS IN MOUTH AND THROAT WITH OR WITHOUT PRESENCE OF ULCERS  *URINARY PROBLEMS  *BOWEL PROBLEMS  UNUSUAL RASH Items with * indicate a potential emergency and should be followed up as soon as possible. If you have an emergency after office hours please contact your primary care physician or go to the nearest emergency department.  Please call the clinic during office hours if you have any questions or concerns.   You may also contact the Patient Navigator at [phone] should you have any questions or need assistance in obtaining follow up care.      Resources For Cancer Patients and their Caregivers ? American Cancer Society: Can assist with transportation, wigs, general needs, runs Look Good Feel Better.        (519) 571-8156 ? Cancer Care: Provides financial assistance, online  support groups, medication/co-pay assistance.  1-800-813-HOPE 856-775-0380) ? [place] Assists [place] cancer patients and their families through emotional , educational and financial support.  920 321 1201 ? [place] DSS Where to apply for food stamps, Medicaid and utility assistance. 412-811-4458 ? [facility]: Transportation to medical appointments. 928 337 3637 ? Social Security Administration: May apply for disability if have a Stage IV cancer. 479-624-2983 7825567347 ? [employer], Disability and Transit Services: Assists with nutrition, care and transit needs. (973)343-1419

## 2016-03-06 NOTE — Progress Notes (Signed)
Chemotherapy teaching completed.  Consent signed.  Extensive teaching packet given.   

## 2016-03-07 ENCOUNTER — Telehealth (HOSPITAL_COMMUNITY): Payer: Self-pay

## 2016-03-07 NOTE — Telephone Encounter (Signed)
See telephone encounter note.

## 2016-03-11 ENCOUNTER — Other Ambulatory Visit: Payer: Self-pay

## 2016-03-11 ENCOUNTER — Encounter (HOSPITAL_COMMUNITY): Payer: Self-pay | Admitting: Emergency Medicine

## 2016-03-11 ENCOUNTER — Inpatient Hospital Stay (HOSPITAL_COMMUNITY)
Admission: EM | Admit: 2016-03-11 | Discharge: 2016-03-15 | DRG: 809 | Disposition: A | Discharge status: Home / Self Care | Payer: PRIVATE HEALTH INSURANCE | Attending: Internal Medicine | Admitting: Internal Medicine

## 2016-03-11 ENCOUNTER — Emergency Department (HOSPITAL_COMMUNITY): Payer: PRIVATE HEALTH INSURANCE

## 2016-03-11 DIAGNOSIS — R0602 Shortness of breath: Secondary | ICD-10-CM

## 2016-03-11 DIAGNOSIS — Z79899 Other long term (current) drug therapy: Secondary | ICD-10-CM | POA: Diagnosis not present

## 2016-03-11 DIAGNOSIS — C787 Secondary malignant neoplasm of liver and intrahepatic bile duct: Secondary | ICD-10-CM | POA: Diagnosis present

## 2016-03-11 DIAGNOSIS — K219 Gastro-esophageal reflux disease without esophagitis: Secondary | ICD-10-CM | POA: Diagnosis present

## 2016-03-11 DIAGNOSIS — Z923 Personal history of irradiation: Secondary | ICD-10-CM

## 2016-03-11 DIAGNOSIS — Z8249 Family history of ischemic heart disease and other diseases of the circulatory system: Secondary | ICD-10-CM | POA: Diagnosis not present

## 2016-03-11 DIAGNOSIS — D709 Neutropenia, unspecified: Secondary | ICD-10-CM | POA: Diagnosis present

## 2016-03-11 DIAGNOSIS — C50912 Malignant neoplasm of unspecified site of left female breast: Secondary | ICD-10-CM | POA: Diagnosis present

## 2016-03-11 DIAGNOSIS — Z9221 Personal history of antineoplastic chemotherapy: Secondary | ICD-10-CM

## 2016-03-11 DIAGNOSIS — J449 Chronic obstructive pulmonary disease, unspecified: Secondary | ICD-10-CM | POA: Diagnosis present

## 2016-03-11 DIAGNOSIS — Z801 Family history of malignant neoplasm of trachea, bronchus and lung: Secondary | ICD-10-CM

## 2016-03-11 DIAGNOSIS — Z803 Family history of malignant neoplasm of breast: Secondary | ICD-10-CM | POA: Diagnosis not present

## 2016-03-11 DIAGNOSIS — Z825 Family history of asthma and other chronic lower respiratory diseases: Secondary | ICD-10-CM

## 2016-03-11 DIAGNOSIS — R0902 Hypoxemia: Secondary | ICD-10-CM | POA: Diagnosis not present

## 2016-03-11 DIAGNOSIS — R630 Anorexia: Secondary | ICD-10-CM | POA: Diagnosis present

## 2016-03-11 DIAGNOSIS — J029 Acute pharyngitis, unspecified: Secondary | ICD-10-CM | POA: Diagnosis present

## 2016-03-11 DIAGNOSIS — Z170 Estrogen receptor positive status [ER+]: Secondary | ICD-10-CM

## 2016-03-11 DIAGNOSIS — Z87891 Personal history of nicotine dependence: Secondary | ICD-10-CM

## 2016-03-11 DIAGNOSIS — Z9013 Acquired absence of bilateral breasts and nipples: Secondary | ICD-10-CM | POA: Diagnosis not present

## 2016-03-11 DIAGNOSIS — E785 Hyperlipidemia, unspecified: Secondary | ICD-10-CM | POA: Diagnosis present

## 2016-03-11 DIAGNOSIS — Z171 Estrogen receptor negative status [ER-]: Secondary | ICD-10-CM

## 2016-03-11 DIAGNOSIS — F411 Generalized anxiety disorder: Secondary | ICD-10-CM | POA: Diagnosis present

## 2016-03-11 DIAGNOSIS — R5081 Fever presenting with conditions classified elsewhere: Secondary | ICD-10-CM | POA: Diagnosis present

## 2016-03-11 DIAGNOSIS — J439 Emphysema, unspecified: Secondary | ICD-10-CM | POA: Diagnosis not present

## 2016-03-11 DIAGNOSIS — C50911 Malignant neoplasm of unspecified site of right female breast: Secondary | ICD-10-CM | POA: Diagnosis present

## 2016-03-11 DIAGNOSIS — C7951 Secondary malignant neoplasm of bone: Secondary | ICD-10-CM | POA: Diagnosis present

## 2016-03-11 DIAGNOSIS — I10 Essential (primary) hypertension: Secondary | ICD-10-CM | POA: Diagnosis present

## 2016-03-11 DIAGNOSIS — E784 Other hyperlipidemia: Secondary | ICD-10-CM | POA: Diagnosis not present

## 2016-03-11 DIAGNOSIS — C773 Secondary and unspecified malignant neoplasm of axilla and upper limb lymph nodes: Secondary | ICD-10-CM | POA: Diagnosis not present

## 2016-03-11 DIAGNOSIS — D701 Agranulocytosis secondary to cancer chemotherapy: Principal | ICD-10-CM | POA: Diagnosis present

## 2016-03-11 DIAGNOSIS — Z8614 Personal history of Methicillin resistant Staphylococcus aureus infection: Secondary | ICD-10-CM | POA: Diagnosis not present

## 2016-03-11 DIAGNOSIS — Z6826 Body mass index (BMI) 26.0-26.9, adult: Secondary | ICD-10-CM

## 2016-03-11 LAB — COMPREHENSIVE METABOLIC PANEL
ALK PHOS: 70 U/L (ref 38–126)
ALT: 20 U/L (ref 14–54)
AST: 28 U/L (ref 15–41)
Albumin: 3.1 g/dL — ABNORMAL LOW (ref 3.5–5.0)
Anion gap: 7 (ref 5–15)
BUN: 13 mg/dL (ref 6–20)
CALCIUM: 7.6 mg/dL — AB (ref 8.9–10.3)
CO2: 25 mmol/L (ref 22–32)
CREATININE: 0.5 mg/dL (ref 0.44–1.00)
Chloride: 105 mmol/L (ref 101–111)
GFR calc non Af Amer: >60 mL/min (ref >60)
Glucose, Bld: 99 mg/dL (ref 65–99)
Potassium: 3.8 mmol/L (ref 3.5–5.1)
SODIUM: 137 mmol/L (ref 135–145)
Total Bilirubin: 0.4 mg/dL (ref 0.3–1.2)
Total Protein: 6.1 g/dL — ABNORMAL LOW (ref 6.5–8.1)

## 2016-03-11 LAB — URINALYSIS, ROUTINE W REFLEX MICROSCOPIC
BACTERIA UA: NONE SEEN
Bilirubin Urine: NEGATIVE
Glucose, UA: NEGATIVE mg/dL
HGB URINE DIPSTICK: NEGATIVE
Ketones, ur: NEGATIVE mg/dL
Leukocytes, UA: NEGATIVE
Nitrite: NEGATIVE
PROTEIN: 30 mg/dL — AB
SPECIFIC GRAVITY, URINE: 1.019 (ref 1.005–1.030)
pH: 8 (ref 5.0–8.0)

## 2016-03-11 LAB — CBC WITH DIFFERENTIAL/PLATELET
BASOS ABS: 0 10*3/uL (ref 0.0–0.1)
Basophils Relative: 2 %
EOS PCT: 2 %
Eosinophils Absolute: 0 10*3/uL (ref 0.0–0.7)
HEMATOCRIT: 36.2 % (ref 36.0–46.0)
Hemoglobin: 11.9 g/dL — ABNORMAL LOW (ref 12.0–15.0)
LYMPHS ABS: 0.4 10*3/uL — AB (ref 0.7–4.0)
LYMPHS PCT: 34 %
MCH: 31.2 pg (ref 26.0–34.0)
MCHC: 32.9 g/dL (ref 30.0–36.0)
MCV: 94.8 fL (ref 78.0–100.0)
Monocytes Absolute: 0.3 10*3/uL (ref 0.1–1.0)
Monocytes Relative: 24 %
Neutro Abs: 0.5 10*3/uL — ABNORMAL LOW (ref 1.7–7.7)
Neutrophils Relative %: 38 %
PLATELETS: 195 10*3/uL (ref 150–400)
RBC: 3.82 MIL/uL — AB (ref 3.87–5.11)
RDW: 17 % — ABNORMAL HIGH (ref 11.5–15.5)
WBC: 1.3 10*3/uL — AB (ref 4.0–10.5)

## 2016-03-11 LAB — INFLUENZA PANEL BY PCR (TYPE A & B)
INFLBPCR: NEGATIVE
Influenza A By PCR: NEGATIVE

## 2016-03-11 LAB — TROPONIN I: Troponin I: <0.03 ng/mL (ref <0.03)

## 2016-03-11 LAB — LACTIC ACID, PLASMA: Lactic Acid, Venous: 0.8 mmol/L (ref 0.5–1.9)

## 2016-03-11 LAB — LIPASE, BLOOD: LIPASE: 13 U/L (ref 11–51)

## 2016-03-11 MED ORDER — DEXTROSE 5 % IV SOLN
2.0000 g | Freq: Once | INTRAVENOUS | Status: AC
  Administered 2016-03-11: 2 g via INTRAVENOUS
  Filled 2016-03-11: qty 2

## 2016-03-11 MED ORDER — SODIUM CHLORIDE 0.9 % IV SOLN
INTRAVENOUS | Status: DC
  Administered 2016-03-11: 21:00:00 via INTRAVENOUS

## 2016-03-11 NOTE — ED Provider Notes (Signed)
[facility] DEPT Provider Note   CSN: [id number] Arrival date & time: 03/11/16  1846     History   Chief Complaint Chief Complaint  Patient presents with  . Fever    HPI Sheila Garrison is a 61 y.o. female.   Fever    Pt was seen at 1920. Per pt, c/o gradual onset and persistence of constant home fevers to "100.5" that began this afternoon. Has been associated with nausea, loose stools, and generalized fatigue. Pt's LD chemo 5 days ago for dx breast CA. Pt denies any change in her usual chronic SOB and sore throat. Denies rash, no vomiting, no black or blood in stools, no abd pain, no CP/palpitations, no cough, no back pain.   Past Medical History:  Diagnosis Date  . Anxiety   . Bone metastases ([place]) 02/15/2016  . Breast CA ([place]) 07/01/2010   rt breast ca  . COPD (chronic obstructive pulmonary disease) ([place])   . Family history of breast cancer    Aunt on father's side  . Hyperlipemia   . Hypertension   . MRSA (methicillin resistant staph aureus) culture positive    in breast  . Neuropathy ([place])    bilateral toes  . Nipple discharge    with pain, infection, lump  . S/P radiation therapy 01/08/11 - 02/22/11   Right Chest Wall/ 50 Gy / 25 Fractions, Right [place]/ 50 gy/25 Fractions, Right Axillary Boost/500 cGy/25 Fractions, Right Chest Wall Boost/10 Gy/5 Fractions  . Stage III (T4N3) R Br cancer  06/21/2010   Letrozole 2.5 mg which she started on 03/09/2011.  This patient presented to my office on Jun 21, 2010 with a large ulcerated right breast mass and bleeding. She underwent additional workup at that time and a biopsy was done showing invasive breast cancer. She was staged and was found to have a T4  disease that was ER, PR, HER-2 positive. CA 2729 was normal at 31. She is undergoing chemotherapy wi  . Status post chemotherapy    6 cycles of carboplatin and docetaxel with trastuzumab every 21 days followed by surgery  . Tobacco abuse 06/22/2015  . Use of letrozole  ([place]) 03/09/2011  . Wears dentures     Patient Active Problem List   Diagnosis Date Noted  . Goals of care, counseling/discussion 02/29/2016  . Bone metastases ([place]) 02/15/2016  . Elevated troponin   . Altered mental status 12/18/2015  . 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 Tobacco abuse 06/22/2015  . Vitamin D deficiency 04/16/2014  . GAD (generalized anxiety disorder) 04/16/2014  . GERD (gastroesophageal reflux disease) 04/16/2014  . Osteopenia 11/18/2013  . Lymphedema of arm 10/14/2012  . COPD with acute exacerbation ([place]) 10/09/2011  . Shortness of breath 03/11/2011  . Hypertension 09/06/2010  . Hyperlipidemia 09/06/2010  . Stage III (T4N3) R Br cancer  06/21/2010    Past Surgical History:  Procedure Laterality Date  . BREAST SURGERY  2012  . DILATION AND CURETTAGE OF UTERUS    . MASTECTOMY MODIFIED RADICAL Left 12/16/2015   Procedure: MASTECTOMY MODIFIED RADICAL;  Surgeon: Aviva Signs, MD;  Location: [facility];  Service: General;  Laterality: Left;  . port a catheter insertion    . PORT-A-CATH REMOVAL Left 01/15/2013   Procedure: REMOVAL PORT-A-CATH;  Surgeon: Scherry Ran, MD;  Location: [facility] ORS;  Service: General;  Laterality: Left;  . PORTACATH PLACEMENT Right 01/16/2016   Procedure: INSERTION PORT-A-CATH;  Surgeon: Aviva Signs, MD;  Location:  [facility];  Service: General;  Laterality: Right;  . TUBAL LIGATION      OB History    No data available       Home Medications    Prior to Admission medications   Medication Sig Start Date End Date Taking? Authorizing Provider  albuterol (PROVENTIL HFA;VENTOLIN HFA) 108 (90 Base) MCG/ACT inhaler Inhale 2 puffs into the lungs every 6 (six) hours as needed for wheezing or shortness of breath.    Historical Provider, MD  Cholecalciferol (VITAMIN D) 2000 units tablet Take 2,000 Units by mouth 2 (two) times daily.    Historical Provider, MD  clobetasol cream (TEMOVATE) 2.68 % Apply 1  application topically as needed. rash Patient taking differently: Apply 1 application topically daily as needed (rash). rash 10/07/15   Terald Sleeper, PA-C  dexamethasone (DECADRON) 4 MG tablet Take the day before, day of, and the day after chemotherapy.  Take 2 tablets in the am and 2 tablets in the pm.  Take with food. 03/01/16   Patrici Ranks, MD  DOCEtaxel (TAXOTERE IV) Inject into the vein.    Historical Provider, MD  fexofenadine (ALLEGRA) 180 MG tablet Take 180 mg by mouth daily.      Historical Provider, MD  furosemide (LASIX) 40 MG tablet Take 1 tablet (40 mg total) by mouth daily. 01/18/16   [name], FNP  Ginkgo Biloba 120 MG TABS Take 120 mg by mouth daily.    Historical Provider, MD  lidocaine-prilocaine (EMLA) cream Apply a quarter size amount to affected area 1 hour prior to coming to chemotherapy. 03/01/16   Patrici Ranks, MD  LORazepam (ATIVAN) 0.5 MG tablet TAKE ONE TABLET BY MOUTH EVERY 8 HOURS AS NEEDED Patient taking differently: TAKE ONE TABLET BY MOUTH EVERY 8 HOURS AS NEEDED FOR ANXIETY 11/16/15   Terald Sleeper, PA-C  losartan (COZAAR) 100 MG tablet Take 1 tablet (100 mg total) by mouth daily. 01/18/16   [name], FNP  Multiple Vitamin (MULTIVITAMIN) capsule Take 1 capsule by mouth daily.      Historical Provider, MD  Multiple Vitamins-Minerals (EMERGEN-C IMMUNE PO) Take 1 tablet by mouth daily.    Historical Provider, MD  naproxen sodium (ANAPROX) 220 MG tablet Take 220 mg by mouth 2 (two) times daily as needed (pain). pain    Historical Provider, MD  omeprazole (PRILOSEC OTC) 20 MG tablet Take 40 mg by mouth daily.    Historical Provider, MD  ondansetron (ZOFRAN ODT) 4 MG disintegrating tablet Take 1 tablet (4 mg total) by mouth every 8 (eight) hours as needed for nausea or vomiting. 12/18/15   Aviva Signs, MD  ondansetron (ZOFRAN) 8 MG tablet Take 1 tablet (8 mg total) by mouth every 8 (eight) hours as needed for nausea or vomiting. 03/01/16   Patrici Ranks, MD  Pertuzumab (PERJETA IV) Inject into the vein.    Historical Provider, MD  potassium chloride SA (K-DUR,KLOR-CON) 20 MEQ tablet Take 1 tablet (20 mEq total) by mouth 2 (two) times daily. 10/07/15 10/06/16  Terald Sleeper, PA-C  prochlorperazine (COMPAZINE) 10 MG tablet Take 1 tablet (10 mg total) by mouth every 6 (six) hours as needed for nausea or vomiting. 03/01/16   Patrici Ranks, MD  rosuvastatin (CRESTOR) 20 MG tablet Take 1 tablet (20 mg total) by mouth daily. 10/07/15   Terald Sleeper, PA-C  Trastuzumab (HERCEPTIN IV) Inject into the vein.    Historical Provider, MD    Family History  Family History  Problem Relation Age of Onset  . COPD Mother   . Diabetes Father   . Hypertension Father   . COPD Brother   . Cancer Maternal Aunt     ovarian - died of old age  . Cancer Paternal Aunt     had breast ca; died of of a different cancer  . Cancer Brother     Social History Social History  Substance Use Topics  . Smoking status: Former Smoker    Packs/day: 0.25    Years: 30.00    Types: Cigarettes    Quit date: 12/15/2015  . Smokeless tobacco: Never Used  . Alcohol use No     Allergies   Patient has no known allergies.   Review of Systems Review of Systems  Constitutional: Positive for fever.  ROS: Statement: All systems negative except as marked or noted in the HPI; Constitutional: +fever and chills, generalized weakness/fatigue.. ; ; Eyes: Negative for eye pain, redness and discharge. ; ; ENMT: Negative for ear pain, hoarseness, nasal congestion, sinus pressure and +chronic sore throat. ; ; Cardiovascular: Negative for chest pain, palpitations, diaphoresis, and peripheral edema. ; ; Respiratory: +chronic SOB. Negative for cough, wheezing and stridor. ; ; Gastrointestinal: +nausea, diarrhea. Negative for vomiting, abdominal pain, blood in stool, hematemesis, jaundice and rectal bleeding. . ; ; Genitourinary: Negative for dysuria, flank pain and hematuria. ; ;  Musculoskeletal: Negative for back pain and neck pain. Negative for swelling and trauma.; ; Skin: Negative for pruritus, rash, abrasions, blisters, bruising and skin lesion.; ; Neuro: Negative for headache, lightheadedness and neck stiffness. Negative for altered level of consciousness, altered mental status, extremity weakness, paresthesias, involuntary movement, seizure and syncope.       Physical Exam Updated Vital Signs BP 121/79 (BP Location: Right Wrist)   Pulse 96   Temp 99.6 F (37.6 C) (Oral)   Resp 16   Ht 5' 4"  (1.626 m)   Wt 154 lb (69.9 kg)   LMP 03/08/2010   SpO2 95%   BMI 26.43 kg/m   Physical Exam 1925: Physical examination:  Nursing notes reviewed; Vital signs and O2 SAT reviewed;  Constitutional: Well developed, Well nourished, Well hydrated, In no acute distress. Appears fatigued.; Head:  Normocephalic, atraumatic; Eyes: EOMI, PERRL, No scleral icterus; ENMT: Mouth and pharynx normal, Mucous membranes moist; Neck: Supple, Full range of motion, No lymphadenopathy; Cardiovascular: Regular rate and rhythm, No gallop; Respiratory: Breath sounds diminished & equal bilaterally, No wheezes.  Speaking full sentences with ease, Normal respiratory effort/excursion; Chest: Nontender, Movement normal; Abdomen: Soft, Nontender, Nondistended, Normal bowel sounds; Genitourinary: No CVA tenderness; Extremities: Pulses normal, No tenderness, No edema, No calf edema or asymmetry.; Neuro: AA&Ox3, Major CN grossly intact.  Speech clear. No gross focal motor or sensory deficits in extremities.; Skin: Color normal, Warm, Dry.   ED Treatments / Results  Labs (all labs ordered are listed, but only abnormal results are displayed)   EKG  EKG Interpretation  Date/Time:  Sunday March 11 2016 20:06:12 EST Ventricular Rate:  94 PR Interval:    QRS Duration: 87 QT Interval:  352 QTC Calculation: 441 R Axis:   97 Text Interpretation:  Sinus rhythm Right axis deviation When compared with  ECG of 02/15/2016 Nonspecific T wave abnormality is no longer Present Confirmed by [facility]  MD, Nunzio Cory [id number]) on 03/11/2016 8:11:35 PM       Radiology   Procedures Procedures (including critical care time)  Medications Ordered in ED Medications  0.9 %  sodium chloride infusion (not administered)     Initial Impression / Assessment and Plan / ED Course  I have reviewed the triage vital signs and the nursing notes.  Pertinent labs & imaging results that were available during my care of the patient were reviewed by me and considered in my medical decision making (see chart for details).  MDM Reviewed: previous chart, nursing note and vitals Reviewed previous: labs and ECG Interpretation: labs, ECG and x-ray   Results for orders placed or performed during the hospital encounter of 03/11/16  CBC with Differential  Result Value Ref Range   WBC 1.3 (LL) 4.0 - 10.5 K/uL   RBC 3.82 (L) 3.87 - 5.11 MIL/uL   Hemoglobin 11.9 (L) 12.0 - 15.0 g/dL   HCT 36.2 36.0 - 46.0 %   MCV 94.8 78.0 - 100.0 fL   MCH 31.2 26.0 - 34.0 pg   MCHC 32.9 30.0 - 36.0 g/dL   RDW 17.0 (H) 11.5 - 15.5 %   Platelets 195 150 - 400 K/uL   Neutrophils Relative % 38 %   Neutro Abs 0.5 (L) 1.7 - 7.7 K/uL   Lymphocytes Relative 34 %   Lymphs Abs 0.4 (L) 0.7 - 4.0 K/uL   Monocytes Relative 24 %   Monocytes Absolute 0.3 0.1 - 1.0 K/uL   Eosinophils Relative 2 %   Eosinophils Absolute 0.0 0.0 - 0.7 K/uL   Basophils Relative 2 %   Basophils Absolute 0.0 0.0 - 0.1 K/uL  Comprehensive metabolic panel  Result Value Ref Range   Sodium 137 135 - 145 mmol/L   Potassium 3.8 3.5 - 5.1 mmol/L   Chloride 105 101 - 111 mmol/L   CO2 25 22 - 32 mmol/L   Glucose, Bld 99 65 - 99 mg/dL   BUN 13 6 - 20 mg/dL   Creatinine, Ser 0.50 0.44 - 1.00 mg/dL   Calcium 7.6 (L) 8.9 - 10.3 mg/dL   Total Protein 6.1 (L) 6.5 - 8.1 g/dL   Albumin 3.1 (L) 3.5 - 5.0 g/dL   AST 28 15 - 41 U/L   ALT 20 14 - 54 U/L   Alkaline Phosphatase  70 38 - 126 U/L   Total Bilirubin 0.4 0.3 - 1.2 mg/dL   GFR calc non Af Amer >60 >60 mL/min   GFR calc Af Amer >60 >60 mL/min   Anion gap 7 5 - 15  Lactic acid, plasma  Result Value Ref Range   Lactic Acid, Venous 0.8 0.5 - 1.9 mmol/L  Urinalysis, Routine w reflex microscopic  Result Value Ref Range   Color, Urine YELLOW YELLOW   APPearance CLEAR CLEAR   Specific Gravity, Urine 1.019 1.005 - 1.030   pH 8.0 5.0 - 8.0   Glucose, UA NEGATIVE NEGATIVE mg/dL   Hgb urine dipstick NEGATIVE NEGATIVE   Bilirubin Urine NEGATIVE NEGATIVE   Ketones, ur NEGATIVE NEGATIVE mg/dL   Protein, ur 30 (A) NEGATIVE mg/dL   Nitrite NEGATIVE NEGATIVE   Leukocytes, UA NEGATIVE NEGATIVE   RBC / HPF 0-5 0 - 5 RBC/hpf   WBC, UA 0-5 0 - 5 WBC/hpf   Bacteria, UA NONE SEEN NONE SEEN   Mucous PRESENT   Lipase, blood  Result Value Ref Range   Lipase 13 11 - 51 U/L   Dg Chest 2 View Result Date: 03/11/2016 CLINICAL DATA:  Fevers EXAM: CHEST  2 VIEW COMPARISON:  01/16/2016, 02/20/2016 FINDINGS: Right chest wall port is again noted. Cardiac shadow is stable. Mild interstitial  changes are again seen and stable. Lungs are hyperinflated consistent with COPD. Postsurgical changes are noted in the axilla bilaterally consistent the given clinical history. Some fullness is noted in right hilar region which was not present on the prior exam and may be projectional in nature. Mild density is noted in the right upper lobe secondary to pleural disease anteriorly on the right. Stable sclerosis of the T8 vertebra is noted consistent with bony metastatic disease. IMPRESSION: COPD. Bony metastatic disease. Electronically Signed   By: Inez Catalina M.D.   On: 03/11/2016 19:50    2140:  [place] 494 with home fever to 100.5. Flu test, BC and UC pending. Will start IV abx. Dx and testing d/w pt and family.  Questions answered.  Verb understanding, agreeable to admit.  T/C to [facility] Dr. Olevia Bowens, case discussed, including:  HPI, pertinent PM/SHx,  VS/PE, dx testing, ED course and treatment:  Agreeable to admit, requests he will come to the ED for evaluation.    Final Clinical Impressions(s) / ED Diagnoses   Final diagnoses:  None    New Prescriptions New Prescriptions   No medications on file      Francine Graven, DO 03/14/16 [id number]

## 2016-03-11 NOTE — H&P (Signed)
History and Physical    Sheila Garrison YIA:[id number] DOB: 08/27/1955 DOA: 03/11/2016  PCP: [name], FNP   Patient coming from: Home.  Chief Complaint: Fever.  HPI: Sheila Garrison is a 61 y.o. female with medical history significant of anxiety, breast cancer with bone metastasis, COPD, hyperlipidemia, hypertension, history of MRSA positive culture, bilateral toes neuropathy, history of tobacco abuse who is coming to the emergency department with complaints of fever of 100.5 degrees Fahrenheit earlier today. She states that her last chemotherapy treatment was 5 days ago on Tuesday. She denies fever until today. She denies headaches, earaches, sore throat, rhinorrhea, cough, chest pain, abdominal pain, emesis, melena or hematochezia. She complains of nausea, decreased appetite and mildly loose stools. She denies dysuria or frequency.  ED Course: Her workup in the emergency department shows EKG normal sinus rhythm, chest radiograph no acute infiltrate, UA shows no signs of infection, WBC 1.3, hemoglobin 11.9 g/dL and platelets 195. She has a normal lactic acid at 0.8, sodium 137, potassium 3.8, chloride 105, bicarbonate 25 mmol/L. Her troponin and lipase levels are within normal range. Influenza by PCR screening was negative and her chest radiograph shows COPD, but no acute cardiopulmonary pathology.  Review of Systems: As per HPI otherwise 10 point review of systems negative.    Past Medical History:  Diagnosis Date  . Anxiety   . Bone metastases ([place]) 02/15/2016  . Breast CA ([place]) 07/01/2010   rt breast ca  . COPD (chronic obstructive pulmonary disease) ([place])   . Family history of breast cancer    Aunt on father's side  . Hyperlipemia   . Hypertension   . MRSA (methicillin resistant staph aureus) culture positive    in breast  . Neuropathy ([place])    bilateral toes  . Nipple discharge    with pain, infection, lump  . S/P radiation therapy 01/08/11 - 02/22/11   Right Chest Wall/ 50  Gy / 25 Fractions, Right [place]/ 50 gy/25 Fractions, Right Axillary Boost/500 cGy/25 Fractions, Right Chest Wall Boost/10 Gy/5 Fractions  . Stage III (T4N3) R Br cancer  06/21/2010   Letrozole 2.5 mg which she started on 03/09/2011.  This patient presented to my office on Jun 21, 2010 with a large ulcerated right breast mass and bleeding. She underwent additional workup at that time and a biopsy was done showing invasive breast cancer. She was staged and was found to have a T4  disease that was ER, PR, HER-2 positive. CA 2729 was normal at 31. She is undergoing chemotherapy wi  . Status post chemotherapy    6 cycles of carboplatin and docetaxel with trastuzumab every 21 days followed by surgery  . Tobacco abuse 06/22/2015  . Use of letrozole (Femara) 03/09/2011  . Wears dentures     Past Surgical History:  Procedure Laterality Date  . BREAST SURGERY  2012  . DILATION AND CURETTAGE OF UTERUS    . MASTECTOMY MODIFIED RADICAL Left 12/16/2015   Procedure: MASTECTOMY MODIFIED RADICAL;  Surgeon: Aviva Signs, MD;  Location: [facility];  Service: General;  Laterality: Left;  . port a catheter insertion    . PORT-A-CATH REMOVAL Left 01/15/2013   Procedure: REMOVAL PORT-A-CATH;  Surgeon: Scherry Ran, MD;  Location: [facility];  Service: General;  Laterality: Left;  . PORTACATH PLACEMENT Right 01/16/2016   Procedure: INSERTION PORT-A-CATH;  Surgeon: Aviva Signs, MD;  Location: [facility];  Service: General;  Laterality: Right;  . TUBAL LIGATION  reports that she quit smoking about 2 months ago. Her smoking use included Cigarettes. She has a 7.50 pack-year smoking history. She has never used smokeless tobacco. She reports that she does not drink alcohol or use drugs.  No Known Allergies  Family History  Problem Relation Age of Onset  . COPD Mother   . Diabetes Father   . Hypertension Father   . COPD Brother   . Cancer Maternal Aunt     ovarian - died of old age  . Cancer Paternal Aunt      had breast ca; died of of a different cancer  . Cancer Brother     Prior to Admission medications   Medication Sig Start Date End Date Taking? Authorizing Provider  albuterol (PROVENTIL HFA;VENTOLIN HFA) 108 (90 Base) MCG/ACT inhaler Inhale 2 puffs into the lungs every 6 (six) hours as needed for wheezing or shortness of breath.   Yes Historical Provider, MD  Cholecalciferol (VITAMIN D) 2000 units tablet Take 2,000 Units by mouth 2 (two) times daily.   Yes Historical Provider, MD  dexamethasone (DECADRON) 4 MG tablet Take the day before, day of, and the day after chemotherapy.  Take 2 tablets in the am and 2 tablets in the pm.  Take with food. 03/01/16  Yes Patrici Ranks, MD  DOCEtaxel (TAXOTERE IV) Inject into the vein every 21 ( twenty-one) days.    Yes Historical Provider, MD  fexofenadine (ALLEGRA) 180 MG tablet Take 180 mg by mouth daily.     Yes Historical Provider, MD  furosemide (LASIX) 40 MG tablet Take 1 tablet (40 mg total) by mouth daily. 01/18/16  Yes [name], FNP  Ginkgo Biloba 120 MG TABS Take 120 mg by mouth daily.   Yes Historical Provider, MD  lidocaine-prilocaine (EMLA) cream Apply a quarter size amount to affected area 1 hour prior to coming to chemotherapy. 03/01/16  Yes Patrici Ranks, MD  LORazepam (ATIVAN) 0.5 MG tablet TAKE ONE TABLET BY MOUTH EVERY 8 HOURS AS NEEDED Patient taking differently: TAKE ONE TABLET BY MOUTH EVERY 8 HOURS AS NEEDED FOR ANXIETY 11/16/15  Yes Terald Sleeper, PA-C  losartan (COZAAR) 100 MG tablet Take 1 tablet (100 mg total) by mouth daily. 01/18/16  Yes [name], FNP  Multiple Vitamin (MULTIVITAMIN) capsule Take 1 capsule by mouth daily.     Yes Historical Provider, MD  Multiple Vitamins-Minerals (EMERGEN-C IMMUNE PO) Take 1 tablet by mouth daily.   Yes Historical Provider, MD  naproxen sodium (ANAPROX) 220 MG tablet Take 220 mg by mouth 2 (two) times daily as needed (pain). pain   Yes Historical Provider, MD  omeprazole  (PRILOSEC OTC) 20 MG tablet Take 40 mg by mouth daily.   Yes Historical Provider, MD  ondansetron (ZOFRAN ODT) 4 MG disintegrating tablet Take 1 tablet (4 mg total) by mouth every 8 (eight) hours as needed for nausea or vomiting. 12/18/15  Yes Aviva Signs, MD  ondansetron (ZOFRAN) 8 MG tablet Take 1 tablet (8 mg total) by mouth every 8 (eight) hours as needed for nausea or vomiting. 03/01/16  Yes Patrici Ranks, MD  Pertuzumab (PERJETA IV) Inject into the vein every 21 ( twenty-one) days.    Yes Historical Provider, MD  potassium chloride SA (K-DUR,KLOR-CON) 20 MEQ tablet Take 1 tablet (20 mEq total) by mouth 2 (two) times daily. 10/07/15 10/06/16 Yes Terald Sleeper, PA-C  prochlorperazine (COMPAZINE) 10 MG tablet Take 1 tablet (10 mg total) by mouth every 6 (  six) hours as needed for nausea or vomiting. 03/01/16  Yes Patrici Ranks, MD  rosuvastatin (CRESTOR) 20 MG tablet Take 1 tablet (20 mg total) by mouth daily. 10/07/15  Yes Terald Sleeper, PA-C  Trastuzumab (HERCEPTIN IV) Inject into the vein every 21 ( twenty-one) days.    Yes Historical Provider, MD    Physical Exam:                                                    Constitutional: NAD, calm, comfortable Vitals:   03/11/16 2000 03/11/16 2008 03/11/16 2128 03/11/16 2157  BP: 127/77   110/69  Pulse: 96 93 94 91  Resp:  _0 Temp:      TempSrc:      SpO2: 95% 96% 95% 94%  Weight:      Height:       Eyes: PERRL, lids and conjunctivae normal ENMT: Mucous membranes and lips are mildly dry. Mucosal erythema .Posterior pharynx shows mild erythema. Dentures. Neck: Normal, supple, no masses, no thyromegaly Respiratory: Clear to auscultation bilaterally, no wheezing, no crackles. Normal respiratory effort. No accessory muscle use.  Cardiovascular: Regular rate and rhythm, no murmurs / rubs / gallops. No extremity edema. 2+ pedal pulses. No carotid bruits.  Chest: B/L mastectomy, right port-cath in place. Abdomen: Soft, no tenderness, no  masses palpated. No hepatosplenomegaly. Bowel sounds positive.  Musculoskeletal: no clubbing / cyanosis. No joint deformity upper and lower extremities. Good ROM, no contractures. Normal muscle tone.  Skin: no rashes, lesions, ulcers on limited skin exam. Neurologic: CN 2-12 grossly intact. Sensation intact, DTR normal. Strength 5/5 in all 4.  Psychiatric: Normal judgment and insight. Alert and oriented x 3. Normal mood.    Labs on Admission: I have personally reviewed following labs and imaging studies  CBC:  Recent Labs Lab 03/06/16 1031 03/11/16 2032  WBC 7.2 1.3*  NEUTROABS 6.3 0.5*  HGB 12.6 11.9*  HCT 38.1 36.2  MCV 94.5 94.8  PLT 213 706   Basic Metabolic Panel:  Recent Labs Lab 03/06/16 1031 03/11/16 2032  NA 138 137  K 3.8 3.8  CL 100* 105  CO2 27 25  GLUCOSE 134* 99  BUN 20 13  CREATININE 0.67 0.50  CALCIUM 9.6 7.6*   GFR: Estimated Creatinine Clearance: 70 mL/min (by C-G formula based on SCr of 0.5 mg/dL). Liver Function Tests:  Recent Labs Lab 03/06/16 1031 03/11/16 2032  AST 41 28  ALT 18 20  ALKPHOS 91 70  BILITOT 0.4 0.4  PROT 7.0 6.1*  ALBUMIN 3.7 3.1*    Recent Labs Lab 03/11/16 2032  LIPASE 13   No results for input(s): AMMONIA in the last 168 hours. Coagulation Profile: No results for input(s): INR, PROTIME in the last 168 hours. Cardiac Enzymes:  Recent Labs Lab 03/11/16 2032  TROPONINI <0.03   BNP (last 3 results) No results for input(s): PROBNP in the last 8760 hours. HbA1C: No results for input(s): HGBA1C in the last 72 hours. CBG: No results for input(s): GLUCAP in the last 168 hours. Lipid Profile: No results for input(s): CHOL, HDL, LDLCALC, TRIG, CHOLHDL, LDLDIRECT in the last 72 hours. Thyroid Function Tests: No results for input(s): TSH, T4TOTAL, FREET4, T3FREE, THYROIDAB in the last 72 hours. Anemia Panel: No results for input(s): VITAMINB12, FOLATE, FERRITIN, TIBC, IRON, RETICCTPCT in  the last 72  hours. Urine analysis:    Component Value Date/Time   COLORURINE YELLOW 03/11/2016 2021   APPEARANCEUR CLEAR 03/11/2016 2021   LABSPEC 1.019 03/11/2016 2021   PHURINE 8.0 03/11/2016 2021   GLUCOSEU NEGATIVE 03/11/2016 2021   HGBUR NEGATIVE 03/11/2016 2021   BILIRUBINUR NEGATIVE 03/11/2016 2021   KETONESUR NEGATIVE 03/11/2016 2021   PROTEINUR 30 (A) 03/11/2016 2021   UROBILINOGEN 0.2 11/20/2010 0910   NITRITE NEGATIVE 03/11/2016 2021   LEUKOCYTESUR NEGATIVE 03/11/2016 2021    Recent Results (from the past 240 hour(s))  Culture, blood (routine x 2)     Status: None (Preliminary result)   Collection Time: 03/11/16  8:32 PM  Result Value Ref Range Status   Specimen Description SITE NOT SPECIFIED  Final   Special Requests BOTTLES DRAWN AEROBIC AND ANAEROBIC [place]  Final   Culture PENDING  Incomplete   Report Status PENDING  Incomplete  Culture, blood (routine x 2)     Status: None (Preliminary result)   Collection Time: 03/11/16  8:43 PM  Result Value Ref Range Status   Specimen Description BLOOD RIGHT HAND  Final   Special Requests BOTTLES DRAWN AEROBIC AND ANAEROBIC 5CC EACH  Final   Culture PENDING  Incomplete   Report Status PENDING  Incomplete     Radiological Exams on Admission: Dg Chest 2 View  Result Date: 03/11/2016 CLINICAL DATA:  Fevers EXAM: CHEST  2 VIEW COMPARISON:  01/16/2016, 02/20/2016 FINDINGS: Right chest wall port is again noted. Cardiac shadow is stable. Mild interstitial changes are again seen and stable. Lungs are hyperinflated consistent with COPD. Postsurgical changes are noted in the axilla bilaterally consistent the given clinical history. Some fullness is noted in right hilar region which was not present on the prior exam and may be projectional in nature. Mild density is noted in the right upper lobe secondary to pleural disease anteriorly on the right. Stable sclerosis of the T8 vertebra is noted consistent with bony metastatic disease. IMPRESSION:  COPD. Bony metastatic disease. Electronically Signed   By: Inez Catalina M.D.   On: 03/11/2016 19:50   12/21/2015 Echocardiogram limited ------------------------------------------------------------------- Indications:      Dyspnea 786.09.  ------------------------------------------------------------------- History:   PMH:   Chronic obstructive pulmonary disease.  Risk factors:  elevated Troponin. Left breast cancer metastasized to axillary lymph with mastectomy of left breast. History of tobacco use. Hypertension. Dyslipidemia.  ------------------------------------------------------------------- Study Conclusions  - Impressions: Limited echo due to recent mastectomy   Subcostal images only   Normal LV size and function EF 60%   Mild RV enlargement   No significant AS/MR   NO ASD   No effusion   IVC normal   Mild TR.  Impressions:  - Limited echo due to recent mastectomy   Subcostal images only   Normal LV size and function EF 60%   Mild RV enlargement   No significant AS/MR   NO ASD   No effusion   IVC normal   Mild TR.   EKG: Independently reviewed. Vent. rate 94 BPM PR interval * ms QRS duration 87 ms QT/QTc 352/441 ms P-R-T axes 79 97 58 Sinus rhythm Right axis deviation  Assessment/Plan Principal Problem:   Febrile neutropenia (HCC) Admit to MedSurg/inpatient. Neutropenic precautions. Continue cefepime every 8 hours. Follow CBC daily. Follow-up blood cultures and sensitivity.  Active Problems:   Hypertension Continue losartan 100 mg by mouth daily. Hold furosemide. Monitor blood pressure.    Hyperlipidemia Continue Crestor 20 mg by mouth  daily. Monitor LFTs and fasting lipids.    GAD (generalized anxiety disorder) Continue lorazepam 0.5 mg by mouth as needed.    GERD (gastroesophageal reflux disease) Continue proton pump inhibitor.    [name], stage 4, left [facility]) Continue oncology follow-up as scheduled.     COPD (chronic obstructive pulmonary disease) (HCC) Continue supplemental oxygen. Continue albuterol MDI as needed.     DVT prophylaxis: Lovenox SQ. Code Status: Full code. Family Communication:  Disposition Plan: Admit for IV antibiotic therapy with cefepime and neutropenic precautions. Consults called:  Admission status: Inpatient/MedSurg.   Reubin Milan MD [facility] Pager [phone].  If 7PM-7AM, please contact night-coverage [email or web address] Password [email or web address]  03/11/2016, 10:22 PM

## 2016-03-11 NOTE — ED Triage Notes (Addendum)
Patient c/o fever and nausea that started this afternoon. Denies any vomiting. Per patient loose stool but no diarrhea. Patient is a cancer patient, last received chemo on Tuesday. Denies any cough. Patient also reports sore throat and shortness of breath but states that this is "normal for her since starting chemo." Patient wears 3 liters of oxygen via [place] at night. Patient's O2 sat 89% on Room Air. Patient told not to take anything for fever by a "triage nurse through [facility]."

## 2016-03-11 NOTE — ED Notes (Signed)
CRITICAL VALUE ALERT  Critical value received:  WBC 1.3  Date of notification:  03/11/2016  Time of notification:  2125  Critical value read back:Yes.    Nurse who received alert:  Fabio Neighbors RN  MD notified (1st page):  Dr. Thurnell Garbe  Time of first page:  2134  MD notified (2nd page):  Time of second page:  Responding MD:    Time MD responded:

## 2016-03-12 ENCOUNTER — Other Ambulatory Visit (HOSPITAL_COMMUNITY): Payer: Self-pay | Admitting: Oncology

## 2016-03-12 ENCOUNTER — Encounter (HOSPITAL_COMMUNITY): Payer: Self-pay

## 2016-03-12 DIAGNOSIS — R109 Unspecified abdominal pain: Secondary | ICD-10-CM

## 2016-03-12 DIAGNOSIS — C50912 Malignant neoplasm of unspecified site of left female breast: Secondary | ICD-10-CM

## 2016-03-12 DIAGNOSIS — R5081 Fever presenting with conditions classified elsewhere: Secondary | ICD-10-CM

## 2016-03-12 DIAGNOSIS — R197 Diarrhea, unspecified: Secondary | ICD-10-CM

## 2016-03-12 DIAGNOSIS — R070 Pain in throat: Secondary | ICD-10-CM

## 2016-03-12 DIAGNOSIS — C773 Secondary and unspecified malignant neoplasm of axilla and upper limb lymph nodes: Secondary | ICD-10-CM

## 2016-03-12 DIAGNOSIS — D709 Neutropenia, unspecified: Secondary | ICD-10-CM

## 2016-03-12 DIAGNOSIS — C7951 Secondary malignant neoplasm of bone: Secondary | ICD-10-CM

## 2016-03-12 LAB — COMPREHENSIVE METABOLIC PANEL
ALK PHOS: 62 U/L (ref 38–126)
ALT: 18 U/L (ref 14–54)
ANION GAP: 7 (ref 5–15)
AST: 25 U/L (ref 15–41)
Albumin: 2.8 g/dL — ABNORMAL LOW (ref 3.5–5.0)
BILIRUBIN TOTAL: 0.6 mg/dL (ref 0.3–1.2)
BUN: 11 mg/dL (ref 6–20)
CALCIUM: 7 mg/dL — AB (ref 8.9–10.3)
CO2: 24 mmol/L (ref 22–32)
Chloride: 106 mmol/L (ref 101–111)
Creatinine, Ser: 0.53 mg/dL (ref 0.44–1.00)
GFR calc non Af Amer: >60 mL/min (ref >60)
GLUCOSE: 100 mg/dL — AB (ref 65–99)
Potassium: 3.8 mmol/L (ref 3.5–5.1)
Sodium: 137 mmol/L (ref 135–145)
TOTAL PROTEIN: 5.6 g/dL — AB (ref 6.5–8.1)

## 2016-03-12 LAB — CBC WITH DIFFERENTIAL/PLATELET
BASOS ABS: 0 10*3/uL (ref 0.0–0.1)
Basophils Relative: 1 %
EOS ABS: 0 10*3/uL (ref 0.0–0.7)
Eosinophils Relative: 0 %
HCT: 33.6 % — ABNORMAL LOW (ref 36.0–46.0)
HEMOGLOBIN: 11.3 g/dL — AB (ref 12.0–15.0)
LYMPHS PCT: 35 %
Lymphs Abs: 0.5 10*3/uL — ABNORMAL LOW (ref 0.7–4.0)
MCH: 32.1 pg (ref 26.0–34.0)
MCHC: 33.6 g/dL (ref 30.0–36.0)
MCV: 95.5 fL (ref 78.0–100.0)
Monocytes Absolute: 0.5 10*3/uL (ref 0.1–1.0)
Monocytes Relative: 34 %
NEUTROS ABS: 0.5 10*3/uL — AB (ref 1.7–7.7)
NEUTROS PCT: 30 %
PLATELETS: 198 10*3/uL (ref 150–400)
RBC: 3.52 MIL/uL — ABNORMAL LOW (ref 3.87–5.11)
RDW: 17.1 % — ABNORMAL HIGH (ref 11.5–15.5)
WBC: 1.5 10*3/uL — ABNORMAL LOW (ref 4.0–10.5)

## 2016-03-12 LAB — MRSA PCR SCREENING: MRSA BY PCR: POSITIVE — AB

## 2016-03-12 LAB — LACTIC ACID, PLASMA: Lactic Acid, Venous: 0.8 mmol/L (ref 0.5–1.9)

## 2016-03-12 MED ORDER — FLUCONAZOLE 100 MG PO TABS
100.0000 mg | ORAL_TABLET | Freq: Every day | ORAL | Status: DC
  Administered 2016-03-12 – 2016-03-15 (×4): 100 mg via ORAL
  Filled 2016-03-12 (×4): qty 1

## 2016-03-12 MED ORDER — ROSUVASTATIN CALCIUM 20 MG PO TABS
20.0000 mg | ORAL_TABLET | Freq: Every day | ORAL | Status: DC
  Administered 2016-03-12 – 2016-03-15 (×4): 20 mg via ORAL
  Filled 2016-03-12 (×4): qty 1

## 2016-03-12 MED ORDER — ONDANSETRON HCL 4 MG PO TABS: 4.0000 mg | ORAL_TABLET | Freq: Four times a day (QID) | ORAL | Status: DC | PRN

## 2016-03-12 MED ORDER — NAPROXEN 250 MG PO TABS: 250.0000 mg | ORAL_TABLET | Freq: Two times a day (BID) | ORAL | Status: DC | PRN

## 2016-03-12 MED ORDER — POTASSIUM CHLORIDE CRYS ER 20 MEQ PO TBCR
20.0000 meq | EXTENDED_RELEASE_TABLET | Freq: Two times a day (BID) | ORAL | Status: DC
  Administered 2016-03-12 – 2016-03-15 (×8): 20 meq via ORAL
  Filled 2016-03-12 (×8): qty 1

## 2016-03-12 MED ORDER — LIDOCAINE VISCOUS 2 % MT SOLN
15.0000 mL | Freq: Four times a day (QID) | OROMUCOSAL | Status: DC | PRN
  Administered 2016-03-12: 15 mL via OROMUCOSAL
  Filled 2016-03-12 (×2): qty 15

## 2016-03-12 MED ORDER — LOSARTAN POTASSIUM 50 MG PO TABS
25.0000 mg | ORAL_TABLET | Freq: Every day | ORAL | Status: DC
  Administered 2016-03-13 – 2016-03-15 (×3): 25 mg via ORAL
  Filled 2016-03-12 (×3): qty 1

## 2016-03-12 MED ORDER — OMEPRAZOLE 20 MG PO CPDR
40.0000 mg | DELAYED_RELEASE_CAPSULE | Freq: Every day | ORAL | Status: DC
  Filled 2016-03-12: qty 2

## 2016-03-12 MED ORDER — PROCHLORPERAZINE MALEATE 5 MG PO TABS: 10.0000 mg | ORAL_TABLET | Freq: Four times a day (QID) | ORAL | Status: DC | PRN

## 2016-03-12 MED ORDER — ONDANSETRON HCL 4 MG PO TABS: 8.0000 mg | ORAL_TABLET | Freq: Three times a day (TID) | ORAL | Status: DC | PRN

## 2016-03-12 MED ORDER — ENSURE ENLIVE PO LIQD
237.0000 mL | Freq: Two times a day (BID) | ORAL | Status: DC
  Administered 2016-03-12 – 2016-03-15 (×5): 237 mL via ORAL

## 2016-03-12 MED ORDER — ENOXAPARIN SODIUM 40 MG/0.4ML ~~LOC~~ SOLN
40.0000 mg | SUBCUTANEOUS | Status: DC
  Administered 2016-03-12 – 2016-03-14 (×4): 40 mg via SUBCUTANEOUS
  Filled 2016-03-12 (×4): qty 0.4

## 2016-03-12 MED ORDER — CHLORHEXIDINE GLUCONATE CLOTH 2 % EX PADS
6.0000 | MEDICATED_PAD | Freq: Every day | CUTANEOUS | Status: DC
  Administered 2016-03-12 – 2016-03-15 (×4): 6 via TOPICAL

## 2016-03-12 MED ORDER — LORAZEPAM 0.5 MG PO TABS
0.5000 mg | ORAL_TABLET | ORAL | Status: DC | PRN
  Administered 2016-03-12 – 2016-03-15 (×8): 0.5 mg via ORAL
  Filled 2016-03-12 (×8): qty 1

## 2016-03-12 MED ORDER — DEXTROSE 5 % IV SOLN
INTRAVENOUS | Status: AC
  Filled 2016-03-12: qty 2

## 2016-03-12 MED ORDER — LORATADINE 10 MG PO TABS
10.0000 mg | ORAL_TABLET | Freq: Every day | ORAL | Status: DC
  Administered 2016-03-12 – 2016-03-15 (×4): 10 mg via ORAL
  Filled 2016-03-12 (×4): qty 1

## 2016-03-12 MED ORDER — SODIUM CHLORIDE 0.9 % IV SOLN
INTRAVENOUS | Status: DC
  Administered 2016-03-12 – 2016-03-13 (×3): via INTRAVENOUS

## 2016-03-12 MED ORDER — ONDANSETRON HCL 4 MG/2ML IJ SOLN: 4.0000 mg | Freq: Four times a day (QID) | INTRAMUSCULAR | Status: DC | PRN

## 2016-03-12 MED ORDER — LOSARTAN POTASSIUM 50 MG PO TABS
100.0000 mg | ORAL_TABLET | Freq: Every day | ORAL | Status: DC
  Administered 2016-03-12: 100 mg via ORAL
  Filled 2016-03-12: qty 2

## 2016-03-12 MED ORDER — ALUM & MAG HYDROXIDE-SIMETH 200-200-20 MG/5ML PO SUSP
30.0000 mL | ORAL | Status: DC | PRN
  Administered 2016-03-12: 30 mL via ORAL
  Filled 2016-03-12: qty 30

## 2016-03-12 MED ORDER — DEXTROSE 5 % IV SOLN
2.0000 g | Freq: Three times a day (TID) | INTRAVENOUS | Status: DC
  Administered 2016-03-12 – 2016-03-15 (×10): 2 g via INTRAVENOUS
  Filled 2016-03-12 (×14): qty 2

## 2016-03-12 MED ORDER — MUPIROCIN 2 % EX OINT
1.0000 "application " | TOPICAL_OINTMENT | Freq: Two times a day (BID) | CUTANEOUS | Status: DC
  Administered 2016-03-12 – 2016-03-15 (×8): 1 via NASAL
  Filled 2016-03-12 (×4): qty 22

## 2016-03-12 MED ORDER — ALBUTEROL SULFATE (2.5 MG/3ML) 0.083% IN NEBU
3.0000 mL | INHALATION_SOLUTION | Freq: Four times a day (QID) | RESPIRATORY_TRACT | Status: DC | PRN
  Administered 2016-03-13: 3 mL via RESPIRATORY_TRACT
  Filled 2016-03-12: qty 3

## 2016-03-12 MED ORDER — MENTHOL 3 MG MT LOZG
1.0000 | LOZENGE | OROMUCOSAL | Status: DC | PRN
  Administered 2016-03-12 (×5): 3 mg via ORAL
  Filled 2016-03-12 (×5): qty 9

## 2016-03-12 MED ORDER — SIMETHICONE 80 MG PO CHEW
160.0000 mg | CHEWABLE_TABLET | Freq: Four times a day (QID) | ORAL | Status: DC | PRN
  Administered 2016-03-12 – 2016-03-15 (×7): 160 mg via ORAL
  Filled 2016-03-12 (×7): qty 2

## 2016-03-12 NOTE — Progress Notes (Signed)
Initial Nutrition Assessment  INTERVENTION:  Ensure Enlive po BID, each supplement provides 350 kcal and 20 grams of protein    NUTRITION DIAGNOSIS:   Increased nutrient needs related to cancer and cancer related treatments as evidenced by estimated needs.  GOAL:   Patient will meet greater than or equal to 90% of their needs   MONITOR:   PO intake, Supplement acceptance, Labs, Weight trends  REASON FOR ASSESSMENT:   Malnutrition Screening Tool    ASSESSMENT: Ms Beaty is a 61 yo female with breast cancer with bone metastasis, COPD and HLD. She presents to ER with fever. Last chemotherapy almost 1 week ago. Her appetite has been very poor and has been taking only bites of her food and eating on ice chips and drinking tea. Her husband brought pancakes for her this morning because she had "a taste" for them but she was unable to more that couple of bites. She denies significant weight change.  She has an Ensure here but has not drank any of it. Patient is willing to supplement nutrition while waiting for her appetite to improve. She is high risk for malnutrition with her increased needs and diminished intake.  Nutrition-Focused physical exam deferred at this time.  Patient did not feel like participating.  Recent Labs Lab 03/06/16 1031 03/11/16 2032 03/12/16 0604  NA 138 137 137  K 3.8 3.8 3.8  CL 100* 105 106  CO2 27 25 24   BUN 20 13 11   CREATININE 0.67 0.50 0.53  CALCIUM 9.6 7.6* 7.0*  GLUCOSE 134* 99 100*   Labs / Meds reviewed   Diet Order:  Diet Heart Room service appropriate? Yes; Fluid consistency: Thin  Skin:  Reviewed, no issues  Last BM:  2/5  Height:   Ht Readings from Last 1 Encounters:  03/11/16 5\' 4"  (1.626 m)    Weight:   Wt Readings from Last 1 Encounters:  03/11/16 153 lb (69.4 kg)    Ideal Body Weight:  54.5 kg  BMI:  Body mass index is 26.26 kg/m.  Estimated Nutritional Needs:   Kcal:  1900-2100   Protein:  83-96 gr  Fluid:   1.9-2.1 liters daily  EDUCATION NEEDS:   Education needs no appropriate at this time  Colman Cater MS,RD,CSG,LDN Office: [phone] Pager: [phone]

## 2016-03-12 NOTE — Consult Note (Signed)
[facility] Consultation Oncology  Name: ROLA LENNON      MRN: [id number]    Location: M468/E321-22  Date: 03/12/2016 Time:7:44 PM   REFERRING PHYSICIAN:  Jani Gravel, MD ([facility])  REASON FOR CONSULT:  Febrile neutropenia   DIAGNOSIS:  Febrile neutropenia  HISTORY OF PRESENT ILLNESS:  Ms. Brockway is a lovely 61 yo white American female with a known history of metastatic, Stage IV, breast cancer.  She recently started her first cycle of palliative chemotherapy consisting of Docetaxel/Herceptin/Perjeta on 03/06/2016 with Neulasta support.    Stage III (T4N3) R Br cancer    06/21/2010 Initial Diagnosis    Stage III (T4N3) R Br cancer       01/04/2011 - 02/22/2011 Radiation Therapy    50 Gy       05/07/2014 Pathology Results    BCI testing- low risk of late recurrence (3.4% between years 5-10), high likelihood of benefit from extended endocrein therapy, and a 67% relative risk reduction when treated with extended endocrine therapy (27% versus 10.5%).       [name], stage 4, left ([place])   11/22/2015 Mammogram    Ultrasound-guided biopsy of the irregular hypoechoic area, with prominent shadowing, in the OUTER left breast at the 1 o'clock axis. 2. Ultrasound-guided biopsy of the irregular mass within the INNER left breast at the 9 o'clock axis. 3. Ultrasound-guided biopsy of 1 of the enlarged and amorphous lymph nodes in the left axilla.      11/22/2015 Imaging    Ultrasound-guided biopsy of the irregular hypoechoic area, with prominent shadowing, in the OUTER left breast at the 1 o'clock axis. 2. Ultrasound-guided biopsy of the irregular mass within the INNER left breast at the 9 o'clock axis. 3. Ultrasound-guided biopsy of 1 of the enlarged and amorphous lymph nodes in the left axilla.      12/16/2015 Surgery    L modified radical mastectomy with Dr. Arnoldo Morale      12/18/2015 - 12/21/2015 Hospital Admission    Admit date:  12/18/2015 Admission diagnosis: altered MS, acute respiratory failure Additional comments: This likely was a combination of COPD exacerbation along with mild acute on chronic diastolic CHF with  echo shows a preserved EF of around 60%, he was treated with IV steroids and Lasix, she is much better but qualifies for home oxygen which will be provided      12/19/2015 Imaging    No evidence of pulmonary embolus. 2. Small right pleural effusion, with associated atelectasis. Peripheral scarring at the anterior right upper lobe. Mild bilateral emphysema noted. 3. Scattered coronary artery calcifications seen. 4. Postoperative change at the left chest wall, reflecting recent mastectomy, with vague collections of postoperative fluid seen. 5. Vague sclerotic change and heterogeneity at vertebral body T8 raises concern for sequelae of metastatic disease      01/23/2016 PET scan    1. Widespread multifocal metastatic disease in the visualized axial and appendicular skeleton. 2. Solitary metastatic lesion in the liver. Hypermetabolic irregular left axillary lymph node adjacent to the left axillary clips. 3. Activity in the left breast related to mastectomy. 4. Low-grade activity along the pleuroparenchymal thickening anteriorly in the right chest is probably related to prior therapy rather than current malignancy. 5. Probable mild compression fracture at T8 associated with the extensive tumor at this level. There is also bony destruction of the right ninth rib posteriorly. 6. Faintly hypermetabolic but small left external iliac lymph nodes merit surveillance  ADDENDUM: The original report  was by Dr. Van Clines. The following addendum is by Dr. Van Clines:  I have one other musculoskeletal focus of hypermetabolic activity to mention. In the right latissimus dorsi muscle, there is a small hypermetabolic focus with maximum SUV of 5.4, measuring 7 mm in diameter on image 89/4,  compatible with a metastatic deposit.        02/02/2016 Imaging    MRI T-spine: 1. Extensive metastatic disease throughout the cervical and thoracic spine with a subtle pathologic compression fracture of T8. Tumor extends through the posterior margin of the T8 vertebra into the spinal canal without spinal cord compression at this time. 2. No other pathologic fractures of the thoracic spine at this time.      02/10/2016 Procedure    US biopsy of liver lesion      02/15/2016 Pathology Results    Liver, needle/core biopsy, right lobe METASTATIC CARCINOMA, CONSISTENT WITH BREAST DUCTAL CARCINOMA.      02/15/2016 - 02/28/2016 Chemotherapy    Letrozole 2.5 mg daily + Kisqali (600 mg) days 1-21 every 28 days       02/21/2016 Pathology Results    Breast Prognostic profile from liver biopsy: HER2 POSITIVE, ER/PR NEGATIVE (0%).      02/27/2016 -  Radiation Therapy    Palliative XRT to T8 pathologic fracture.      02/28/2016 Treatment Plan Change    Due to HER2 positivity and ER/PR negativity from liver biopsy, change to systemic chemotherapy is recommended.      She reported to the ED with fever of 100.5 F.  She denies any previous fevers.  She reports a sore throat and describes it as burning.  She notes fatigue and not feeling well.  She notes two loose stools today.  None previous.  She reports abdominal discomfort of the right side of abdomen.  "These gas pains are terrible."  She notes a stable appetite, but due to her mouth pain, she notes difficulty eating.     PAST MEDICAL HISTORY:   Past Medical History:  Diagnosis Date  . Anxiety   . Bone metastases ([place]) 02/15/2016  . Breast CA ([place]) 07/01/2010   rt breast ca  . COPD (chronic obstructive pulmonary disease) ([place])   . Family history of breast cancer    Aunt on father's side  . Hyperlipemia   . Hypertension   . MRSA (methicillin resistant staph aureus) culture positive    in breast  . Neuropathy ([place])    bilateral  toes  . Nipple discharge    with pain, infection, lump  . S/P radiation therapy 01/08/11 - 02/22/11   Right Chest Wall/ 50 Gy / 25 Fractions, Right [place]/ 50 gy/25 Fractions, Right Axillary Boost/500 cGy/25 Fractions, Right Chest Wall Boost/10 Gy/5 Fractions  . Stage III (T4N3) R Br cancer  06/21/2010   Letrozole 2.5 mg which she started on 03/09/2011.  This patient presented to my office on Jun 21, 2010 with a large ulcerated right breast mass and bleeding. She underwent additional workup at that time and a biopsy was done showing invasive breast cancer. She was staged and was found to have a T4  disease that was ER, PR, HER-2 positive. CA 2729 was normal at 31. She is undergoing chemotherapy wi  . Status post chemotherapy    6 cycles of carboplatin and docetaxel with trastuzumab every 21 days followed by surgery  . Tobacco abuse 06/22/2015  . Use of letrozole ([place]) 03/09/2011  . Wears dentures  ALLERGIES: No Known Allergies    MEDICATIONS: I have reviewed the patient's current medications.     PAST SURGICAL HISTORY Past Surgical History:  Procedure Laterality Date  . BREAST SURGERY  2012  . DILATION AND CURETTAGE OF UTERUS    . MASTECTOMY MODIFIED RADICAL Left 12/16/2015   Procedure: MASTECTOMY MODIFIED RADICAL;  Surgeon: Aviva Signs, MD;  Location: [facility];  Service: General;  Laterality: Left;  . port a catheter insertion    . PORT-A-CATH REMOVAL Left 01/15/2013   Procedure: REMOVAL PORT-A-CATH;  Surgeon: Scherry Ran, MD;  Location: [facility];  Service: General;  Laterality: Left;  . PORTACATH PLACEMENT Right 01/16/2016   Procedure: INSERTION PORT-A-CATH;  Surgeon: Aviva Signs, MD;  Location: [facility];  Service: General;  Laterality: Right;  . TUBAL LIGATION      FAMILY HISTORY: Family History  Problem Relation Age of Onset  . COPD Mother   . Diabetes Father   . Hypertension Father   . COPD Brother   . Cancer Maternal Aunt     ovarian - died of old age  . Cancer  Paternal Aunt     had breast ca; died of of a different cancer  . Lung cancer Brother     SOCIAL HISTORY:  reports that she quit smoking about 2 months ago. Her smoking use included Cigarettes. She has a 7.50 pack-year smoking history. She has never used smokeless tobacco. She reports that she does not drink alcohol or use drugs.  PERFORMANCE STATUS: The patient's performance status is 2 - Symptomatic, <50% confined to bed  PHYSICAL EXAM: Most Recent Vital Signs: Blood pressure 112/66, pulse 90, temperature 99.8 F (37.7 C), temperature source Oral, resp. rate 20, height 5' 4"  (1.626 m), weight 153 lb (69.4 kg), last menstrual period 03/08/2010, SpO2 96 %. General appearance: alert, cooperative, appears stated age and looks ill Head: Normocephalic, without obvious abnormality, atraumatic Eyes: negative findings: lids and lashes normal, conjunctivae and sclerae normal and corneas clear Neck: supple, symmetrical, trachea midline Lungs: clear to auscultation bilaterally Heart: regular rate and rhythm Extremities: extremities normal, atraumatic, no cyanosis or edema Skin: Skin color, texture, turgor normal. No rashes or lesions Neurologic: Grossly normal  Abdomen- hyperactive bowel sounds.  LABORATORY DATA:  Results for orders placed or performed during the hospital encounter of 03/11/16 (from the past 48 hour(s))  Urinalysis, Routine w reflex microscopic     Status: Abnormal   Collection Time: 03/11/16  8:21 PM  Result Value Ref Range   Color, Urine YELLOW YELLOW   APPearance CLEAR CLEAR   Specific Gravity, Urine 1.019 1.005 - 1.030   pH 8.0 5.0 - 8.0   Glucose, UA NEGATIVE NEGATIVE mg/dL   Hgb urine dipstick NEGATIVE NEGATIVE   Bilirubin Urine NEGATIVE NEGATIVE   Ketones, ur NEGATIVE NEGATIVE mg/dL   Protein, ur 30 (A) NEGATIVE mg/dL   Nitrite NEGATIVE NEGATIVE   Leukocytes, UA NEGATIVE NEGATIVE   RBC / HPF 0-5 0 - 5 RBC/hpf   WBC, UA 0-5 0 - 5 WBC/hpf   Bacteria, UA NONE  SEEN NONE SEEN   Mucous PRESENT   CBC with Differential     Status: Abnormal   Collection Time: 03/11/16  8:32 PM  Result Value Ref Range   WBC 1.3 (LL) 4.0 - 10.5 K/uL    Comment: RESULT REPEATED AND VERIFIED CRITICAL RESULT CALLED TO, READ BACK BY AND VERIFIED WITH:  TALBOTT,T @ 2126 ON 03/11/16 BY JUW    RBC 3.82 (L) 3.87 -  5.11 MIL/uL   Hemoglobin 11.9 (L) 12.0 - 15.0 g/dL   HCT 36.2 36.0 - 46.0 %   MCV 94.8 78.0 - 100.0 fL   MCH 31.2 26.0 - 34.0 pg   MCHC 32.9 30.0 - 36.0 g/dL   RDW 17.0 (H) 11.5 - 15.5 %   Platelets 195 150 - 400 K/uL   Neutrophils Relative % 38 %   Neutro Abs 0.5 (L) 1.7 - 7.7 K/uL   Lymphocytes Relative 34 %   Lymphs Abs 0.4 (L) 0.7 - 4.0 K/uL   Monocytes Relative 24 %   Monocytes Absolute 0.3 0.1 - 1.0 K/uL   Eosinophils Relative 2 %   Eosinophils Absolute 0.0 0.0 - 0.7 K/uL   Basophils Relative 2 %   Basophils Absolute 0.0 0.0 - 0.1 K/uL  Comprehensive metabolic panel     Status: Abnormal   Collection Time: 03/11/16  8:32 PM  Result Value Ref Range   Sodium 137 135 - 145 mmol/L   Potassium 3.8 3.5 - 5.1 mmol/L   Chloride 105 101 - 111 mmol/L   CO2 25 22 - 32 mmol/L   Glucose, Bld 99 65 - 99 mg/dL   BUN 13 6 - 20 mg/dL   Creatinine, Ser 0.50 0.44 - 1.00 mg/dL   Calcium 7.6 (L) 8.9 - 10.3 mg/dL   Total Protein 6.1 (L) 6.5 - 8.1 g/dL   Albumin 3.1 (L) 3.5 - 5.0 g/dL   AST 28 15 - 41 U/L   ALT 20 14 - 54 U/L   Alkaline Phosphatase 70 38 - 126 U/L   Total Bilirubin 0.4 0.3 - 1.2 mg/dL   GFR calc non Af Amer >60 >60 mL/min   GFR calc Af Amer >60 >60 mL/min    Comment: (NOTE) The eGFR has been calculated using the CKD EPI equation. This calculation has not been validated in all clinical situations. eGFR's persistently <60 mL/min signify possible Chronic Kidney Disease.    Anion gap 7 5 - 15  Lactic acid, plasma     Status: None   Collection Time: 03/11/16  8:32 PM  Result Value Ref Range   Lactic Acid, Venous 0.8 0.5 - 1.9 mmol/L  Culture,  blood (routine x 2)     Status: None (Preliminary result)   Collection Time: 03/11/16  8:32 PM  Result Value Ref Range   Specimen Description SITE NOT SPECIFIED    Special Requests BOTTLES DRAWN AEROBIC AND ANAEROBIC 6CC EACH    Culture NO GROWTH < 12 HOURS    Report Status PENDING   Lipase, blood     Status: None   Collection Time: 03/11/16  8:32 PM  Result Value Ref Range   Lipase 13 11 - 51 U/L  Troponin I     Status: None   Collection Time: 03/11/16  8:32 PM  Result Value Ref Range   Troponin I <0.03 <0.03 ng/mL  Culture, blood (routine x 2)     Status: None (Preliminary result)   Collection Time: 03/11/16  8:43 PM  Result Value Ref Range   Specimen Description BLOOD RIGHT HAND    Special Requests BOTTLES DRAWN AEROBIC AND ANAEROBIC 5CC EACH    Culture NO GROWTH < 12 HOURS    Report Status PENDING   Influenza panel by PCR (type A & B)     Status: None   Collection Time: 03/11/16  9:05 PM  Result Value Ref Range   Influenza A By PCR NEGATIVE NEGATIVE   Influenza B  By PCR NEGATIVE NEGATIVE    Comment: (NOTE) The Xpert Xpress Flu assay is intended as an aid in the diagnosis of  influenza and should not be used as a sole basis for treatment.  This  assay is FDA approved for nasopharyngeal swab specimens only. Nasal  washings and aspirates are unacceptable for Xpert Xpress Flu testing.   Lactic acid, plasma     Status: None   Collection Time: 03/11/16 11:12 PM  Result Value Ref Range   Lactic Acid, Venous 0.8 0.5 - 1.9 mmol/L  MRSA PCR Screening     Status: Abnormal   Collection Time: 03/12/16 12:10 AM  Result Value Ref Range   MRSA by PCR POSITIVE (A) NEGATIVE    Comment:        The GeneXpert MRSA Assay (FDA approved for NASAL specimens only), is one component of a comprehensive MRSA colonization surveillance program. It is not intended to diagnose MRSA infection nor to guide or monitor treatment for MRSA infections. RESULT CALLED TO, READ BACK BY AND VERIFIED  WITH:  STOCUM,B @ 0228 ON 03/12/16 BY JUW   Comprehensive metabolic panel     Status: Abnormal   Collection Time: 03/12/16  6:04 AM  Result Value Ref Range   Sodium 137 135 - 145 mmol/L   Potassium 3.8 3.5 - 5.1 mmol/L   Chloride 106 101 - 111 mmol/L   CO2 24 22 - 32 mmol/L   Glucose, Bld 100 (H) 65 - 99 mg/dL   BUN 11 6 - 20 mg/dL   Creatinine, Ser 0.53 0.44 - 1.00 mg/dL   Calcium 7.0 (L) 8.9 - 10.3 mg/dL   Total Protein 5.6 (L) 6.5 - 8.1 g/dL   Albumin 2.8 (L) 3.5 - 5.0 g/dL   AST 25 15 - 41 U/L   ALT 18 14 - 54 U/L   Alkaline Phosphatase 62 38 - 126 U/L   Total Bilirubin 0.6 0.3 - 1.2 mg/dL   GFR calc non Af Amer >60 >60 mL/min   GFR calc Af Amer >60 >60 mL/min    Comment: (NOTE) The eGFR has been calculated using the CKD EPI equation. This calculation has not been validated in all clinical situations. eGFR's persistently <60 mL/min signify possible Chronic Kidney Disease.    Anion gap 7 5 - 15  CBC WITH DIFFERENTIAL     Status: Abnormal   Collection Time: 03/12/16  6:04 AM  Result Value Ref Range   WBC 1.5 (L) 4.0 - 10.5 K/uL   RBC 3.52 (L) 3.87 - 5.11 MIL/uL   Hemoglobin 11.3 (L) 12.0 - 15.0 g/dL   HCT 33.6 (L) 36.0 - 46.0 %   MCV 95.5 78.0 - 100.0 fL   MCH 32.1 26.0 - 34.0 pg   MCHC 33.6 30.0 - 36.0 g/dL   RDW 17.1 (H) 11.5 - 15.5 %   Platelets 198 150 - 400 K/uL   Neutrophils Relative % 30 %   Lymphocytes Relative 35 %   Monocytes Relative 34 %   Eosinophils Relative 0 %   Basophils Relative 1 %   Neutro Abs 0.5 (L) 1.7 - 7.7 K/uL   Lymphs Abs 0.5 (L) 0.7 - 4.0 K/uL   Monocytes Absolute 0.5 0.1 - 1.0 K/uL   Eosinophils Absolute 0.0 0.0 - 0.7 K/uL   Basophils Absolute 0.0 0.0 - 0.1 K/uL   WBC Morphology ATYPICAL LYMPHOCYTES       RADIOGRAPHY: Dg Chest 2 View  Result Date: 03/11/2016 CLINICAL DATA:  Fevers EXAM:  CHEST  2 VIEW COMPARISON:  01/16/2016, 02/20/2016 FINDINGS: Right chest wall port is again noted. Cardiac shadow is stable. Mild interstitial  changes are again seen and stable. Lungs are hyperinflated consistent with COPD. Postsurgical changes are noted in the axilla bilaterally consistent the given clinical history. Some fullness is noted in right hilar region which was not present on the prior exam and may be projectional in nature. Mild density is noted in the right upper lobe secondary to pleural disease anteriorly on the right. Stable sclerosis of the T8 vertebra is noted consistent with bony metastatic disease. IMPRESSION: COPD. Bony metastatic disease. Electronically Signed   By: Inez Catalina M.D.   On: 03/11/2016 19:50       PATHOLOGY:  N/A to current situation.  ASSESSMENT/PLAN:   Stage IV, HER2 POSITIVE breast cancer.  She recently started her first cycle of palliative chemotherapy consisting of Docetaxel/Herceptin/Perjeta on 03/06/2016 with Neulasta support.  Currently D7C1.    Febrile neutropenia- secondary to systemic chemotherapy (Docetaxel).  She did receive Neulasta per protocol post-treatment.  Therefore, there is no indication for G-CSF.   Her Nadir is right about this time and I would suspect improvement in counts over the next 48-72 hours.  Chest xray, UA and culture, and blood cultures are all negative at this time.    Loose stools x 2 today.  Will need to monitor as this can be a side effect of Perjeta therapy.  This is managed in the typical fashion if needed.  No treatment indicated at this time.  Sore throat- erythema of buccal mucosa and posterior pharynx.  Order is placed for [facility] Mouthwash with lidocaine.  No clear thrush, but a small right sided buccal white spot is noted. PO diflucan ordered.  Abdominal pain- right sided. Hyperactive bowel sounds on exam.  No reported nausea or vomiting.  I will defer future dose reduction to Dr. Talbert Cage if indicated.  Cycle #2 is schedule for 03/27/2016.  I suspect the patient would benefit from a dose reduction in Docetaxel.  [facility] at [facility] is  scheduled for 03/14/2016.  If patient is discharged, will follow-up with patient at that time.  If patient remains hospitalized, will move appointment.  Pager: [phone]  Patient and plan discussed with Dr. Twana First and she is in agreement with the aforementioned.   Robynn Pane, PA-C 03/12/2016 7:57 PM

## 2016-03-12 NOTE — Progress Notes (Signed)
Patient had gotten OOB to ambulate to bathroom with IV running. Port needle had gotten pulled out of port during transfer. [place] social research officer. Patient tolerated well.

## 2016-03-12 NOTE — Progress Notes (Signed)
Patient ID: Sheila Garrison, female   DOB: 17-Jan-1956, 61 y.o.   MRN: [id number]                                                                PROGRESS NOTE                                                                                                                                                                                                             Patient Demographics:    Sheila Garrison, is a 61 y.o. female, DOB - September 29, 1955, [id number]  Admit date - 03/11/2016   Admitting Physician Reubin Milan, MD  Outpatient Primary MD for the patient is [name], [place]  LOS - 1  Outpatient Specialists:   Chief Complaint  Patient presents with  . Fever       Brief Narrative    61 y.o. female with medical history significant of anxiety, breast cancer with bone metastasis, COPD, hyperlipidemia, hypertension, history of MRSA positive culture, bilateral toes neuropathy, history of tobacco abuse who is coming to the emergency department with complaints of fever of 100.5 degrees Fahrenheit earlier today. She states that her last chemotherapy treatment was 5 days ago on Tuesday. She denies fever until today. She denies headaches, earaches, sore throat, rhinorrhea, cough, chest pain, abdominal pain, emesis, melena or hematochezia. She complains of nausea, decreased appetite and mildly loose stools. She denies dysuria or frequency.  ED Course: Her workup in the emergency department shows EKG normal sinus rhythm, chest radiograph no acute infiltrate, UA shows no signs of infection, WBC 1.3, hemoglobin 11.9 g/dL and platelets 195. She has a normal lactic acid at 0.8, sodium 137, potassium 3.8, chloride 105, bicarbonate 25 mmol/L. Her troponin and lipase levels are within normal range. Influenza by PCR screening was negative and her chest radiograph shows COPD, but no acute cardiopulmonary pathology.   Subjective:    [name] today has been afebrile,  Tolerating abx.  No adverse  reaction.  Pt denies headache, cp, palp, sob, cough, abd pain, diarrhea, brbpr, black stool.     Assessment  & Plan :    Principal Problem:   Febrile neutropenia (HCC) Active Problems:   Hypertension   Hyperlipidemia   GAD (generalized anxiety disorder)   GERD (gastroesophageal reflux disease)  [name], stage 4, left (HCC)   COPD (chronic obstructive pulmonary disease) (HCC)   Febrile neutropenia ([place]) Source unclear Neutropenic precautions. Continue cefepime every 8 hours. Follow CBC daily.  Follow-up blood cultures and sensitivity. Oncology consulted   Hypertension Continue losartan 100 mg by mouth daily. Holding lasix  Hyperlipidemia Continue Crestor 20 mg by mouth daily.    GAD (generalized anxiety disorder) Continue lorazepam 0.5 mg by mouth as needed.    GERD (gastroesophageal reflux disease) Continue proton pump inhibitor.    [name], stage 4, left [facility]) Continue oncology follow-up as scheduled.    COPD (chronic obstructive pulmonary disease) (HCC) Continue supplemental oxygen. Continue albuterol MDI as needed.     DVT prophylaxis: Lovenox SQ. Code Status: Full code. Family Communication:  Disposition Plan:  home Consults called:  Admission status: Inpatient/MedSurg.   Lab Results  Component Value Date   PLT 198 03/12/2016    Antibiotics  :  Cefepime 2/4=>  Anti-infectives    Start     Dose/Rate Route Frequency Ordered Stop   03/12/16 0600  ceFEPIme (MAXIPIME) 2 g in dextrose 5 % 50 mL IVPB     2 g 100 mL/hr over 30 Minutes Intravenous Every 8 hours 03/12/16 0009     03/11/16 2145  ceFEPIme (MAXIPIME) 2 g in dextrose 5 % 50 mL IVPB     2 g 100 mL/hr over 30 Minutes Intravenous  Once 03/11/16 2137 03/11/16 2338        Objective:   Vitals:   03/11/16 2157 03/11/16 2331 03/11/16 2357 03/12/16 0630  BP: 110/69  (!) 110/58 (!) 95/57  Pulse: 91 92 95 93  Resp: 17 15 18 20     Temp:  100.1 F (37.8 C) 99.5 F (37.5 C) 99.2 F (37.3 C)  TempSrc:  Oral Oral Oral  SpO2: 94% 97% 97% 99%  Weight:   69.4 kg (153 lb)   Height:   5\' 4"  (1.626 m)     Wt Readings from Last 3 Encounters:  03/11/16 69.4 kg (153 lb)  01/15/13 78.5 kg (173 lb)  12/06/10 (P) 76.2 kg (168 lb)     Intake/Output Summary (Last 24 hours) at 03/12/16 0937 Last data filed at 03/12/16 0730  Gross per 24 hour  Intake          1086.67 ml  Output                0 ml  Net          1086.67 ml     Physical Exam  Awake Alert, Oriented X 3, No new F.N deficits, Normal affect [place].AT,PERRAL Supple Neck,No JVD, No cervical lymphadenopathy appriciated.  Symmetrical Chest wall movement, Good air movement bilaterally, CTAB RRR,No Gallops,Rubs or new Murmurs, No Parasternal Heave +ve B.Sounds, Abd Soft, No tenderness, No organomegaly appriciated, No rebound - guarding or rigidity. No Cyanosis, Clubbing or edema, No new Rash or bruise  Port in right upper chest,  No erythema.     Data Review:    CBC  Recent Labs Lab 03/06/16 1031 03/11/16 2032 03/12/16 0604  WBC 7.2 1.3* 1.5*  HGB 12.6 11.9* 11.3*  HCT 38.1 36.2 33.6*  PLT 213 195 198  MCV 94.5 94.8 95.5  MCH 31.3 31.2 32.1  MCHC 33.1 32.9 33.6  RDW 16.1* 17.0* 17.1*  LYMPHSABS 0.2* 0.4* 0.5*  MONOABS 0.6 0.3 0.5  EOSABS 0.0 0.0 0.0  BASOSABS 0.0 0.0 0.0    Chemistries  Recent Labs Lab 03/06/16 1031 03/11/16 2032 03/12/16 0604  NA 138 137 137  K 3.8 3.8 3.8  CL 100* 105 106  CO2 27 25 24   GLUCOSE 134* 99 100*  BUN 20 13 11   CREATININE 0.67 0.50 0.53  CALCIUM 9.6 7.6* 7.0*  AST 41 28 25  ALT 18 20 18   ALKPHOS 91 70 62  BILITOT 0.4 0.4 0.6   ------------------------------------------------------------------------------------------------------------------ No results for input(s): CHOL, HDL, LDLCALC, TRIG, CHOLHDL, LDLDIRECT in the last 72 hours.  No results found for:  HGBA1C ------------------------------------------------------------------------------------------------------------------ No results for input(s): TSH, T4TOTAL, T3FREE, THYROIDAB in the last 72 hours.  Invalid input(s): FREET3 ------------------------------------------------------------------------------------------------------------------ No results for input(s): VITAMINB12, FOLATE, FERRITIN, TIBC, IRON, RETICCTPCT in the last 72 hours.  Coagulation profile No results for input(s): INR, PROTIME in the last 168 hours.  No results for input(s): DDIMER in the last 72 hours.  Cardiac Enzymes  Recent Labs Lab 03/11/16 2032  TROPONINI <0.03   ------------------------------------------------------------------------------------------------------------------    Component Value Date/Time   BNP 205.0 (H) 12/18/2015 2030    Inpatient Medications  Scheduled Meds: . ceFEPime (MAXIPIME) IV  2 g Intravenous Q8H  . Chlorhexidine Gluconate Cloth  6 each Topical Q0600  . enoxaparin (LOVENOX) injection  40 mg Subcutaneous Q24H  . feeding supplement (ENSURE ENLIVE)  237 mL Oral BID BM  . loratadine  10 mg Oral Daily  . losartan  100 mg Oral Daily  . mupirocin ointment  1 application Nasal BID  . potassium chloride SA  20 mEq Oral BID  . rosuvastatin  20 mg Oral Daily   Continuous Infusions: . sodium chloride 100 mL/hr at 03/11/16 2038  . sodium chloride 100 mL/hr at 03/12/16 0652   PRN Meds:.albuterol, alum & mag hydroxide-simeth, LORazepam, menthol-cetylpyridinium, naproxen, ondansetron **OR** ondansetron (ZOFRAN) IV, ondansetron, prochlorperazine  Micro Results Recent Results (from the past 240 hour(s))  Culture, blood (routine x 2)     Status: None (Preliminary result)   Collection Time: 03/11/16  8:32 PM  Result Value Ref Range Status   Specimen Description SITE NOT SPECIFIED  Final   Special Requests BOTTLES DRAWN AEROBIC AND ANAEROBIC 6CC EACH  Final   Culture NO GROWTH < 12  HOURS  Final   Report Status PENDING  Incomplete  Culture, blood (routine x 2)     Status: None (Preliminary result)   Collection Time: 03/11/16  8:43 PM  Result Value Ref Range Status   Specimen Description BLOOD RIGHT HAND  Final   Special Requests BOTTLES DRAWN AEROBIC AND ANAEROBIC 5CC EACH  Final   Culture NO GROWTH < 12 HOURS  Final   Report Status PENDING  Incomplete  MRSA PCR Screening     Status: Abnormal   Collection Time: 03/12/16 12:10 AM  Result Value Ref Range Status   MRSA by PCR POSITIVE (A) NEGATIVE Final    Comment:        The GeneXpert MRSA Assay (FDA approved for NASAL specimens only), is one component of a comprehensive MRSA colonization surveillance program. It is not intended to diagnose MRSA infection nor to guide or monitor treatment for MRSA infections. RESULT CALLED TO, READ BACK BY AND VERIFIED WITH:  STOCUM,B @ [place] ON 03/12/16 BY JUW     Radiology Reports Dg Chest 2 View  Result Date: 03/11/2016 CLINICAL DATA:  Fevers EXAM: CHEST  2 VIEW COMPARISON:  01/16/2016, 02/20/2016 FINDINGS: Right chest wall port is again noted. Cardiac shadow is stable. Mild interstitial changes are again seen and  stable. Lungs are hyperinflated consistent with COPD. Postsurgical changes are noted in the axilla bilaterally consistent the given clinical history. Some fullness is noted in right hilar region which was not present on the prior exam and may be projectional in nature. Mild density is noted in the right upper lobe secondary to pleural disease anteriorly on the right. Stable sclerosis of the T8 vertebra is noted consistent with bony metastatic disease. IMPRESSION: COPD. Bony metastatic disease. Electronically Signed   By: Inez Catalina M.D.   On: 03/11/2016 19:50    Time Spent in minutes  30   Jani Gravel M.D on 03/12/2016 at 9:37 AM  Between 7am to 7pm - Pager - [phone]  After 7pm go to [email or web address] - password [facility]  [facility] -  Office   [phone]

## 2016-03-13 ENCOUNTER — Inpatient Hospital Stay (HOSPITAL_COMMUNITY): Payer: PRIVATE HEALTH INSURANCE

## 2016-03-13 DIAGNOSIS — I10 Essential (primary) hypertension: Secondary | ICD-10-CM

## 2016-03-13 DIAGNOSIS — E784 Other hyperlipidemia: Secondary | ICD-10-CM

## 2016-03-13 DIAGNOSIS — F411 Generalized anxiety disorder: Secondary | ICD-10-CM

## 2016-03-13 DIAGNOSIS — J439 Emphysema, unspecified: Secondary | ICD-10-CM

## 2016-03-13 LAB — CBC WITH DIFFERENTIAL/PLATELET
BASOS PCT: 2 %
Basophils Absolute: 0.1 10*3/uL (ref 0.0–0.1)
EOS ABS: 0 10*3/uL (ref 0.0–0.7)
Eosinophils Relative: 0 %
HCT: 34.1 % — ABNORMAL LOW (ref 36.0–46.0)
HEMOGLOBIN: 10.9 g/dL — AB (ref 12.0–15.0)
LYMPHS ABS: 0.6 10*3/uL — AB (ref 0.7–4.0)
Lymphocytes Relative: 13 %
MCH: 30.8 pg (ref 26.0–34.0)
MCHC: 32 g/dL (ref 30.0–36.0)
MCV: 96.3 fL (ref 78.0–100.0)
MONO ABS: 1.4 10*3/uL — AB (ref 0.1–1.0)
Monocytes Relative: 29 %
NEUTROS ABS: 2.7 10*3/uL (ref 1.7–7.7)
Neutrophils Relative %: 56 %
PLATELETS: 208 10*3/uL (ref 150–400)
RBC: 3.54 MIL/uL — ABNORMAL LOW (ref 3.87–5.11)
RDW: 17.2 % — AB (ref 11.5–15.5)
WBC: 4.8 10*3/uL (ref 4.0–10.5)

## 2016-03-13 LAB — URINE CULTURE

## 2016-03-13 LAB — COMPREHENSIVE METABOLIC PANEL
ALBUMIN: 2.6 g/dL — AB (ref 3.5–5.0)
ALK PHOS: 62 U/L (ref 38–126)
ALT: 18 U/L (ref 14–54)
AST: 25 U/L (ref 15–41)
Anion gap: 6 (ref 5–15)
BILIRUBIN TOTAL: 0.5 mg/dL (ref 0.3–1.2)
BUN: 7 mg/dL (ref 6–20)
CALCIUM: 7.1 mg/dL — AB (ref 8.9–10.3)
CO2: 24 mmol/L (ref 22–32)
Chloride: 108 mmol/L (ref 101–111)
Creatinine, Ser: 0.45 mg/dL (ref 0.44–1.00)
GFR calc Af Amer: >60 mL/min (ref >60)
GFR calc non Af Amer: >60 mL/min (ref >60)
GLUCOSE: 92 mg/dL (ref 65–99)
Potassium: 4 mmol/L (ref 3.5–5.1)
SODIUM: 138 mmol/L (ref 135–145)
Total Protein: 5.5 g/dL — ABNORMAL LOW (ref 6.5–8.1)

## 2016-03-13 LAB — D-DIMER, QUANTITATIVE (NOT AT ARMC): D DIMER QUANT: 0.83 ug{FEU}/mL — AB (ref 0.00–0.50)

## 2016-03-13 MED ORDER — HEPARIN SOD (PORK) LOCK FLUSH 100 UNIT/ML IV SOLN: 500.0000 [IU] | INTRAVENOUS | Status: DC | PRN

## 2016-03-13 MED ORDER — IPRATROPIUM-ALBUTEROL 0.5-2.5 (3) MG/3ML IN SOLN
3.0000 mL | Freq: Four times a day (QID) | RESPIRATORY_TRACT | Status: DC
  Administered 2016-03-14: 3 mL via RESPIRATORY_TRACT
  Filled 2016-03-13: qty 3

## 2016-03-13 MED ORDER — HEPARIN SOD (PORK) LOCK FLUSH 100 UNIT/ML IV SOLN: 500.0000 [IU] | INTRAVENOUS | Status: DC

## 2016-03-13 NOTE — Progress Notes (Signed)
Patient ID: Sheila Garrison, female   DOB: 1955/03/02, 61 y.o.   MRN: [id number]                                                                PROGRESS NOTE                                                                                                                                                                                                             Patient Demographics:    Sheila Garrison, is a 60 y.o. female, DOB - 1955-06-18, [id number]  Admit date - 03/11/2016   Admitting Physician Reubin Milan, MD  Outpatient Primary MD for the patient is [name], [place]  LOS - 2  Outpatient Specialists:   Chief Complaint  Patient presents with  . Fever       Brief Narrative    61 y.o. female with medical history significant of anxiety, breast cancer with bone metastasis, COPD, hyperlipidemia, hypertension, history of MRSA positive culture, bilateral toes neuropathy, history of tobacco abuse who is coming to the emergency department with complaints of fever of 100.5 degrees Fahrenheit earlier today. She states that her last chemotherapy treatment was 5 days ago on Tuesday. She denies fever until today. She denies headaches, earaches, sore throat, rhinorrhea, cough, chest pain, abdominal pain, emesis, melena or hematochezia. She complains of nausea, decreased appetite and mildly loose stools. She denies dysuria or frequency.  ED Course: Her workup in the emergency department shows EKG normal sinus rhythm, chest radiograph no acute infiltrate, UA shows no signs of infection, WBC 1.3, hemoglobin 11.9 g/dL and platelets 195. She has a normal lactic acid at 0.8, sodium 137, potassium 3.8, chloride 105, bicarbonate 25 mmol/L. Her troponin and lipase levels are within normal range. Influenza by PCR screening was negative and her chest radiograph shows COPD, but no acute cardiopulmonary pathology.   Subjective:    Sheila Garrison no chest pain. Continues to have sores in mouth.    Assessment  & Plan :    Principal Problem:   Febrile neutropenia (HCC) Active Problems:   Hypertension   Hyperlipidemia   GAD (generalized anxiety disorder)   GERD (gastroesophageal reflux disease)   [name], stage 4, left (HCC)   COPD (chronic obstructive pulmonary disease) ([place])  Febrile neutropenia (HCC) Source unclear Continue cefepime every 8 hours. Follow CBC daily. Neutropenia now resolved Follow-up blood cultures and sensitivity. Oncology following   Hypertension Continue losartan 100 mg by mouth daily. Holding lasix  Hyperlipidemia Continue Crestor 20 mg by mouth daily.    GAD (generalized anxiety disorder) Continue lorazepam 0.5 mg by mouth as needed.    GERD (gastroesophageal reflux disease) Continue proton pump inhibitor.    [name], stage 4, left [facility]) Continue oncology follow-up as scheduled.    COPD (chronic obstructive pulmonary disease) (HCC) Continue supplemental oxygen. Continue albuterol MDI as needed. Patient became very short of breath on ambulation today and tachycardic. Will check chest xray and start on nebs. Monitor overnight.     DVT prophylaxis: Lovenox SQ. Code Status: Full code. Family Communication:  Disposition Plan:  home Consults called:  oncology Admission status: Inpatient/MedSurg.   Lab Results  Component Value Date   PLT 208 03/13/2016    Antibiotics  :  Cefepime 2/4=>  fluconazole 2/5>>  Anti-infectives    Start     Dose/Rate Route Frequency Ordered Stop   03/12/16 2030  fluconazole (DIFLUCAN) tablet 100 mg     100 mg Oral Daily 03/12/16 2019     03/12/16 0600  ceFEPIme (MAXIPIME) 2 g in dextrose 5 % 50 mL IVPB     2 g 100 mL/hr over 30 Minutes Intravenous Every 8 hours 03/12/16 0009     03/11/16 2145  ceFEPIme (MAXIPIME) 2 g in dextrose 5 % 50 mL IVPB     2 g 100 mL/hr over 30 Minutes Intravenous  Once 03/11/16 2137 03/11/16 2338         Objective:   Vitals:   03/13/16 1550 03/13/16 1604 03/13/16 1605 03/13/16 1609  BP:    (!) 150/86  Pulse:    (!) 118  Resp:    20  Temp:    99.4 F (37.4 C)  TempSrc:      SpO2: (!) 71% (!) 85% 91% 91%  Weight:      Height:        Wt Readings from Last 3 Encounters:  03/11/16 69.4 kg (153 lb)  01/15/13 78.5 kg (173 lb)  12/06/10 (P) 76.2 kg (168 lb)     Intake/Output Summary (Last 24 hours) at 03/13/16 1626 Last data filed at 03/13/16 0900  Gross per 24 hour  Intake          1588.33 ml  Output                0 ml  Net          1588.33 ml     Physical Exam  Awake Alert, Oriented X 3, No new F.N deficits, Normal affect [place].AT,PERRAL Supple Neck,No JVD, No cervical lymphadenopathy appriciated.  Symmetrical Chest wall movement, Good air movement bilaterally, CTAB RRR,No Gallops,Rubs or new Murmurs, No Parasternal Heave +ve B.Sounds, Abd Soft, No tenderness, No organomegaly appriciated, No rebound - guarding or rigidity. No Cyanosis, Clubbing or edema, No new Rash or bruise  Port in right upper chest,  No erythema.     Data Review:    CBC  Recent Labs Lab 03/11/16 2032 03/12/16 0604 03/13/16 0522  WBC 1.3* 1.5* 4.8  HGB 11.9* 11.3* 10.9*  HCT 36.2 33.6* 34.1*  PLT 195 198 208  MCV 94.8 95.5 96.3  MCH 31.2 32.1 30.8  MCHC 32.9 33.6 32.0  RDW 17.0* 17.1* 17.2*  LYMPHSABS 0.4* 0.5* 0.6*  MONOABS  0.3 0.5 1.4*  EOSABS 0.0 0.0 0.0  BASOSABS 0.0 0.0 0.1    Chemistries   Recent Labs Lab 03/11/16 2032 03/12/16 0604 03/13/16 0522  NA 137 137 138  K 3.8 3.8 4.0  CL 105 106 108  CO2 25 24 24   GLUCOSE 99 100* 92  BUN 13 11 7   CREATININE 0.50 0.53 0.45  CALCIUM 7.6* 7.0* 7.1*  AST 28 25 25   ALT 20 18 18   ALKPHOS 70 62 62  BILITOT 0.4 0.6 0.5   ------------------------------------------------------------------------------------------------------------------ No results for input(s): CHOL, HDL, LDLCALC, TRIG, CHOLHDL, LDLDIRECT in the last 72  hours.  No results found for: HGBA1C ------------------------------------------------------------------------------------------------------------------ No results for input(s): TSH, T4TOTAL, T3FREE, THYROIDAB in the last 72 hours.  Invalid input(s): FREET3 ------------------------------------------------------------------------------------------------------------------ No results for input(s): VITAMINB12, FOLATE, FERRITIN, TIBC, IRON, RETICCTPCT in the last 72 hours.  Coagulation profile No results for input(s): INR, PROTIME in the last 168 hours.  No results for input(s): DDIMER in the last 72 hours.  Cardiac Enzymes  Recent Labs Lab 03/11/16 2032  TROPONINI <0.03   ------------------------------------------------------------------------------------------------------------------    Component Value Date/Time   BNP 205.0 (H) 12/18/2015 2030    Inpatient Medications  Scheduled Meds: . ceFEPime (MAXIPIME) IV  2 g Intravenous Q8H  . Chlorhexidine Gluconate Cloth  6 each Topical Q0600  . enoxaparin (LOVENOX) injection  40 mg Subcutaneous Q24H  . feeding supplement (ENSURE ENLIVE)  237 mL Oral BID BM  . fluconazole  100 mg Oral Daily  . heparin lock flush  500 Units Intracatheter Q30 days  . ipratropium-albuterol  3 mL Nebulization Q6H  . loratadine  10 mg Oral Daily  . losartan  25 mg Oral Daily  . mupirocin ointment  1 application Nasal BID  . potassium chloride SA  20 mEq Oral BID  . rosuvastatin  20 mg Oral Daily   Continuous Infusions:  PRN Meds:.albuterol, alum & mag hydroxide-simeth, heparin lock flush **AND** heparin lock flush, lidocaine, LORazepam, menthol-cetylpyridinium, naproxen, ondansetron **OR** ondansetron (ZOFRAN) IV, ondansetron, prochlorperazine, simethicone  Micro Results Recent Results (from the past 240 hour(s))  Urine culture     Status: Abnormal   Collection Time: 03/11/16  7:10 PM  Result Value Ref Range Status   Specimen Description URINE,  CLEAN CATCH  Final   Special Requests NONE  Final   Culture MULTIPLE SPECIES PRESENT, SUGGEST RECOLLECTION (A)  Final   Report Status 03/13/2016 FINAL  Final  Culture, blood (routine x 2)     Status: None (Preliminary result)   Collection Time: 03/11/16  8:32 PM  Result Value Ref Range Status   Specimen Description SITE NOT SPECIFIED  Final   Special Requests BOTTLES DRAWN AEROBIC AND ANAEROBIC 6CC EACH  Final   Culture NO GROWTH 2 DAYS  Final   Report Status PENDING  Incomplete  Culture, blood (routine x 2)     Status: None (Preliminary result)   Collection Time: 03/11/16  8:43 PM  Result Value Ref Range Status   Specimen Description BLOOD RIGHT HAND  Final   Special Requests BOTTLES DRAWN AEROBIC AND ANAEROBIC 5CC EACH  Final   Culture NO GROWTH 2 DAYS  Final   Report Status PENDING  Incomplete  MRSA PCR Screening     Status: Abnormal   Collection Time: 03/12/16 12:10 AM  Result Value Ref Range Status   MRSA by PCR POSITIVE (A) NEGATIVE Final    Comment:        The GeneXpert MRSA Assay (FDA approved for  NASAL specimens only), is one component of a comprehensive MRSA colonization surveillance program. It is not intended to diagnose MRSA infection nor to guide or monitor treatment for MRSA infections. RESULT CALLED TO, READ BACK BY AND VERIFIED WITH:  STOCUM,B @ [place] ON 03/12/16 BY JUW     Radiology Reports Dg Chest 2 View  Result Date: 03/11/2016 CLINICAL DATA:  Fevers EXAM: CHEST  2 VIEW COMPARISON:  01/16/2016, 02/20/2016 FINDINGS: Right chest wall port is again noted. Cardiac shadow is stable. Mild interstitial changes are again seen and stable. Lungs are hyperinflated consistent with COPD. Postsurgical changes are noted in the axilla bilaterally consistent the given clinical history. Some fullness is noted in right hilar region which was not present on the prior exam and may be projectional in nature. Mild density is noted in the right upper lobe secondary to pleural disease  anteriorly on the right. Stable sclerosis of the T8 vertebra is noted consistent with bony metastatic disease. IMPRESSION: COPD. Bony metastatic disease. Electronically Signed   By: Inez Catalina M.D.   On: 03/11/2016 19:50    Time Spent in minutes  69   MEMON,JEHANZEB M.D on 03/13/2016 at 4:26 PM  Between 7am to 7pm - Pager - [phone]  After 7pm go to [email or web address] - password [email or web address]  [facility] -  Office  [phone]

## 2016-03-13 NOTE — Progress Notes (Signed)
Patient ambulated to nurses station with RW and one assist.  On RA dropped to 71% and became moderately SOB.  sats increased to 85% when on 2L O2 [place].  After returning to room and sitting for 1-2 minutes sat increased to 91%, SOB easing.  MD made aware.

## 2016-03-14 ENCOUNTER — Other Ambulatory Visit (HOSPITAL_COMMUNITY): Payer: PRIVATE HEALTH INSURANCE

## 2016-03-14 ENCOUNTER — Inpatient Hospital Stay (HOSPITAL_COMMUNITY): Payer: PRIVATE HEALTH INSURANCE

## 2016-03-14 ENCOUNTER — Ambulatory Visit (HOSPITAL_COMMUNITY): Payer: PRIVATE HEALTH INSURANCE | Admitting: Hematology & Oncology

## 2016-03-14 ENCOUNTER — Encounter (HOSPITAL_COMMUNITY): Payer: PRIVATE HEALTH INSURANCE | Admitting: Oncology

## 2016-03-14 DIAGNOSIS — K219 Gastro-esophageal reflux disease without esophagitis: Secondary | ICD-10-CM

## 2016-03-14 MED ORDER — IOPAMIDOL (ISOVUE-370) INJECTION 76%
100.0000 mL | Freq: Once | INTRAVENOUS | Status: AC | PRN
  Administered 2016-03-14: 100 mL via INTRAVENOUS

## 2016-03-14 MED ORDER — IPRATROPIUM-ALBUTEROL 0.5-2.5 (3) MG/3ML IN SOLN
3.0000 mL | Freq: Three times a day (TID) | RESPIRATORY_TRACT | Status: DC
  Administered 2016-03-14 – 2016-03-15 (×4): 3 mL via RESPIRATORY_TRACT
  Filled 2016-03-14 (×4): qty 3

## 2016-03-14 MED ORDER — MOMETASONE FURO-FORMOTEROL FUM 200-5 MCG/ACT IN AERO
2.0000 | INHALATION_SPRAY | Freq: Two times a day (BID) | RESPIRATORY_TRACT | Status: DC
  Administered 2016-03-14 – 2016-03-15 (×2): 2 via RESPIRATORY_TRACT
  Filled 2016-03-14: qty 8.8

## 2016-03-14 NOTE — Progress Notes (Signed)
Patient ID: Sheila Garrison, female   DOB: [birth year], 61 y.o.   MRN: [id number]                                                                PROGRESS NOTE                                                                                                                                                                                                             Patient Demographics:    Sheila Garrison, is a 61 y.o. female, DOB - [birth year], [id number]  Admit date - 03/11/2016   Admitting Physician Reubin Milan, MD  Outpatient Primary MD for the patient is [name], [place]  LOS - 3  Outpatient Specialists:   Chief Complaint  Patient presents with  . Fever       Brief Narrative    61 y.o. female with medical history significant of anxiety, breast cancer with bone metastasis, COPD, hyperlipidemia, hypertension, history of MRSA positive culture, bilateral toes neuropathy, history of tobacco abuse who is coming to the emergency department with complaints of fever of 100.5 degrees Fahrenheit earlier today. She states that her last chemotherapy treatment was 5 days ago on Tuesday. She denies fever until today. She denies headaches, earaches, sore throat, rhinorrhea, cough, chest pain, abdominal pain, emesis, melena or hematochezia. She complains of nausea, decreased appetite and mildly loose stools. She denies dysuria or frequency.  ED Course: Her workup in the emergency department shows EKG normal sinus rhythm, chest radiograph no acute infiltrate, UA shows no signs of infection, WBC 1.3, hemoglobin 11.9 g/dL and platelets 195. She has a normal lactic acid at 0.8, sodium 137, potassium 3.8, chloride 105, bicarbonate 25 mmol/L. Her troponin and lipase levels are within normal range. Influenza by PCR screening was negative and her chest radiograph shows COPD, but no acute cardiopulmonary pathology.   Subjective:    Hanni Aloia feeling better today. Still feels mildly short of  breath.   Assessment  & Plan :    Principal Problem:   Febrile neutropenia (HCC) Active Problems:   Hypertension   Hyperlipidemia   GAD (generalized anxiety disorder)   GERD (gastroesophageal reflux disease)   [name], stage 4, left (HCC)   COPD (chronic obstructive pulmonary disease) ([place])  Febrile neutropenia (HCC) Source unclear Continue cefepime every 8 hours. Follow CBC daily. Neutropenia now resolved Blood cultures show no significant growth. Oncology following   Hypertension Continue losartan 100 mg by mouth daily. Holding lasix  Hyperlipidemia Continue Crestor 20 mg by mouth daily.    GAD (generalized anxiety disorder) Continue lorazepam 0.5 mg by mouth as needed.    GERD (gastroesophageal reflux disease) Continue proton pump inhibitor.    [name], stage 4, left [facility]) Continue oncology follow-up as scheduled.    COPD (chronic obstructive pulmonary disease) (HCC) Continue supplemental oxygen. Continue albuterol MDI as needed. Patient became very short of breath on ambulation today and tachycardic. Chest x-ray did not show any significant findings. D-dimer mildly elevated. With her history of malignancy, will check CT chest rule out PE.     DVT prophylaxis: Lovenox SQ. Code Status: Full code. Family Communication:  discussed with husband Disposition Plan:  home, in a.m. Consults called:  oncology Admission status: Inpatient/MedSurg.   Lab Results  Component Value Date   PLT 208 03/13/2016    Antibiotics  :  Cefepime 2/4=>  fluconazole 2/5>>  Anti-infectives    Start     Dose/Rate Route Frequency Ordered Stop   03/12/16 2030  fluconazole (DIFLUCAN) tablet 100 mg     100 mg Oral Daily 03/12/16 2019     03/12/16 0600  ceFEPIme (MAXIPIME) 2 g in dextrose 5 % 50 mL IVPB     2 g 100 mL/hr over 30 Minutes Intravenous Every 8 hours 03/12/16 0009     03/11/16 2145  ceFEPIme (MAXIPIME)  2 g in dextrose 5 % 50 mL IVPB     2 g 100 mL/hr over 30 Minutes Intravenous  Once 03/11/16 2137 03/11/16 2338        Objective:   Vitals:   03/14/16 0528 03/14/16 0756 03/14/16 1422 03/14/16 1517  BP: 119/64   116/64  Pulse: 97   97  Resp: 20   18  Temp: 98.4 F (36.9 C)   99 F (37.2 C)  TempSrc: Oral   Oral  SpO2: 95% 94% 95% 95%  Weight:      Height:        Wt Readings from Last 3 Encounters:  03/11/16 69.4 kg (153 lb)  01/15/13 78.5 kg (173 lb)  12/06/10 (P) 76.2 kg (168 lb)     Intake/Output Summary (Last 24 hours) at 03/14/16 1904 Last data filed at 03/14/16 1200  Gross per 24 hour  Intake              600 ml  Output                0 ml  Net              600 ml     Physical Exam  Awake Alert, Oriented X 3, No new F.N deficits, Normal affect [place].AT,PERRAL Supple Neck,No JVD, No cervical lymphadenopathy appriciated.  Symmetrical Chest wall movement, Diminished breath sounds bilaterally, no wheezing RRR,No Gallops,Rubs or new Murmurs, No Parasternal Heave +ve B.Sounds, Abd Soft, No tenderness, No organomegaly appriciated, No rebound - guarding or rigidity. No Cyanosis, Clubbing or edema, No new Rash or bruise  Port in right upper chest,  No erythema.     Data Review:    CBC  Recent Labs Lab 03/11/16 2032 03/12/16 0604 03/13/16 0522  WBC 1.3* 1.5* 4.8  HGB 11.9* 11.3* 10.9*  HCT 36.2 33.6* 34.1*  PLT 195 198 208  MCV 94.8 95.5 96.3  MCH 31.2 32.1 30.8  MCHC 32.9 33.6 32.0  RDW 17.0* 17.1* 17.2*  LYMPHSABS 0.4* 0.5* 0.6*  MONOABS 0.3 0.5 1.4*  EOSABS 0.0 0.0 0.0  BASOSABS 0.0 0.0 0.1    Chemistries   Recent Labs Lab 03/11/16 2032 03/12/16 0604 03/13/16 0522  NA 137 137 138  K 3.8 3.8 4.0  CL 105 106 108  CO2 25 24 24   GLUCOSE 99 100* 92  BUN 13 11 7   CREATININE 0.50 0.53 0.45  CALCIUM 7.6* 7.0* 7.1*  AST 28 25 25   ALT 20 18 18   ALKPHOS 70 62 62  BILITOT 0.4 0.6 0.5    ------------------------------------------------------------------------------------------------------------------ No results for input(s): CHOL, HDL, LDLCALC, TRIG, CHOLHDL, LDLDIRECT in the last 72 hours.  No results found for: HGBA1C ------------------------------------------------------------------------------------------------------------------ No results for input(s): TSH, T4TOTAL, T3FREE, THYROIDAB in the last 72 hours.  Invalid input(s): FREET3 ------------------------------------------------------------------------------------------------------------------ No results for input(s): VITAMINB12, FOLATE, FERRITIN, TIBC, IRON, RETICCTPCT in the last 72 hours.  Coagulation profile No results for input(s): INR, PROTIME in the last 168 hours.   Recent Labs  03/13/16 1812  DDIMER 0.83*    Cardiac Enzymes  Recent Labs Lab 03/11/16 2032  TROPONINI <0.03   ------------------------------------------------------------------------------------------------------------------    Component Value Date/Time   BNP 205.0 (H) 12/18/2015 2030    Inpatient Medications  Scheduled Meds: . ceFEPime (MAXIPIME) IV  2 g Intravenous Q8H  . Chlorhexidine Gluconate Cloth  6 each Topical Q0600  . enoxaparin (LOVENOX) injection  40 mg Subcutaneous Q24H  . feeding supplement (ENSURE ENLIVE)  237 mL Oral BID BM  . fluconazole  100 mg Oral Daily  . heparin lock flush  500 Units Intracatheter Q30 days  . ipratropium-albuterol  3 mL Nebulization TID  . loratadine  10 mg Oral Daily  . losartan  25 mg Oral Daily  . mometasone-formoterol  2 puff Inhalation BID  . mupirocin ointment  1 application Nasal BID  . potassium chloride SA  20 mEq Oral BID  . rosuvastatin  20 mg Oral Daily   Continuous Infusions:  PRN Meds:.albuterol, alum & mag hydroxide-simeth, heparin lock flush **AND** heparin lock flush, lidocaine, LORazepam, menthol-cetylpyridinium, naproxen, ondansetron **OR** ondansetron (ZOFRAN)  IV, ondansetron, prochlorperazine, simethicone  Micro Results Recent Results (from the past 240 hour(s))  Urine culture     Status: Abnormal   Collection Time: 03/11/16  7:10 PM  Result Value Ref Range Status   Specimen Description URINE, CLEAN CATCH  Final   Special Requests NONE  Final   Culture MULTIPLE SPECIES PRESENT, SUGGEST RECOLLECTION (A)  Final   Report Status 03/13/2016 FINAL  Final  Culture, blood (routine x 2)     Status: None (Preliminary result)   Collection Time: 03/11/16  8:32 PM  Result Value Ref Range Status   Specimen Description SITE NOT SPECIFIED  Final   Special Requests BOTTLES DRAWN AEROBIC AND ANAEROBIC 6CC EACH  Final   Culture NO GROWTH 3 DAYS  Final   Report Status PENDING  Incomplete  Culture, blood (routine x 2)     Status: None (Preliminary result)   Collection Time: 03/11/16  8:43 PM  Result Value Ref Range Status   Specimen Description BLOOD RIGHT HAND  Final   Special Requests BOTTLES DRAWN AEROBIC AND ANAEROBIC 5CC EACH  Final   Culture NO GROWTH 3 DAYS  Final   Report Status PENDING  Incomplete  MRSA PCR Screening     Status: Abnormal   Collection Time: 03/12/16  12:10 AM  Result Value Ref Range Status   MRSA by PCR POSITIVE (A) NEGATIVE Final    Comment:        The GeneXpert MRSA Assay (FDA approved for NASAL specimens only), is one component of a comprehensive MRSA colonization surveillance program. It is not intended to diagnose MRSA infection nor to guide or monitor treatment for MRSA infections. RESULT CALLED TO, READ BACK BY AND VERIFIED WITH:  STOCUM,B @ [place] ON 03/12/16 BY JUW     Radiology Reports Dg Chest 2 View  Result Date: 03/11/2016 CLINICAL DATA:  Fevers EXAM: CHEST  2 VIEW COMPARISON:  01/16/2016, 02/20/2016 FINDINGS: Right chest wall port is again noted. Cardiac shadow is stable. Mild interstitial changes are again seen and stable. Lungs are hyperinflated consistent with COPD. Postsurgical changes are noted in the axilla  bilaterally consistent the given clinical history. Some fullness is noted in right hilar region which was not present on the prior exam and may be projectional in nature. Mild density is noted in the right upper lobe secondary to pleural disease anteriorly on the right. Stable sclerosis of the T8 vertebra is noted consistent with bony metastatic disease. IMPRESSION: COPD. Bony metastatic disease. Electronically Signed   By: Inez Catalina M.D.   On: 03/11/2016 19:50   Dg Chest Port 1 View  Result Date: 03/13/2016 CLINICAL DATA:  Shortness of breath and weakness EXAM: PORTABLE CHEST 1 VIEW COMPARISON:  03/11/2016 FINDINGS: Right chest wall port is again seen. Hyperinflation is again noted consistent with COPD. The fullness in the right hilum is again identified and stable. This is again likely related to projection and vascularity given the findings of recent stimulation study. Minimal right pleural fluid is noted stable from the prior exam. No new focal abnormality is seen. IMPRESSION: Stable appearance of the chest. Electronically Signed   By: Inez Catalina M.D.   On: 03/13/2016 17:09    Time Spent in minutes  73   MEMON,JEHANZEB M.D on 03/14/2016 at 7:04 PM  Between 7am to 7pm - Pager - [phone]  After 7pm go to [email or web address] - password [email or web address]  [facility] -  Office  [phone]

## 2016-03-14 NOTE — Significant Event (Addendum)
Called to CT to de-access miniloc port access and re-access with power port needle in CT. Power Port access needed for [employer] [place] site dressed with gauze and tegaderm at time of arrival to CT. Miniloc needle de-accessed and held in left gloved hand. Attempted to engage safety device with gloved right hand, safety device caught on part of gauze dressing. Attempted to re-engage safety device with right hand and needle punctured glove on tip of left middle finger approximately 1840. Minimal blood noted. Water and Soap used to immediately cleanse site. AC and Pt aware. Paperwork and safety zone completed. Pt denies any known blood borne contagions.   Sterile technique used to access power port on right upper chest with power port needle at [place]. Site flushed and blood return noted at time of accessing port in right upper chest. Biopatch applied to base of needle. Biopatch reinforced with gauze and secured with tegaderm. Pt tolerated well.

## 2016-03-14 NOTE — Progress Notes (Signed)
MD (Dr. Marin Comment) phoned and no new orders given in regards to D. Dimer result. Will continue to monitor pt.

## 2016-03-14 NOTE — Progress Notes (Signed)
Followed up with Hospitalist about pt's D-Dimer lab result. Will continue to monitor pt.

## 2016-03-15 MED ORDER — SIMETHICONE 80 MG PO CHEW: 160.0000 mg | CHEWABLE_TABLET | Freq: Four times a day (QID) | ORAL | 0 refills | Status: AC | PRN

## 2016-03-15 MED ORDER — ALBUTEROL SULFATE (2.5 MG/3ML) 0.083% IN NEBU: 3.0000 mL | INHALATION_SOLUTION | Freq: Four times a day (QID) | RESPIRATORY_TRACT | 12 refills | Status: DC | PRN

## 2016-03-15 MED ORDER — HEPARIN SOD (PORK) LOCK FLUSH 100 UNIT/ML IV SOLN
500.0000 [IU] | INTRAVENOUS | Status: AC | PRN
  Administered 2016-03-15: 500 [IU]
  Filled 2016-03-15: qty 5

## 2016-03-15 MED ORDER — FLUCONAZOLE 100 MG PO TABS: 100.0000 mg | ORAL_TABLET | Freq: Every day | ORAL | 0 refills | Status: DC

## 2016-03-15 MED ORDER — LOSARTAN POTASSIUM 100 MG PO TABS: 50.0000 mg | ORAL_TABLET | Freq: Every day | ORAL | 1 refills | Status: AC

## 2016-03-15 NOTE — Progress Notes (Signed)
Reviewed discharge instructions with pt and husband at bedside.   Answered all questions at this time.

## 2016-03-15 NOTE — Discharge Summary (Signed)
Physician Discharge Summary  Sheila Garrison [id number] DOB: 10-19-1955 DOA: 03/11/2016  PCP: [name], FNP  Admit date: 03/11/2016 Discharge date: 03/15/2016  Admitted From: home Disposition:  home  Recommendations for Outpatient Follow-up:  1. Follow up with PCP in 1-2 weeks 2. Please obtain BMP/CBC in one week 3. Follow up with oncology next week as planned  Home Health: Equipment/Devices: oxygen at 2L  Discharge Condition: stable CODE STATUS: full code Diet recommendation: Heart Healthy   Brief/Interim Summary: 61 y.o.femalewith medical history significant of anxiety, breast cancer with bone metastasis, COPD, hyperlipidemia, hypertension, history of MRSA positive culture, bilateral toes neuropathy, history of tobacco abuse who is coming to the emergency department with complaints of fever of 100.5degrees Fahrenheit earlier today. She states that her last chemotherapy treatment was 5 days ago on Tuesday.She denies fever until today. She denies headaches, earaches, sore throat, rhinorrhea, cough, chest pain, abdominal pain, emesis, melena or hematochezia. She complains of nausea, decreased appetite and mildly loose stools. She denies dysuria or frequency.  ED Course:Her workup in the emergency department shows EKG normal sinus rhythm, chest radiograph no acute infiltrate, UA shows no signs of infection, WBC 1.3, hemoglobin 11.9 g/dL and platelets 195. She has a normal lactic acid at 0.8, sodium 137, potassium 3.8, chloride 105, bicarbonate 25 mmol/L. Her troponin and lipase levels are within normal range. Influenza by PCR screening was negative and her chest radiograph shows COPD, but no acute cardiopulmonary pathology.  Discharge Diagnoses:  Principal Problem:   Febrile neutropenia ([place]) Active Problems:   Hypertension   Hyperlipidemia   GAD (generalized anxiety disorder)   GERD (gastroesophageal reflux disease)   [name], stage 4, left  (HCC)   COPD (chronic obstructive pulmonary disease) (HCC)  Febrile neutropenia ([place]) Source unclear, possibly related to chemo Treated with cefepime every 8 hours. Follow CBC daily. Neutropenia now resolved Blood cultures show no significant growth. Oncology following   Hypertension Continue losartan 100 mg by mouth daily. Resume lasix on discharge  Hyperlipidemia Continue Crestor 20 mg by mouth daily.  GAD (generalized anxiety disorder) Continue lorazepam 0.5 mg by mouth as needed.  GERD (gastroesophageal reflux disease) Continue proton pump inhibitor.  [name], stage 4, left [facility]) Continue oncology follow-up as scheduled.  COPD (chronic obstructive pulmonary disease) (HCC) Continue supplemental oxygen. Continue albuterol MDI as needed. Patient became very short of breath on ambulation today and tachycardic. Chest x-ray did not show any significant findings. D-dimer mildly elevated. CT negative for PE. Suspect hypoxia is related to COPD.  Discharge Instructions  Discharge Instructions    Diet - low sodium heart healthy    Complete by:  As directed    Increase activity slowly    Complete by:  As directed      Allergies as of 03/15/2016   No Known Allergies     Medication List    TAKE these medications   albuterol 108 (90 Base) MCG/ACT inhaler Commonly known as:  PROVENTIL HFA;VENTOLIN HFA Inhale 2 puffs into the lungs every 6 (six) hours as needed for wheezing or shortness of breath. What changed:  Another medication with the same name was added. Make sure you understand how and when to take each.   albuterol (2.5 MG/3ML) 0.083% nebulizer solution Commonly known as:  PROVENTIL Inhale 3 mLs into the lungs every 6 (six) hours as needed for wheezing or shortness of breath. What changed:  You were already taking a medication with the same name, and  this prescription was added. Make sure you understand how and when to take  each.   dexamethasone 4 MG tablet Commonly known as:  DECADRON Take the day before, day of, and the day after chemotherapy.  Take 2 tablets in the am and 2 tablets in the pm.  Take with food.   EMERGEN-C IMMUNE PO Take 1 tablet by mouth daily.   fexofenadine 180 MG tablet Commonly known as:  ALLEGRA Take 180 mg by mouth daily.   fluconazole 100 MG tablet Commonly known as:  DIFLUCAN Take 1 tablet (100 mg total) by mouth daily. Start taking on:  03/16/2016   furosemide 40 MG tablet Commonly known as:  LASIX Take 1 tablet (40 mg total) by mouth daily.   Ginkgo Biloba 120 MG Tabs Take 120 mg by mouth daily.   HERCEPTIN IV Inject into the vein every 21 ( twenty-one) days.   lidocaine-prilocaine cream Commonly known as:  EMLA Apply a quarter size amount to affected area 1 hour prior to coming to chemotherapy.   LORazepam 0.5 MG tablet Commonly known as:  ATIVAN TAKE ONE TABLET BY MOUTH EVERY 8 HOURS AS NEEDED What changed:  See the new instructions.   losartan 100 MG tablet Commonly known as:  COZAAR Take 0.5 tablets (50 mg total) by mouth daily. What changed:  how much to take   multivitamin capsule Take 1 capsule by mouth daily.   naproxen sodium 220 MG tablet Commonly known as:  ANAPROX Take 220 mg by mouth 2 (two) times daily as needed (pain). pain   omeprazole 20 MG tablet Commonly known as:  PRILOSEC OTC Take 40 mg by mouth daily.   ondansetron 4 MG disintegrating tablet Commonly known as:  ZOFRAN ODT Take 1 tablet (4 mg total) by mouth every 8 (eight) hours as needed for nausea or vomiting.   ondansetron 8 MG tablet Commonly known as:  ZOFRAN Take 1 tablet (8 mg total) by mouth every 8 (eight) hours as needed for nausea or vomiting.   PERJETA IV Inject into the vein every 21 ( twenty-one) days.   potassium chloride SA 20 MEQ tablet Commonly known as:  K-DUR,KLOR-CON Take 1 tablet (20 mEq total) by mouth 2 (two) times daily.   prochlorperazine 10 MG  tablet Commonly known as:  COMPAZINE Take 1 tablet (10 mg total) by mouth every 6 (six) hours as needed for nausea or vomiting.   rosuvastatin 20 MG tablet Commonly known as:  CRESTOR Take 1 tablet (20 mg total) by mouth daily.   simethicone 80 MG chewable tablet Commonly known as:  MYLICON Chew 2 tablets (160 mg total) by mouth 4 (four) times daily as needed for flatulence.   TAXOTERE IV Inject into the vein every 21 ( twenty-one) days.   Vitamin D 2000 units tablet Take 2,000 Units by mouth 2 (two) times daily.      Follow-up Information    KEFALAS,THOMAS, PA-C Follow up.   Specialty:  Oncology Why:  follow up next week as scheduled Contact information: [address] [place] [address] [phone]          No Known Allergies  Consultations:  oncology   Procedures/Studies: Dg Chest 2 View  Result Date: 03/11/2016 CLINICAL DATA:  Fevers EXAM: CHEST  2 VIEW COMPARISON:  01/16/2016, 02/20/2016 FINDINGS: Right chest wall port is again noted. Cardiac shadow is stable. Mild interstitial changes are again seen and stable. Lungs are hyperinflated consistent with COPD. Postsurgical changes are noted in the axilla  bilaterally consistent the given clinical history. Some fullness is noted in right hilar region which was not present on the prior exam and may be projectional in nature. Mild density is noted in the right upper lobe secondary to pleural disease anteriorly on the right. Stable sclerosis of the T8 vertebra is noted consistent with bony metastatic disease. IMPRESSION: COPD. Bony metastatic disease. Electronically Signed   By: Inez Catalina M.D.   On: 03/11/2016 19:50   Ct Angio Chest Pe W Or Wo Contrast  Result Date: 03/14/2016 CLINICAL DATA:  Shortness of breath. EXAM: CT ANGIOGRAPHY CHEST WITH CONTRAST TECHNIQUE: Multidetector CT imaging of the chest was performed using the standard protocol during bolus administration of intravenous contrast. Multiplanar CT image  reconstructions and MIPs were obtained to evaluate the vascular anatomy. CONTRAST:  100 mL of Isovue 370 intravenously. COMPARISON:  CT scan of December 18, 2015. PET scan of January 23, 2016. FINDINGS: Cardiovascular: Atherosclerosis of thoracic aorta is noted without aneurysm or dissection. There is no evidence of pulmonary embolus. Normal cardiac size. Right subclavian Port-A-Cath is noted with distal tip in SVC. Mediastinum/Nodes: No enlarged mediastinal, hilar, or axillary lymph nodes. Thyroid gland, trachea, and esophagus demonstrate no significant findings. Lungs/Pleura: Mild right pleural effusion is noted with adjacent subsegmental atelectasis. Stable scarring is noted anteriorly in the right upper lobe. No pneumothorax is noted. Right apical scarring is noted. Upper Abdomen: No acute abnormality. Musculoskeletal: There is continued presence of T8 metastatic lesion as identified on prior exam. There is interval development of metastatic lesion at T10 appear continued presence of right ninth rib metastatic lesion is noted as well. Review of the MIP images confirms the above findings. IMPRESSION: Aortic atherosclerosis. No definite evidence of pulmonary embolus. Mild right pleural effusion is noted with adjacent subsegmental atelectasis. Metastatic lesions are noted in T8 and T10 vertebral bodies, as well as in right ninth rib. Electronically Signed   By: Marijo Conception, M.D.   On: 03/14/2016 19:23   Dg Chest Port 1 View  Result Date: 03/13/2016 CLINICAL DATA:  Shortness of breath and weakness EXAM: PORTABLE CHEST 1 VIEW COMPARISON:  03/11/2016 FINDINGS: Right chest wall port is again seen. Hyperinflation is again noted consistent with COPD. The fullness in the right hilum is again identified and stable. This is again likely related to projection and vascularity given the findings of recent stimulation study. Minimal right pleural fluid is noted stable from the prior exam. No new focal abnormality is  seen. IMPRESSION: Stable appearance of the chest. Electronically Signed   By: Inez Catalina M.D.   On: 03/13/2016 17:09       Subjective: Feeling better. No chest pain or cough. Wants to go home  Discharge Exam: Vitals:   03/14/16 2141 03/15/16 0500  BP: 117/66 113/65  Pulse: 97 96  Resp: 18 18  Temp: 98.7 F (37.1 C) 98 F (36.7 C)   Vitals:   03/14/16 2141 03/15/16 0500 03/15/16 0802 03/15/16 0806  BP: 117/66 113/65    Pulse: 97 96    Resp: 18 18    Temp: 98.7 F (37.1 C) 98 F (36.7 C)    TempSrc: Oral Oral    SpO2: 97% 95% 95% 100%  Weight:      Height:        General: Pt is alert, awake, not in acute distress Cardiovascular: RRR, S1/S2 +, no rubs, no gallops Respiratory: diminished breath sounds bilaterally Abdominal: Soft, NT, ND, bowel sounds + Extremities: no edema, no cyanosis  The results of significant diagnostics from this hospitalization (including imaging, microbiology, ancillary and laboratory) are listed below for reference.     Microbiology: Recent Results (from the past 240 hour(s))  Urine culture     Status: Abnormal   Collection Time: 03/11/16  7:10 PM  Result Value Ref Range Status   Specimen Description URINE, CLEAN CATCH  Final   Special Requests NONE  Final   Culture MULTIPLE SPECIES PRESENT, SUGGEST RECOLLECTION (A)  Final   Report Status 03/13/2016 FINAL  Final  Culture, blood (routine x 2)     Status: None (Preliminary result)   Collection Time: 03/11/16  8:32 PM  Result Value Ref Range Status   Specimen Description SITE NOT SPECIFIED  Final   Special Requests BOTTLES DRAWN AEROBIC AND ANAEROBIC 6CC EACH  Final   Culture NO GROWTH 4 DAYS  Final   Report Status PENDING  Incomplete  Culture, blood (routine x 2)     Status: None (Preliminary result)   Collection Time: 03/11/16  8:43 PM  Result Value Ref Range Status   Specimen Description BLOOD RIGHT HAND  Final   Special Requests BOTTLES DRAWN AEROBIC AND ANAEROBIC 5CC EACH   Final   Culture NO GROWTH 4 DAYS  Final   Report Status PENDING  Incomplete  MRSA PCR Screening     Status: Abnormal   Collection Time: 03/12/16 12:10 AM  Result Value Ref Range Status   MRSA by PCR POSITIVE (A) NEGATIVE Final    Comment:        The GeneXpert MRSA Assay (FDA approved for NASAL specimens only), is one component of a comprehensive MRSA colonization surveillance program. It is not intended to diagnose MRSA infection nor to guide or monitor treatment for MRSA infections. RESULT CALLED TO, READ BACK BY AND VERIFIED WITH:  STOCUM,B @ 0228 ON 03/12/16 BY JUW      Labs: BNP (last 3 results)  Recent Labs  12/18/15 2030  BNP [id number]*   Basic Metabolic Panel:  Recent Labs Lab 03/11/16 2032 03/12/16 0604 03/13/16 0522  NA 137 137 138  K 3.8 3.8 4.0  CL 105 106 108  CO2 25 24 24   GLUCOSE 99 100* 92  BUN 13 11 7   CREATININE 0.50 0.53 0.45  CALCIUM 7.6* 7.0* 7.1*   Liver Function Tests:  Recent Labs Lab 03/11/16 2032 03/12/16 0604 03/13/16 0522  AST 28 25 25   ALT 20 18 18   ALKPHOS 70 62 62  BILITOT 0.4 0.6 0.5  PROT 6.1* 5.6* 5.5*  ALBUMIN 3.1* 2.8* 2.6*    Recent Labs Lab 03/11/16 2032  LIPASE 13   No results for input(s): AMMONIA in the last 168 hours. CBC:  Recent Labs Lab 03/11/16 2032 03/12/16 0604 03/13/16 0522  WBC 1.3* 1.5* 4.8  NEUTROABS 0.5* 0.5* 2.7  HGB 11.9* 11.3* 10.9*  HCT 36.2 33.6* 34.1*  MCV 94.8 95.5 96.3  PLT 195 198 208   Cardiac Enzymes:  Recent Labs Lab 03/11/16 2032  TROPONINI <0.03   BNP: Invalid input(s): POCBNP CBG: No results for input(s): GLUCAP in the last 168 hours. D-Dimer  Recent Labs  03/13/16 1812  DDIMER 0.83*   Hgb A1c No results for input(s): HGBA1C in the last 72 hours. Lipid Profile No results for input(s): CHOL, HDL, LDLCALC, TRIG, CHOLHDL, LDLDIRECT in the last 72 hours. Thyroid function studies No results for input(s): TSH, T4TOTAL, T3FREE, THYROIDAB in the last 72  hours.  Invalid input(s): FREET3 Anemia work up No results for  input(s): VITAMINB12, FOLATE, FERRITIN, TIBC, IRON, RETICCTPCT in the last 72 hours. Urinalysis    Component Value Date/Time   COLORURINE YELLOW 03/11/2016 2021   APPEARANCEUR CLEAR 03/11/2016 2021   LABSPEC 1.019 03/11/2016 2021   PHURINE 8.0 03/11/2016 2021   GLUCOSEU NEGATIVE 03/11/2016 2021   HGBUR NEGATIVE 03/11/2016 2021   BILIRUBINUR NEGATIVE 03/11/2016 2021   KETONESUR NEGATIVE 03/11/2016 2021   PROTEINUR 30 (A) 03/11/2016 2021   UROBILINOGEN 0.2 11/20/2010 0910   NITRITE NEGATIVE 03/11/2016 2021   LEUKOCYTESUR NEGATIVE 03/11/2016 2021   Sepsis Labs Invalid input(s): PROCALCITONIN,  WBC,  LACTICIDVEN Microbiology Recent Results (from the past 240 hour(s))  Urine culture     Status: Abnormal   Collection Time: 03/11/16  7:10 PM  Result Value Ref Range Status   Specimen Description URINE, CLEAN CATCH  Final   Special Requests NONE  Final   Culture MULTIPLE SPECIES PRESENT, SUGGEST RECOLLECTION (A)  Final   Report Status 03/13/2016 FINAL  Final  Culture, blood (routine x 2)     Status: None (Preliminary result)   Collection Time: 03/11/16  8:32 PM  Result Value Ref Range Status   Specimen Description SITE NOT SPECIFIED  Final   Special Requests BOTTLES DRAWN AEROBIC AND ANAEROBIC 6CC EACH  Final   Culture NO GROWTH 4 DAYS  Final   Report Status PENDING  Incomplete  Culture, blood (routine x 2)     Status: None (Preliminary result)   Collection Time: 03/11/16  8:43 PM  Result Value Ref Range Status   Specimen Description BLOOD RIGHT HAND  Final   Special Requests BOTTLES DRAWN AEROBIC AND ANAEROBIC 5CC EACH  Final   Culture NO GROWTH 4 DAYS  Final   Report Status PENDING  Incomplete  MRSA PCR Screening     Status: Abnormal   Collection Time: 03/12/16 12:10 AM  Result Value Ref Range Status   MRSA by PCR POSITIVE (A) NEGATIVE Final    Comment:        The GeneXpert MRSA Assay (FDA approved for NASAL  specimens only), is one component of a comprehensive MRSA colonization surveillance program. It is not intended to diagnose MRSA infection nor to guide or monitor treatment for MRSA infections. RESULT CALLED TO, READ BACK BY AND VERIFIED WITH:  STOCUM,B @ 0228 ON 03/12/16 BY JUW      Time coordinating discharge: Over 30 minutes  SIGNED:   Kathie Dike, MD  [facility] 03/15/2016, 11:12 AM Pager   If 7PM-7AM, please contact night-coverage [email or web address] Password [email or web address]

## 2016-03-15 NOTE — Progress Notes (Signed)
Pt port heparin flushed per policy, then de accessed.  Pt tolerated well.

## 2016-03-16 LAB — CULTURE, BLOOD (ROUTINE X 2)
CULTURE: NO GROWTH
Culture: NO GROWTH

## 2016-03-27 ENCOUNTER — Encounter (HOSPITAL_COMMUNITY): Payer: PRIVATE HEALTH INSURANCE | Attending: Oncology

## 2016-03-27 ENCOUNTER — Encounter (HOSPITAL_COMMUNITY): Payer: PRIVATE HEALTH INSURANCE | Attending: Oncology | Admitting: Oncology

## 2016-03-27 ENCOUNTER — Encounter (HOSPITAL_COMMUNITY): Payer: Self-pay | Admitting: Oncology

## 2016-03-27 ENCOUNTER — Other Ambulatory Visit (HOSPITAL_COMMUNITY): Payer: Self-pay | Admitting: Oncology

## 2016-03-27 VITALS — BP 117/69 | HR 104 | Temp 97.8°F | Resp 18 | Wt 146.0 lb

## 2016-03-27 DIAGNOSIS — R079 Chest pain, unspecified: Secondary | ICD-10-CM

## 2016-03-27 DIAGNOSIS — T66XXXA Radiation sickness, unspecified, initial encounter: Secondary | ICD-10-CM

## 2016-03-27 DIAGNOSIS — R12 Heartburn: Secondary | ICD-10-CM

## 2016-03-27 DIAGNOSIS — C787 Secondary malignant neoplasm of liver and intrahepatic bile duct: Secondary | ICD-10-CM

## 2016-03-27 DIAGNOSIS — C50912 Malignant neoplasm of unspecified site of left female breast: Secondary | ICD-10-CM | POA: Insufficient documentation

## 2016-03-27 DIAGNOSIS — C50911 Malignant neoplasm of unspecified site of right female breast: Secondary | ICD-10-CM | POA: Diagnosis not present

## 2016-03-27 DIAGNOSIS — R252 Cramp and spasm: Secondary | ICD-10-CM

## 2016-03-27 DIAGNOSIS — C7951 Secondary malignant neoplasm of bone: Secondary | ICD-10-CM

## 2016-03-27 DIAGNOSIS — C773 Secondary and unspecified malignant neoplasm of axilla and upper limb lymph nodes: Secondary | ICD-10-CM

## 2016-03-27 DIAGNOSIS — Z95828 Presence of other vascular implants and grafts: Secondary | ICD-10-CM

## 2016-03-27 DIAGNOSIS — K208 Other esophagitis: Secondary | ICD-10-CM

## 2016-03-27 LAB — COMPREHENSIVE METABOLIC PANEL
ALK PHOS: 189 U/L — AB (ref 38–126)
ALT: 23 U/L (ref 14–54)
ANION GAP: 12 (ref 5–15)
AST: 23 U/L (ref 15–41)
Albumin: 3.4 g/dL — ABNORMAL LOW (ref 3.5–5.0)
BILIRUBIN TOTAL: 0.4 mg/dL (ref 0.3–1.2)
BUN: 18 mg/dL (ref 6–20)
CALCIUM: 8.6 mg/dL — AB (ref 8.9–10.3)
CO2: 22 mmol/L (ref 22–32)
Chloride: 101 mmol/L (ref 101–111)
Creatinine, Ser: 0.56 mg/dL (ref 0.44–1.00)
GFR calc non Af Amer: >60 mL/min (ref >60)
Glucose, Bld: 173 mg/dL — ABNORMAL HIGH (ref 65–99)
POTASSIUM: 3.9 mmol/L (ref 3.5–5.1)
SODIUM: 135 mmol/L (ref 135–145)
TOTAL PROTEIN: 7 g/dL (ref 6.5–8.1)

## 2016-03-27 LAB — CBC WITH DIFFERENTIAL/PLATELET
BASOS PCT: 0 %
Basophils Absolute: 0 10*3/uL (ref 0.0–0.1)
EOS ABS: 0 10*3/uL (ref 0.0–0.7)
Eosinophils Relative: 0 %
HEMATOCRIT: 35.8 % — AB (ref 36.0–46.0)
HEMOGLOBIN: 11.8 g/dL — AB (ref 12.0–15.0)
LYMPHS PCT: 2 %
Lymphs Abs: 0.5 10*3/uL — ABNORMAL LOW (ref 0.7–4.0)
MCH: 30.6 pg (ref 26.0–34.0)
MCHC: 33 g/dL (ref 30.0–36.0)
MCV: 93 fL (ref 78.0–100.0)
Monocytes Absolute: 1.3 10*3/uL — ABNORMAL HIGH (ref 0.1–1.0)
Monocytes Relative: 5 %
NEUTROS ABS: 24.2 10*3/uL — AB (ref 1.7–7.7)
Neutrophils Relative %: 93 %
Platelets: 483 10*3/uL — ABNORMAL HIGH (ref 150–400)
RBC: 3.85 MIL/uL — ABNORMAL LOW (ref 3.87–5.11)
RDW: 17.3 % — ABNORMAL HIGH (ref 11.5–15.5)
WBC: 26 10*3/uL — ABNORMAL HIGH (ref 4.0–10.5)

## 2016-03-27 MED ORDER — SODIUM CHLORIDE 0.9% FLUSH
10.0000 mL | Freq: Once | INTRAVENOUS | Status: AC
  Administered 2016-03-27: 10 mL via INTRAVENOUS

## 2016-03-27 MED ORDER — SUCRALFATE 1 GM/10ML PO SUSP: 1.0000 g | Freq: Three times a day (TID) | ORAL | 2 refills | Status: DC

## 2016-03-27 MED ORDER — HEPARIN SOD (PORK) LOCK FLUSH 100 UNIT/ML IV SOLN
500.0000 [IU] | Freq: Once | INTRAVENOUS | Status: AC
  Administered 2016-03-27: 500 [IU] via INTRAVENOUS

## 2016-03-27 MED ORDER — HEPARIN SOD (PORK) LOCK FLUSH 100 UNIT/ML IV SOLN
INTRAVENOUS | Status: AC
  Filled 2016-03-27: qty 5

## 2016-03-27 NOTE — Assessment & Plan Note (Addendum)
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  She is now on systemic chemotherapy consisting of Docetaxel/Herceptin/Perjeta with Neulasta support every 21 days with Docetaxel being reduced by 20% for cycle 2 after hospitalization for febrile neutropenia following cycle #1 of therapy.  She is S/P palliative XRT to T7-T11 with Dr. Lianne Cure in [place], [place] from 02/27/2016- 03/09/2016.  Complicated by a LEFT invasive lobular carcinoma, Stage IIB (pT3pN2A), S/P left mastectomy by Dr. Arnoldo Morale on 12/16/2015, ER/PR+ 2%.  HER2 NEGATIVE.  Done prior to knowing about Stage IV disease. AND History with right invasive ductal breast cancer, Stage IIIC, ER/PR+ and HER 2 POSITIVE in 2012 managed with right mastectomy, systemic chemotherapy, HER2 targeted therapy, XRT, and anti-endocrine therapy   Pre-treatment labs: CBC diff, CMET, CA 27.29, and CA15-3.  I personally reviewed and went over laboratory results with the patient.  The results are noted within this dictation.  Lab satisfy treatment parameters today.    Based upon her symptoms, I will defer treatment 1 week.  She is having early signs/symptoms of palmar-plantar erythrodysesthesia likely secondary to docetaxel chemotherapy.  She is advised to continue with good moisturizing lotion of her hands.  Additionally, she notes cramping of her hands and feet.  Her potassium levels within normal limits.  She reports heartburn/throat burning with eating only.  She is on Prilosec 40 mg daily and she will continue with his PPI therapy.  I prescribed Carafate 4 times daily to help with this as well.  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  We will get her set-up for MUGA to evaluate her LVEF given her Herceptin treatment.  Order is placed.  Chart reviewed and hospitalization for febrile neutropenia complicated by COPD exacerbation is appreciated.  Oncology was consulted and saw the patient while in the hospital as well.   Based upon this hospitalization, being heavily pretreated in the past, we will reduce the dose of docetaxel by 20%.  She asks [place] to fill out paperwork to relieve her from work for the next 2 months which is certainly appropriate.  She is considering applying for disability.  This too is appropriate.  Return next week for treatment.  Return in 4 weeks for follow-up for cycle #3.

## 2016-03-27 NOTE — Patient Instructions (Addendum)
[place] at [facility] Discharge Instructions  RECOMMENDATIONS MADE BY THE CONSULTANT AND ANY TEST RESULTS WILL BE SENT TO YOUR REFERRING PHYSICIAN.  You were seen today by Kirby Crigler PA-C. No Treatment today. Rx for carafate QID , continue prilosec daily, use moisturizing lotion on hands. Return next week for treatment, labs and Xgeva injections. Follow up in 4 weeks.    Thank you for choosing [place] at [facility] to provide your oncology and hematology care.  To afford each patient quality time with our provider, please arrive at least 15 minutes before your scheduled appointment time.    If you have a lab appointment with the [place] please come in thru the  Main Entrance and check in at the main information desk  You need to re-schedule your appointment should you arrive 10 or more minutes late.  We strive to give you quality time with our providers, and arriving late affects you and other patients whose appointments are after yours.  Also, if you no show three or more times for appointments you may be dismissed from the clinic at the providers discretion.     Again, thank you for choosing [facility].  Our hope is that these requests will decrease the amount of time that you wait before being seen by our physicians.       _____________________________________________________________  Should you have questions after your visit to [facility], please contact our office at [phone] between the hours of 8:30 a.m. and 4:30 p.m.  Voicemails left after 4:30 p.m. will not be returned until the following business day.  For prescription refill requests, have your pharmacy contact our office.       Resources For Cancer Patients and their Caregivers ? American Cancer Society: Can assist with transportation, wigs, general needs, runs Look Good Feel Better.        651-262-0548 ? Cancer  Care: Provides financial assistance, online support groups, medication/co-pay assistance.  1-800-813-HOPE (519) 492-4384) ? [place] Assists [place] cancer patients and their families through emotional , educational and financial support.  212-221-2664 ? [place] DSS Where to apply for food stamps, Medicaid and utility assistance. (531) 811-1905 ? [facility]: Transportation to medical appointments. 458-353-8707 ? Social Security Administration: May apply for disability if have a Stage IV cancer. (423)588-1653 207 094 9301 ? [employer], Disability and Transit Services: Assists with nutrition, care and transit needs. [place] Support Programs: @10RELATIVEDAYS @ > Cancer Support Group  2nd Tuesday of the month 1pm-2pm, Journey Room  > Creative Journey  3rd Tuesday of the month 1130am-1pm, Journey Room  > Look Good Feel Better  1st Wednesday of the month 10am-12 noon, Journey Room (Call [place] to register [phone])

## 2016-03-27 NOTE — Progress Notes (Signed)
Went to visit pt during doctors visit this morning.  Pt seems tired, but in good spirits.  I faxed a letter to JCR to [employer] and Valetta Fuller at [employer] for her to be out of work for 2 more months.

## 2016-03-27 NOTE — Progress Notes (Signed)
Sheila Dun, FNP [place] [address]  [name], stage 4, left ([place]) - Plan: Cancer antigen 27.29, Cancer antigen 15-3  Radiation esophagitis - Plan: sucralfate (CARAFATE) 1 GM/10ML suspension  CURRENT THERAPY: Docetaxel/Herceptin/Perjeta beginning on 03/06/2016./  Docetaxel dose reduced by 20% beginning on 03/27/2016.  INTERVAL HISTORY: Sheila Garrison 61 y.o. 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 AND Invasive lobular left breast cancer, Stage IIB (pT3pN2A), S/P left mastectomy by Dr. Arnoldo Morale on 12/16/2015, ER/PR+ 2%.  HER2 NEGATIVE.  Done prior to knowing about Stage IV disease. 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 Margot Chimes, systemic chemotherapy by Dr. Jana Hakim in 2012 and Herceptin x 52 weeks finishing on 07/10/2011, XRT (01/04/2011- 02/22/2011), and anti-endocrine therapy.    Stage III (T4N3) R Br cancer    06/21/2010 Initial Diagnosis    Stage III (T4N3) R Br cancer       01/04/2011 - 02/22/2011 Radiation Therapy    50 Gy       05/07/2014 Pathology Results    BCI testing- low risk of late recurrence (3.4% between years 5-10), high likelihood of benefit from extended endocrein therapy, and a 67% relative risk reduction when treated with extended endocrine therapy (27% versus 10.5%).       [name], stage 4, left ([place])   11/22/2015 Mammogram    Ultrasound-guided biopsy of the irregular hypoechoic area, with prominent shadowing, in the OUTER left breast at the 1 o'clock axis. 2. Ultrasound-guided biopsy of the irregular mass within the INNER left breast at the 9 o'clock axis. 3. Ultrasound-guided biopsy of 1 of the enlarged and amorphous lymph nodes in the left axilla.      11/22/2015 Imaging    Ultrasound-guided  biopsy of the irregular hypoechoic area, with prominent shadowing, in the OUTER left breast at the 1 o'clock axis. 2. Ultrasound-guided biopsy of the irregular mass within the INNER left breast at the 9 o'clock axis. 3. Ultrasound-guided biopsy of 1 of the enlarged and amorphous lymph nodes in the left axilla.      12/16/2015 Surgery    L modified radical mastectomy with Dr. Arnoldo Morale      12/18/2015 - 12/21/2015 Hospital Admission    Admit date: 12/18/2015 Admission diagnosis: altered MS, acute respiratory failure Additional comments: This likely was a combination of COPD exacerbation along with mild acute on chronic diastolic CHF with  echo shows a preserved EF of around 60%, he was treated with IV steroids and Lasix, she is much better but qualifies for home oxygen which will be provided      12/19/2015 Imaging    No evidence of pulmonary embolus. 2. Small right pleural effusion, with associated atelectasis. Peripheral scarring at the anterior right upper lobe. Mild bilateral emphysema noted. 3. Scattered coronary artery calcifications seen. 4. Postoperative change at the left chest wall, reflecting recent mastectomy, with vague collections of postoperative fluid seen. 5. Vague sclerotic change and heterogeneity at vertebral body T8 raises concern for sequelae of metastatic disease      01/23/2016 PET scan    1. Widespread multifocal metastatic disease in the visualized axial and appendicular skeleton. 2. Solitary metastatic lesion in the liver. Hypermetabolic irregular left axillary lymph node adjacent to the left axillary clips. 3. Activity in the left breast related to mastectomy.  4. Low-grade activity along the pleuroparenchymal thickening anteriorly in the right chest is probably related to prior therapy rather than current malignancy. 5. Probable mild compression fracture at T8 associated with the extensive tumor at this level. There is also bony destruction of the right  ninth rib posteriorly. 6. Faintly hypermetabolic but small left external iliac lymph nodes merit surveillance  ADDENDUM: The original report was by Dr. Van Clines. The following addendum is by Dr. Van Clines:  I have one other musculoskeletal focus of hypermetabolic activity to mention. In the right latissimus dorsi muscle, there is a small hypermetabolic focus with maximum SUV of 5.4, measuring 7 mm in diameter on image 89/4, compatible with a metastatic deposit.        02/02/2016 Imaging    MRI T-spine: 1. Extensive metastatic disease throughout the cervical and thoracic spine with a subtle pathologic compression fracture of T8. Tumor extends through the posterior margin of the T8 vertebra into the spinal canal without spinal cord compression at this time. 2. No other pathologic fractures of the thoracic spine at this time.      02/10/2016 Procedure    US biopsy of liver lesion      02/15/2016 Pathology Results    Liver, needle/core biopsy, right lobe METASTATIC CARCINOMA, CONSISTENT WITH BREAST DUCTAL CARCINOMA.      02/15/2016 - 02/28/2016 Chemotherapy    Letrozole 2.5 mg daily + Kisqali (600 mg) days 1-21 every 28 days       02/21/2016 Pathology Results    Breast Prognostic profile from liver biopsy: HER2 POSITIVE, ER/PR NEGATIVE (0%).      02/27/2016 - 03/09/2016 Radiation Therapy    Palliative XRT to T7-T11 with Dr. Lianne Cure; 30 Gy in 10 fractions.       02/28/2016 Treatment Plan Change    Due to HER2 positivity and ER/PR negativity from liver biopsy, change to systemic chemotherapy is recommended.      03/06/2016 -  Chemotherapy    Docetaxel/Herceptin/Perjeta with Neulasta support.      03/11/2016 - 03/15/2016 Hospital Admission    Admit date: 03/11/2016 Admission diagnosis: Febrile neutropenia Additional comments: COPD exacerbation      03/27/2016 Treatment Plan Change    Docetaxel dose reduced by 20%.      03/27/2016 Adverse Reaction     Demonstrating early signs of palmar-plantar erythrodysesthesia.      03/27/2016 Treatment Plan Change    Defer treatment 1 week.      Chart is reviewed and hospitalization for febrile neutropenia is appreciated.  The patient was seen as an inpatient by oncology and followed along.  She reports that she feels much improved over the past week but still below baseline.  She notes chest pain with swallowing.  She describes it as burning.  She denies any radiation of this discomfort anywhere else.  She denies any exertional chest pain.  She denies any shortness of breath or dyspnea aphasia.  She is on omeprazole 40 mg daily.  She notes cramping of her hands.  She has some nontender erythema pounds of her hands with some skin peeling as well.  She denies any lesions of her hands.  She notes that her/soles of feet are completely normal.  Review of Systems  Constitutional: Positive for weight loss. Negative for chills and fever.  HENT: Negative.   Eyes: Negative.   Respiratory: Negative.  Negative for cough.   Cardiovascular: Positive for chest pain (With swallowing only).  Gastrointestinal: Negative.  Negative for blood in stool, constipation,  diarrhea, melena, nausea and vomiting.  Genitourinary: Negative.   Musculoskeletal: Negative.   Skin: Negative.        Redness and peeling of palms bilaterally.  Neurological: Negative.  Negative for weakness.  Endo/Heme/Allergies: Negative.   Psychiatric/Behavioral: Negative.     Past Medical History:  Diagnosis Date  . Anxiety   . Bone metastases ([place]) 02/15/2016  . Breast CA ([place]) 07/01/2010   rt breast ca  . COPD (chronic obstructive pulmonary disease) ([place])   . Family history of breast cancer    Aunt on father's side  . Hyperlipemia   . Hypertension   . MRSA (methicillin resistant staph aureus) culture positive    in breast  . Neuropathy ([place])    bilateral toes  . Nipple discharge    with pain, infection, lump  . S/P radiation  therapy 01/08/11 - 02/22/11   Right Chest Wall/ 50 Gy / 25 Fractions, Right [place]/ 50 gy/25 Fractions, Right Axillary Boost/500 cGy/25 Fractions, Right Chest Wall Boost/10 Gy/5 Fractions  . Stage III (T4N3) R Br cancer  06/21/2010   Letrozole 2.5 mg which she started on 03/09/2011.  This patient presented to my office on Jun 21, 2010 with a large ulcerated right breast mass and bleeding. She underwent additional workup at that time and a biopsy was done showing invasive breast cancer. She was staged and was found to have a T4  disease that was ER, PR, HER-2 positive. CA 2729 was normal at 31. She is undergoing chemotherapy wi  . Status post chemotherapy    6 cycles of carboplatin and docetaxel with trastuzumab every 21 days followed by surgery  . Tobacco abuse 06/22/2015  . Use of letrozole ([place]) 03/09/2011  . Wears dentures     Past Surgical History:  Procedure Laterality Date  . BREAST SURGERY  2012  . DILATION AND CURETTAGE OF UTERUS    . MASTECTOMY MODIFIED RADICAL Left 12/16/2015   Procedure: MASTECTOMY MODIFIED RADICAL;  Surgeon: Aviva Signs, MD;  Location: [facility];  Service: General;  Laterality: Left;  . port a catheter insertion    . PORT-A-CATH REMOVAL Left 01/15/2013   Procedure: REMOVAL PORT-A-CATH;  Surgeon: Scherry Ran, MD;  Location: [facility];  Service: General;  Laterality: Left;  . PORTACATH PLACEMENT Right 01/16/2016   Procedure: INSERTION PORT-A-CATH;  Surgeon: Aviva Signs, MD;  Location: [facility];  Service: General;  Laterality: Right;  . TUBAL LIGATION      Family History  Problem Relation Age of Onset  . COPD Mother   . Diabetes Father   . Hypertension Father   . COPD Brother   . Cancer Maternal Aunt     ovarian - died of old age  . Cancer Paternal Aunt     had breast ca; died of of a different cancer  . Lung cancer Brother     Social History   Social History  . Marital status: Married    Spouse name: N/A  . Number of children: N/A  . Years of  education: N/A   Social History Main Topics  . Smoking status: Former Smoker    Packs/day: 0.25    Years: 30.00    Types: Cigarettes    Quit date: 12/15/2015  . Smokeless tobacco: Never Used  . Alcohol use No  . Drug use: No  . Sexual activity: Yes    Birth control/ protection: Post-menopausal   Other Topics Concern  . None   Social History Narrative  . None  PHYSICAL EXAMINATION  ECOG PERFORMANCE STATUS: 1 - Symptomatic but completely ambulatory  There were no vitals filed for this visit.  Vitals - 1 value per visit 04/28/5571  SYSTOLIC 220  DIASTOLIC 69  Pulse 254  Temperature 97.8  Respirations 18  Weight (lb) 146    GENERAL:alert, no distress, well nourished, well developed, comfortable, cooperative, smiling and accompanied by friend. SKIN: skin color, texture, turgor are normal, no rashes or significant lesions HEAD: Normocephalic, No masses, lesions, tenderness or abnormalities EYES: normal, EOMI, Conjunctiva are pink and non-injected EARS: External ears normal OROPHARYNX:lips, buccal mucosa, and tongue normal and mucous membranes are moist  NECK: supple, no adenopathy, trachea midline LYMPH:  no palpable lymphadenopathy BREAST:not examined LUNGS: clear to auscultation , decreased breath sounds bilaterally HEART: regular rate & rhythm, no murmurs and no gallops ABDOMEN:abdomen soft and normal bowel sounds BACK: Back symmetric, no curvature. EXTREMITIES:less then 2 second capillary refill, no joint deformities, effusion, or inflammation, no skin discoloration, no cyanosis  NEURO: alert & oriented x 3 with fluent speech, no focal motor/sensory deficits, gait normal   LABORATORY DATA: CBC    Component Value Date/Time   WBC 26.0 (H) 03/27/2016 0904   RBC 3.85 (L) 03/27/2016 0904   HGB 11.8 (L) 03/27/2016 0904   HGB 10.1 (L) 11/07/2010 0917   HCT 35.8 (L) 03/27/2016 0904   HCT 41.3 12/27/2015 1553   HCT 31.1 (L) 11/07/2010 0917   PLT 483 (H) 03/27/2016  0904   PLT 406 (H) 12/27/2015 1553   MCV 93.0 03/27/2016 0904   MCV 92 12/27/2015 1553   MCV 100.3 11/07/2010 0917   MCH 30.6 03/27/2016 0904   MCHC 33.0 03/27/2016 0904   RDW 17.3 (H) 03/27/2016 0904   RDW 14.8 12/27/2015 1553   RDW 18.3 (H) 11/07/2010 0917   LYMPHSABS 0.5 (L) 03/27/2016 0904   LYMPHSABS 1.9 12/27/2015 1553   LYMPHSABS 1.6 11/07/2010 0917   MONOABS 1.3 (H) 03/27/2016 0904   MONOABS 1.4 (H) 11/07/2010 0917   EOSABS 0.0 03/27/2016 0904   EOSABS 0.2 12/27/2015 1553   BASOSABS 0.0 03/27/2016 0904   BASOSABS 0.0 12/27/2015 1553   BASOSABS 0.0 11/07/2010 0917      Chemistry      Component Value Date/Time   NA 135 03/27/2016 0904   NA 140 12/27/2015 1553   K 3.9 03/27/2016 0904   CL 101 03/27/2016 0904   CO2 22 03/27/2016 0904   BUN 18 03/27/2016 0904   BUN 22 12/27/2015 1553   CREATININE 0.56 03/27/2016 0904      Component Value Date/Time   CALCIUM 8.6 (L) 03/27/2016 0904   ALKPHOS 189 (H) 03/27/2016 0904   AST 23 03/27/2016 0904   ALT 23 03/27/2016 0904   BILITOT 0.4 03/27/2016 0904   BILITOT 0.2 10/07/2015 0859        PENDING LABS:   RADIOGRAPHIC STUDIES:  Dg Chest 2 View  Result Date: 03/11/2016 CLINICAL DATA:  Fevers EXAM: CHEST  2 VIEW COMPARISON:  01/16/2016, 02/20/2016 FINDINGS: Right chest wall port is again noted. Cardiac shadow is stable. Mild interstitial changes are again seen and stable. Lungs are hyperinflated consistent with COPD. Postsurgical changes are noted in the axilla bilaterally consistent the given clinical history. Some fullness is noted in right hilar region which was not present on the prior exam and may be projectional in nature. Mild density is noted in the right upper lobe secondary to pleural disease anteriorly on the right. Stable sclerosis of the T8 vertebra is  noted consistent with bony metastatic disease. IMPRESSION: COPD. Bony metastatic disease. Electronically Signed   By: Inez Catalina M.D.   On: 03/11/2016 19:50    Ct Angio Chest Pe W Or Wo Contrast  Result Date: 03/14/2016 CLINICAL DATA:  Shortness of breath. EXAM: CT ANGIOGRAPHY CHEST WITH CONTRAST TECHNIQUE: Multidetector CT imaging of the chest was performed using the standard protocol during bolus administration of intravenous contrast. Multiplanar CT image reconstructions and MIPs were obtained to evaluate the vascular anatomy. CONTRAST:  100 mL of Isovue 370 intravenously. COMPARISON:  CT scan of December 18, 2015. PET scan of January 23, 2016. FINDINGS: Cardiovascular: Atherosclerosis of thoracic aorta is noted without aneurysm or dissection. There is no evidence of pulmonary embolus. Normal cardiac size. Right subclavian Port-A-Cath is noted with distal tip in SVC. Mediastinum/Nodes: No enlarged mediastinal, hilar, or axillary lymph nodes. Thyroid gland, trachea, and esophagus demonstrate no significant findings. Lungs/Pleura: Mild right pleural effusion is noted with adjacent subsegmental atelectasis. Stable scarring is noted anteriorly in the right upper lobe. No pneumothorax is noted. Right apical scarring is noted. Upper Abdomen: No acute abnormality. Musculoskeletal: There is continued presence of T8 metastatic lesion as identified on prior exam. There is interval development of metastatic lesion at T10 appear continued presence of right ninth rib metastatic lesion is noted as well. Review of the MIP images confirms the above findings. IMPRESSION: Aortic atherosclerosis. No definite evidence of pulmonary embolus. Mild right pleural effusion is noted with adjacent subsegmental atelectasis. Metastatic lesions are noted in T8 and T10 vertebral bodies, as well as in right ninth rib. Electronically Signed   By: Marijo Conception, M.D.   On: 03/14/2016 19:23   Dg Chest Port 1 View  Result Date: 03/13/2016 CLINICAL DATA:  Shortness of breath and weakness EXAM: PORTABLE CHEST 1 VIEW COMPARISON:  03/11/2016 FINDINGS: Right chest wall port is again seen.  Hyperinflation is again noted consistent with COPD. The fullness in the right hilum is again identified and stable. This is again likely related to projection and vascularity given the findings of recent stimulation study. Minimal right pleural fluid is noted stable from the prior exam. No new focal abnormality is seen. IMPRESSION: Stable appearance of the chest. Electronically Signed   By: Inez Catalina M.D.   On: 03/13/2016 17:09     PATHOLOGY:    ASSESSMENT AND PLAN:  [name], stage 4, left ([facility]) Stage IV (pT3pN2ApM1) invasive ductal carcinoma, biopsy proven to liver, HER2 POSITIVE, ER/PR NEGATIVE.  She is now on systemic chemotherapy consisting of Docetaxel/Herceptin/Perjeta with Neulasta support every 21 days with Docetaxel being reduced by 20% for cycle 2 after hospitalization for febrile neutropenia following cycle #1 of therapy.  She is S/P palliative XRT to T7-T11 with Dr. Lianne Cure in [place], [place] from 02/27/2016- 03/09/2016.  Complicated by a LEFT invasive lobular carcinoma, Stage IIB (pT3pN2A), S/P left mastectomy by Dr. Arnoldo Morale on 12/16/2015, ER/PR+ 2%.  HER2 NEGATIVE.  Done prior to knowing about Stage IV disease. AND History with right invasive ductal breast cancer, Stage IIIC, ER/PR+ and HER 2 POSITIVE in 2012 managed with right mastectomy, systemic chemotherapy, HER2 targeted therapy, XRT, and anti-endocrine therapy   Pre-treatment labs: CBC diff, CMET, CA 27.29, and CA15-3.  I personally reviewed and went over laboratory results with the patient.  The results are noted within this dictation.  Lab satisfy treatment parameters today.    Based upon her symptoms, I will defer treatment 1 week.  She is having early signs/symptoms  of palmar-plantar erythrodysesthesia likely secondary to docetaxel chemotherapy.  She is advised to continue with good moisturizing lotion of her hands.  Additionally, she notes cramping of her hands and feet.  Her potassium levels within  normal limits.  She reports heartburn/throat burning with eating only.  She is on Prilosec 40 mg daily and she will continue with his PPI therapy.  I prescribed Carafate 4 times daily to help with this as well.  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  We will get her set-up for MUGA to evaluate her LVEF given her Herceptin treatment.  Order is placed.  Chart reviewed and hospitalization for febrile neutropenia complicated by COPD exacerbation is appreciated.  Oncology was consulted and saw the patient while in the hospital as well.  Based upon this hospitalization, being heavily pretreated in the past, we will reduce the dose of docetaxel by 20%.  She asks [place] to fill out paperwork to relieve her from work for the next 2 months which is certainly appropriate.  She is considering applying for disability.  This too is appropriate.  Return next week for treatment.  Return in 4 weeks for follow-up for cycle #3.   ORDERS PLACED FOR THIS ENCOUNTER: Orders Placed This Encounter  Procedures  . Cancer antigen 27.29  . Cancer antigen 15-3    MEDICATIONS PRESCRIBED THIS ENCOUNTER: Meds ordered this encounter  Medications  . sucralfate (CARAFATE) 1 GM/10ML suspension    Sig: Take 10 mLs (1 g total) by mouth 4 (four) times daily -  with meals and at bedtime.    Dispense:  420 mL    Refill:  2    Order Specific Question:   Supervising Provider    Answer:   Brunetta Genera [[id number]]    THERAPY PLAN:  Hold treatment 1 week.  Continue with palliative treatment as outlined above.  All questions were answered. The patient knows to call the clinic with any problems, questions or concerns. We can certainly see the patient much sooner if necessary.  Patient and plan discussed with Dr. Twana First and she is in agreement with the aforementioned.   This note is electronically signed by: Doy Mince 03/27/2016 10:11 AM

## 2016-03-27 NOTE — Progress Notes (Signed)
Chemo deferred x 1 week. Olen Pel presented for Portacath access and flush. Proper placement of portacath confirmed by CXR. Portacath located right chest wall accessed with  H 20 needle. Good blood return present. Portacath flushed with 84ml NS and 500U/87ml Heparin and needle removed intact. Procedure without incident. Patient tolerated procedure well.

## 2016-03-28 LAB — CANCER ANTIGEN 27.29: CA 27.29: 54.7 U/mL — ABNORMAL HIGH (ref 0.0–38.6)

## 2016-03-28 LAB — CANCER ANTIGEN 15-3: CA 15-3: 52.3 U/mL — ABNORMAL HIGH (ref 0.0–25.0)

## 2016-04-03 ENCOUNTER — Encounter (HOSPITAL_BASED_OUTPATIENT_CLINIC_OR_DEPARTMENT_OTHER): Payer: PRIVATE HEALTH INSURANCE

## 2016-04-03 VITALS — BP 129/75 | HR 72 | Temp 97.4°F | Resp 18 | Wt 147.0 lb

## 2016-04-03 DIAGNOSIS — C773 Secondary and unspecified malignant neoplasm of axilla and upper limb lymph nodes: Secondary | ICD-10-CM

## 2016-04-03 DIAGNOSIS — C7951 Secondary malignant neoplasm of bone: Secondary | ICD-10-CM

## 2016-04-03 DIAGNOSIS — C787 Secondary malignant neoplasm of liver and intrahepatic bile duct: Secondary | ICD-10-CM

## 2016-04-03 DIAGNOSIS — Z5112 Encounter for antineoplastic immunotherapy: Secondary | ICD-10-CM | POA: Diagnosis not present

## 2016-04-03 DIAGNOSIS — C50911 Malignant neoplasm of unspecified site of right female breast: Secondary | ICD-10-CM

## 2016-04-03 DIAGNOSIS — C50912 Malignant neoplasm of unspecified site of left female breast: Secondary | ICD-10-CM

## 2016-04-03 DIAGNOSIS — Z5111 Encounter for antineoplastic chemotherapy: Secondary | ICD-10-CM

## 2016-04-03 DIAGNOSIS — K1231 Oral mucositis (ulcerative) due to antineoplastic therapy: Secondary | ICD-10-CM

## 2016-04-03 LAB — CBC WITH DIFFERENTIAL/PLATELET
Basophils Absolute: 0 10*3/uL (ref 0.0–0.1)
Basophils Relative: 0 %
EOS ABS: 0 10*3/uL (ref 0.0–0.7)
EOS PCT: 0 %
HCT: 35.3 % — ABNORMAL LOW (ref 36.0–46.0)
HEMOGLOBIN: 11.7 g/dL — AB (ref 12.0–15.0)
LYMPHS PCT: 2 %
Lymphs Abs: 0.4 10*3/uL — ABNORMAL LOW (ref 0.7–4.0)
MCH: 30.5 pg (ref 26.0–34.0)
MCHC: 33.1 g/dL (ref 30.0–36.0)
MCV: 92.2 fL (ref 78.0–100.0)
MONOS PCT: 3 %
Monocytes Absolute: 0.7 10*3/uL (ref 0.1–1.0)
NEUTROS PCT: 95 %
Neutro Abs: 21.4 10*3/uL — ABNORMAL HIGH (ref 1.7–7.7)
Platelets: 293 10*3/uL (ref 150–400)
RBC: 3.83 MIL/uL — AB (ref 3.87–5.11)
RDW: 17.1 % — ABNORMAL HIGH (ref 11.5–15.5)
WBC: 22.5 10*3/uL — AB (ref 4.0–10.5)

## 2016-04-03 LAB — COMPREHENSIVE METABOLIC PANEL
ALK PHOS: 191 U/L — AB (ref 38–126)
ALT: 23 U/L (ref 14–54)
ANION GAP: 10 (ref 5–15)
AST: 23 U/L (ref 15–41)
Albumin: 3.3 g/dL — ABNORMAL LOW (ref 3.5–5.0)
BUN: 11 mg/dL (ref 6–20)
CALCIUM: 8.2 mg/dL — AB (ref 8.9–10.3)
CO2: 25 mmol/L (ref 22–32)
CREATININE: 0.58 mg/dL (ref 0.44–1.00)
Chloride: 102 mmol/L (ref 101–111)
Glucose, Bld: 214 mg/dL — ABNORMAL HIGH (ref 65–99)
Potassium: 3.4 mmol/L — ABNORMAL LOW (ref 3.5–5.1)
SODIUM: 137 mmol/L (ref 135–145)
TOTAL PROTEIN: 6.5 g/dL (ref 6.5–8.1)
Total Bilirubin: 0.3 mg/dL (ref 0.3–1.2)

## 2016-04-03 MED ORDER — DEXAMETHASONE SODIUM PHOSPHATE 10 MG/ML IJ SOLN
INTRAMUSCULAR | Status: AC
  Filled 2016-04-03: qty 1

## 2016-04-03 MED ORDER — MAGIC MOUTHWASH W/LIDOCAINE: 5.0000 mL | Freq: Four times a day (QID) | ORAL | 0 refills | Status: DC | PRN

## 2016-04-03 MED ORDER — DIPHENHYDRAMINE HCL 25 MG PO CAPS
50.0000 mg | ORAL_CAPSULE | Freq: Once | ORAL | Status: AC
  Administered 2016-04-03: 50 mg via ORAL

## 2016-04-03 MED ORDER — HEPARIN SOD (PORK) LOCK FLUSH 100 UNIT/ML IV SOLN
500.0000 [IU] | Freq: Once | INTRAVENOUS | Status: AC | PRN
  Administered 2016-04-03: 500 [IU]
  Filled 2016-04-03 (×2): qty 5

## 2016-04-03 MED ORDER — DOCETAXEL CHEMO INJECTION 160 MG/16ML
60.0000 mg/m2 | Freq: Once | INTRAVENOUS | Status: AC
  Administered 2016-04-03: 110 mg via INTRAVENOUS
  Filled 2016-04-03: qty 11

## 2016-04-03 MED ORDER — SODIUM CHLORIDE 0.9 % IV SOLN
6.0000 mg/kg | Freq: Once | INTRAVENOUS | Status: AC
  Administered 2016-04-03: 420 mg via INTRAVENOUS
  Filled 2016-04-03: qty 20

## 2016-04-03 MED ORDER — ACETAMINOPHEN 325 MG PO TABS
650.0000 mg | ORAL_TABLET | Freq: Once | ORAL | Status: AC
  Administered 2016-04-03: 650 mg via ORAL

## 2016-04-03 MED ORDER — ACETAMINOPHEN 325 MG PO TABS
ORAL_TABLET | ORAL | Status: AC
  Filled 2016-04-03: qty 2

## 2016-04-03 MED ORDER — SODIUM CHLORIDE 0.9 % IV SOLN
Freq: Once | INTRAVENOUS | Status: AC
  Administered 2016-04-03: 11:00:00 via INTRAVENOUS

## 2016-04-03 MED ORDER — DEXAMETHASONE SODIUM PHOSPHATE 10 MG/ML IJ SOLN
10.0000 mg | Freq: Once | INTRAMUSCULAR | Status: AC
  Administered 2016-04-03: 10 mg via INTRAVENOUS

## 2016-04-03 MED ORDER — SODIUM CHLORIDE 0.9 % IV SOLN: Freq: Once | INTRAVENOUS | Status: DC

## 2016-04-03 MED ORDER — SODIUM CHLORIDE 0.9 % IV SOLN
420.0000 mg | Freq: Once | INTRAVENOUS | Status: AC
  Administered 2016-04-03: 420 mg via INTRAVENOUS
  Filled 2016-04-03: qty 14

## 2016-04-03 MED ORDER — PEGFILGRASTIM 6 MG/0.6ML ~~LOC~~ PSKT
6.0000 mg | PREFILLED_SYRINGE | Freq: Once | SUBCUTANEOUS | Status: AC
  Administered 2016-04-03: 6 mg via SUBCUTANEOUS
  Filled 2016-04-03: qty 0.6

## 2016-04-03 MED ORDER — DIPHENHYDRAMINE HCL 25 MG PO CAPS
ORAL_CAPSULE | ORAL | Status: AC
  Filled 2016-04-03: qty 2

## 2016-04-03 NOTE — Patient Instructions (Signed)
[facility] Discharge Instructions for Patients Receiving Chemotherapy   Beginning January 23rd 2017 lab work for the [facility] will be done in the  Main lab at [facility] on 1st floor. If you have a lab appointment with the [facility] please come in thru the  Main Entrance and check in at the main information desk   Today you received the following chemotherapy agents Taxotere, herceptin and perjeta. Neulasta device will dispense medication between 330 pm and 430 pm tomorrow. You may take device off at 5 pm tomorrow Wed Feb 28.  To help prevent nausea and vomiting after your treatment, we encourage you to take your nausea medication as instructed.   If you develop nausea and vomiting, or diarrhea that is not controlled by your medication, call the clinic.  The clinic phone number is [phone]. Office hours are Monday-Friday 8:30am-5:00pm.  BELOW ARE SYMPTOMS THAT SHOULD BE REPORTED IMMEDIATELY:  *FEVER GREATER THAN 101.0 F  *CHILLS WITH OR WITHOUT FEVER  NAUSEA AND VOMITING THAT IS NOT CONTROLLED WITH YOUR NAUSEA MEDICATION  *UNUSUAL SHORTNESS OF BREATH  *UNUSUAL BRUISING OR BLEEDING  TENDERNESS IN MOUTH AND THROAT WITH OR WITHOUT PRESENCE OF ULCERS  *URINARY PROBLEMS  *BOWEL PROBLEMS  UNUSUAL RASH Items with * indicate a potential emergency and should be followed up as soon as possible. If you have an emergency after office hours please contact your primary care physician or go to the nearest emergency department.  Please call the clinic during office hours if you have any questions or concerns.   You may also contact the Patient Navigator at [phone] should you have any questions or need assistance in obtaining follow up care.      Resources For Cancer Patients and their Caregivers ? American Cancer Society: Can assist with transportation, wigs, general needs, runs Look Good Feel Better.        (709)886-7577 ? Cancer  Care: Provides financial assistance, online support groups, medication/co-pay assistance.  1-800-813-HOPE 9862466512) ? [facility] Assists [place] cancer patients and their families through emotional , educational and financial support.  (708) 427-2797 ? [place] DSS Where to apply for food stamps, Medicaid and utility assistance. 978-068-3201 ? [facility]: Transportation to medical appointments. 236 370 3824 ? Social Security Administration: May apply for disability if have a Stage IV cancer. 260-646-4203 (445)195-3777 ? [employer], Disability and Transit Services: Assists with nutrition, care and transit needs. 9255610988

## 2016-04-03 NOTE — Progress Notes (Signed)
No xgeva today per Dr.Zhou. Instruct patient to resume calcium supplements and to use ES Tums 2/daily. Informed patient she is to RTC in 1 week for Calcium recheck and xgeva as indicated. Patient request/declined RTC in 1 week for xgeva, would rather wait until next chemo treatment. Tolerated chemo well. Marland KitchenOlen Garrison arrived today for [facility] neulasta on body injector. See MAR for administration details. Injector in place and engaged with green light indicator on flashing. Tolerated application with out problems. Stable and ambulatory on discharge home with friend.

## 2016-04-04 ENCOUNTER — Ambulatory Visit (HOSPITAL_COMMUNITY): Payer: PRIVATE HEALTH INSURANCE

## 2016-04-04 ENCOUNTER — Encounter (HOSPITAL_COMMUNITY): Payer: PRIVATE HEALTH INSURANCE

## 2016-04-10 ENCOUNTER — Encounter (HOSPITAL_COMMUNITY)
Admission: RE | Admit: 2016-04-10 | Discharge: 2016-04-10 | Disposition: A | Discharge status: Home / Self Care | Payer: PRIVATE HEALTH INSURANCE | Source: Ambulatory Visit | Attending: Oncology | Admitting: Oncology

## 2016-04-10 ENCOUNTER — Encounter (HOSPITAL_COMMUNITY): Payer: Self-pay

## 2016-04-10 DIAGNOSIS — C50912 Malignant neoplasm of unspecified site of left female breast: Secondary | ICD-10-CM | POA: Insufficient documentation

## 2016-04-10 MED ORDER — TECHNETIUM TC 99M-LABELED RED BLOOD CELLS IV KIT
20.0000 | PACK | Freq: Once | INTRAVENOUS | Status: AC | PRN
  Administered 2016-04-10: 19.03 via INTRAVENOUS

## 2016-04-10 MED ORDER — HEPARIN SOD (PORK) LOCK FLUSH 100 UNIT/ML IV SOLN
INTRAVENOUS | Status: AC
  Filled 2016-04-10: qty 5

## 2016-04-23 NOTE — Assessment & Plan Note (Addendum)
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  She is now on systemic chemotherapy consisting of Docetaxel/Herceptin/Perjeta with Neulasta support every 21 days with Docetaxel being reduced by 20% for cycle 2 after hospitalization for febrile neutropenia following cycle #1 of therapy.  She is S/P palliative XRT to T7-T11 with Dr. Lianne Cure in [place], [place] from 02/27/2016- 03/09/2016.  Complicated by a LEFT invasive lobular carcinoma, Stage IIB (pT3pN2A), S/P left mastectomy by Dr. Arnoldo Morale on 12/16/2015, ER/PR+ 2%.  HER2 NEGATIVE.  Done prior to knowing about Stage IV disease. AND History with right invasive ductal breast cancer, Stage IIIC, ER/PR+ and HER 2 POSITIVE in 2012 managed with right mastectomy, systemic chemotherapy, HER2 targeted therapy, XRT, and anti-endocrine therapy   Oncology history is updated.  Pre-treatment labs: CBC diff, CMET, CA 27.29, and CA15-3.  I personally reviewed and went over laboratory results with the patient.  The results are noted within this dictation.  Lab satisfy treatment parameters today.    She tolerated her last treatment well without any major side effects from chemotherapy.  Dose reduction of docetaxel is been very helpful thus far.  I have recommended water with quinine for her cramping.  She will continue with K+ supplementation.  Her SOB resolves with Albuterol.  She will continue with this.  She denies any chest pain.  Return in 3 weeks for follow-up for cycle #4.

## 2016-04-24 ENCOUNTER — Encounter (HOSPITAL_BASED_OUTPATIENT_CLINIC_OR_DEPARTMENT_OTHER): Payer: PRIVATE HEALTH INSURANCE

## 2016-04-24 ENCOUNTER — Encounter (HOSPITAL_COMMUNITY): Payer: PRIVATE HEALTH INSURANCE | Attending: Hematology & Oncology | Admitting: Oncology

## 2016-04-24 ENCOUNTER — Encounter (HOSPITAL_COMMUNITY): Payer: Self-pay | Admitting: Oncology

## 2016-04-24 VITALS — BP 112/68 | HR 84 | Temp 97.8°F | Resp 20

## 2016-04-24 DIAGNOSIS — G620 Drug-induced polyneuropathy: Secondary | ICD-10-CM

## 2016-04-24 DIAGNOSIS — C787 Secondary malignant neoplasm of liver and intrahepatic bile duct: Secondary | ICD-10-CM

## 2016-04-24 DIAGNOSIS — C50912 Malignant neoplasm of unspecified site of left female breast: Secondary | ICD-10-CM

## 2016-04-24 DIAGNOSIS — C773 Secondary and unspecified malignant neoplasm of axilla and upper limb lymph nodes: Secondary | ICD-10-CM

## 2016-04-24 DIAGNOSIS — C50911 Malignant neoplasm of unspecified site of right female breast: Secondary | ICD-10-CM | POA: Diagnosis not present

## 2016-04-24 DIAGNOSIS — C7951 Secondary malignant neoplasm of bone: Secondary | ICD-10-CM | POA: Insufficient documentation

## 2016-04-24 DIAGNOSIS — C50919 Malignant neoplasm of unspecified site of unspecified female breast: Secondary | ICD-10-CM | POA: Insufficient documentation

## 2016-04-24 DIAGNOSIS — Z5111 Encounter for antineoplastic chemotherapy: Secondary | ICD-10-CM

## 2016-04-24 DIAGNOSIS — Z5112 Encounter for antineoplastic immunotherapy: Secondary | ICD-10-CM

## 2016-04-24 LAB — CBC WITH DIFFERENTIAL/PLATELET
BASOS PCT: 0 %
Basophils Absolute: 0 10*3/uL (ref 0.0–0.1)
EOS ABS: 0 10*3/uL (ref 0.0–0.7)
Eosinophils Relative: 0 %
HCT: 34.3 % — ABNORMAL LOW (ref 36.0–46.0)
Hemoglobin: 11.1 g/dL — ABNORMAL LOW (ref 12.0–15.0)
LYMPHS ABS: 0.3 10*3/uL — AB (ref 0.7–4.0)
Lymphocytes Relative: 2 %
MCH: 30.2 pg (ref 26.0–34.0)
MCHC: 32.4 g/dL (ref 30.0–36.0)
MCV: 93.2 fL (ref 78.0–100.0)
MONO ABS: 0.7 10*3/uL (ref 0.1–1.0)
MONOS PCT: 4 %
Neutro Abs: 16.8 10*3/uL — ABNORMAL HIGH (ref 1.7–7.7)
Neutrophils Relative %: 94 %
Platelets: 375 10*3/uL (ref 150–400)
RBC: 3.68 MIL/uL — ABNORMAL LOW (ref 3.87–5.11)
RDW: 17.1 % — AB (ref 11.5–15.5)
WBC: 17.7 10*3/uL — ABNORMAL HIGH (ref 4.0–10.5)

## 2016-04-24 LAB — COMPREHENSIVE METABOLIC PANEL
ALBUMIN: 3.1 g/dL — AB (ref 3.5–5.0)
ALT: 18 U/L (ref 14–54)
AST: 20 U/L (ref 15–41)
Alkaline Phosphatase: 125 U/L (ref 38–126)
Anion gap: 11 (ref 5–15)
BILIRUBIN TOTAL: 0.2 mg/dL — AB (ref 0.3–1.2)
BUN: 13 mg/dL (ref 6–20)
CHLORIDE: 100 mmol/L — AB (ref 101–111)
CO2: 26 mmol/L (ref 22–32)
Calcium: 7.6 mg/dL — ABNORMAL LOW (ref 8.9–10.3)
Creatinine, Ser: 0.54 mg/dL (ref 0.44–1.00)
GFR calc non Af Amer: >60 mL/min (ref >60)
GLUCOSE: 234 mg/dL — AB (ref 65–99)
POTASSIUM: 3.9 mmol/L (ref 3.5–5.1)
SODIUM: 137 mmol/L (ref 135–145)
Total Protein: 6.6 g/dL (ref 6.5–8.1)

## 2016-04-24 MED ORDER — SODIUM CHLORIDE 0.9 % IV SOLN
420.0000 mg | Freq: Once | INTRAVENOUS | Status: AC
  Administered 2016-04-24: 420 mg via INTRAVENOUS
  Filled 2016-04-24: qty 14

## 2016-04-24 MED ORDER — PEGFILGRASTIM 6 MG/0.6ML ~~LOC~~ PSKT
6.0000 mg | PREFILLED_SYRINGE | Freq: Once | SUBCUTANEOUS | Status: AC
  Administered 2016-04-24: 6 mg via SUBCUTANEOUS
  Filled 2016-04-24: qty 0.6

## 2016-04-24 MED ORDER — TRASTUZUMAB CHEMO 150 MG IV SOLR
6.0000 mg/kg | Freq: Once | INTRAVENOUS | Status: AC
  Administered 2016-04-24: 420 mg via INTRAVENOUS
  Filled 2016-04-24: qty 20

## 2016-04-24 MED ORDER — SODIUM CHLORIDE 0.9% FLUSH
10.0000 mL | INTRAVENOUS | Status: DC | PRN
  Administered 2016-04-24: 10 mL
  Filled 2016-04-24: qty 10

## 2016-04-24 MED ORDER — DOCETAXEL CHEMO INJECTION 160 MG/16ML
60.0000 mg/m2 | Freq: Once | INTRAVENOUS | Status: AC
  Administered 2016-04-24: 110 mg via INTRAVENOUS
  Filled 2016-04-24: qty 11

## 2016-04-24 MED ORDER — DIPHENHYDRAMINE HCL 25 MG PO CAPS
ORAL_CAPSULE | ORAL | Status: AC
  Filled 2016-04-24: qty 2

## 2016-04-24 MED ORDER — DEXAMETHASONE SODIUM PHOSPHATE 10 MG/ML IJ SOLN
10.0000 mg | Freq: Once | INTRAMUSCULAR | Status: AC
  Administered 2016-04-24: 10 mg via INTRAVENOUS
  Filled 2016-04-24: qty 1

## 2016-04-24 MED ORDER — DIPHENHYDRAMINE HCL 25 MG PO CAPS
50.0000 mg | ORAL_CAPSULE | Freq: Once | ORAL | Status: AC
  Administered 2016-04-24: 50 mg via ORAL

## 2016-04-24 MED ORDER — SODIUM CHLORIDE 0.9 % IV SOLN
Freq: Once | INTRAVENOUS | Status: AC
  Administered 2016-04-24: 10:00:00 via INTRAVENOUS

## 2016-04-24 MED ORDER — ACETAMINOPHEN 325 MG PO TABS
ORAL_TABLET | ORAL | Status: AC
  Filled 2016-04-24: qty 2

## 2016-04-24 MED ORDER — ACETAMINOPHEN 325 MG PO TABS
650.0000 mg | ORAL_TABLET | Freq: Once | ORAL | Status: AC
  Administered 2016-04-24: 650 mg via ORAL

## 2016-04-24 MED ORDER — HEPARIN SOD (PORK) LOCK FLUSH 100 UNIT/ML IV SOLN
500.0000 [IU] | Freq: Once | INTRAVENOUS | Status: AC | PRN
  Administered 2016-04-24: 500 [IU]
  Filled 2016-04-24: qty 5

## 2016-04-24 NOTE — Patient Instructions (Addendum)
[place] at [facility] Discharge Instructions  RECOMMENDATIONS MADE BY THE CONSULTANT AND ANY TEST RESULTS WILL BE SENT TO YOUR REFERRING PHYSICIAN.  You were seen today by Kirby Crigler PA-C. Treatment today. Try water with quinine for cramping. Return in 3 weeks for follow up and treatment.    Thank you for choosing [place] at [facility] to provide your oncology and hematology care.  To afford each patient quality time with our provider, please arrive at least 15 minutes before your scheduled appointment time.    If you have a lab appointment with the [facility] please come in thru the  Main Entrance and check in at the main information desk  You need to re-schedule your appointment should you arrive 10 or more minutes late.  We strive to give you quality time with our providers, and arriving late affects you and other patients whose appointments are after yours.  Also, if you no show three or more times for appointments you may be dismissed from the clinic at the providers discretion.     Again, thank you for choosing [facility].  Our hope is that these requests will decrease the amount of time that you wait before being seen by our physicians.       _____________________________________________________________  Should you have questions after your visit to [facility], please contact our office at [phone] between the hours of 8:30 a.m. and 4:30 p.m.  Voicemails left after 4:30 p.m. will not be returned until the following business day.  For prescription refill requests, have your pharmacy contact our office.       Resources For Cancer Patients and their Caregivers ? American Cancer Society: Can assist with transportation, wigs, general needs, runs Look Good Feel Better.        203 711 1913 ? Cancer Care: Provides financial assistance, online support groups, medication/co-pay assistance.   1-800-813-HOPE 307-100-2256) ? [place] Assists [place] cancer patients and their families through emotional , educational and financial support.  (703)430-8385 ? [place] DSS Where to apply for food stamps, Medicaid and utility assistance. 301-274-2478 ? RCATS: Transportation to medical appointments. 6067731779 ? Social Security Administration: May apply for disability if have a Stage IV cancer. ([phone] [phone] ? [employer], Disability and Transit Services: Assists with nutrition, care and transit needs. [place] Support Programs: @10RELATIVEDAYS @ > Cancer Support Group  2nd Tuesday of the month 1pm-2pm, Journey Room  > Creative Journey  3rd Tuesday of the month 1130am-1pm, Journey Room  > Look Good Feel Better  1st Wednesday of the month 10am-12 noon, Journey Room (Call [place] to register [phone])

## 2016-04-24 NOTE — Progress Notes (Signed)
Sheila Garrison tolerated chemo tx and Neulasta on-pro well without complaints or incident.Labs reviewed with Kirby Crigler PA prior to administering chemotherapy. Calcium 7.6 and corrected calcium 8.3 so Xgeva injection held today and pt instructed to take 2 Calcium tablets and 2 tums daily. Pt verbalized understanding. Neulasat on-pro applied to pt's right arm with green indicator light flashing.VSS upon discharge. Pt discharged self ambulatory using cane in satisfactory condition accompanied by a friend

## 2016-04-24 NOTE — Patient Instructions (Signed)
[facility] Discharge Instructions for Patients Receiving Chemotherapy   Beginning January 23rd 2017 lab work for the [facility] will be done in the  Main lab at [facility] on 1st floor. If you have a lab appointment with the [place] please come in thru the  Main Entrance and check in at the main information desk   Today you received the following chemotherapy agents Taxotere,Herceptin and Perjeta as well as Neulasta on-pro  To help prevent nausea and vomiting after your treatment, we encourage you to take your nausea medication   If you develop nausea and vomiting, or diarrhea that is not controlled by your medication, call the clinic.  The clinic phone number is [phone]. Office hours are Monday-Friday 8:30am-5:00pm.  BELOW ARE SYMPTOMS THAT SHOULD BE REPORTED IMMEDIATELY:  *FEVER GREATER THAN 101.0 F  *CHILLS WITH OR WITHOUT FEVER  NAUSEA AND VOMITING THAT IS NOT CONTROLLED WITH YOUR NAUSEA MEDICATION  *UNUSUAL SHORTNESS OF BREATH  *UNUSUAL BRUISING OR BLEEDING  TENDERNESS IN MOUTH AND THROAT WITH OR WITHOUT PRESENCE OF ULCERS  *URINARY PROBLEMS  *BOWEL PROBLEMS  UNUSUAL RASH Items with * indicate a potential emergency and should be followed up as soon as possible. If you have an emergency after office hours please contact your primary care physician or go to the nearest emergency department.  Please call the clinic during office hours if you have any questions or concerns.   You may also contact the Patient Navigator at [phone] should you have any questions or need assistance in obtaining follow up care.      Resources For Cancer Patients and their Caregivers ? American Cancer Society: Can assist with transportation, wigs, general needs, runs Look Good Feel Better.        (419) 347-1287 ? Cancer Care: Provides financial assistance, online support groups, medication/co-pay assistance.  1-800-813-HOPE 563-241-2597) ? [place] Assists [place] cancer patients and their families through emotional , educational and financial support.  205-241-4044 ? [place] DSS Where to apply for food stamps, Medicaid and utility assistance. 313-322-7250 ? RCATS: Transportation to medical appointments. 239-305-9894 ? Social Security Administration: May apply for disability if have a Stage IV cancer. 438-123-5042 939-045-6415 ? [employer], Disability and Transit Services: Assists with nutrition, care and transit needs. [phone])816-8513

## 2016-04-24 NOTE — Progress Notes (Signed)
Sheila Dun, FNP [place] [address]  [name], stage 4, left ([place])  Bone metastases ([place])  CURRENT THERAPY: Docetaxel/Herceptin/Perjeta beginning on 03/06/2016.  Docetaxel dose reduced by 20% beginning on 03/27/2016.  INTERVAL HISTORY: Sheila Garrison 61 y.o. 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 AND Invasive lobular left breast cancer, Stage IIB (pT3pN2A), S/P left mastectomy by Dr. Arnoldo Morale on 12/16/2015, ER/PR+ 2%.  HER2 NEGATIVE.  Done prior to knowing about Stage IV disease. 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 Margot Chimes, systemic chemotherapy by Dr. Jana Hakim in 2012 and Herceptin x 52 weeks finishing on 07/10/2011, XRT (01/04/2011- 02/22/2011), and anti-endocrine therapy.    Stage III (T4N3) R Br cancer    06/21/2010 Initial Diagnosis    Stage III (T4N3) R Br cancer       01/04/2011 - 02/22/2011 Radiation Therapy    50 Gy       05/07/2014 Pathology Results    BCI testing- low risk of late recurrence (3.4% between years 5-10), high likelihood of benefit from extended endocrein therapy, and a 67% relative risk reduction when treated with extended endocrine therapy (27% versus 10.5%).       [name], stage 4, left ([place])   11/22/2015 Mammogram    Ultrasound-guided biopsy of the irregular hypoechoic area, with prominent shadowing, in the OUTER left breast at the 1 o'clock axis. 2. Ultrasound-guided biopsy of the irregular mass within the INNER left breast at the 9 o'clock axis. 3. Ultrasound-guided biopsy of 1 of the enlarged and amorphous lymph nodes in the left axilla.      11/22/2015 Imaging    Ultrasound-guided biopsy of the irregular hypoechoic area, with prominent shadowing, in the OUTER left breast at the 1  o'clock axis. 2. Ultrasound-guided biopsy of the irregular mass within the INNER left breast at the 9 o'clock axis. 3. Ultrasound-guided biopsy of 1 of the enlarged and amorphous lymph nodes in the left axilla.      12/16/2015 Surgery    L modified radical mastectomy with Dr. Arnoldo Morale      12/18/2015 - 12/21/2015 Hospital Admission    Admit date: 12/18/2015 Admission diagnosis: altered MS, acute respiratory failure Additional comments: This likely was a combination of COPD exacerbation along with mild acute on chronic diastolic CHF with  echo shows a preserved EF of around 60%, he was treated with IV steroids and Lasix, she is much better but qualifies for home oxygen which will be provided      12/19/2015 Imaging    No evidence of pulmonary embolus. 2. Small right pleural effusion, with associated atelectasis. Peripheral scarring at the anterior right upper lobe. Mild bilateral emphysema noted. 3. Scattered coronary artery calcifications seen. 4. Postoperative change at the left chest wall, reflecting recent mastectomy, with vague collections of postoperative fluid seen. 5. Vague sclerotic change and heterogeneity at vertebral body T8 raises concern for sequelae of metastatic disease      01/23/2016 PET scan    1. Widespread multifocal metastatic disease in the visualized axial and appendicular skeleton. 2. Solitary metastatic lesion in the liver. Hypermetabolic irregular left axillary lymph node adjacent to the left axillary clips. 3. Activity in the left breast related to mastectomy. 4. Low-grade activity along the pleuroparenchymal thickening anteriorly in the right chest is probably  related to prior therapy rather than current malignancy. 5. Probable mild compression fracture at T8 associated with the extensive tumor at this level. There is also bony destruction of the right ninth rib posteriorly. 6. Faintly hypermetabolic but small left external iliac lymph nodes merit  surveillance  ADDENDUM: The original report was by Dr. Van Clines. The following addendum is by Dr. Van Clines:  I have one other musculoskeletal focus of hypermetabolic activity to mention. In the right latissimus dorsi muscle, there is a small hypermetabolic focus with maximum SUV of 5.4, measuring 7 mm in diameter on image 89/4, compatible with a metastatic deposit.        02/02/2016 Imaging    MRI T-spine: 1. Extensive metastatic disease throughout the cervical and thoracic spine with a subtle pathologic compression fracture of T8. Tumor extends through the posterior margin of the T8 vertebra into the spinal canal without spinal cord compression at this time. 2. No other pathologic fractures of the thoracic spine at this time.      02/10/2016 Procedure    US biopsy of liver lesion      02/15/2016 Pathology Results    Liver, needle/core biopsy, right lobe METASTATIC CARCINOMA, CONSISTENT WITH BREAST DUCTAL CARCINOMA.      02/15/2016 - 02/28/2016 Chemotherapy    Letrozole 2.5 mg daily + Kisqali (600 mg) days 1-21 every 28 days       02/21/2016 Pathology Results    Breast Prognostic profile from liver biopsy: HER2 POSITIVE, ER/PR NEGATIVE (0%).      02/27/2016 - 03/09/2016 Radiation Therapy    Palliative XRT to T7-T11 with Dr. Lianne Cure; 30 Gy in 10 fractions.       02/28/2016 Treatment Plan Change    Due to HER2 positivity and ER/PR negativity from liver biopsy, change to systemic chemotherapy is recommended.      03/06/2016 -  Chemotherapy    Docetaxel/Herceptin/Perjeta with Neulasta support.      03/11/2016 - 03/15/2016 Hospital Admission    Admit date: 03/11/2016 Admission diagnosis: Febrile neutropenia Additional comments: COPD exacerbation      03/27/2016 Treatment Plan Change    Docetaxel dose reduced by 20%.      03/27/2016 Adverse Reaction    Demonstrating early signs of palmar-plantar erythrodysesthesia.      03/27/2016 Treatment Plan Change     Defer treatment 1 week.      04/10/2016 Imaging    MUGA- The left ventricular ejection fraction is equal to 56.2%. Normal left ventricular wall motion.       She is doing well.  She notes one episode of 2 loose stools.  She notes that this was resolved with Imodium therapy.  She denies any recurrence of this.  Her appetite is fair.  Weight is stable.  She denies any nausea or vomiting.  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  This predates the start of most recent chemotherapy and is secondary to previous chemotherapy.  She notes that it is at baseline and no worse.  She does note some neuropathy of her fingertips only.  She reports that this is self-limiting only.  She notes some cramping of her hands and legs intermittently.  She is taking potassium supplementation.  I have recommended water with quinine.  She reports some shortness of breath over the last 3-4 days.  It is well-controlled with her albuterol.  She also notes a runny nose, unilateral ear pressure, and cough that is nonproductive of sputum.  Review of Systems  Constitutional: Negative.  Negative  for chills, fever and weight loss.  HENT: Positive for ear pain. Negative for sore throat.   Eyes: Negative.   Respiratory: Positive for cough and sputum production.   Cardiovascular: Negative.  Negative for chest pain.  Gastrointestinal: Negative.  Negative for blood in stool, constipation, diarrhea, melena, nausea and vomiting.  Genitourinary: Negative.   Musculoskeletal: Negative.   Skin: Negative.   Neurological: Negative.  Negative for weakness.  Endo/Heme/Allergies: Negative.   Psychiatric/Behavioral: Negative.     Past Medical History:  Diagnosis Date  . Anxiety   . Bone metastases ([place]) 02/15/2016  . Breast CA ([place]) 07/01/2010   rt breast ca  . COPD (chronic obstructive pulmonary disease) ([place])   . Family history of breast cancer    Aunt on father's side  . Hyperlipemia   . Hypertension     . MRSA (methicillin resistant staph aureus) culture positive    in breast  . Neuropathy ([place])    bilateral toes  . Nipple discharge    with pain, infection, lump  . S/P radiation therapy 01/08/11 - 02/22/11   Right Chest Wall/ 50 Gy / 25 Fractions, Right [place]/ 50 gy/25 Fractions, Right Axillary Boost/500 cGy/25 Fractions, Right Chest Wall Boost/10 Gy/5 Fractions  . Stage III (T4N3) R Br cancer  06/21/2010   Letrozole 2.5 mg which she started on 03/09/2011.  This patient presented to my office on Jun 21, 2010 with a large ulcerated right breast mass and bleeding. She underwent additional workup at that time and a biopsy was done showing invasive breast cancer. She was staged and was found to have a T4  disease that was ER, PR, HER-2 positive. CA 2729 was normal at 31. She is undergoing chemotherapy wi  . Status post chemotherapy    6 cycles of carboplatin and docetaxel with trastuzumab every 21 days followed by surgery  . Tobacco abuse 06/22/2015  . Use of letrozole ([place]) 03/09/2011  . Wears dentures     Past Surgical History:  Procedure Laterality Date  . BREAST SURGERY  2012  . DILATION AND CURETTAGE OF UTERUS    . MASTECTOMY MODIFIED RADICAL Left 12/16/2015   Procedure: MASTECTOMY MODIFIED RADICAL;  Surgeon: Aviva Signs, MD;  Location: [facility];  Service: General;  Laterality: Left;  . port a catheter insertion    . PORT-A-CATH REMOVAL Left 01/15/2013   Procedure: REMOVAL PORT-A-CATH;  Surgeon: Scherry Ran, MD;  Location: [facility];  Service: General;  Laterality: Left;  . PORTACATH PLACEMENT Right 01/16/2016   Procedure: INSERTION PORT-A-CATH;  Surgeon: Aviva Signs, MD;  Location: [facility];  Service: General;  Laterality: Right;  . TUBAL LIGATION      Family History  Problem Relation Age of Onset  . COPD Mother   . Diabetes Father   . Hypertension Father   . COPD Brother   . Cancer Maternal Aunt     ovarian - died of old age  . Cancer Paternal Aunt     had breast ca;  died of of a different cancer  . Lung cancer Brother     Social History   Social History  . Marital status: Married    Spouse name: N/A  . Number of children: N/A  . Years of education: N/A   Social History Main Topics  . Smoking status: Former Smoker    Packs/day: 0.25    Years: 30.00    Types: Cigarettes    Quit date: 12/15/2015  . Smokeless tobacco: Never Used  .  Alcohol use No  . Drug use: No  . Sexual activity: Yes    Birth control/ protection: Post-menopausal   Other Topics Concern  . None   Social History Narrative  . None     PHYSICAL EXAMINATION  ECOG PERFORMANCE STATUS: 1 - Symptomatic but completely ambulatory  Vitals:   04/24/16 0907  BP: 120/65  Pulse: (!) 104  Resp: (!) 22  Temp: 98.1 F (36.7 C)    Vitals - 1 value per visit 1/88/4166  SYSTOLIC 063  DIASTOLIC 65  Pulse 016  Temperature 98.1  Respirations 22  Weight (lb) 149.2    GENERAL:alert, no distress, well nourished, well developed, comfortable, cooperative, smiling and accompanied by friend, in chemo-recliner. SKIN: skin color, texture, turgor are normal, no rashes or significant lesions HEAD: Normocephalic, No masses, lesions, tenderness or abnormalities EYES: normal, EOMI, Conjunctiva are pink and non-injected EARS: External ears normal.  B/L TMs are pearly white without any erythema or fluid. OROPHARYNX:lips, buccal mucosa, and tongue normal and mucous membranes are moist  NECK: supple, no adenopathy, trachea midline LYMPH:  no palpable lymphadenopathy BREAST:not examined LUNGS: clear to auscultation, decreased breath sounds bilaterally HEART: regular rate & rhythm, no murmurs and no gallops ABDOMEN:abdomen soft and normal bowel sounds BACK: Back symmetric, no curvature. EXTREMITIES:less then 2 second capillary refill, no joint deformities, effusion, or inflammation, no skin discoloration, no cyanosis  NEURO: alert & oriented x 3 with fluent speech, no focal motor/sensory  deficits, gait normal   LABORATORY DATA: CBC    Component Value Date/Time   WBC 17.7 (H) 04/24/2016 0850   RBC 3.68 (L) 04/24/2016 0850   HGB 11.1 (L) 04/24/2016 0850   HGB 10.1 (L) 11/07/2010 0917   HCT 34.3 (L) 04/24/2016 0850   HCT 41.3 12/27/2015 1553   HCT 31.1 (L) 11/07/2010 0917   PLT 375 04/24/2016 0850   PLT 406 (H) 12/27/2015 1553   MCV 93.2 04/24/2016 0850   MCV 92 12/27/2015 1553   MCV 100.3 11/07/2010 0917   MCH 30.2 04/24/2016 0850   MCHC 32.4 04/24/2016 0850   RDW 17.1 (H) 04/24/2016 0850   RDW 14.8 12/27/2015 1553   RDW 18.3 (H) 11/07/2010 0917   LYMPHSABS 0.3 (L) 04/24/2016 0850   LYMPHSABS 1.9 12/27/2015 1553   LYMPHSABS 1.6 11/07/2010 0917   MONOABS 0.7 04/24/2016 0850   MONOABS 1.4 (H) 11/07/2010 0917   EOSABS 0.0 04/24/2016 0850   EOSABS 0.2 12/27/2015 1553   BASOSABS 0.0 04/24/2016 0850   BASOSABS 0.0 12/27/2015 1553   BASOSABS 0.0 11/07/2010 0917      Chemistry      Component Value Date/Time   NA 137 04/24/2016 0850   NA 140 12/27/2015 1553   K 3.9 04/24/2016 0850   CL 100 (L) 04/24/2016 0850   CO2 26 04/24/2016 0850   BUN 13 04/24/2016 0850   BUN 22 12/27/2015 1553   CREATININE 0.54 04/24/2016 0850      Component Value Date/Time   CALCIUM 7.6 (L) 04/24/2016 0850   ALKPHOS 125 04/24/2016 0850   AST 20 04/24/2016 0850   ALT 18 04/24/2016 0850   BILITOT 0.2 (L) 04/24/2016 0850   BILITOT 0.2 10/07/2015 0859        PENDING LABS:   RADIOGRAPHIC STUDIES:  Nm Cardiac Muga Rest  Result Date: 04/10/2016 CLINICAL DATA:  Breast cancer.  Chemotherapy. EXAM: NUCLEAR MEDICINE CARDIAC BLOOD POOL IMAGING (MUGA) TECHNIQUE: Cardiac multi-gated acquisition was performed at rest following intravenous injection of Tc-59mlabeled red blood  cells. RADIOPHARMACEUTICALS:  19.03 mCi Tc-7mMDP in-vitro labeled red blood cells IV COMPARISON:  None. FINDINGS: The left ventricular ejection fraction is equal to 56.2%. Normal left ventricular wall motion.  IMPRESSION: The left ventricular ejection fraction is equal to 56.2%. Electronically Signed   By: TKerby MoorsM.D.   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  She is now on systemic chemotherapy consisting of Docetaxel/Herceptin/Perjeta with Neulasta support every 21 days with Docetaxel being reduced by 20% for cycle 2 after hospitalization for febrile neutropenia following cycle #1 of therapy.  She is S/P palliative XRT to T7-T11 with Dr. [place]ianne Curein [place] 02/27/2016- 03/09/2016.  Complicated by a LEFT invasive lobular carcinoma, Stage IIB (pT3pN2A), S/P left mastectomy by Dr. JArnoldo Moraleon 12/16/2015, ER/PR+ 2%.  HER2 NEGATIVE.  Done prior to knowing about Stage IV disease. AND History with right invasive ductal breast cancer, Stage IIIC, ER/PR+ and HER 2 POSITIVE in 2012 managed with right mastectomy, systemic chemotherapy, HER2 targeted therapy, XRT, and anti-endocrine therapy   Oncology history is updated.  Pre-treatment labs: CBC diff, CMET, CA 27.29, and CA15-3.  I personally reviewed and went over laboratory results with the patient.  The results are noted within this dictation.  Lab satisfy treatment parameters today.    She tolerated her last treatment well without any major side effects from chemotherapy.  Dose reduction of docetaxel is been very helpful thus far.  I have recommended water with quinine for her cramping.  She will continue with K+ supplementation.  Her SOB resolves with Albuterol.  She will continue with this.  She denies any chest pain.  Return in 3 weeks for follow-up for cycle #4.  Bone metastases ([facility]) Bone metastases with T8 compression fracture.  XDelton Seeis held today due to a corrected calcium below the normal limits.  I have asked her to increase Ca++ to 1200 mg daily and  continue 2 TUMS per day.   ORDERS PLACED FOR THIS ENCOUNTER: No orders of the defined types were placed in this encounter.   MEDICATIONS PRESCRIBED THIS ENCOUNTER: No orders of the defined types were placed in this encounter.   THERAPY PLAN:  Systemic chemotherapy 6 cycles followed by transition to oral maintenance therapy.  All questions were answered. The patient knows to call the clinic with any problems, questions or concerns. We can certainly see the patient much sooner if necessary.  Patient and plan discussed with Dr. LTwana Firstand she is in agreement with the aforementioned.   This note is electronically signed by: KDoy Mince3/20/2018 12:20 PM

## 2016-04-24 NOTE — Assessment & Plan Note (Signed)
Bone metastases with T8 compression fracture.  Sheila Garrison is held today due to a corrected calcium below the normal limits.  I have asked her to increase Ca++ to 1200 mg daily and continue 2 TUMS per day.

## 2016-04-30 ENCOUNTER — Telehealth (HOSPITAL_COMMUNITY): Payer: Self-pay

## 2016-04-30 NOTE — Telephone Encounter (Signed)
Patient had called Triage line on Saturday because her mouth was getting sore and her [place]ukes mouthwash was expired. Patient denies fever and blisters in her mouth. She states she is doing well. Instructed to call cancer center if any questions, concerns or needs with understanding verbalized.

## 2016-05-15 ENCOUNTER — Encounter (HOSPITAL_COMMUNITY): Payer: PRIVATE HEALTH INSURANCE | Attending: Hematology & Oncology

## 2016-05-15 ENCOUNTER — Encounter (HOSPITAL_COMMUNITY): Payer: Self-pay

## 2016-05-15 ENCOUNTER — Encounter (HOSPITAL_COMMUNITY): Payer: PRIVATE HEALTH INSURANCE | Attending: Oncology | Admitting: Adult Health

## 2016-05-15 VITALS — BP 129/72 | HR 88 | Temp 98.0°F | Resp 16 | Wt 147.0 lb

## 2016-05-15 DIAGNOSIS — R0602 Shortness of breath: Secondary | ICD-10-CM

## 2016-05-15 DIAGNOSIS — Z5111 Encounter for antineoplastic chemotherapy: Secondary | ICD-10-CM

## 2016-05-15 DIAGNOSIS — C787 Secondary malignant neoplasm of liver and intrahepatic bile duct: Secondary | ICD-10-CM | POA: Diagnosis not present

## 2016-05-15 DIAGNOSIS — C50912 Malignant neoplasm of unspecified site of left female breast: Secondary | ICD-10-CM | POA: Diagnosis not present

## 2016-05-15 DIAGNOSIS — F411 Generalized anxiety disorder: Secondary | ICD-10-CM

## 2016-05-15 DIAGNOSIS — C773 Secondary and unspecified malignant neoplasm of axilla and upper limb lymph nodes: Secondary | ICD-10-CM | POA: Diagnosis not present

## 2016-05-15 DIAGNOSIS — C50911 Malignant neoplasm of unspecified site of right female breast: Secondary | ICD-10-CM

## 2016-05-15 DIAGNOSIS — C7951 Secondary malignant neoplasm of bone: Secondary | ICD-10-CM | POA: Diagnosis not present

## 2016-05-15 DIAGNOSIS — C50919 Malignant neoplasm of unspecified site of unspecified female breast: Secondary | ICD-10-CM | POA: Insufficient documentation

## 2016-05-15 DIAGNOSIS — Z5112 Encounter for antineoplastic immunotherapy: Secondary | ICD-10-CM

## 2016-05-15 DIAGNOSIS — K1231 Oral mucositis (ulcerative) due to antineoplastic therapy: Secondary | ICD-10-CM

## 2016-05-15 LAB — COMPREHENSIVE METABOLIC PANEL
ALBUMIN: 3.5 g/dL (ref 3.5–5.0)
ALK PHOS: 127 U/L — AB (ref 38–126)
ALT: 20 U/L (ref 14–54)
AST: 23 U/L (ref 15–41)
Anion gap: 10 (ref 5–15)
BUN: 13 mg/dL (ref 6–20)
CALCIUM: 7.7 mg/dL — AB (ref 8.9–10.3)
CO2: 29 mmol/L (ref 22–32)
CREATININE: 0.48 mg/dL (ref 0.44–1.00)
Chloride: 98 mmol/L — ABNORMAL LOW (ref 101–111)
GFR calc non Af Amer: >60 mL/min (ref >60)
GLUCOSE: 132 mg/dL — AB (ref 65–99)
Potassium: 3.6 mmol/L (ref 3.5–5.1)
SODIUM: 137 mmol/L (ref 135–145)
Total Bilirubin: 0.4 mg/dL (ref 0.3–1.2)
Total Protein: 6.9 g/dL (ref 6.5–8.1)

## 2016-05-15 LAB — CBC WITH DIFFERENTIAL/PLATELET
BASOS ABS: 0 10*3/uL (ref 0.0–0.1)
Basophils Relative: 0 %
EOS ABS: 0 10*3/uL (ref 0.0–0.7)
Eosinophils Relative: 0 %
HEMATOCRIT: 36 % (ref 36.0–46.0)
HEMOGLOBIN: 11.5 g/dL — AB (ref 12.0–15.0)
Lymphocytes Relative: 2 %
Lymphs Abs: 0.6 10*3/uL — ABNORMAL LOW (ref 0.7–4.0)
MCH: 29.8 pg (ref 26.0–34.0)
MCHC: 31.9 g/dL (ref 30.0–36.0)
MCV: 93.3 fL (ref 78.0–100.0)
MONOS PCT: 5 %
Monocytes Absolute: 1.4 10*3/uL — ABNORMAL HIGH (ref 0.1–1.0)
NEUTROS PCT: 93 %
Neutro Abs: 25.5 10*3/uL — ABNORMAL HIGH (ref 1.7–7.7)
Platelets: 276 10*3/uL (ref 150–400)
RBC: 3.86 MIL/uL — AB (ref 3.87–5.11)
RDW: 18.6 % — ABNORMAL HIGH (ref 11.5–15.5)
WBC: 27.5 10*3/uL — AB (ref 4.0–10.5)

## 2016-05-15 LAB — PHOSPHORUS: Phosphorus: 1.5 mg/dL — ABNORMAL LOW (ref 2.5–4.6)

## 2016-05-15 LAB — MAGNESIUM: MAGNESIUM: 1.1 mg/dL — AB (ref 1.7–2.4)

## 2016-05-15 MED ORDER — LORAZEPAM 0.5 MG PO TABS: 0.5000 mg | ORAL_TABLET | Freq: Three times a day (TID) | ORAL | 2 refills | Status: DC | PRN

## 2016-05-15 MED ORDER — HEPARIN SOD (PORK) LOCK FLUSH 100 UNIT/ML IV SOLN
INTRAVENOUS | Status: AC
  Filled 2016-05-15: qty 5

## 2016-05-15 MED ORDER — HEPARIN SOD (PORK) LOCK FLUSH 100 UNIT/ML IV SOLN
500.0000 [IU] | Freq: Once | INTRAVENOUS | Status: AC | PRN
  Administered 2016-05-15: 500 [IU]
  Filled 2016-05-15: qty 5

## 2016-05-15 MED ORDER — ACETAMINOPHEN 325 MG PO TABS
650.0000 mg | ORAL_TABLET | Freq: Once | ORAL | Status: AC
  Administered 2016-05-15: 650 mg via ORAL

## 2016-05-15 MED ORDER — MAGNESIUM SULFATE 50 % IJ SOLN
2.0000 g | Freq: Once | INTRAMUSCULAR | Status: DC
Start: 2016-05-15 — End: 2016-05-15
  Filled 2016-05-15: qty 4

## 2016-05-15 MED ORDER — PERTUZUMAB CHEMO INJECTION 420 MG/14ML
420.0000 mg | Freq: Once | INTRAVENOUS | Status: AC
  Administered 2016-05-15: 420 mg via INTRAVENOUS
  Filled 2016-05-15: qty 14

## 2016-05-15 MED ORDER — DIPHENHYDRAMINE HCL 25 MG PO CAPS
50.0000 mg | ORAL_CAPSULE | Freq: Once | ORAL | Status: AC
  Administered 2016-05-15: 50 mg via ORAL

## 2016-05-15 MED ORDER — DEXTROSE 5 % IV SOLN
60.0000 mg/m2 | Freq: Once | INTRAVENOUS | Status: AC
  Administered 2016-05-15: 110 mg via INTRAVENOUS
  Filled 2016-05-15: qty 11

## 2016-05-15 MED ORDER — DIPHENHYDRAMINE HCL 25 MG PO CAPS
ORAL_CAPSULE | ORAL | Status: AC
  Filled 2016-05-15: qty 2

## 2016-05-15 MED ORDER — MAGNESIUM SULFATE 2 GM/50ML IV SOLN
2.0000 g | Freq: Once | INTRAVENOUS | Status: AC
  Administered 2016-05-15: 2 g via INTRAVENOUS
  Filled 2016-05-15: qty 50

## 2016-05-15 MED ORDER — ACETAMINOPHEN 325 MG PO TABS
ORAL_TABLET | ORAL | Status: AC
  Filled 2016-05-15: qty 2

## 2016-05-15 MED ORDER — MAGIC MOUTHWASH W/LIDOCAINE: 5.0000 mL | Freq: Four times a day (QID) | ORAL | 0 refills | Status: DC | PRN

## 2016-05-15 MED ORDER — K PHOS MONO-SOD PHOS DI & MONO 155-852-130 MG PO TABS
500.0000 mg | ORAL_TABLET | Freq: Once | ORAL | Status: AC
  Administered 2016-05-15: 500 mg via ORAL
  Filled 2016-05-15: qty 2

## 2016-05-15 MED ORDER — DEXAMETHASONE SODIUM PHOSPHATE 10 MG/ML IJ SOLN
10.0000 mg | Freq: Once | INTRAMUSCULAR | Status: AC
  Administered 2016-05-15: 10 mg via INTRAVENOUS
  Filled 2016-05-15: qty 1

## 2016-05-15 MED ORDER — SODIUM CHLORIDE 0.9% FLUSH
10.0000 mL | INTRAVENOUS | Status: DC | PRN
  Administered 2016-05-15: 10 mL
  Filled 2016-05-15: qty 10

## 2016-05-15 MED ORDER — PEGFILGRASTIM 6 MG/0.6ML ~~LOC~~ PSKT
6.0000 mg | PREFILLED_SYRINGE | Freq: Once | SUBCUTANEOUS | Status: AC
  Administered 2016-05-15: 6 mg via SUBCUTANEOUS
  Filled 2016-05-15: qty 0.6

## 2016-05-15 MED ORDER — SODIUM CHLORIDE 0.9 % IV SOLN
2.0000 g | Freq: Once | INTRAVENOUS | Status: AC
  Administered 2016-05-15: 2 g via INTRAVENOUS
  Filled 2016-05-15: qty 20

## 2016-05-15 MED ORDER — SODIUM CHLORIDE 0.9 % IV SOLN
Freq: Once | INTRAVENOUS | Status: AC
  Administered 2016-05-15: 10:00:00 via INTRAVENOUS

## 2016-05-15 MED ORDER — TRASTUZUMAB CHEMO 150 MG IV SOLR
6.0000 mg/kg | Freq: Once | INTRAVENOUS | Status: AC
  Administered 2016-05-15: 420 mg via INTRAVENOUS
  Filled 2016-05-15: qty 20

## 2016-05-15 NOTE — Progress Notes (Signed)
Refilled magic mouthwash and ativan prescriptions. Also paper filled out for handicap access and returned to patient.   No xgeva today per protocol.Labs reviewed with MD. Proceed with treatment.  Chemotherapy given today per orders. Calcium gluconate given today per orders. Magnesium given per orders. Marland KitchenOlen Pel arrived today for [facility] neulasta on body injector. See MAR for administration details. Injector in place and engaged with green light indicator on flashing. Tolerated application with out problems.  Patient tolerated it well, no issues. Vitals stable and discharged home ambulatory from clinic.follow up as scheduled.

## 2016-05-15 NOTE — Patient Instructions (Signed)
[facility] Discharge Instructions for Patients Receiving Chemotherapy   Beginning January 23rd 2017 lab work for the Cancer Center will be done in the  Main lab at [facility] on 1st floor. If you have a lab appointment with the Cancer Center please come in thru the  Main Entrance and check in at the main information desk   Today you received the following chemotherapy agents   To help prevent nausea and vomiting after your treatment, we encourage you to take your nausea medication     If you develop nausea and vomiting, or diarrhea that is not controlled by your medication, call the clinic.  The clinic phone number is [phone]. Office hours are Monday-Friday 8:30am-5:00pm.  BELOW ARE SYMPTOMS THAT SHOULD BE REPORTED IMMEDIATELY:  *FEVER GREATER THAN 101.0 F  *CHILLS WITH OR WITHOUT FEVER  NAUSEA AND VOMITING THAT IS NOT CONTROLLED WITH YOUR NAUSEA MEDICATION  *UNUSUAL SHORTNESS OF BREATH  *UNUSUAL BRUISING OR BLEEDING  TENDERNESS IN MOUTH AND THROAT WITH OR WITHOUT PRESENCE OF ULCERS  *URINARY PROBLEMS  *BOWEL PROBLEMS  UNUSUAL RASH Items with * indicate a potential emergency and should be followed up as soon as possible. If you have an emergency after office hours please contact your primary care physician or go to the nearest emergency department.  Please call the clinic during office hours if you have any questions or concerns.   You may also contact the Patient Navigator at [phone] should you have any questions or need assistance in obtaining follow up care.      Resources For Cancer Patients and their Caregivers ? American Cancer Society: Can assist with transportation, wigs, general needs, runs Look Good Feel Better.        1-888-227-6333 ? Cancer Care: Provides financial assistance, online support groups, medication/co-pay assistance.  1-800-813-HOPE (4673) ? [facility] Assists [place] cancer  patients and their families through emotional , educational and financial support.  336-427-4357 ? [place] DSS Where to apply for food stamps, Medicaid and utility assistance. 336-342-1394 ? [facility]: Transportation to medical appointments. 336-347-2287 ? Social Security Administration: May apply for disability if have a Stage IV cancer. 336-342-7796 1-800-772-1213 ? [place] Aging, Disability and Transit Services: Assists with nutrition, care and transit needs. 336-349-2343         

## 2016-05-15 NOTE — Progress Notes (Signed)
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   CURRENT THERAPY: Taxotere/Herceptin/Perjeta every 21 days beginning on 03/06/16 AND Xgeva injection every 28 days.    BRIEF ONCOLOGIC HISTORY:    Stage III (T4N3) R Br cancer    06/21/2010 Initial Diagnosis    Stage III (T4N3) R Br cancer       01/04/2011 - 02/22/2011 Radiation Therapy    50 Gy       05/07/2014 Pathology Results    BCI testing- low risk of late recurrence (3.4% between years 5-10), high likelihood of benefit from extended endocrein therapy, and a 67% relative risk reduction when treated with extended endocrine therapy (27% versus 10.5%).       [name], stage 4, left ([place])   11/22/2015 Mammogram    Ultrasound-guided biopsy of the irregular hypoechoic area, with prominent shadowing, in the OUTER left breast at the 1 o'clock axis. 2. Ultrasound-guided biopsy of the irregular mass within the INNER left breast at the 9 o'clock axis. 3. Ultrasound-guided biopsy of 1 of the enlarged and amorphous lymph nodes in the left axilla.      11/22/2015 Imaging    Ultrasound-guided biopsy of the irregular hypoechoic area, with prominent shadowing, in the OUTER left breast at the 1 o'clock axis. 2. Ultrasound-guided biopsy of the irregular mass within the INNER left breast at the 9 o'clock axis. 3. Ultrasound-guided biopsy of 1 of the enlarged and amorphous lymph nodes in the left axilla.      12/16/2015 Surgery    L modified radical mastectomy with Dr. Arnoldo Morale      12/18/2015 - 12/21/2015 Hospital Admission    Admit date: 12/18/2015 Admission diagnosis: altered MS, acute respiratory failure Additional comments: This likely  was a combination of COPD exacerbation along with mild acute on chronic diastolic CHF with  echo shows a preserved EF of around 60%, he was treated with IV steroids and Lasix, she is much better but qualifies for home oxygen which will be provided      12/19/2015 Imaging    No evidence of pulmonary embolus. 2. Small right pleural effusion, with associated atelectasis. Peripheral scarring at the anterior right upper lobe. Mild bilateral emphysema noted. 3. Scattered coronary artery calcifications seen. 4. Postoperative change at the left chest wall, reflecting recent mastectomy, with vague collections of postoperative fluid seen. 5. Vague sclerotic change and heterogeneity at vertebral body T8 raises concern for sequelae of metastatic disease      01/23/2016 PET scan    1. Widespread multifocal metastatic disease in the visualized axial and appendicular skeleton. 2. Solitary metastatic lesion in the liver. Hypermetabolic irregular left axillary lymph node adjacent to the left axillary clips. 3. Activity in the left breast related to mastectomy. 4. Low-grade activity along the pleuroparenchymal thickening anteriorly in the right chest is probably related to prior therapy rather than current malignancy. 5. Probable mild compression fracture at T8 associated with the extensive tumor at this level. There is also bony destruction of the right ninth rib posteriorly. 6. Faintly hypermetabolic but small left external iliac lymph nodes merit surveillance  ADDENDUM: The original report was by Dr. Van Clines. The following addendum is by Dr. Van Clines:  I have one  other musculoskeletal focus of hypermetabolic activity to mention. In the right latissimus dorsi muscle, there is a small hypermetabolic focus with maximum SUV of 5.4, measuring 7 mm in diameter on image 89/4, compatible with a metastatic deposit.        02/02/2016 Imaging    MRI T-spine: 1. Extensive metastatic  disease throughout the cervical and thoracic spine with a subtle pathologic compression fracture of T8. Tumor extends through the posterior margin of the T8 vertebra into the spinal canal without spinal cord compression at this time. 2. No other pathologic fractures of the thoracic spine at this time.      02/10/2016 Procedure    US biopsy of liver lesion      02/15/2016 Pathology Results    Liver, needle/core biopsy, right lobe METASTATIC CARCINOMA, CONSISTENT WITH BREAST DUCTAL CARCINOMA.      02/15/2016 - 02/28/2016 Chemotherapy    Letrozole 2.5 mg daily + Kisqali (600 mg) days 1-21 every 28 days       02/21/2016 Pathology Results    Breast Prognostic profile from liver biopsy: HER2 POSITIVE, ER/PR NEGATIVE (0%).      02/27/2016 - 03/09/2016 Radiation Therapy    Palliative XRT to T7-T11 with Dr. Lianne Cure; 30 Gy in 10 fractions.       02/28/2016 Treatment Plan Change    Due to HER2 positivity and ER/PR negativity from liver biopsy, change to systemic chemotherapy is recommended.      03/06/2016 -  Chemotherapy    Docetaxel/Herceptin/Perjeta with Neulasta support.      03/11/2016 - 03/15/2016 Hospital Admission    Admit date: 03/11/2016 Admission diagnosis: Febrile neutropenia Additional comments: COPD exacerbation      03/27/2016 Treatment Plan Change    Docetaxel dose reduced by 20%.      03/27/2016 Adverse Reaction    Demonstrating early signs of palmar-plantar erythrodysesthesia.      03/27/2016 Treatment Plan Change    Defer treatment 1 week.      04/10/2016 Imaging    MUGA- The left ventricular ejection fraction is equal to 56.2%. Normal left ventricular wall motion.        HISTORY OF PRESENT ILLNESS:  (From Kirby Crigler, PA-C's last note on 04/24/16)     INTERVAL HISTORY:  Sheila Garrison 61 y.o. female returns to cancer center for follow-up and consideration of next cycle of chemotherapy.   Due for cycle #4 of Taxotere/Herceptin/Perjeta today. Overall, she tells me  she feels really well. Her appetite and energy levels are fair.  She has some shortness of breath, but it has been chronic and is largely no worse than previous.  Of note, she did have CTA chest on 03/14/16 for shortness of breath, which was negative for PE; there was noted to be mild right pleural effusion with some atelectasis at that time.   She has some numbness/tingling to her fingertips, but it is no worse. She denies any new bone pain. Overall, she feels well and ready for her next cycle of chemotherapy.      REVIEW OF SYSTEMS:  Review of Systems  Constitutional: Negative.  Negative for chills, fatigue and fever.  HENT:  Negative.  Negative for lump/mass and nosebleeds.   Eyes: Negative.   Respiratory: Positive for cough. Negative for shortness of breath.   Cardiovascular: Negative.  Negative for chest pain and leg swelling.  Gastrointestinal: Negative.  Negative for abdominal pain, blood in stool, constipation, diarrhea, nausea and vomiting.  Endocrine: Negative.   Genitourinary: Negative.  Negative for dysuria  and hematuria.   Musculoskeletal: Negative.  Negative for arthralgias.  Skin: Negative.  Negative for rash.  Neurological: Negative.  Negative for dizziness and headaches.  Hematological: Negative.  Negative for adenopathy. Does not bruise/bleed easily.  Psychiatric/Behavioral: Negative.  Negative for depression and sleep disturbance. The patient is not nervous/anxious.      PAST MEDICAL/SURGICAL HISTORY:  Past Medical History:  Diagnosis Date  . Anxiety   . Bone metastases ([place]) 02/15/2016  . Breast CA ([place]) 07/01/2010   rt breast ca  . COPD (chronic obstructive pulmonary disease) ([place])   . Family history of breast cancer    Aunt on father's side  . Hyperlipemia   . Hypertension   . MRSA (methicillin resistant staph aureus) culture positive    in breast  . Neuropathy ([place])    bilateral toes  . Nipple discharge    with pain, infection, lump  . S/P radiation  therapy 01/08/11 - 02/22/11   Right Chest Wall/ 50 Gy / 25 Fractions, Right [place]/ 50 gy/25 Fractions, Right Axillary Boost/500 cGy/25 Fractions, Right Chest Wall Boost/10 Gy/5 Fractions  . Stage III (T4N3) R Br cancer  06/21/2010   Letrozole 2.5 mg which she started on 03/09/2011.  This patient presented to my office on Jun 21, 2010 with a large ulcerated right breast mass and bleeding. She underwent additional workup at that time and a biopsy was done showing invasive breast cancer. She was staged and was found to have a T4  disease that was ER, PR, HER-2 positive. CA 2729 was normal at 31. She is undergoing chemotherapy wi  . Status post chemotherapy    6 cycles of carboplatin and docetaxel with trastuzumab every 21 days followed by surgery  . Tobacco abuse 06/22/2015  . Use of letrozole ([place]) 03/09/2011  . Wears dentures    Past Surgical History:  Procedure Laterality Date  . BREAST SURGERY  2012  . DILATION AND CURETTAGE OF UTERUS    . MASTECTOMY MODIFIED RADICAL Left 12/16/2015   Procedure: MASTECTOMY MODIFIED RADICAL;  Surgeon: Aviva Signs, MD;  Location: [facility];  Service: General;  Laterality: Left;  . port a catheter insertion    . PORT-A-CATH REMOVAL Left 01/15/2013   Procedure: REMOVAL PORT-A-CATH;  Surgeon: Scherry Ran, MD;  Location: [facility];  Service: General;  Laterality: Left;  . PORTACATH PLACEMENT Right 01/16/2016   Procedure: INSERTION PORT-A-CATH;  Surgeon: Aviva Signs, MD;  Location: [facility];  Service: General;  Laterality: Right;  . TUBAL LIGATION       SOCIAL HISTORY:  Social History   Social History  . Marital status: Married    Spouse name: N/A  . Number of children: N/A  . Years of education: N/A   Occupational History  . Not on file.   Social History Main Topics  . Smoking status: Former Smoker    Packs/day: 0.25    Years: 30.00    Types: Cigarettes    Quit date: 12/15/2015  . Smokeless tobacco: Never Used  . Alcohol use No  . Drug use:  No  . Sexual activity: Yes    Birth control/ protection: Post-menopausal   Other Topics Concern  . Not on file   Social History Narrative  . No narrative on file    FAMILY HISTORY:  Family History  Problem Relation Age of Onset  . COPD Mother   . Diabetes Father   . Hypertension Father   . COPD Brother   . Cancer Maternal Aunt  ovarian - died of old age  . Cancer Paternal Aunt     had breast ca; died of of a different cancer  . Lung cancer Brother     CURRENT MEDICATIONS:  Outpatient Encounter Prescriptions as of 05/15/2016  Medication Sig Note  . albuterol (PROVENTIL HFA;VENTOLIN HFA) 108 (90 Base) MCG/ACT inhaler Inhale 2 puffs into the lungs every 6 (six) hours as needed for wheezing or shortness of breath.   Marland Kitchen albuterol (PROVENTIL) (2.5 MG/3ML) 0.083% nebulizer solution Inhale 3 mLs into the lungs every 6 (six) hours as needed for wheezing or shortness of breath.   . Cholecalciferol (VITAMIN D) 2000 units tablet Take 2,000 Units by mouth 2 (two) times daily.   Marland Kitchen dexamethasone (DECADRON) 4 MG tablet Take the day before, day of, and the day after chemotherapy.  Take 2 tablets in the am and 2 tablets in the pm.  Take with food. 03/11/2016: Received treatment on 03/06/2016  . DOCEtaxel (TAXOTERE IV) Inject into the vein every 21 ( twenty-one) days.    . fexofenadine (ALLEGRA) 180 MG tablet Take 180 mg by mouth daily.     . fluconazole (DIFLUCAN) 100 MG tablet Take 1 tablet (100 mg total) by mouth daily.   . furosemide (LASIX) 40 MG tablet Take 1 tablet (40 mg total) by mouth daily.   . Ginkgo Biloba 120 MG TABS Take 120 mg by mouth daily.   Marland Kitchen lidocaine-prilocaine (EMLA) cream Apply a quarter size amount to affected area 1 hour prior to coming to chemotherapy.   Marland Kitchen LORazepam (ATIVAN) 0.5 MG tablet Take 1 tablet (0.5 mg total) by mouth every 8 (eight) hours as needed.   Marland Kitchen losartan (COZAAR) 100 MG tablet Take 0.5 tablets (50 mg total) by mouth daily.   . magic mouthwash  w/lidocaine SOLN Take 5 mLs by mouth 4 (four) times daily as needed for mouth pain.   . Multiple Vitamin (MULTIVITAMIN) capsule Take 1 capsule by mouth daily.     . Multiple Vitamins-Minerals (EMERGEN-C IMMUNE PO) Take 1 tablet by mouth daily.   . naproxen sodium (ANAPROX) 220 MG tablet Take 220 mg by mouth 2 (two) times daily as needed (pain). pain   . omeprazole (PRILOSEC OTC) 20 MG tablet Take 40 mg by mouth daily.   . ondansetron (ZOFRAN ODT) 4 MG disintegrating tablet Take 1 tablet (4 mg total) by mouth every 8 (eight) hours as needed for nausea or vomiting.   . ondansetron (ZOFRAN) 8 MG tablet Take 1 tablet (8 mg total) by mouth every 8 (eight) hours as needed for nausea or vomiting.   . Pertuzumab (PERJETA IV) Inject into the vein every 21 ( twenty-one) days.    . potassium chloride SA (K-DUR,KLOR-CON) 20 MEQ tablet Take 1 tablet (20 mEq total) by mouth 2 (two) times daily.   . prochlorperazine (COMPAZINE) 10 MG tablet Take 1 tablet (10 mg total) by mouth every 6 (six) hours as needed for nausea or vomiting.   . rosuvastatin (CRESTOR) 20 MG tablet Take 1 tablet (20 mg total) by mouth daily.   . simethicone (MYLICON) 80 MG chewable tablet Chew 2 tablets (160 mg total) by mouth 4 (four) times daily as needed for flatulence.   . sucralfate (CARAFATE) 1 GM/10ML suspension Take 10 mLs (1 g total) by mouth 4 (four) times daily -  with meals and at bedtime.   . Trastuzumab (HERCEPTIN IV) Inject into the vein every 21 ( twenty-one) days.    . [EXPIRED] 0.9 %  sodium  chloride infusion    . [EXPIRED] acetaminophen (TYLENOL) tablet 650 mg    . [EXPIRED] calcium gluconate 2 g in sodium chloride 0.9 % 100 mL IVPB    . [EXPIRED] dexamethasone (DECADRON) injection 10 mg    . [EXPIRED] diphenhydrAMINE (BENADRYL) capsule 50 mg    . [EXPIRED] DOCEtaxel (TAXOTERE) 110 mg in dextrose 5 % 250 mL chemo infusion    . [EXPIRED] heparin lock flush 100 unit/mL    . [EXPIRED] pegfilgrastim (NEULASTA ONPRO KIT)  injection 6 mg    . [EXPIRED] pertuzumab (PERJETA) 420 mg in sodium chloride 0.9 % 250 mL chemo infusion    . [EXPIRED] trastuzumab (HERCEPTIN) 420 mg in sodium chloride 0.9 % 250 mL chemo infusion    . [DISCONTINUED] sodium chloride flush (NS) 0.9 % injection 10 mL     No facility-administered encounter medications on file as of 05/15/2016.     ALLERGIES:  No Known Allergies   PHYSICAL EXAM:  ECOG Performance status: 1 - Symptomatic, but independent.      Physical Exam  Constitutional: She is oriented to person, place, and time and well-developed, well-nourished, and in no distress.  HENT:  Head: Normocephalic.  Mouth/Throat: Oropharynx is clear and moist. No oropharyngeal exudate.  Eyes: Conjunctivae are normal. Pupils are equal, round, and reactive to light. No scleral icterus.  Neck: Normal range of motion. Neck supple.  Cardiovascular: Normal rate, regular rhythm and normal heart sounds.   Pulmonary/Chest: Effort normal. No respiratory distress. She has no wheezes.  Diminished breath sounds  Abdominal: Soft. Bowel sounds are normal. There is no tenderness.  Musculoskeletal: Normal range of motion. She exhibits no edema.  Lymphadenopathy:    She has no cervical adenopathy.       Right: No supraclavicular adenopathy present.       Left: No supraclavicular adenopathy present.  Neurological: She is alert and oriented to person, place, and time. No cranial nerve deficit. Gait normal.  Skin: Skin is warm and dry. No rash noted.  Psychiatric: Mood, memory, affect and judgment normal.  Nursing note and vitals reviewed.    LABORATORY DATA:  I have reviewed the labs as listed.  CBC    Component Value Date/Time   WBC 27.5 (H) 05/15/2016 0835   RBC 3.86 (L) 05/15/2016 0835   HGB 11.5 (L) 05/15/2016 0835   HGB 10.1 (L) 11/07/2010 0917   HCT 36.0 05/15/2016 0835   HCT 41.3 12/27/2015 1553   HCT 31.1 (L) 11/07/2010 0917   PLT 276 05/15/2016 0835   PLT 406 (H) 12/27/2015  1553   MCV 93.3 05/15/2016 0835   MCV 92 12/27/2015 1553   MCV 100.3 11/07/2010 0917   MCH 29.8 05/15/2016 0835   MCHC 31.9 05/15/2016 0835   RDW 18.6 (H) 05/15/2016 0835   RDW 14.8 12/27/2015 1553   RDW 18.3 (H) 11/07/2010 0917   LYMPHSABS 0.6 (L) 05/15/2016 0835   LYMPHSABS 1.9 12/27/2015 1553   LYMPHSABS 1.6 11/07/2010 0917   MONOABS 1.4 (H) 05/15/2016 0835   MONOABS 1.4 (H) 11/07/2010 0917   EOSABS 0.0 05/15/2016 0835   EOSABS 0.2 12/27/2015 1553   BASOSABS 0.0 05/15/2016 0835   BASOSABS 0.0 12/27/2015 1553   BASOSABS 0.0 11/07/2010 0917   CMP Latest Ref Rng & Units 05/15/2016 05/15/2016 04/24/2016  Glucose 65 - 99 mg/dL 132(H) - 234(H)  BUN 6 - 20 mg/dL 13 - 13  Creatinine 0.44 - 1.00 mg/dL 0.48 - 0.54  Sodium 135 - 145 mmol/L 137 - 137  Potassium 3.5 - 5.1 mmol/L 3.6 - 3.9  Chloride 101 - 111 mmol/L 98(L) - 100(L)  CO2 22 - 32 mmol/L 29 - 26  Calcium 8.7 - 10.3 mg/dL 7.2(L) 7.7(L) 7.6(L)  Total Protein 6.5 - 8.1 g/dL 6.9 - 6.6  Total Bilirubin 0.3 - 1.2 mg/dL 0.4 - 0.2(L)  Alkaline Phos 38 - 126 U/L 127(H) - 125  AST 15 - 41 U/L 23 - 20  ALT 14 - 54 U/L 20 - 18   Results for Sheila Garrison, Sheila Garrison (MRN [id number])   Ref. Range 05/15/2016 08:35  Phosphorus Latest Ref Range: 2.5 - 4.6 mg/dL 1.5 (L)  Magnesium Latest Ref Range: 1.7 - 2.4 mg/dL 1.1 (L)    PENDING LABS:  PTH pending.    DIAGNOSTIC IMAGING:  Most recent PET scan: 01/23/16      PATHOLOGY:  (L) breast mastectomy surgical path: 12/16/15        Liver biopsy: 02/10/16    ASSESSMENT & PLAN:   Stage IV invasive ductal carcinoma breast cancer with liver and bone mets, ER+/PR+/HER2+:  -Significant history of breast cancer.  (per Kirby Crigler, PA-C's last note)   -Now on Taxotere/Herceptin/Perjeta since 03/06/16. Tolerating therapy quite well.  -Last MUGA scan 04/10/16; EF 56.2%.  -Labs reviewed and are adequate for treatment today as scheduled; cycle #4 chemo today.  -Due for restaging imaging prior  to next cycle of chemotherapy with PET scan; orders placed today.    Hypocalcemia:  -Calcium serially low; could be secondary to hyperparathyroidism or hyperphosphatemia. PTH pending; phosphorus was actually low (see below).  Hypocalcemia differential diagnoses considered: hypoalbuminemia (albumin normal), hyperparathyroidism (PTH pending), chronic kidney disease (creatinine and EGFR normal), hyperphosphatemia (serum phos actually low), magnesium depletion (magnesium collected and low today), medications (she is not on calcium chelating drugs), bisphosphonates (has not received Xgeva since 03/06/16 d/t low calcium), vitamin D deficiency (will check with labs next week) or osteoblastic metastases (worsening bone mets could be be contributing to hypocalcemia, although we generally see hypercalcemia with this; PET scan upcoming).   -Serum calcium low 7.7 today.  She does have mild paresthesias to her hands/feet.  -We have been unable to give Xgeva for bone mets given low calcium.  -Discussed with Dr. Talbert Cage. We will replete with 2 gm IV calcium gluconate today. -Will check PTH, magnesium, phosphorus today as well.    Bone mets:  -Last Xgeva given 03/06/16; held since that time d/t hypocalcemia.  Oncology Flowsheet 03/06/2016  denosumab (XGEVA) [place] 120 mg  -Return to cancer center next week to recheck CMET to see if calcium adequate for Xgeva injection. I will also add vitamin D to be checked at that visit; orders placed today.    Hypomagnesemia/Hypophosphatemia:  -Magnesium and phosphorus labs added on to labs today given continued hypocalcemia. Both magnesium and phosphorus found to be low.  -Will replete magnesium with 2 gm IV Mag Sulfate during chemotherapy infusion today.  -Will give 1 dose of 500 mg K-Phos oral supplementation for low phos.     Dispo:  -Return to cancer center next week to re-challenge Xgeva injection (if serum calcium adequate to be given). Will check serum vitamin D level at  that time as well; if deficient, could be contributing to hypocalcemia despite oral calcium supplementation.  -PET scan in 2 weeks before next cycle of chemotherapy.  -Return to cancer center in 3 weeks to meet with Dr. Talbert Cage to review PET scan results and discuss further treatment plan.     All questions  were answered to patient's stated satisfaction. Encouraged patient to call with any new concerns or questions before her next visit to the cancer center and we can certain see her sooner, if needed.    Plan of care discussed with Dr. Talbert Cage, who agrees with the above aforementioned.    Orders placed this encounter:  Orders Placed This Encounter  Procedures  . NM PET Image Restag (PS) Skull Base To Thigh  . Magnesium  . Phosphorus  . VITAMIN D 25 Hydroxy (Vit-D Deficiency, Fractures)  . Comprehensive metabolic panel      Mike Craze, NP [place] [phone]

## 2016-05-16 ENCOUNTER — Other Ambulatory Visit (HOSPITAL_COMMUNITY): Payer: Self-pay | Admitting: Oncology

## 2016-05-16 LAB — PTH, INTACT AND CALCIUM
Calcium, Total (PTH): 7.2 mg/dL — ABNORMAL LOW (ref 8.7–10.3)
PTH: 110 pg/mL — ABNORMAL HIGH (ref 15–65)

## 2016-05-22 ENCOUNTER — Other Ambulatory Visit (HOSPITAL_COMMUNITY): Payer: PRIVATE HEALTH INSURANCE

## 2016-05-22 ENCOUNTER — Encounter (HOSPITAL_COMMUNITY): Payer: PRIVATE HEALTH INSURANCE

## 2016-05-22 DIAGNOSIS — C50919 Malignant neoplasm of unspecified site of unspecified female breast: Secondary | ICD-10-CM | POA: Diagnosis not present

## 2016-05-22 DIAGNOSIS — C50912 Malignant neoplasm of unspecified site of left female breast: Secondary | ICD-10-CM

## 2016-05-22 DIAGNOSIS — C787 Secondary malignant neoplasm of liver and intrahepatic bile duct: Secondary | ICD-10-CM

## 2016-05-22 DIAGNOSIS — C7951 Secondary malignant neoplasm of bone: Secondary | ICD-10-CM

## 2016-05-22 LAB — COMPREHENSIVE METABOLIC PANEL
ALBUMIN: 3.5 g/dL (ref 3.5–5.0)
ALK PHOS: 141 U/L — AB (ref 38–126)
ALT: 19 U/L (ref 14–54)
ANION GAP: 10 (ref 5–15)
AST: 20 U/L (ref 15–41)
BILIRUBIN TOTAL: 0.4 mg/dL (ref 0.3–1.2)
BUN: 10 mg/dL (ref 6–20)
CALCIUM: 8.5 mg/dL — AB (ref 8.9–10.3)
CO2: 28 mmol/L (ref 22–32)
CREATININE: 0.59 mg/dL (ref 0.44–1.00)
Chloride: 97 mmol/L — ABNORMAL LOW (ref 101–111)
GFR calc Af Amer: >60 mL/min (ref >60)
GFR calc non Af Amer: >60 mL/min (ref >60)
GLUCOSE: 133 mg/dL — AB (ref 65–99)
Potassium: 3.1 mmol/L — ABNORMAL LOW (ref 3.5–5.1)
SODIUM: 135 mmol/L (ref 135–145)
TOTAL PROTEIN: 6.7 g/dL (ref 6.5–8.1)

## 2016-05-22 MED ORDER — HEPARIN SOD (PORK) LOCK FLUSH 100 UNIT/ML IV SOLN
500.0000 [IU] | Freq: Once | INTRAVENOUS | Status: AC
  Administered 2016-05-22: 500 [IU] via INTRAVENOUS

## 2016-05-22 MED ORDER — HEPARIN SOD (PORK) LOCK FLUSH 100 UNIT/ML IV SOLN
INTRAVENOUS | Status: AC
  Filled 2016-05-22: qty 5

## 2016-05-22 NOTE — Progress Notes (Signed)
Delton See held today per MD. Calcium still low.Patient states that she is taking her calcium twice a day, and tums as well. Will reschedule for next week. Patient discharged home from clinic ambulatory.follow up as scheduled.

## 2016-05-22 NOTE — Patient Instructions (Signed)
[employer] at [facility] Discharge Instructions  RECOMMENDATIONS MADE BY THE CONSULTANT AND ANY TEST RESULTS WILL BE SENT TO YOUR REFERRING PHYSICIAN.  No xgeva today Follow up as scheduled.  Thank you for choosing [place] at [facility] to provide your oncology and hematology care.  To afford each patient quality time with our provider, please arrive at least 15 minutes before your scheduled appointment time.    If you have a lab appointment with the [facility] please come in thru the  Main Entrance and check in at the main information desk  You need to re-schedule your appointment should you arrive 10 or more minutes late.  We strive to give you quality time with our providers, and arriving late affects you and other patients whose appointments are after yours.  Also, if you no show three or more times for appointments you may be dismissed from the clinic at the providers discretion.     Again, thank you for choosing [facility].  Our hope is that these requests will decrease the amount of time that you wait before being seen by our physicians.       _____________________________________________________________  Should you have questions after your visit to [facility], please contact our office at [phone] between the hours of 8:30 a.m. and 4:30 p.m.  Voicemails left after 4:30 p.m. will not be returned until the following business day.  For prescription refill requests, have your pharmacy contact our office.       Resources For Cancer Patients and their Caregivers ? American Cancer Society: Can assist with transportation, wigs, general needs, runs Look Good Feel Better.        5140611390 ? Cancer Care: Provides financial assistance, online support groups, medication/co-pay assistance.  1-800-813-HOPE 825 393 2838) ? [place] Assists [place] cancer patients and their  families through emotional , educational and financial support.  (913) 540-5118 ? [place] DSS Where to apply for food stamps, Medicaid and utility assistance. 316-886-9139 ? [employer]: Transportation to medical appointments. 618-161-6488 ? Social Security Administration: May apply for disability if have a Stage IV cancer. 437-527-9599 469-428-7851 ? [employer], Disability and Transit Services: Assists with nutrition, care and transit needs. [place] Support Programs: @10RELATIVEDAYS @ > Cancer Support Group  2nd Tuesday of the month 1pm-2pm, Journey Room  > Creative Journey  3rd Tuesday of the month 1130am-1pm, Journey Room  > Look Good Feel Better  1st Wednesday of the month 10am-12 noon, Journey Room (Call [place] to register [phone])

## 2016-05-23 ENCOUNTER — Other Ambulatory Visit (HOSPITAL_COMMUNITY): Payer: Self-pay | Admitting: Adult Health

## 2016-05-23 DIAGNOSIS — E559 Vitamin D deficiency, unspecified: Secondary | ICD-10-CM

## 2016-05-23 DIAGNOSIS — C7951 Secondary malignant neoplasm of bone: Secondary | ICD-10-CM

## 2016-05-23 DIAGNOSIS — C50912 Malignant neoplasm of unspecified site of left female breast: Secondary | ICD-10-CM

## 2016-05-23 LAB — VITAMIN D 25 HYDROXY (VIT D DEFICIENCY, FRACTURES): Vit D, 25-Hydroxy: 18.5 ng/mL — ABNORMAL LOW (ref 30.0–100.0)

## 2016-05-23 MED ORDER — VITAMIN D (ERGOCALCIFEROL) 1.25 MG (50000 UNIT) PO CAPS: 50000.0000 [IU] | ORAL_CAPSULE | ORAL | 0 refills | Status: DC

## 2016-05-28 ENCOUNTER — Ambulatory Visit (HOSPITAL_COMMUNITY): Payer: PRIVATE HEALTH INSURANCE

## 2016-05-29 ENCOUNTER — Encounter (HOSPITAL_COMMUNITY): Payer: Self-pay

## 2016-05-29 ENCOUNTER — Encounter (HOSPITAL_BASED_OUTPATIENT_CLINIC_OR_DEPARTMENT_OTHER): Payer: PRIVATE HEALTH INSURANCE

## 2016-05-29 VITALS — BP 126/70 | HR 98 | Temp 98.0°F | Resp 22

## 2016-05-29 DIAGNOSIS — C787 Secondary malignant neoplasm of liver and intrahepatic bile duct: Secondary | ICD-10-CM

## 2016-05-29 DIAGNOSIS — C50911 Malignant neoplasm of unspecified site of right female breast: Secondary | ICD-10-CM | POA: Diagnosis not present

## 2016-05-29 DIAGNOSIS — C7951 Secondary malignant neoplasm of bone: Secondary | ICD-10-CM

## 2016-05-29 DIAGNOSIS — C50912 Malignant neoplasm of unspecified site of left female breast: Secondary | ICD-10-CM

## 2016-05-29 DIAGNOSIS — C50919 Malignant neoplasm of unspecified site of unspecified female breast: Secondary | ICD-10-CM | POA: Diagnosis not present

## 2016-05-29 LAB — COMPREHENSIVE METABOLIC PANEL
ALBUMIN: 2.8 g/dL — AB (ref 3.5–5.0)
ALK PHOS: 134 U/L — AB (ref 38–126)
ALT: 19 U/L (ref 14–54)
AST: 18 U/L (ref 15–41)
Anion gap: 11 (ref 5–15)
BILIRUBIN TOTAL: 0.3 mg/dL (ref 0.3–1.2)
BUN: 12 mg/dL (ref 6–20)
CALCIUM: 7.9 mg/dL — AB (ref 8.9–10.3)
CO2: 27 mmol/L (ref 22–32)
CREATININE: 0.6 mg/dL (ref 0.44–1.00)
Chloride: 97 mmol/L — ABNORMAL LOW (ref 101–111)
GFR calc Af Amer: >60 mL/min (ref >60)
GFR calc non Af Amer: >60 mL/min (ref >60)
GLUCOSE: 144 mg/dL — AB (ref 65–99)
Potassium: 3.8 mmol/L (ref 3.5–5.1)
Sodium: 135 mmol/L (ref 135–145)
Total Protein: 6.5 g/dL (ref 6.5–8.1)

## 2016-05-29 MED ORDER — DENOSUMAB 120 MG/1.7ML ~~LOC~~ SOLN
120.0000 mg | Freq: Once | SUBCUTANEOUS | Status: AC
  Administered 2016-05-29: 120 mg via SUBCUTANEOUS
  Filled 2016-05-29: qty 1.7

## 2016-05-29 MED ORDER — HEPARIN SOD (PORK) LOCK FLUSH 100 UNIT/ML IV SOLN
500.0000 [IU] | Freq: Once | INTRAVENOUS | Status: AC
  Administered 2016-05-29: 500 [IU] via INTRAVENOUS

## 2016-05-29 MED ORDER — HEPARIN SOD (PORK) LOCK FLUSH 100 UNIT/ML IV SOLN
INTRAVENOUS | Status: AC
  Filled 2016-05-29: qty 5

## 2016-05-29 MED ORDER — SODIUM CHLORIDE 0.9% FLUSH
20.0000 mL | INTRAVENOUS | Status: DC | PRN
Start: 2016-05-29 — End: 2016-05-29
  Administered 2016-05-29: 20 mL via INTRAVENOUS
  Filled 2016-05-29: qty 20

## 2016-05-29 NOTE — Progress Notes (Signed)
MINAMI ARRIAGA tolerated Xgeva injection with port lab draw and flush well without complaints or incident. Labs reviewed with Milbert Coulter ,Pharmacist, who calculated corrected Calcium as 8.86 so pt was OK for Xgeva injection. Pt also denied any tooth,jaw or leg pain prior to administering this injection. Pt has been taking her Calcium as prescribed and will continue to do so. Pt discharged via wheelchair in satisfactory condition accompanied by a friend

## 2016-05-29 NOTE — Patient Instructions (Signed)
[place] at [facility] Discharge Instructions  RECOMMENDATIONS MADE BY THE CONSULTANT AND ANY TEST RESULTS WILL BE SENT TO YOUR REFERRING PHYSICIAN.  Labs drawn from portacath and flushed per protocol as well as Xgeva injection given today. Follow-up as scheduled. Call clinic for any questions or concerns  Thank you for choosing [place] at [facility] to provide your oncology and hematology care.  To afford each patient quality time with our provider, please arrive at least 15 minutes before your scheduled appointment time.    If you have a lab appointment with the [place] [facility] please come in thru the  Main Entrance and check in at the main information desk  You need to re-schedule your appointment should you arrive 10 or more minutes late.  We strive to give you quality time with our providers, and arriving late affects you and other patients whose appointments are after yours.  Also, if you no show three or more times for appointments you may be dismissed from the clinic at the providers discretion.     Again, thank you for choosing [facility].  Our hope is that these requests will decrease the amount of time that you wait before being seen by our physicians.       _____________________________________________________________  Should you have questions after your visit to [facility], please contact our office at [phone] between the hours of 8:30 a.m. and 4:30 p.m.  Voicemails left after 4:30 p.m. will not be returned until the following business day.  For prescription refill requests, have your pharmacy contact our office.       Resources For Cancer Patients and their Caregivers ? American Cancer Society: Can assist with transportation, wigs, general needs, runs Look Good Feel Better.        859-609-3459 ? Cancer Care: Provides financial assistance, online support groups, medication/co-pay  assistance.  1-800-813-HOPE (431)417-9213) ? [place] Assists [place] cancer patients and their families through emotional , educational and financial support.  308-703-6385 ? [place] DSS Where to apply for food stamps, Medicaid and utility assistance. 786-528-9831 ? [employer]: Transportation to medical appointments. 336-190-8555 ? Social Security Administration: May apply for disability if have a Stage IV cancer. 5080402379 (765)585-5757 ? [employer], Disability and Transit Services: Assists with nutrition, care and transit needs. [place] Support Programs: @10RELATIVEDAYS @ > Cancer Support Group  2nd Tuesday of the month 1pm-2pm, Journey Room  > Creative Journey  3rd Tuesday of the month 1130am-1pm, Journey Room  > Look Good Feel Better  1st Wednesday of the month 10am-12 noon, Journey Room (Call [place] to register [phone])

## 2016-06-01 ENCOUNTER — Other Ambulatory Visit (HOSPITAL_COMMUNITY): Payer: Self-pay | Admitting: Oncology

## 2016-06-01 DIAGNOSIS — T66XXXA Radiation sickness, unspecified, initial encounter: Secondary | ICD-10-CM

## 2016-06-01 DIAGNOSIS — K208 Other esophagitis: Principal | ICD-10-CM

## 2016-06-04 ENCOUNTER — Ambulatory Visit (HOSPITAL_COMMUNITY)
Admission: RE | Admit: 2016-06-04 | Discharge: 2016-06-04 | Disposition: A | Discharge status: Home / Self Care | Payer: PRIVATE HEALTH INSURANCE | Source: Ambulatory Visit | Attending: Adult Health | Admitting: Adult Health

## 2016-06-04 DIAGNOSIS — I2510 Atherosclerotic heart disease of native coronary artery without angina pectoris: Secondary | ICD-10-CM | POA: Diagnosis not present

## 2016-06-04 DIAGNOSIS — C7951 Secondary malignant neoplasm of bone: Secondary | ICD-10-CM | POA: Diagnosis not present

## 2016-06-04 DIAGNOSIS — C50912 Malignant neoplasm of unspecified site of left female breast: Secondary | ICD-10-CM | POA: Diagnosis not present

## 2016-06-04 DIAGNOSIS — C787 Secondary malignant neoplasm of liver and intrahepatic bile duct: Secondary | ICD-10-CM | POA: Insufficient documentation

## 2016-06-04 DIAGNOSIS — I700 Atherosclerosis of aorta: Secondary | ICD-10-CM | POA: Insufficient documentation

## 2016-06-04 LAB — GLUCOSE, CAPILLARY: Glucose-Capillary: 150 mg/dL — ABNORMAL HIGH (ref 65–99)

## 2016-06-04 MED ORDER — FLUDEOXYGLUCOSE F - 18 (FDG) INJECTION
7.9000 | Freq: Once | INTRAVENOUS | Status: AC | PRN
  Administered 2016-06-04: 7.9 via INTRAVENOUS

## 2016-06-05 ENCOUNTER — Encounter (HOSPITAL_COMMUNITY): Payer: Self-pay

## 2016-06-05 ENCOUNTER — Encounter (HOSPITAL_COMMUNITY): Payer: PRIVATE HEALTH INSURANCE | Attending: Oncology | Admitting: Oncology

## 2016-06-05 ENCOUNTER — Encounter (HOSPITAL_COMMUNITY): Payer: PRIVATE HEALTH INSURANCE | Attending: Oncology

## 2016-06-05 VITALS — BP 107/56 | HR 101 | Temp 97.8°F | Resp 18 | Wt 140.2 lb

## 2016-06-05 VITALS — BP 113/48 | HR 80 | Temp 97.9°F | Resp 18

## 2016-06-05 DIAGNOSIS — R634 Abnormal weight loss: Secondary | ICD-10-CM | POA: Diagnosis not present

## 2016-06-05 DIAGNOSIS — Z87891 Personal history of nicotine dependence: Secondary | ICD-10-CM | POA: Diagnosis not present

## 2016-06-05 DIAGNOSIS — C7951 Secondary malignant neoplasm of bone: Secondary | ICD-10-CM

## 2016-06-05 DIAGNOSIS — Z803 Family history of malignant neoplasm of breast: Secondary | ICD-10-CM | POA: Insufficient documentation

## 2016-06-05 DIAGNOSIS — C50912 Malignant neoplasm of unspecified site of left female breast: Secondary | ICD-10-CM | POA: Insufficient documentation

## 2016-06-05 DIAGNOSIS — C787 Secondary malignant neoplasm of liver and intrahepatic bile duct: Secondary | ICD-10-CM | POA: Diagnosis not present

## 2016-06-05 DIAGNOSIS — Z853 Personal history of malignant neoplasm of breast: Secondary | ICD-10-CM

## 2016-06-05 DIAGNOSIS — E785 Hyperlipidemia, unspecified: Secondary | ICD-10-CM | POA: Insufficient documentation

## 2016-06-05 DIAGNOSIS — F419 Anxiety disorder, unspecified: Secondary | ICD-10-CM | POA: Insufficient documentation

## 2016-06-05 DIAGNOSIS — Z5112 Encounter for antineoplastic immunotherapy: Secondary | ICD-10-CM

## 2016-06-05 DIAGNOSIS — I10 Essential (primary) hypertension: Secondary | ICD-10-CM | POA: Insufficient documentation

## 2016-06-05 DIAGNOSIS — Z8041 Family history of malignant neoplasm of ovary: Secondary | ICD-10-CM | POA: Insufficient documentation

## 2016-06-05 DIAGNOSIS — K1231 Oral mucositis (ulcerative) due to antineoplastic therapy: Secondary | ICD-10-CM

## 2016-06-05 DIAGNOSIS — Z5111 Encounter for antineoplastic chemotherapy: Secondary | ICD-10-CM

## 2016-06-05 DIAGNOSIS — Z801 Family history of malignant neoplasm of trachea, bronchus and lung: Secondary | ICD-10-CM | POA: Diagnosis not present

## 2016-06-05 DIAGNOSIS — G620 Drug-induced polyneuropathy: Secondary | ICD-10-CM | POA: Diagnosis not present

## 2016-06-05 LAB — COMPREHENSIVE METABOLIC PANEL
ALK PHOS: 165 U/L — AB (ref 38–126)
ALT: 43 U/L (ref 14–54)
ANION GAP: 10 (ref 5–15)
AST: 47 U/L — ABNORMAL HIGH (ref 15–41)
Albumin: 2.6 g/dL — ABNORMAL LOW (ref 3.5–5.0)
BUN: 27 mg/dL — ABNORMAL HIGH (ref 6–20)
CALCIUM: 9.2 mg/dL (ref 8.9–10.3)
CHLORIDE: 100 mmol/L — AB (ref 101–111)
CO2: 25 mmol/L (ref 22–32)
Creatinine, Ser: 0.66 mg/dL (ref 0.44–1.00)
GFR calc Af Amer: >60 mL/min (ref >60)
Glucose, Bld: 191 mg/dL — ABNORMAL HIGH (ref 65–99)
Potassium: 4.7 mmol/L (ref 3.5–5.1)
SODIUM: 135 mmol/L (ref 135–145)
Total Bilirubin: 0.3 mg/dL (ref 0.3–1.2)
Total Protein: 6.6 g/dL (ref 6.5–8.1)

## 2016-06-05 LAB — CBC WITH DIFFERENTIAL/PLATELET
Basophils Absolute: 0 10*3/uL (ref 0.0–0.1)
Basophils Relative: 0 %
EOS ABS: 0 10*3/uL (ref 0.0–0.7)
EOS PCT: 0 %
HCT: 30.2 % — ABNORMAL LOW (ref 36.0–46.0)
Hemoglobin: 9.5 g/dL — ABNORMAL LOW (ref 12.0–15.0)
Lymphocytes Relative: 2 %
Lymphs Abs: 0.4 10*3/uL — ABNORMAL LOW (ref 0.7–4.0)
MCH: 28.9 pg (ref 26.0–34.0)
MCHC: 31.5 g/dL (ref 30.0–36.0)
MCV: 91.8 fL (ref 78.0–100.0)
MONO ABS: 1.1 10*3/uL — AB (ref 0.1–1.0)
MONOS PCT: 7 %
Neutro Abs: 14.1 10*3/uL — ABNORMAL HIGH (ref 1.7–7.7)
Neutrophils Relative %: 91 %
PLATELETS: 452 10*3/uL — AB (ref 150–400)
RBC: 3.29 MIL/uL — ABNORMAL LOW (ref 3.87–5.11)
RDW: 18.6 % — ABNORMAL HIGH (ref 11.5–15.5)
WBC: 15.6 10*3/uL — ABNORMAL HIGH (ref 4.0–10.5)

## 2016-06-05 MED ORDER — TRASTUZUMAB CHEMO 150 MG IV SOLR: 6.0000 mg/kg | Freq: Once | INTRAVENOUS | Status: DC

## 2016-06-05 MED ORDER — DEXAMETHASONE SODIUM PHOSPHATE 10 MG/ML IJ SOLN
10.0000 mg | Freq: Once | INTRAMUSCULAR | Status: AC
  Administered 2016-06-05: 10 mg via INTRAVENOUS
  Filled 2016-06-05: qty 1

## 2016-06-05 MED ORDER — SODIUM CHLORIDE 0.9 % IV SOLN
Freq: Once | INTRAVENOUS | Status: AC
  Administered 2016-06-05: 10:00:00 via INTRAVENOUS

## 2016-06-05 MED ORDER — SODIUM CHLORIDE 0.9 % IV SOLN
6.0000 mg/kg | Freq: Once | INTRAVENOUS | Status: AC
  Administered 2016-06-05: 378 mg via INTRAVENOUS
  Filled 2016-06-05: qty 18

## 2016-06-05 MED ORDER — DOCETAXEL CHEMO INJECTION 160 MG/16ML
60.0000 mg/m2 | Freq: Once | INTRAVENOUS | Status: AC
  Administered 2016-06-05: 110 mg via INTRAVENOUS
  Filled 2016-06-05: qty 11

## 2016-06-05 MED ORDER — HEPARIN SOD (PORK) LOCK FLUSH 100 UNIT/ML IV SOLN
500.0000 [IU] | Freq: Once | INTRAVENOUS | Status: AC | PRN
  Administered 2016-06-05: 500 [IU]
  Filled 2016-06-05: qty 5

## 2016-06-05 MED ORDER — SODIUM CHLORIDE 0.9 % IV SOLN
420.0000 mg | Freq: Once | INTRAVENOUS | Status: AC
  Administered 2016-06-05: 420 mg via INTRAVENOUS
  Filled 2016-06-05: qty 14

## 2016-06-05 MED ORDER — SODIUM CHLORIDE 0.9% FLUSH
10.0000 mL | INTRAVENOUS | Status: DC | PRN
  Administered 2016-06-05: 10 mL
  Filled 2016-06-05: qty 10

## 2016-06-05 MED ORDER — PEGFILGRASTIM 6 MG/0.6ML ~~LOC~~ PSKT
6.0000 mg | PREFILLED_SYRINGE | Freq: Once | SUBCUTANEOUS | Status: AC
  Administered 2016-06-05: 6 mg via SUBCUTANEOUS
  Filled 2016-06-05: qty 0.6

## 2016-06-05 MED ORDER — DIPHENHYDRAMINE HCL 25 MG PO CAPS
50.0000 mg | ORAL_CAPSULE | Freq: Once | ORAL | Status: AC
  Administered 2016-06-05: 50 mg via ORAL
  Filled 2016-06-05: qty 2

## 2016-06-05 MED ORDER — ACETAMINOPHEN 325 MG PO TABS
650.0000 mg | ORAL_TABLET | Freq: Once | ORAL | Status: AC
  Administered 2016-06-05: 650 mg via ORAL
  Filled 2016-06-05: qty 2

## 2016-06-05 MED ORDER — MAGIC MOUTHWASH W/LIDOCAINE: 5.0000 mL | Freq: Four times a day (QID) | ORAL | 0 refills | Status: DC | PRN

## 2016-06-05 NOTE — Patient Instructions (Signed)
[facility] Discharge Instructions for Patients Receiving Chemotherapy   Beginning January 23rd 2017 lab work for the [facility] will be done in the  Main lab at [facility] on 1st floor. If you have a lab appointment with the [facility] please come in thru the  Main Entrance and check in at the main information desk   Today you received the following chemotherapy agents Taxotere,Herceptin and Perjeta as well as Neulasta on-pro. Follow-up as scheduled. Call clinic for any questions or concerns  To help prevent nausea and vomiting after your treatment, we encourage you to take your nausea medication   If you develop nausea and vomiting, or diarrhea that is not controlled by your medication, call the clinic.  The clinic phone number is [phone]. Office hours are Monday-Friday 8:30am-5:00pm.  BELOW ARE SYMPTOMS THAT SHOULD BE REPORTED IMMEDIATELY:  *FEVER GREATER THAN 101.0 F  *CHILLS WITH OR WITHOUT FEVER  NAUSEA AND VOMITING THAT IS NOT CONTROLLED WITH YOUR NAUSEA MEDICATION  *UNUSUAL SHORTNESS OF BREATH  *UNUSUAL BRUISING OR BLEEDING  TENDERNESS IN MOUTH AND THROAT WITH OR WITHOUT PRESENCE OF ULCERS  *URINARY PROBLEMS  *BOWEL PROBLEMS  UNUSUAL RASH Items with * indicate a potential emergency and should be followed up as soon as possible. If you have an emergency after office hours please contact your primary care physician or go to the nearest emergency department.  Please call the clinic during office hours if you have any questions or concerns.   You may also contact the Patient Navigator at [phone] should you have any questions or need assistance in obtaining follow up care.      Resources For Cancer Patients and their Caregivers ? American Cancer Society: Can assist with transportation, wigs, general needs, runs Look Good Feel Better.        ([phone] [phone] ? Cancer Care: Provides financial assistance, online support groups,  medication/co-pay assistance.  1-800-813-HOPE 514-855-1345) ? [place] Assists [place] cancer patients and their families through emotional , educational and financial support.  650 802 3625 ? [place] DSS Where to apply for food stamps, Medicaid and utility assistance. 306-606-3209 ? [employer]: Transportation to medical appointments. 484-810-4948 ? Social Security Administration: May apply for disability if have a Stage IV cancer. 434-191-0770 7257304891 ? [employer], Disability and Transit Services: Assists with nutrition, care and transit needs. [phone]

## 2016-06-05 NOTE — Progress Notes (Signed)
Sheila Garrison tolerated chemo tx with Neulasta on-pro well without complaints or incident. Labs reviewed with Dr. Talbert Cage prior to administering chemotherapy. Neulasta on-pro applied to pt's right arm with green indicator light flashing. VSS upon discharge. Pt discharged via wheelchair in satisfactory condition accompanied by a friend

## 2016-06-05 NOTE — Patient Instructions (Addendum)
[facility] at [facility] Discharge Instructions  RECOMMENDATIONS MADE BY THE CONSULTANT AND ANY TEST RESULTS WILL BE SENT TO YOUR REFERRING PHYSICIAN.  You were seen today by Dr. Louise Zhou Follow up in 3 weeks    Thank you for choosing  [facility] at [facility] to provide your oncology and hematology care.  To afford each patient quality time with our provider, please arrive at least 15 minutes before your scheduled appointment time.    If you have a lab appointment with the Cancer Center please come in thru the  Main Entrance and check in at the main information desk  You need to re-schedule your appointment should you arrive 10 or more minutes late.  We strive to give you quality time with our providers, and arriving late affects you and other patients whose appointments are after yours.  Also, if you no show three or more times for appointments you may be dismissed from the clinic at the providers discretion.     Again, thank you for choosing [facility].  Our hope is that these requests will decrease the amount of time that you wait before being seen by our physicians.       _____________________________________________________________  Should you have questions after your visit to [facility], please contact our office at [phone] between the hours of 8:30 a.m. and 4:30 p.m.  Voicemails left after 4:30 p.m. will not be returned until the following business day.  For prescription refill requests, have your pharmacy contact our office.       Resources For Cancer Patients and their Caregivers ? American Cancer Society: Can assist with transportation, wigs, general needs, runs Look Good Feel Better.        1-888-227-6333 ? Cancer Care: Provides financial assistance, online support groups, medication/co-pay assistance.  1-800-813-HOPE (4673) ? [facility] Assists [place]  cancer patients and their families through emotional , educational and financial support.  336-427-4357 ? [place] DSS Where to apply for food stamps, Medicaid and utility assistance. 336-342-1394 ? [employer]: Transportation to medical appointments. 336-347-2287 ? Social Security Administration: May apply for disability if have a Stage IV cancer. 336-342-7796 1-800-772-1213 ? [place] Aging, Disability and Transit Services: Assists with nutrition, care and transit needs. 336-349-2343  Cancer Center Support Programs: @10RELATIVEDAYS@ > Cancer Support Group  2nd Tuesday of the month 1pm-2pm, Journey Room  > Creative Journey  3rd Tuesday of the month 1130am-1pm, Journey Room  > Look Good Feel Better  1st Wednesday of the month 10am-12 noon, Journey Room (Call [employer] to register 1-800-395-5775)    

## 2016-06-05 NOTE — Progress Notes (Signed)
Sheila Garrison, [place] [id number]  Progress Note  Breast Cancer  CURRENT THERAPY: Docetaxel/Herceptin/Perjeta beginning on 03/06/2016.  Docetaxel dose reduced by 20% beginning on 03/27/2016.  INTERVAL HISTORY: Sheila Garrison 61 y.o. 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 AND Invasive lobular left breast cancer, Stage IIB (pT3pN2A), S/P left mastectomy by Dr. Arnoldo Morale on 12/16/2015, ER/PR+ 2%.  HER2 NEGATIVE.  Done prior to knowing about Stage IV disease. 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 Margot Chimes, systemic chemotherapy by Dr. Jana Hakim in 2012 and Herceptin x 52 weeks finishing on 07/10/2011, XRT (01/04/2011- 02/22/2011), and anti-endocrine therapy.    Stage III (T4N3) R Br cancer    06/21/2010 Initial Diagnosis    Stage III (T4N3) R Br cancer       01/04/2011 - 02/22/2011 Radiation Therapy    50 Gy       05/07/2014 Pathology Results    BCI testing- low risk of late recurrence (3.4% between years 5-10), high likelihood of benefit from extended endocrein therapy, and a 67% relative risk reduction when treated with extended endocrine therapy (27% versus 10.5%).       [name], stage 4, [name]   11/22/2015 Mammogram    Ultrasound-guided biopsy of the irregular hypoechoic area, with prominent shadowing, in the OUTER left breast at the 1 o'clock axis. 2. Ultrasound-guided biopsy of the irregular mass within the INNER left breast at the 9 o'clock axis. 3. Ultrasound-guided biopsy of 1 of the enlarged and amorphous lymph nodes in the left axilla.      11/22/2015 Imaging    Ultrasound-guided biopsy of the irregular hypoechoic area, with prominent shadowing, in the OUTER left breast at the 1 o'clock axis. 2. Ultrasound-guided biopsy of the  irregular mass within the INNER left breast at the 9 o'clock axis. 3. Ultrasound-guided biopsy of 1 of the enlarged and amorphous lymph nodes in the left axilla.      12/16/2015 Surgery    L modified radical mastectomy with Dr. Arnoldo Morale      12/18/2015 - 12/21/2015 Hospital Admission    Admit date: 12/18/2015 Admission diagnosis: altered MS, acute respiratory failure Additional comments: This likely was a combination of COPD exacerbation along with mild acute on chronic diastolic CHF with  echo shows a preserved EF of around 60%, he was treated with IV steroids and Lasix, she is much better but qualifies for home oxygen which will be provided      12/19/2015 Imaging    No evidence of pulmonary embolus. 2. Small right pleural effusion, with associated atelectasis. Peripheral scarring at the anterior right upper lobe. Mild bilateral emphysema noted. 3. Scattered coronary artery calcifications seen. 4. Postoperative change at the left chest wall, reflecting recent mastectomy, with vague collections of postoperative fluid seen. 5. Vague sclerotic change and heterogeneity at vertebral body T8 raises concern for sequelae of metastatic disease      01/23/2016 PET scan    1. Widespread multifocal metastatic disease in the visualized axial and appendicular skeleton. 2. Solitary metastatic lesion in the liver. Hypermetabolic irregular left axillary lymph node adjacent to the left axillary clips. 3. Activity in the left breast related to mastectomy. 4. Low-grade activity along the pleuroparenchymal thickening anteriorly in the right chest is probably related to prior therapy rather than current malignancy.  5. Probable mild compression fracture at T8 associated with the extensive tumor at this level. There is also bony destruction of the right ninth rib posteriorly. 6. Faintly hypermetabolic but small left external iliac lymph nodes merit surveillance  ADDENDUM: The original report was by  Dr. Van Clines. The following addendum is by Dr. Van Clines:  I have one other musculoskeletal focus of hypermetabolic activity to mention. In the right latissimus dorsi muscle, there is a small hypermetabolic focus with maximum SUV of 5.4, measuring 7 mm in diameter on image 89/4, compatible with a metastatic deposit.        02/02/2016 Imaging    MRI T-spine: 1. Extensive metastatic disease throughout the cervical and thoracic spine with a subtle pathologic compression fracture of T8. Tumor extends through the posterior margin of the T8 vertebra into the spinal canal without spinal cord compression at this time. 2. No other pathologic fractures of the thoracic spine at this time.      02/10/2016 Procedure    US biopsy of liver lesion      02/15/2016 Pathology Results    Liver, needle/core biopsy, right lobe METASTATIC CARCINOMA, CONSISTENT WITH BREAST DUCTAL CARCINOMA.      02/15/2016 - 02/28/2016 Chemotherapy    Letrozole 2.5 mg daily + Kisqali (600 mg) days 1-21 every 28 days       02/21/2016 Pathology Results    Breast Prognostic profile from liver biopsy: HER2 POSITIVE, ER/PR NEGATIVE (0%).      02/27/2016 - 03/09/2016 Radiation Therapy    Palliative XRT to T7-T11 with Dr. Lianne Cure; 30 Gy in 10 fractions.       02/28/2016 Treatment Plan Change    Due to HER2 positivity and ER/PR negativity from liver biopsy, change to systemic chemotherapy is recommended.      03/06/2016 -  Chemotherapy    Docetaxel/Herceptin/Perjeta with Neulasta support.      03/11/2016 - 03/15/2016 Hospital Admission    Admit date: 03/11/2016 Admission diagnosis: Febrile neutropenia Additional comments: COPD exacerbation      03/27/2016 Treatment Plan Change    Docetaxel dose reduced by 20%.      03/27/2016 Adverse Reaction    Demonstrating early signs of palmar-plantar erythrodysesthesia.      03/27/2016 Treatment Plan Change    Defer treatment 1 week.      04/10/2016 Imaging     MUGA- The left ventricular ejection fraction is equal to 56.2%. Normal left ventricular wall motion.      06/04/2016 PET scan    1. No residual hypermetabolism within previously hypermetabolic lesions in the left axilla, right latissimus dorsi muscle, anterior right pleural space, liver, left external iliac lymph node and bones. 2. Aortic atherosclerosis (ICD10-170.0). Coronary artery calcification.       Sheila Garrison presents to the clinic for continuing follow up. She is scheduled to receive cycle 5 Docetaxel q21d today. I personally reviewed and went over PET scans with the patient. PET scan results from 06/04/2016 demonstrated no residual hypermetabolism within previously hypermetabolic lesions in the left axilla, right latissimus dorsi muscle, anterior right pleural space, liver, left external iliac lymph node and bones.   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  This predates the start of most recent chemotherapy and is secondary to previous chemotherapy.  She notes that it is at baseline and no worse.  She does note some neuropathy of her fingertips only.  She reports that this is self-limiting only. She notes some cramping of her hands and legs intermittently.  She states having fatigue for the first 6 days after her chemo cycles. She starts to feel better after the 6th day.  She reports some shortness of breath.  It is well-controlled with her albuterol.  She notes her appetite is unchanged since she started chemotherapy. She notes losing 10 pounds since she started chemo. She notes she is not worried because she "still eats when she feels hungry." She notes she has chemo-induced diarrhea that resolves on it's own.  She denies chest pain and abdominal pain.    Review of Systems  Constitutional: Positive for malaise/fatigue.  Eyes: Negative.   Respiratory: Positive for shortness of breath.   Cardiovascular: Negative.  Negative for chest pain.    Gastrointestinal: Positive for diarrhea (chemo-induced; resolves on its own.). Negative for abdominal pain.  Genitourinary: Negative.   Musculoskeletal: Negative.   Skin: Negative.   Neurological: Positive for tingling (chemo-induced neuropathy).  Endo/Heme/Allergies: Negative.   Psychiatric/Behavioral: Negative.     Past Medical History:  Diagnosis Date  . Anxiety   . Bone metastases ([place]) 02/15/2016  . Breast CA ([place]) 07/01/2010   rt breast ca  . COPD (chronic obstructive pulmonary disease) ([place])   . Family history of breast cancer    Aunt on father's side  . Hyperlipemia   . Hypertension   . MRSA (methicillin resistant staph aureus) culture positive    in breast  . Neuropathy    bilateral toes  . Nipple discharge    with pain, infection, lump  . S/P radiation therapy 01/08/11 - 02/22/11   Right Chest Wall/ 50 Gy / 25 Fractions, Right [place]/ 50 gy/25 Fractions, Right Axillary Boost/500 cGy/25 Fractions, Right Chest Wall Boost/10 Gy/5 Fractions  . Stage III (T4N3) R Br cancer  06/21/2010   Letrozole 2.5 mg which she started on 03/09/2011.  This patient presented to my office on Jun 21, 2010 with a large ulcerated right breast mass and bleeding. She underwent additional workup at that time and a biopsy was done showing invasive breast cancer. She was staged and was found to have a T4  disease that was ER, PR, HER-2 positive. CA 2729 was normal at 31. She is undergoing chemotherapy wi  . Status post chemotherapy    6 cycles of carboplatin and docetaxel with trastuzumab every 21 days followed by surgery  . Tobacco abuse 06/22/2015  . Use of letrozole ([place]) 03/09/2011  . Wears dentures     Past Surgical History:  Procedure Laterality Date  . BREAST SURGERY  2012  . DILATION AND CURETTAGE OF UTERUS    . MASTECTOMY MODIFIED RADICAL Left 12/16/2015   Procedure: MASTECTOMY MODIFIED RADICAL;  Surgeon: Aviva Signs, MD;  Location: [facility];  Service: General;  Laterality: Left;  .  port a catheter insertion    . PORT-A-CATH REMOVAL Left 01/15/2013   Procedure: REMOVAL PORT-A-CATH;  Surgeon: Scherry Ran, MD;  Location: [facility];  Service: General;  Laterality: Left;  . PORTACATH PLACEMENT Right 01/16/2016   Procedure: INSERTION PORT-A-CATH;  Surgeon: Aviva Signs, MD;  Location: [facility];  Service: General;  Laterality: Right;  . TUBAL LIGATION      Family History  Problem Relation Age of Onset  . COPD Mother   . Diabetes Father   . Hypertension Father   . COPD Brother   . Cancer Maternal Aunt     ovarian - died of old age  . Cancer Paternal Aunt     had breast ca; died of of a different  cancer  . Lung cancer Brother     Social History   Social History  . Marital status: Married    Spouse name: N/A  . Number of children: N/A  . Years of education: N/A   Social History Main Topics  . Smoking status: Former Smoker    Packs/day: 0.25    Years: 30.00    Types: Cigarettes    Quit date: 12/15/2015  . Smokeless tobacco: Never Used  . Alcohol use No  . Drug use: No  . Sexual activity: Yes    Birth control/ protection: Post-menopausal   Other Topics Concern  . None   Social History Narrative  . None     PHYSICAL EXAMINATION  ECOG PERFORMANCE STATUS: 1 - Symptomatic but completely ambulatory  Vitals:   06/05/16 0900  BP: (!) 107/56  Pulse: (!) 101  Resp: 18  Temp: 97.8 F (36.6 C)   Filed Weights   06/05/16 0900  Weight: 140 lb 3.2 oz (63.6 kg)     Physical Exam  Constitutional: She is oriented to person, place, and time and well-developed, well-nourished, and in no distress.  HENT:  Head: Normocephalic and atraumatic.  Eyes: Conjunctivae and EOM are normal. Pupils are equal, round, and reactive to light.  Neck: Normal range of motion. Neck supple.  Cardiovascular: Normal rate, regular rhythm and normal heart sounds.   Pulmonary/Chest: Effort normal and breath sounds normal.  Abdominal: Soft. Bowel sounds are normal.    Musculoskeletal: Normal range of motion.  Neurological: She is alert and oriented to person, place, and time. Gait normal.  Skin: Skin is warm and dry.  Nursing note and vitals reviewed.    LABORATORY DATA: CBC    Component Value Date/Time   WBC 15.6 (H) 06/05/2016 0851   RBC 3.29 (L) 06/05/2016 0851   HGB 9.5 (L) 06/05/2016 0851   HGB 10.1 (L) 11/07/2010 0917   HCT 30.2 (L) 06/05/2016 0851   HCT 41.3 12/27/2015 1553   HCT 31.1 (L) 11/07/2010 0917   PLT 452 (H) 06/05/2016 0851   PLT 406 (H) 12/27/2015 1553   MCV 91.8 06/05/2016 0851   MCV 92 12/27/2015 1553   MCV 100.3 11/07/2010 0917   MCH 28.9 06/05/2016 0851   MCHC 31.5 06/05/2016 0851   RDW 18.6 (H) 06/05/2016 0851   RDW 14.8 12/27/2015 1553   RDW 18.3 (H) 11/07/2010 0917   LYMPHSABS 0.4 (L) 06/05/2016 0851   LYMPHSABS 1.9 12/27/2015 1553   LYMPHSABS 1.6 11/07/2010 0917   MONOABS 1.1 (H) 06/05/2016 0851   MONOABS 1.4 (H) 11/07/2010 0917   EOSABS 0.0 06/05/2016 0851   EOSABS 0.2 12/27/2015 1553   BASOSABS 0.0 06/05/2016 0851   BASOSABS 0.0 12/27/2015 1553   BASOSABS 0.0 11/07/2010 0917      Chemistry      Component Value Date/Time   NA 135 05/29/2016 1023   NA 140 12/27/2015 1553   K 3.8 05/29/2016 1023   CL 97 (L) 05/29/2016 1023   CO2 27 05/29/2016 1023   BUN 12 05/29/2016 1023   BUN 22 12/27/2015 1553   CREATININE 0.60 05/29/2016 1023      Component Value Date/Time   CALCIUM 7.9 (L) 05/29/2016 1023   CALCIUM 7.2 (L) 05/15/2016 1032   ALKPHOS 134 (H) 05/29/2016 1023   AST 18 05/29/2016 1023   ALT 19 05/29/2016 1023   BILITOT 0.3 05/29/2016 1023   BILITOT 0.2 10/07/2015 0859        PENDING LABS:   RADIOGRAPHIC  STUDIES: I have personally reviewed the radiological images as listed and agreed with the findings in the report.  NUCLEAR MEDICINE PET SKULL BASE TO THIGH 06/04/2016  IMPRESSION: 1. No residual hypermetabolism within previously hypermetabolic lesions in the left axilla, right  latissimus dorsi muscle, anterior right pleural space, liver, left external iliac lymph node and bones. 2. Aortic atherosclerosis (ICD10-170.0). Coronary artery calcification.    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  Complicated by a LEFT invasive lobular carcinoma, Stage IIB (pT3pN2A), S/P left mastectomy by Dr. Arnoldo Morale on 12/16/2015, ER/PR+ 2%.  HER2 NEGATIVE.  Done prior to knowing about Stage IV disease. AND History with right invasive ductal breast cancer, Stage IIIC, ER/PR+ and HER 2 POSITIVE in 2012 managed with right mastectomy, systemic chemotherapy, HER2 targeted therapy, XRT, and anti-endocrine therapy   She started palliative XRT to T8 pathologic fracture on 02/27/2016.  PLAN:  PET scan reviewed with the patient in detail, she's had a complete response with no evidence of any active disease. Results noted above.  Patient is doing well. Continue cycle 5 of Docetaxel/herceptin/perjeta.  RTC in 3 weeks for follow up and cycle 6 chemo.  THERAPY PLAN:  Systemic chemotherapy 6 cycles followed by transition to oral maintenance therapy with neratinib (nerlynx).    All questions were answered. The patient knows to call the clinic with any problems, questions or concerns. We can certainly see the patient much sooner if necessary.  This document serves as a record of services personally performed by Twana First, MD. It was created on her behalf by Shirlean Mylar, a trained medical scribe. The creation of this record is based on the scribe's personal observations and the provider's statements to them. This document has been checked and approved by the attending provider.  I have reviewed the above documentation for accuracy and completeness and I agree with the above.   This note is electronically signed by:  Mikey College 06/05/2016 9:11 AM

## 2016-06-22 ENCOUNTER — Other Ambulatory Visit (HOSPITAL_COMMUNITY): Payer: Self-pay | Admitting: *Deleted

## 2016-06-22 DIAGNOSIS — K1231 Oral mucositis (ulcerative) due to antineoplastic therapy: Secondary | ICD-10-CM

## 2016-06-22 MED ORDER — MAGIC MOUTHWASH W/LIDOCAINE: 5.0000 mL | Freq: Four times a day (QID) | ORAL | 0 refills | Status: DC | PRN

## 2016-06-26 ENCOUNTER — Other Ambulatory Visit (HOSPITAL_COMMUNITY): Payer: PRIVATE HEALTH INSURANCE

## 2016-06-26 ENCOUNTER — Encounter (HOSPITAL_BASED_OUTPATIENT_CLINIC_OR_DEPARTMENT_OTHER): Payer: PRIVATE HEALTH INSURANCE

## 2016-06-26 ENCOUNTER — Encounter (HOSPITAL_BASED_OUTPATIENT_CLINIC_OR_DEPARTMENT_OTHER): Payer: PRIVATE HEALTH INSURANCE | Admitting: Oncology

## 2016-06-26 ENCOUNTER — Encounter (HOSPITAL_COMMUNITY): Payer: Self-pay

## 2016-06-26 ENCOUNTER — Ambulatory Visit (HOSPITAL_COMMUNITY): Payer: PRIVATE HEALTH INSURANCE

## 2016-06-26 VITALS — BP 119/65 | HR 95 | Temp 97.5°F | Resp 18 | Wt 133.6 lb

## 2016-06-26 DIAGNOSIS — C787 Secondary malignant neoplasm of liver and intrahepatic bile duct: Secondary | ICD-10-CM

## 2016-06-26 DIAGNOSIS — C7951 Secondary malignant neoplasm of bone: Secondary | ICD-10-CM

## 2016-06-26 DIAGNOSIS — C50912 Malignant neoplasm of unspecified site of left female breast: Secondary | ICD-10-CM | POA: Diagnosis not present

## 2016-06-26 DIAGNOSIS — K1231 Oral mucositis (ulcerative) due to antineoplastic therapy: Secondary | ICD-10-CM

## 2016-06-26 DIAGNOSIS — Z5111 Encounter for antineoplastic chemotherapy: Secondary | ICD-10-CM

## 2016-06-26 LAB — COMPREHENSIVE METABOLIC PANEL
ALK PHOS: 95 U/L (ref 38–126)
ALT: 10 U/L — AB (ref 14–54)
AST: 18 U/L (ref 15–41)
Albumin: 3 g/dL — ABNORMAL LOW (ref 3.5–5.0)
Anion gap: 13 (ref 5–15)
BILIRUBIN TOTAL: 0.3 mg/dL (ref 0.3–1.2)
BUN: 17 mg/dL (ref 6–20)
CALCIUM: 9.6 mg/dL (ref 8.9–10.3)
CO2: 29 mmol/L (ref 22–32)
CREATININE: 0.75 mg/dL (ref 0.44–1.00)
Chloride: 96 mmol/L — ABNORMAL LOW (ref 101–111)
GFR calc non Af Amer: >60 mL/min (ref >60)
Glucose, Bld: 126 mg/dL — ABNORMAL HIGH (ref 65–99)
Potassium: 3.5 mmol/L (ref 3.5–5.1)
SODIUM: 138 mmol/L (ref 135–145)
TOTAL PROTEIN: 6.5 g/dL (ref 6.5–8.1)

## 2016-06-26 LAB — CBC WITH DIFFERENTIAL/PLATELET
Basophils Absolute: 0 10*3/uL (ref 0.0–0.1)
Basophils Relative: 0 %
Eosinophils Absolute: 0 10*3/uL (ref 0.0–0.7)
Eosinophils Relative: 0 %
HEMATOCRIT: 30.2 % — AB (ref 36.0–46.0)
HEMOGLOBIN: 9.5 g/dL — AB (ref 12.0–15.0)
LYMPHS ABS: 0.4 10*3/uL — AB (ref 0.7–4.0)
LYMPHS PCT: 3 %
MCH: 28.4 pg (ref 26.0–34.0)
MCHC: 31.5 g/dL (ref 30.0–36.0)
MCV: 90.1 fL (ref 78.0–100.0)
MONOS PCT: 3 %
Monocytes Absolute: 0.3 10*3/uL (ref 0.1–1.0)
NEUTROS ABS: 11.8 10*3/uL — AB (ref 1.7–7.7)
NEUTROS PCT: 94 %
Platelets: 407 10*3/uL — ABNORMAL HIGH (ref 150–400)
RBC: 3.35 MIL/uL — AB (ref 3.87–5.11)
RDW: 19.2 % — ABNORMAL HIGH (ref 11.5–15.5)
WBC: 12.5 10*3/uL — AB (ref 4.0–10.5)

## 2016-06-26 MED ORDER — DEXAMETHASONE SODIUM PHOSPHATE 10 MG/ML IJ SOLN
10.0000 mg | Freq: Once | INTRAMUSCULAR | Status: AC
  Administered 2016-06-26: 10 mg via INTRAVENOUS

## 2016-06-26 MED ORDER — PEGFILGRASTIM 6 MG/0.6ML ~~LOC~~ PSKT
6.0000 mg | PREFILLED_SYRINGE | Freq: Once | SUBCUTANEOUS | Status: AC
  Administered 2016-06-26: 6 mg via SUBCUTANEOUS
  Filled 2016-06-26: qty 0.6

## 2016-06-26 MED ORDER — HEPARIN SOD (PORK) LOCK FLUSH 100 UNIT/ML IV SOLN
500.0000 [IU] | Freq: Once | INTRAVENOUS | Status: AC | PRN
  Administered 2016-06-26: 500 [IU]
  Filled 2016-06-26: qty 5

## 2016-06-26 MED ORDER — NERATINIB MALEATE 40 MG PO TABS: 240.0000 mg | ORAL_TABLET | Freq: Every day | ORAL | 5 refills | Status: DC

## 2016-06-26 MED ORDER — DENOSUMAB 120 MG/1.7ML ~~LOC~~ SOLN
120.0000 mg | Freq: Once | SUBCUTANEOUS | Status: AC
  Administered 2016-06-26: 120 mg via SUBCUTANEOUS
  Filled 2016-06-26: qty 1.7

## 2016-06-26 MED ORDER — DEXAMETHASONE SODIUM PHOSPHATE 10 MG/ML IJ SOLN
INTRAMUSCULAR | Status: AC
  Filled 2016-06-26: qty 1

## 2016-06-26 MED ORDER — SODIUM CHLORIDE 0.9 % IV SOLN
Freq: Once | INTRAVENOUS | Status: AC
  Administered 2016-06-26: 11:00:00 via INTRAVENOUS

## 2016-06-26 MED ORDER — MAGIC MOUTHWASH W/LIDOCAINE: 5.0000 mL | Freq: Four times a day (QID) | ORAL | 1 refills | Status: DC | PRN

## 2016-06-26 MED ORDER — DOCETAXEL CHEMO INJECTION 160 MG/16ML
60.0000 mg/m2 | Freq: Once | INTRAVENOUS | Status: AC
  Administered 2016-06-26: 100 mg via INTRAVENOUS
  Filled 2016-06-26: qty 10

## 2016-06-26 NOTE — Patient Instructions (Signed)
[facility] at [facility] Discharge Instructions  RECOMMENDATIONS MADE BY THE CONSULTANT AND ANY TEST RESULTS WILL BE SENT TO YOUR REFERRING PHYSICIAN.  You were seen today by Dr. Louise Zhou    Thank you for choosing [facility] at [facility] to provide your oncology and hematology care.  To afford each patient quality time with our provider, please arrive at least 15 minutes before your scheduled appointment time.    If you have a lab appointment with the Cancer Center please come in thru the  Main Entrance and check in at the main information desk  You need to re-schedule your appointment should you arrive 10 or more minutes late.  We strive to give you quality time with our providers, and arriving late affects you and other patients whose appointments are after yours.  Also, if you no show three or more times for appointments you may be dismissed from the clinic at the providers discretion.     Again, thank you for choosing [facility].  Our hope is that these requests will decrease the amount of time that you wait before being seen by our physicians.       _____________________________________________________________  Should you have questions after your visit to [facility], please contact our office at [phone] between the hours of 8:30 a.m. and 4:30 p.m.  Voicemails left after 4:30 p.m. will not be returned until the following business day.  For prescription refill requests, have your pharmacy contact our office.       Resources For Cancer Patients and their Caregivers ? American Cancer Society: Can assist with transportation, wigs, general needs, runs Look Good Feel Better.        1-888-227-6333 ? Cancer Care: Provides financial assistance, online support groups, medication/co-pay assistance.  1-800-813-HOPE (4673) ? [facility] Assists [place] cancer patients and their  families through emotional , educational and financial support.  336-427-4357 ? [place] DSS Where to apply for food stamps, Medicaid and utility assistance. 336-342-1394 ? [facility]: Transportation to medical appointments. 336-347-2287 ? Social Security Administration: May apply for disability if have a Stage IV cancer. 336-342-7796 1-800-772-1213 ? [place] Aging, Disability and Transit Services: Assists with nutrition, care and transit needs. 336-349-2343  Cancer Center Support Programs: @10RELATIVEDAYS@ > Cancer Support Group  2nd Tuesday of the month 1pm-2pm, Journey Room  > Creative Journey  3rd Tuesday of the month 1130am-1pm, Journey Room  > Look Good Feel Better  1st Wednesday of the month 10am-12 noon, Journey Room (Call American Cancer Society to register 1-800-395-5775)    

## 2016-06-26 NOTE — Progress Notes (Signed)
Tolerated infusion w/o adverse reaction.  Alert, in no distress.  VSS.  Discharged ambulatory.  

## 2016-06-26 NOTE — Progress Notes (Signed)
Sheila Garrison, [place] [address]  Progress Note  Breast Cancer  CURRENT THERAPY: Docetaxel/Herceptin/Perjeta beginning on 03/06/2016.  Docetaxel dose reduced by 20% beginning on 03/27/2016.  INTERVAL HISTORY: [name] 61 y.o. 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 AND Invasive lobular left breast cancer, Stage IIB (pT3pN2A), S/P left mastectomy by Dr. Arnoldo Morale on 12/16/2015, ER/PR+ 2%.  HER2 NEGATIVE.  Done prior to knowing about Stage IV disease. 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 Margot Chimes, systemic chemotherapy by Dr. Jana Hakim in 2012 and Herceptin x 52 weeks finishing on 07/10/2011, XRT (01/04/2011- 02/22/2011), and anti-endocrine therapy.    Stage III (T4N3) R Br cancer    06/21/2010 Initial Diagnosis    Stage III (T4N3) R Br cancer       01/04/2011 - 02/22/2011 Radiation Therapy    50 Gy       05/07/2014 Pathology Results    BCI testing- low risk of late recurrence (3.4% between years 5-10), high likelihood of benefit from extended endocrein therapy, and a 67% relative risk reduction when treated with extended endocrine therapy (27% versus 10.5%).       [name], stage 4, left ([place])   11/22/2015 Mammogram    Ultrasound-guided biopsy of the irregular hypoechoic area, with prominent shadowing, in the OUTER left breast at the 1 o'clock axis. 2. Ultrasound-guided biopsy of the irregular mass within the INNER left breast at the 9 o'clock axis. 3. Ultrasound-guided biopsy of 1 of the enlarged and amorphous lymph nodes in the left axilla.      11/22/2015 Imaging    Ultrasound-guided biopsy of the irregular hypoechoic area, with prominent shadowing, in the OUTER left breast at the 1 o'clock axis. 2. Ultrasound-guided biopsy of  the irregular mass within the INNER left breast at the 9 o'clock axis. 3. Ultrasound-guided biopsy of 1 of the enlarged and amorphous lymph nodes in the left axilla.      12/16/2015 Surgery    L modified radical mastectomy with Dr. Arnoldo Morale      12/18/2015 - 12/21/2015 Hospital Admission    Admit date: 12/18/2015 Admission diagnosis: altered MS, acute respiratory failure Additional comments: This likely was a combination of COPD exacerbation along with mild acute on chronic diastolic CHF with  echo shows a preserved EF of around 60%, he was treated with IV steroids and Lasix, she is much better but qualifies for home oxygen which will be provided      12/19/2015 Imaging    No evidence of pulmonary embolus. 2. Small right pleural effusion, with associated atelectasis. Peripheral scarring at the anterior right upper lobe. Mild bilateral emphysema noted. 3. Scattered coronary artery calcifications seen. 4. Postoperative change at the left chest wall, reflecting recent mastectomy, with vague collections of postoperative fluid seen. 5. Vague sclerotic change and heterogeneity at vertebral body T8 raises concern for sequelae of metastatic disease      01/23/2016 PET scan    1. Widespread multifocal metastatic disease in the visualized axial and appendicular skeleton. 2. Solitary metastatic lesion in the liver. Hypermetabolic irregular left axillary lymph node adjacent to the left axillary clips. 3. Activity in the left breast related to mastectomy. 4. Low-grade activity along the pleuroparenchymal thickening anteriorly in the right chest is probably related to prior therapy rather than current  malignancy. 5. Probable mild compression fracture at T8 associated with the extensive tumor at this level. There is also bony destruction of the right ninth rib posteriorly. 6. Faintly hypermetabolic but small left external iliac lymph nodes merit surveillance  ADDENDUM: The original report  was by Dr. Walter Liebkemann. The following addendum is by Dr. Walter Liebkemann:  I have one other musculoskeletal focus of hypermetabolic activity to mention. In the right latissimus dorsi muscle, there is a small hypermetabolic focus with maximum SUV of 5.4, measuring 7 mm in diameter on image 89/4, compatible with a metastatic deposit.        02/02/2016 Imaging    MRI T-spine: 1. Extensive metastatic disease throughout the cervical and thoracic spine with a subtle pathologic compression fracture of T8. Tumor extends through the posterior margin of the T8 vertebra into the spinal canal without spinal cord compression at this time. 2. No other pathologic fractures of the thoracic spine at this time.      02/10/2016 Procedure    US biopsy of liver lesion      02/15/2016 Pathology Results    Liver, needle/core biopsy, right lobe METASTATIC CARCINOMA, CONSISTENT WITH BREAST DUCTAL CARCINOMA.      02/15/2016 - 02/28/2016 Chemotherapy    Letrozole 2.5 mg daily + Kisqali (600 mg) days 1-21 every 28 days       02/21/2016 Pathology Results    Breast Prognostic profile from liver biopsy: HER2 POSITIVE, ER/PR NEGATIVE (0%).      02/27/2016 - 03/09/2016 Radiation Therapy    Palliative XRT to T7-T11 with Dr. Zagar; 30 Gy in 10 fractions.       02/28/2016 Treatment Plan Change    Due to HER2 positivity and ER/PR negativity from liver biopsy, change to systemic chemotherapy is recommended.      03/06/2016 -  Chemotherapy    Docetaxel/Herceptin/Perjeta with Neulasta support.      03/11/2016 - 03/15/2016 Hospital Admission    Admit date: 03/11/2016 Admission diagnosis: Febrile neutropenia Additional comments: COPD exacerbation      03/27/2016 Treatment Plan Change    Docetaxel dose reduced by 20%.      03/27/2016 Adverse Reaction    Demonstrating early signs of palmar-plantar erythrodysesthesia.      03/27/2016 Treatment Plan Change    Defer treatment 1 week.      04/10/2016 Imaging     MUGA- The left ventricular ejection fraction is equal to 56.2%. Normal left ventricular wall motion.      06/04/2016 PET scan    1. No residual hypermetabolism within previously hypermetabolic lesions in the left axilla, right latissimus dorsi muscle, anterior right pleural space, liver, left external iliac lymph node and bones. 2. Aortic atherosclerosis (ICD10-170.0). Coronary artery calcification.       Neysha Caccavale presents to the clinic for continuing follow up. She is scheduled to receive cycle 5 Docetaxel q21d today. I personally reviewed and went over PET scans with the patient. PET scan results from 06/04/2016 demonstrated no residual hypermetabolism within previously hypermetabolic lesions in the left axilla, right latissimus dorsi muscle, anterior right pleural space, liver, left external iliac lymph node and bones.  Today patient presents for follow-up. She is getting her last, cycle 6 chemotherapy. She states that after her last cycle, she is experienced decreased appetite and fatigue. She denies any chest pain, shortness of breath, abdominal pain, nausea, vomiting, diarrhea.   Review of Systems  Constitutional: Positive for malaise/fatigue and weight loss.  Eyes: Negative.   Respiratory: Negative for shortness   of breath.   Cardiovascular: Negative.  Negative for chest pain.  Gastrointestinal: Negative for abdominal pain and diarrhea (chemo-induced; resolves on its own.).  Genitourinary: Negative.   Musculoskeletal: Negative.   Skin: Negative.   Neurological: Positive for tingling (chemo-induced neuropathy).  Endo/Heme/Allergies: Negative.   Psychiatric/Behavioral: Negative.     Past Medical History:  Diagnosis Date  . Anxiety   . Bone metastases ([facility]) 02/15/2016  . Breast CA ([facility]) 07/01/2010   rt breast ca  . COPD (chronic obstructive pulmonary disease) ([facility])   . Family history of breast cancer    Aunt on father's side  . Hyperlipemia   . Hypertension   .  MRSA (methicillin resistant staph aureus) culture positive    in breast  . Neuropathy    bilateral toes  . Nipple discharge    with pain, infection, lump  . S/P radiation therapy 01/08/11 - 02/22/11   Right Chest Wall/ 50 Gy / 25 Fractions, Right [place]/ 50 gy/25 Fractions, Right Axillary Boost/500 cGy/25 Fractions, Right Chest Wall Boost/10 Gy/5 Fractions  . Stage III (T4N3) R Br cancer  06/21/2010   Letrozole 2.5 mg which she started on 03/09/2011.  This patient presented to my office on Jun 21, 2010 with a large ulcerated right breast mass and bleeding. She underwent additional workup at that time and a biopsy was done showing invasive breast cancer. She was staged and was found to have a T4  disease that was ER, PR, HER-2 positive. CA 2729 was normal at 31. She is undergoing chemotherapy wi  . Status post chemotherapy    6 cycles of carboplatin and docetaxel with trastuzumab every 21 days followed by surgery  . Tobacco abuse 06/22/2015  . Use of letrozole ([facility]) 03/09/2011  . Wears dentures     Past Surgical History:  Procedure Laterality Date  . BREAST SURGERY  2012  . DILATION AND CURETTAGE OF UTERUS    . MASTECTOMY MODIFIED RADICAL Left 12/16/2015   Procedure: MASTECTOMY MODIFIED RADICAL;  Surgeon: Aviva Signs, MD;  Location: [facility];  Service: General;  Laterality: Left;  . port a catheter insertion    . PORT-A-CATH REMOVAL Left 01/15/2013   Procedure: REMOVAL PORT-A-CATH;  Surgeon: Scherry Ran, MD;  Location: [facility];  Service: General;  Laterality: Left;  . PORTACATH PLACEMENT Right 01/16/2016   Procedure: INSERTION PORT-A-CATH;  Surgeon: Aviva Signs, MD;  Location: [facility];  Service: General;  Laterality: Right;  . TUBAL LIGATION      Family History  Problem Relation Age of Onset  . COPD Mother   . Diabetes Father   . Hypertension Father   . COPD Brother   . Cancer Maternal Aunt        ovarian - died of old age  . Cancer Paternal Aunt        had breast ca; died  of of a different cancer  . Lung cancer Brother     Social History   Social History  . Marital status: Married    Spouse name: N/A  . Number of children: N/A  . Years of education: N/A   Social History Main Topics  . Smoking status: Former Smoker    Packs/day: 0.25    Years: 30.00    Types: Cigarettes    Quit date: 12/15/2015  . Smokeless tobacco: Never Used  . Alcohol use No  . Drug use: No  . Sexual activity: Yes    Birth control/ protection: Post-menopausal   Other Topics Concern  .  None   Social History Narrative  . None     PHYSICAL EXAMINATION  ECOG PERFORMANCE STATUS: 1 - Symptomatic but completely ambulatory   Physical Exam  Constitutional: She is oriented to person, place, and time and well-developed, well-nourished, and in no distress. No distress.  HENT:  Head: Normocephalic and atraumatic.  Mouth/Throat: No oropharyngeal exudate.  Eyes: Conjunctivae and EOM are normal. Pupils are equal, round, and reactive to light. No scleral icterus.  Neck: Normal range of motion. Neck supple. No JVD present.  Cardiovascular: Normal rate, regular rhythm and normal heart sounds.  Exam reveals no gallop and no friction rub.   No murmur heard. Pulmonary/Chest: Effort normal and breath sounds normal. No respiratory distress. She has no wheezes. She has no rales.  Abdominal: Soft. Bowel sounds are normal. She exhibits no distension. There is no tenderness. There is no guarding.  Musculoskeletal: Normal range of motion. She exhibits no edema or tenderness.  Lymphadenopathy:    She has no cervical adenopathy.  Neurological: She is alert and oriented to person, place, and time. No cranial nerve deficit. Gait normal.  Skin: Skin is warm and dry. No rash noted. No erythema. No pallor.  Psychiatric: Affect and judgment normal.  Nursing note and vitals reviewed.    LABORATORY DATA: CBC    Component Value Date/Time   WBC 12.5 (H) 06/26/2016 0935   RBC 3.35 (L) 06/26/2016  0935   HGB 9.5 (L) 06/26/2016 0935   HGB 10.1 (L) 11/07/2010 0917   HCT 30.2 (L) 06/26/2016 0935   HCT 41.3 12/27/2015 1553   HCT 31.1 (L) 11/07/2010 0917   PLT 407 (H) 06/26/2016 0935   PLT 406 (H) 12/27/2015 1553   MCV 90.1 06/26/2016 0935   MCV 92 12/27/2015 1553   MCV 100.3 11/07/2010 0917   MCH 28.4 06/26/2016 0935   MCHC 31.5 06/26/2016 0935   RDW 19.2 (H) 06/26/2016 0935   RDW 14.8 12/27/2015 1553   RDW 18.3 (H) 11/07/2010 0917   LYMPHSABS 0.4 (L) 06/26/2016 0935   LYMPHSABS 1.9 12/27/2015 1553   LYMPHSABS 1.6 11/07/2010 0917   MONOABS 0.3 06/26/2016 0935   MONOABS 1.4 (H) 11/07/2010 0917   EOSABS 0.0 06/26/2016 0935   EOSABS 0.2 12/27/2015 1553   BASOSABS 0.0 06/26/2016 0935   BASOSABS 0.0 12/27/2015 1553   BASOSABS 0.0 11/07/2010 0917      Chemistry      Component Value Date/Time   NA 138 06/26/2016 0935   NA 140 12/27/2015 1553   K 3.5 06/26/2016 0935   CL 96 (L) 06/26/2016 0935   CO2 29 06/26/2016 0935   BUN 17 06/26/2016 0935   BUN 22 12/27/2015 1553   CREATININE 0.75 06/26/2016 0935      Component Value Date/Time   CALCIUM 9.6 06/26/2016 0935   CALCIUM 7.2 (L) 05/15/2016 1032   ALKPHOS 95 06/26/2016 0935   AST 18 06/26/2016 0935   ALT 10 (L) 06/26/2016 0935   BILITOT 0.3 06/26/2016 0935   BILITOT 0.2 10/07/2015 0859        PENDING LABS:   RADIOGRAPHIC STUDIES: I have personally reviewed the radiological images as listed and agreed with the findings in the report.  NUCLEAR MEDICINE PET SKULL BASE TO THIGH 06/04/2016  IMPRESSION: 1. No residual hypermetabolism within previously hypermetabolic lesions in the left axilla, right latissimus dorsi muscle, anterior right pleural space, liver, left external iliac lymph node and bones. 2. Aortic atherosclerosis (ICD10-170.0). Coronary artery calcification.    PATHOLOGY:  ASSESSMENT AND PLAN:  [name], stage 4, left ([facility]) Stage IV (pT3pN2ApM1) invasive  ductal carcinoma, biopsy proven to liver, HER2 POSITIVE, ER/PR NEGATIVE.  Complicated by a LEFT invasive lobular carcinoma, Stage IIB (pT3pN2A), S/P left mastectomy by Dr. Arnoldo Morale on 12/16/2015, ER/PR+ 2%.  HER2 NEGATIVE.  Done prior to knowing about Stage IV disease. AND History with right invasive ductal breast cancer, Stage IIIC, ER/PR+ and HER 2 POSITIVE in 2012 managed with right mastectomy, systemic chemotherapy, HER2 targeted therapy, XRT, and anti-endocrine therapy   She started palliative XRT to T8 pathologic fracture on 02/27/2016.  Chemo with docetaxel/herceptin/perjeta for 6 cycles: 03/06/16-06/26/16.  PLAN: Continue with cycle 6 of Docetaxel/herceptin/perjeta.  I have discussed with the patient starting maintenance neratinib (nerlynx) to 40 mg by mouth daily in about a month. I have given her reading material on the drug. And also discuss side effects including diarrhea. I will send in a prescription for Nerlynx, I have advised her not to take it until she sees me on the next visit. She verbalized understanding. She will need loperamide prophylaxis for diarrhea as well while she is on nerlynx.  RTC in 4 weeks for follow up with labs.  THERAPY PLAN:  Systemic chemotherapy 6 cycles followed by transition to oral maintenance therapy with neratinib (nerlynx).    All questions were answered. The patient knows to call the clinic with any problems, questions or concerns. We can certainly see the patient much sooner if necessary.  This document serves as a record of services personally performed by Twana First, MD. It was created on her behalf by Shirlean Mylar, a trained medical scribe. The creation of this record is based on the scribe's personal observations and the provider's statements to them. This document has been checked and approved by the attending provider.  I have reviewed the above documentation for accuracy and completeness and I agree with the above.   This note is  electronically signed by: Twana First, MD 06/26/2016 1:27 PM

## 2016-06-26 NOTE — Patient Instructions (Signed)
[facility] Discharge Instructions for Patients Receiving Chemotherapy   Beginning January 23rd 2017 lab work for the Cancer Center will be done in the  Main lab at [facility] on 1st floor. If you have a lab appointment with the Cancer Center please come in thru the  Main Entrance and check in at the main information desk   Today you received the following chemotherapy agents:  Taxotere  If you develop nausea and vomiting, or diarrhea that is not controlled by your medication, call the clinic.  The clinic phone number is [phone]. Office hours are Monday-Friday 8:30am-5:00pm.  BELOW ARE SYMPTOMS THAT SHOULD BE REPORTED IMMEDIATELY:  *FEVER GREATER THAN 101.0 F  *CHILLS WITH OR WITHOUT FEVER  NAUSEA AND VOMITING THAT IS NOT CONTROLLED WITH YOUR NAUSEA MEDICATION  *UNUSUAL SHORTNESS OF BREATH  *UNUSUAL BRUISING OR BLEEDING  TENDERNESS IN MOUTH AND THROAT WITH OR WITHOUT PRESENCE OF ULCERS  *URINARY PROBLEMS  *BOWEL PROBLEMS  UNUSUAL RASH Items with * indicate a potential emergency and should be followed up as soon as possible. If you have an emergency after office hours please contact your primary care physician or go to the nearest emergency department.  Please call the clinic during office hours if you have any questions or concerns.   You may also contact the Patient Navigator at [phone] should you have any questions or need assistance in obtaining follow up care.      Resources For Cancer Patients and their Caregivers ? American Cancer Society: Can assist with transportation, wigs, general needs, runs Look Good Feel Better.        1-888-227-6333 ? Cancer Care: Provides financial assistance, online support groups, medication/co-pay assistance.  1-800-813-HOPE (4673) ? [facility] Assists [place] cancer patients and their families through emotional , educational and financial support.   336-427-4357 ? [place] DSS Where to apply for food stamps, Medicaid and utility assistance. 336-342-1394 ? [facility]: Transportation to medical appointments. 336-347-2287 ? Social Security Administration: May apply for disability if have a Stage IV cancer. 336-342-7796 1-800-772-1213 ? [place] [facility]ging, Disability and Transit Services: Assists with nutrition, care and transit needs. 336-349-2343         

## 2016-06-27 ENCOUNTER — Telehealth (HOSPITAL_COMMUNITY): Payer: Self-pay | Admitting: Oncology

## 2016-06-27 NOTE — Telephone Encounter (Signed)
Faxed Nerlynx script to [facility] along with a copay card voucher.

## 2016-07-17 ENCOUNTER — Encounter (HOSPITAL_COMMUNITY): Payer: Self-pay | Admitting: Emergency Medicine

## 2016-07-17 MED ORDER — LOPERAMIDE HCL 2 MG PO TABS: ORAL_TABLET | ORAL | 3 refills | Status: AC

## 2016-07-17 NOTE — Patient Instructions (Signed)
[facility]   CHEMOTHERAPY INSTRUCTIONS  You are going to start on Nerlynx for your breast cancer for 1 year.  You will take 6 tablets (240 mg) at one time once a day with food.    POTENTIAL SIDE EFFECTS OF TREATMENT: What Neratinib Is Used For Breast Cancer Note: If a drug has been approved for one use, physicians may elect to use this same drug for other problems if they believe it may be helpful.  How Neratinib Is Given Neratinib tablets come in one strength, 40 mg.  Taken as a pill by mouth.  Take prescribed dose once daily by mouth with food.  Take at approximately the same time each day.  If you miss a dose of neratinib, resume therapy with the next scheduled daily dose; do not replace the missed dose.  Do not crush, chew, split, or dissolve tablets.  Side Effects Important things to remember about the side effects of neratinib: Most people will not experience all of the side effects listed for neratinib.  Neratinib side effects are often predictable in terms of their onset, duration, and severity.  Neratinib side effects will improve after therapy is complete.  Neratinib side effects may be manageable, there are many options to minimize or prevent the side effects of neratinib.  There is no relationship between the presence or severity of side effects and the effectiveness of the medication.  The side effects of neratinib and their severity depend on how much of the drug is given. In other words, high doses may produce more severe side effects.  The following side effects are common (occurring in greater than 30%) of patients taking neratinib: Diarrhea  Nausea and Vomiting  Stomach pain  These side effects are less common side effects (occurring in about 10-29%) of patients taking neratinib: Fatigue  Poor appetite  Skin rash  Muscle spasms  Pain or sores inside the mouth Not all side effects are listed above. Some that are rare  (occurring in less than 10% of patients) are not listed here. However, you should always inform your health care provider if you experience any unusual symptoms.  When To Contact Your Doctor or Health Care Provider The following symptoms require medical attention, but are not an emergency. Contact your health care provider within 24 hours of noticing any of the following: Signs of an allergic reaction such as rash, hives, itching, red, swollen, blistered, or peeling skin with or without fever. Wheezing, trouble breathing, talking, or swallowing, swelling of the mouth, face, lips, tongue, or throat.  Nausea (interferes with ability to eat and unrelieved with prescribed medication)  Vomiting (vomiting more than 4-5 times in a 24-hour period)  Diarrhea (more than 4-6 episodes in a 24-hour period)  Swelling of the belly  Black or tarry stools, or blood in your stools or urine  Extreme fatigue (unable to carry on self-care activities)  Confusion, muscle pain, weakness, abnormal heartbeat  Problems passing urine or a decrease in amount of urine passed  Signs of a urinary tract infection, burning or pain when passing urine, feeling the need to pass urine often or right away, fever.  Dark urine, feeling tired, not hungry, light colored stools, yellow skin or eyes. Always inform your health care provider if you experience any unusual symptoms.  Precautions Before starting neratinib treatment, make sure you tell your doctor about any other medications you are taking (including prescription, over-the-counter, vitamins, herbal remedies, etc.).  Avoid taking H2-receptor antagonists (famotidine,   cimetidine, ranitidine), proton pump inhibitors (omeprazole, lansoprazole, pantoprazole), colchicine, clotrimazole, or ciprofloxacin with neratinib. If needed, take antacids 3 hours before taking neratinib.  Avoid grapefruit juice while taking neratinib.  If you are taking digoxin, talk with your doctor. You may need  to have your blood work checked more closely while you are taking it with this drug.  Inform your health care professional if you are pregnant or may be pregnant prior to starting this treatment. Neratinib may cause fetal harm. Women of reproductive potential should have a pregnancy test prior to treatment. Effective contraception should be used during therapy and for at least 1 month after the last dose.  Female patients with female partners of reproductive potential should also use effective contraception during therapy and for at least 3 months after the last dose.  Breastfeeding is not recommended during therapy or for at least 1 month after the last dose.  Self-Care Tips Drink at least two to three quarts of fluid every 24 hours, unless you are instructed otherwise.  To reduce nausea, take anti-nausea medications as prescribed by your doctor, and eat small, frequent meals.  An anti-diarrhea medication may be needed during the first two cycles of neratinib treatment. Follow regimen of anti-diarrhea medications as prescribed by your health care professional.  Eat foods that may help reduce diarrhea (see managing side effects - diarrhea).  You may experience drowsiness or fatigue; avoid driving or engaging in tasks that require alertness until your response to the drug is known.  Get plenty of rest.  Maintain good nutrition.  Remain active as you are able. Gentle exercise is encouraged such as walking daily.  If you experience symptoms or side effects, be sure to discuss them with your health care team, they can prescribe medications and/or offer other suggestions that are effective in managing such problems.   SELF CARE ACTIVITIES WHILE ON CHEMOTHERAPY: Hydration Increase your fluid intake 48 hours prior to treatment and drink at least 8 to 12 cups (64 ounces) of water/decaff beverages per day after treatment. You can still have your cup of coffee or soda but these beverages do not count as part  of your 8 to 12 cups that you need to drink daily. No alcohol intake.  Medications Continue taking your normal prescription medication as prescribed.  If you start any new herbal or new supplements please let us know first to make sure it is safe.  Mouth Care Have teeth cleaned professionally before starting treatment. Keep dentures and partial plates clean. Use soft toothbrush and do not use mouthwashes that contain alcohol. Biotene is a good mouthwash that is available at most pharmacies or may be ordered by calling [phone]. Use warm salt water gargles (1 teaspoon salt per 1 quart warm water) before and after meals and at bedtime. Or you may rinse with 2 tablespoons of three-percent hydrogen peroxide mixed in eight ounces of water. If you are still having problems with your mouth or sores in your mouth please call the clinic. If you need dental work, please let the doctor know before you go for your appointment so that we can coordinate the best possible time for you in regards to your chemo regimen. You need to also let your dentist know that you are actively taking chemo. We may need to do labs prior to your dental appointment.   Skin Care Always use sunscreen that has not expired and with SPF (Sun Protection Factor) of 50 or higher. Wear hats to protect your   head from the sun. Remember to use sunscreen on your hands, ears, face, & feet.  Use good moisturizing lotions such as udder cream, eucerin, or even Vaseline. Some chemotherapies can cause dry skin, color changes in your skin and nails.    . Avoid long, hot showers or baths. . Use gentle, fragrance-free soaps and laundry detergent. . Use moisturizers, preferably creams or ointments rather than lotions because the thicker consistency is better at preventing skin dehydration. Apply the cream or ointment within 15 minutes of showering. Reapply moisturizer at night, and moisturize your hands every time after you wash them.  Hair Loss (if  your doctor says your hair will fall out)  . If your doctor says that your hair is likely to fall out, decide before you begin chemo whether you want to wear a wig. You may want to shop before treatment to match your hair color. . Hats, turbans, and scarves can also camouflage hair loss, although some people prefer to leave their heads uncovered. If you go bare-headed outdoors, be sure to use sunscreen on your scalp. . Cut your hair short. It eases the inconvenience of shedding lots of hair, but it also can reduce the emotional impact of watching your hair fall out. . Don't perm or color your hair during chemotherapy. Those chemical treatments are already damaging to hair and can enhance hair loss. Once your chemo treatments are done and your hair has grown back, it's OK to resume dyeing or perming hair. With chemotherapy, hair loss is almost always temporary. But when it grows back, it may be a different color or texture. In older adults who still had hair color before chemotherapy, the new growth may be completely gray.  Often, new hair is very fine and soft.  Infection Prevention Please wash your hands for at least 30 seconds using warm soapy water. Handwashing is the #1 way to prevent the spread of germs. Stay away from sick people or people who are getting over a cold. If you develop respiratory systems such as green/yellow mucus production or productive cough or persistent cough let us know and we will see if you need an antibiotic. It is a good idea to keep a pair of gloves on when going into grocery stores/[employer] to decrease your risk of coming into contact with germs on the carts, etc. Carry alcohol hand gel with you at all times and use it frequently if out in public. If your temperature reaches 100.5 or higher please call the clinic and let us know.  If it is after hours or on the weekend please go to the ER if your temperature is over 100.5.  Please have your own personal thermometer at home to  use.    Sex and bodily fluids If you are going to have sex, a condom must be used to protect the person that isn't taking chemotherapy. Chemo can decrease your libido (sex drive). For a few days after chemotherapy, chemotherapy can be excreted through your bodily fluids.  When using the toilet please close the lid and flush the toilet twice.  Do this for a few day after you have had chemotherapy.     Effects of chemotherapy on your sex life Some changes are simple and won't last long. They won't affect your sex life permanently. Sometimes you may feel: . too tired . not strong enough to be very active . sick or sore  . not in the mood . anxious or low Your anxiety might not   seem related to sex. For example, you may be worried about the cancer and how your treatment is going. Or you may be worried about money, or about how you family are coping with your illness. These things can cause stress, which can affect your interest in sex. It's important to talk to your partner about how you feel. Remember - the changes to your sex life don't usually last long. There's usually no medical reason to stop having sex during chemo. The drugs won't have any long term physical effects on your performance or enjoyment of sex. Cancer can't be passed on to your partner during sex  Contraception It's important to use reliable contraception during treatment. Avoid getting pregnant while you or your partner are having chemotherapy. This is because the drugs may harm the baby. Sometimes chemotherapy drugs can leave a man or woman infertile.  This means you would not be able to have children in the future. You might want to talk to someone about permanent infertility. It can be very difficult to learn that you may no longer be able to have children. Some people find counselling helpful. There might be ways to preserve your fertility, although this is easier for men than for women. You may want to speak to a fertility  expert. You can talk about sperm banking or harvesting your eggs. You can also ask about other fertility options, such as donor eggs. If you have or have had breast cancer, your doctor might advise you not to take the contraceptive pill. This is because the hormones in it might affect the cancer.  It is not known for sure whether or not chemotherapy drugs can be passed on through semen or secretions from the vagina. Because of this some doctors advise people to use a barrier method if you have sex during treatment. This applies to vaginal, anal or oral sex. Generally, doctors advise a barrier method only for the time you are actually having the treatment and for about a week after your treatment. Advice like this can be worrying, but this does not mean that you have to avoid being intimate with your partner. You can still have close contact with your partner and continue to enjoy sex.  Animals If you have cats or birds we just ask that you not change the litter or change the cage.  Please have someone else do this for you while you are on chemotherapy.   Food Safety During and After Cancer Treatment Food safety is important for people both during and after cancer treatment. Cancer and cancer treatments, such as chemotherapy, radiation therapy, and stem cell/bone marrow transplantation, often weaken the immune system. This makes it harder for your body to protect itself from foodborne illness, also called food poisoning. Foodborne illness is caused by eating food that contains harmful bacteria, parasites, or viruses.  Foods to avoid Some foods have a higher risk of becoming tainted with bacteria. These include: . Unwashed fresh fruit and vegetables, especially leafy vegetables that can hide dirt and other contaminants . Raw sprouts, such as alfalfa sprouts . Raw or undercooked beef, especially ground beef, or other raw or undercooked meat and poultry . Fatty, fried, or spicy foods immediately before  or after treatment.  These can sit heavy on your stomach and make you feel nauseous. . Raw or undercooked shellfish, such as oysters. . Sushi and sashimi, which often contain raw fish.  . Unpasteurized beverages, such as unpasteurized fruit juices, raw milk, raw yogurt, or cider .   Undercooked eggs, such as soft boiled, over easy, and poached; raw, unpasteurized eggs; or foods made with raw egg, such as homemade raw cookie dough and homemade mayonnaise Simple steps for food safety Shop smart. . Do not buy food stored or displayed in an unclean area. . Do not buy bruised or damaged fruits or vegetables. . Do not buy cans that have cracks, dents, or bulges. . Pick up foods that can spoil at the end of your shopping trip and store them in a cooler on the way home. Prepare and clean up foods carefully. . Rinse all fresh fruits and vegetables under running water, and dry them with a clean towel or paper towel. . Clean the top of cans before opening them. . After preparing food, wash your hands for 20 seconds with hot water and soap. Pay special attention to areas between fingers and under nails. . Clean your utensils and dishes with hot water and soap. . Disinfect your kitchen and cutting boards using 1 teaspoon of liquid, unscented bleach mixed into 1 quart of water.   Dispose of old food. . Eat canned and packaged food before its expiration date (the "use by" or "best before" date). . Consume refrigerated leftovers within 3 to 4 days. After that time, throw out the food. Even if the food does not smell or look spoiled, it still may be unsafe. Some bacteria, such as Listeria, can grow even on foods stored in the refrigerator if they are kept for too long. Take precautions when eating out. . At restaurants, avoid buffets and salad bars where food sits out for a long time and comes in contact with many people. Food can become contaminated when someone with a virus, often a norovirus, or another "bug"  handles it. . Put any leftover food in a "to-go" container yourself, rather than having the server do it. And, refrigerate leftovers as soon as you get home. . Choose restaurants that are clean and that are willing to prepare your food as you order it cooked.    MEDICATIONS: Imodium (loperamide) 4 mg:  Days 1 to 14:  Imodium 4 mg by mouth three times a day   Days 15 to 56:  Imodium 4 mg by mouth two times a day  Days 57 to 365:  Imodium 4 mg by mouth as needed (maximum dose 16 mg/day)                                                                                                                                                                Zofran/Ondansetron 8mg tablet. Take 1 tablet every 8 hours as needed for nausea/vomiting. (#1 nausea med to take, this can constipate)  Compazine/Prochlorperazine 10mg tablet. Take 1 tablet every 6 hours as needed for nausea/vomiting. (#2   nausea med to take, this can make you sleepy)    Nausea Sheet  Zofran/Ondansetron 8mg tablet. Take 1 tablet every 8 hours as needed for nausea/vomiting. (#1 nausea med to take, this can constipate)  Compazine/Prochlorperazine 10mg tablet. Take 1 tablet every 6 hours as needed for nausea/vomiting. (#2 nausea med to take, this can make you sleepy)  You can take these medications together or separately.  We would first like for you to try the Ondansetron by itself and then take the Prochloperizine if needed. But you are allowed to take both medications at the same time if your nausea is that severe.  If you are having persistent nausea (nausea that does not stop) please take these medications on a staggered schedule so that the nausea medication stays in your body.  Please call the Cancer Center and let us know the amount of nausea that you are experiencing.  If you begin to vomit, you need to call the Cancer Center and if it is the weekend and you have vomited more than one time and cant get it to stop-go to the Emergency  Room.  Persistent nausea/vomiting can lead to dehydration (loss of fluid in your body) and will make you feel terrible.   Ice chips, sips of clear liquids, foods that are @ room temperature, crackers, and toast tend to be better tolerated.     SYMPTOMS TO REPORT AS SOON AS POSSIBLE AFTER TREATMENT:  FEVER GREATER THAN 100.5 F  CHILLS WITH OR WITHOUT FEVER  NAUSEA AND VOMITING THAT IS NOT CONTROLLED WITH YOUR NAUSEA MEDICATION  UNUSUAL SHORTNESS OF BREATH  UNUSUAL BRUISING OR BLEEDING  TENDERNESS IN MOUTH AND THROAT WITH OR WITHOUT PRESENCE OF ULCERS  URINARY PROBLEMS  BOWEL PROBLEMS  UNUSUAL RASH    Wear comfortable clothing and clothing appropriate for easy access to any Portacath or PICC line. Let us know if there is anything that we can do to make your therapy better!    What to do if you need assistance after hours or on the weekends: CALL [phone].  HOLD on the line, do not hang up.  You will hear multiple messages but at the end you will be connected with a nurse triage line.  They will contact the doctor if necessary.  Most of the time they will be able to assist you.  Do not call the hospital operator.      I have been informed and understand all of the instructions given to me and have received a copy. I have been instructed to call the clinic [phone] or my family physician as soon as possible for continued medical care, if indicated. I do not have any more questions at this time but understand that I may call the Cancer Center or the Patient Navigator at [phone] during office hours should I have questions or need assistance in obtaining follow-up care.            

## 2016-07-17 NOTE — Progress Notes (Signed)
Chemotherapy teaching pulled together. 

## 2016-07-18 ENCOUNTER — Other Ambulatory Visit (HOSPITAL_COMMUNITY): Payer: PRIVATE HEALTH INSURANCE

## 2016-07-21 ENCOUNTER — Other Ambulatory Visit: Payer: Self-pay | Admitting: Family

## 2016-07-24 ENCOUNTER — Encounter (HOSPITAL_COMMUNITY): Payer: PRIVATE HEALTH INSURANCE | Attending: Hematology & Oncology | Admitting: Oncology

## 2016-07-24 ENCOUNTER — Other Ambulatory Visit (HOSPITAL_COMMUNITY): Payer: PRIVATE HEALTH INSURANCE

## 2016-07-24 ENCOUNTER — Encounter (HOSPITAL_COMMUNITY): Payer: PRIVATE HEALTH INSURANCE

## 2016-07-24 ENCOUNTER — Encounter (HOSPITAL_COMMUNITY): Payer: Self-pay

## 2016-07-24 VITALS — BP 127/57 | HR 112 | Temp 98.4°F | Resp 18 | Ht 64.5 in | Wt 144.0 lb

## 2016-07-24 DIAGNOSIS — C7951 Secondary malignant neoplasm of bone: Secondary | ICD-10-CM | POA: Insufficient documentation

## 2016-07-24 DIAGNOSIS — C50912 Malignant neoplasm of unspecified site of left female breast: Secondary | ICD-10-CM | POA: Diagnosis not present

## 2016-07-24 DIAGNOSIS — G620 Drug-induced polyneuropathy: Secondary | ICD-10-CM

## 2016-07-24 DIAGNOSIS — C787 Secondary malignant neoplasm of liver and intrahepatic bile duct: Secondary | ICD-10-CM | POA: Diagnosis not present

## 2016-07-24 DIAGNOSIS — K1231 Oral mucositis (ulcerative) due to antineoplastic therapy: Secondary | ICD-10-CM

## 2016-07-24 DIAGNOSIS — C50919 Malignant neoplasm of unspecified site of unspecified female breast: Secondary | ICD-10-CM | POA: Insufficient documentation

## 2016-07-24 MED ORDER — MAGIC MOUTHWASH W/LIDOCAINE: 5.0000 mL | Freq: Four times a day (QID) | ORAL | 1 refills | Status: AC | PRN

## 2016-07-24 NOTE — Progress Notes (Signed)
Chemotherapy teaching completed.  Consent signed.  Extensive teaching packet given.   

## 2016-07-24 NOTE — Progress Notes (Signed)
Sheila Garrison, [place] [address]  Progress Note  Breast Cancer  CURRENT THERAPY: Docetaxel/Herceptin/Perjeta beginning on 03/06/2016.  Docetaxel dose reduced by 20% beginning on 03/27/2016.  INTERVAL HISTORY: Sheila Garrison 61 y.o. 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 AND Invasive lobular left breast cancer, Stage IIB (pT3pN2A), S/P left mastectomy by Dr. Arnoldo Morale on 12/16/2015, ER/PR+ 2%.  HER2 NEGATIVE.  Done prior to knowing about Stage IV disease. 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 Margot Chimes, systemic chemotherapy by Dr. Jana Hakim in 2012 and Herceptin x 52 weeks finishing on 07/10/2011, XRT (01/04/2011- 02/22/2011), and anti-endocrine therapy.    Stage III (T4N3) R Br cancer    06/21/2010 Initial Diagnosis    Stage III (T4N3) R Br cancer       01/04/2011 - 02/22/2011 Radiation Therapy    50 Gy       05/07/2014 Pathology Results    BCI testing- low risk of late recurrence (3.4% between years 5-10), high likelihood of benefit from extended endocrein therapy, and a 67% relative risk reduction when treated with extended endocrine therapy (27% versus 10.5%).       [name], stage 4, left ([place])   11/22/2015 Mammogram    Ultrasound-guided biopsy of the irregular hypoechoic area, with prominent shadowing, in the OUTER left breast at the 1 o'clock axis. 2. Ultrasound-guided biopsy of the irregular mass within the INNER left breast at the 9 o'clock axis. 3. Ultrasound-guided biopsy of 1 of the enlarged and amorphous lymph nodes in the left axilla.      11/22/2015 Imaging    Ultrasound-guided biopsy of the irregular hypoechoic area, with prominent shadowing, in the OUTER left breast at the 1 o'clock axis. 2. Ultrasound-guided biopsy of  the irregular mass within the INNER left breast at the 9 o'clock axis. 3. Ultrasound-guided biopsy of 1 of the enlarged and amorphous lymph nodes in the left axilla.      12/16/2015 Surgery    L modified radical mastectomy with Dr. Arnoldo Morale      12/18/2015 - 12/21/2015 Hospital Admission    Admit date: 12/18/2015 Admission diagnosis: altered MS, acute respiratory failure Additional comments: This likely was a combination of COPD exacerbation along with mild acute on chronic diastolic CHF with  echo shows a preserved EF of around 60%, he was treated with IV steroids and Lasix, she is much better but qualifies for home oxygen which will be provided      12/19/2015 Imaging    No evidence of pulmonary embolus. 2. Small right pleural effusion, with associated atelectasis. Peripheral scarring at the anterior right upper lobe. Mild bilateral emphysema noted. 3. Scattered coronary artery calcifications seen. 4. Postoperative change at the left chest wall, reflecting recent mastectomy, with vague collections of postoperative fluid seen. 5. Vague sclerotic change and heterogeneity at vertebral body T8 raises concern for sequelae of metastatic disease      01/23/2016 PET scan    1. Widespread multifocal metastatic disease in the visualized axial and appendicular skeleton. 2. Solitary metastatic lesion in the liver. Hypermetabolic irregular left axillary lymph node adjacent to the left axillary clips. 3. Activity in the left breast related to mastectomy. 4. Low-grade activity along the pleuroparenchymal thickening anteriorly in the right chest is probably related to prior therapy rather than current  malignancy. 5. Probable mild compression fracture at T8 associated with the extensive tumor at this level. There is also bony destruction of the right ninth rib posteriorly. 6. Faintly hypermetabolic but small left external iliac lymph nodes merit surveillance  ADDENDUM: The original report  was by Dr. Van Clines. The following addendum is by Dr. Van Clines:  I have one other musculoskeletal focus of hypermetabolic activity to mention. In the right latissimus dorsi muscle, there is a small hypermetabolic focus with maximum SUV of 5.4, measuring 7 mm in diameter on image 89/4, compatible with a metastatic deposit.        02/02/2016 Imaging    MRI T-spine: 1. Extensive metastatic disease throughout the cervical and thoracic spine with a subtle pathologic compression fracture of T8. Tumor extends through the posterior margin of the T8 vertebra into the spinal canal without spinal cord compression at this time. 2. No other pathologic fractures of the thoracic spine at this time.      02/10/2016 Procedure    US biopsy of liver lesion      02/15/2016 Pathology Results    Liver, needle/core biopsy, right lobe METASTATIC CARCINOMA, CONSISTENT WITH BREAST DUCTAL CARCINOMA.      02/15/2016 - 02/28/2016 Chemotherapy    Letrozole 2.5 mg daily + Kisqali (600 mg) days 1-21 every 28 days       02/21/2016 Pathology Results    Breast Prognostic profile from liver biopsy: HER2 POSITIVE, ER/PR NEGATIVE (0%).      02/27/2016 - 03/09/2016 Radiation Therapy    Palliative XRT to T7-T11 with Dr. Lianne Cure; 30 Gy in 10 fractions.       02/28/2016 Treatment Plan Change    Due to HER2 positivity and ER/PR negativity from liver biopsy, change to systemic chemotherapy is recommended.      03/06/2016 -  Chemotherapy    Docetaxel/Herceptin/Perjeta with Neulasta support.      03/11/2016 - 03/15/2016 Hospital Admission    Admit date: 03/11/2016 Admission diagnosis: Febrile neutropenia Additional comments: COPD exacerbation      03/27/2016 Treatment Plan Change    Docetaxel dose reduced by 20%.      03/27/2016 Adverse Reaction    Demonstrating early signs of palmar-plantar erythrodysesthesia.      03/27/2016 Treatment Plan Change    Defer treatment 1 week.      04/10/2016 Imaging     MUGA- The left ventricular ejection fraction is equal to 56.2%. Normal left ventricular wall motion.      06/04/2016 PET scan    1. No residual hypermetabolism within previously hypermetabolic lesions in the left axilla, right latissimus dorsi muscle, anterior right pleural space, liver, left external iliac lymph node and bones. 2. Aortic atherosclerosis (ICD10-170.0). Coronary artery calcification.       Mercy Leppla presents to the clinic for continuing follow up with her friend. She completed cycle 6 of Roy on 06/26/16. She has recovered from the side effects of chemotherapy. She states her fatigue and anorexia which she experienced with her last cycle of chemo has improved. She states she has neck pain today after she slept on it wrong. Her chronic SOB is at her baseline and she uses home oxygen.   Review of Systems  Constitutional: Negative for chills, fever, malaise/fatigue and weight loss.  HENT: Negative for hearing loss, sore throat and tinnitus.   Eyes: Negative.  Negative for blurred vision, photophobia and discharge.  Respiratory: Negative for cough, hemoptysis, shortness of breath and wheezing.   Cardiovascular: Negative.  Negative for  chest pain, palpitations, orthopnea, claudication and leg swelling.  Gastrointestinal: Negative for abdominal pain, constipation, diarrhea (chemo-induced; resolves on its own.), melena, nausea and vomiting.  Genitourinary: Negative.  Negative for dysuria and hematuria.  Musculoskeletal: Positive for neck pain. Negative for back pain, joint pain and myalgias.  Skin: Negative.  Negative for itching and rash.  Neurological: Positive for tingling (chemo-induced neuropathy). Negative for dizziness, weakness and headaches.  Endo/Heme/Allergies: Negative.  Negative for environmental allergies and polydipsia. Does not bruise/bleed easily.  Psychiatric/Behavioral: Negative.  Negative for depression. The patient is not nervous/anxious and does not  have insomnia.     Past Medical History:  Diagnosis Date  . Anxiety   . Bone metastases ([facility]) 02/15/2016  . Breast CA ([facility]) 07/01/2010   rt breast ca  . COPD (chronic obstructive pulmonary disease) ([facility])   . Family history of breast cancer    Aunt on father's side  . Hyperlipemia   . Hypertension   . MRSA (methicillin resistant staph aureus) culture positive    in breast  . Neuropathy    bilateral toes  . Nipple discharge    with pain, infection, lump  . S/P radiation therapy 01/08/11 - 02/22/11   Right Chest Wall/ 50 Gy / 25 Fractions, Right [place]/ 50 gy/25 Fractions, Right Axillary Boost/500 cGy/25 Fractions, Right Chest Wall Boost/10 Gy/5 Fractions  . Stage III (T4N3) R Br cancer  06/21/2010   Letrozole 2.5 mg which she started on 03/09/2011.  This patient presented to my office on Jun 21, 2010 with a large ulcerated right breast mass and bleeding. She underwent additional workup at that time and a biopsy was done showing invasive breast cancer. She was staged and was found to have a T4  disease that was ER, PR, HER-2 positive. CA 2729 was normal at 31. She is undergoing chemotherapy wi  . Status post chemotherapy    6 cycles of carboplatin and docetaxel with trastuzumab every 21 days followed by surgery  . Tobacco abuse 06/22/2015  . Use of letrozole ([place]) 03/09/2011  . Wears dentures     Past Surgical History:  Procedure Laterality Date  . BREAST SURGERY  2012  . DILATION AND CURETTAGE OF UTERUS    . MASTECTOMY MODIFIED RADICAL Left 12/16/2015   Procedure: MASTECTOMY MODIFIED RADICAL;  Surgeon: Aviva Signs, MD;  Location: [facility];  Service: General;  Laterality: Left;  . port a catheter insertion    . PORT-A-CATH REMOVAL Left 01/15/2013   Procedure: REMOVAL PORT-A-CATH;  Surgeon: Scherry Ran, MD;  Location: [facility];  Service: General;  Laterality: Left;  . PORTACATH PLACEMENT Right 01/16/2016   Procedure: INSERTION PORT-A-CATH;  Surgeon: Aviva Signs, MD;  Location:  [facility];  Service: General;  Laterality: Right;  . TUBAL LIGATION      Family History  Problem Relation Age of Onset  . COPD Mother   . Diabetes Father   . Hypertension Father   . COPD Brother   . Cancer Maternal Aunt        ovarian - died of old age  . Cancer Paternal Aunt        had breast ca; died of of a different cancer  . Lung cancer Brother     Social History   Social History  . Marital status: Married    Spouse name: N/A  . Number of children: N/A  . Years of education: N/A   Social History Main Topics  . Smoking status: Former Smoker  Packs/day: 0.25    Years: 30.00    Types: Cigarettes    Quit date: 12/15/2015  . Smokeless tobacco: Never Used  . Alcohol use No  . Drug use: No  . Sexual activity: Yes    Birth control/ protection: Post-menopausal   Other Topics Concern  . Not on file   Social History Narrative  . No narrative on file     PHYSICAL EXAMINATION  ECOG PERFORMANCE STATUS: 1 - Symptomatic but completely ambulatory   Physical Exam  Constitutional: She is oriented to person, place, and time and well-developed, well-nourished, and in no distress. No distress.  HENT:  Head: Normocephalic and atraumatic.  Mouth/Throat: No oropharyngeal exudate.  Eyes: Conjunctivae and EOM are normal. Pupils are equal, round, and reactive to light. No scleral icterus.  Neck: Normal range of motion. Neck supple. No JVD present.  Cardiovascular: Normal rate, regular rhythm and normal heart sounds.  Exam reveals no gallop and no friction rub.   No murmur heard. Pulmonary/Chest: Effort normal and breath sounds normal. No respiratory distress. She has no wheezes. She has no rales.  Abdominal: Soft. Bowel sounds are normal. She exhibits no distension. There is no tenderness. There is no guarding.  Musculoskeletal: Normal range of motion. She exhibits no edema or tenderness.  Lymphadenopathy:    She has no cervical adenopathy.  Neurological: She is alert and  oriented to person, place, and time. No cranial nerve deficit. Gait normal.  Skin: Skin is warm and dry. No rash noted. No erythema. No pallor.  Psychiatric: Affect and judgment normal.  Nursing note and vitals reviewed.    LABORATORY DATA: CBC    Component Value Date/Time   WBC 12.5 (H) 06/26/2016 0935   RBC 3.35 (L) 06/26/2016 0935   HGB 9.5 (L) 06/26/2016 0935   HGB 13.5 12/27/2015 1553   HGB 10.1 (L) 11/07/2010 0917   HCT 30.2 (L) 06/26/2016 0935   HCT 41.3 12/27/2015 1553   HCT 31.1 (L) 11/07/2010 0917   PLT 407 (H) 06/26/2016 0935   PLT 406 (H) 12/27/2015 1553   MCV 90.1 06/26/2016 0935   MCV 92 12/27/2015 1553   MCV 100.3 11/07/2010 0917   MCH 28.4 06/26/2016 0935   MCHC 31.5 06/26/2016 0935   RDW 19.2 (H) 06/26/2016 0935   RDW 14.8 12/27/2015 1553   RDW 18.3 (H) 11/07/2010 0917   LYMPHSABS 0.4 (L) 06/26/2016 0935   LYMPHSABS 1.9 12/27/2015 1553   LYMPHSABS 1.6 11/07/2010 0917   MONOABS 0.3 06/26/2016 0935   MONOABS 1.4 (H) 11/07/2010 0917   EOSABS 0.0 06/26/2016 0935   EOSABS 0.2 12/27/2015 1553   BASOSABS 0.0 06/26/2016 0935   BASOSABS 0.0 12/27/2015 1553   BASOSABS 0.0 11/07/2010 0917      Chemistry      Component Value Date/Time   NA 138 06/26/2016 0935   NA 140 12/27/2015 1553   K 3.5 06/26/2016 0935   CL 96 (L) 06/26/2016 0935   CO2 29 06/26/2016 0935   BUN 17 06/26/2016 0935   BUN 22 12/27/2015 1553   CREATININE 0.75 06/26/2016 0935      Component Value Date/Time   CALCIUM 9.6 06/26/2016 0935   CALCIUM 7.2 (L) 05/15/2016 1032   ALKPHOS 95 06/26/2016 0935   AST 18 06/26/2016 0935   ALT 10 (L) 06/26/2016 0935   BILITOT 0.3 06/26/2016 0935   BILITOT 0.2 10/07/2015 0859        PENDING LABS:   RADIOGRAPHIC STUDIES: I have personally reviewed the  radiological images as listed and agreed with the findings in the report.  NUCLEAR MEDICINE PET SKULL BASE TO THIGH 06/04/2016  IMPRESSION: 1. No residual hypermetabolism within previously  hypermetabolic lesions in the left axilla, right latissimus dorsi muscle, anterior right pleural space, liver, left external iliac lymph node and bones. 2. Aortic atherosclerosis (ICD10-170.0). Coronary artery calcification.    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  Complicated by a LEFT invasive lobular carcinoma, Stage IIB (pT3pN2A), S/P left mastectomy by Dr. Arnoldo Morale on 12/16/2015, ER/PR+ 2%.  HER2 NEGATIVE.  Done prior to knowing about Stage IV disease. AND History with right invasive ductal breast cancer, Stage IIIC, ER/PR+ and HER 2 POSITIVE in 2012 managed with right mastectomy, systemic chemotherapy, HER2 targeted therapy, XRT, and anti-endocrine therapy   She started palliative XRT to T8 pathologic fracture on 02/27/2016.  Chemo with docetaxel/herceptin/perjeta for 6 cycles: 03/06/16-06/26/16.  PLAN: I have discussed with the patient starting maintenance neratinib (nerlynx) 240 mg by mouth daily and also discuss the side effects including diarrhea. Patient has received her Nerlynx, I have advised her to start taking it tomorrow. She will need loperamide prophylaxis for diarrhea as well while she is on nerlynx. Loperamide has been sent to her pharmacy. I have also given her a new Rx for magic mouthwash in case she gets mucositis. She had formal education regarding Nerlynx by our nurse navigator today.   RTC in 2 weeks for follow up with labs to assess tolerability to nerlynx.  THERAPY PLAN:  oral maintenance therapy with neratinib (nerlynx).    All questions were answered. The patient knows to call the clinic with any problems, questions or concerns. We can certainly see the patient much sooner if necessary.  vider.  I have reviewed the above documentation for accuracy and completeness and I agree with the  above.   This note is electronically signed by: Twana First, MD 07/24/2016 2:38 PM

## 2016-08-09 ENCOUNTER — Encounter (HOSPITAL_COMMUNITY): Payer: Self-pay

## 2016-08-09 ENCOUNTER — Encounter (HOSPITAL_COMMUNITY): Payer: PRIVATE HEALTH INSURANCE | Attending: Hematology & Oncology | Admitting: Oncology

## 2016-08-09 ENCOUNTER — Ambulatory Visit (HOSPITAL_COMMUNITY): Payer: PRIVATE HEALTH INSURANCE

## 2016-08-09 ENCOUNTER — Encounter (HOSPITAL_COMMUNITY): Payer: PRIVATE HEALTH INSURANCE

## 2016-08-09 VITALS — BP 143/88 | HR 102 | Temp 98.3°F | Resp 16 | Wt 147.8 lb

## 2016-08-09 DIAGNOSIS — Z170 Estrogen receptor positive status [ER+]: Secondary | ICD-10-CM

## 2016-08-09 DIAGNOSIS — C50912 Malignant neoplasm of unspecified site of left female breast: Secondary | ICD-10-CM

## 2016-08-09 DIAGNOSIS — C7951 Secondary malignant neoplasm of bone: Secondary | ICD-10-CM | POA: Diagnosis not present

## 2016-08-09 DIAGNOSIS — C50919 Malignant neoplasm of unspecified site of unspecified female breast: Secondary | ICD-10-CM | POA: Insufficient documentation

## 2016-08-09 DIAGNOSIS — Z95828 Presence of other vascular implants and grafts: Secondary | ICD-10-CM

## 2016-08-09 DIAGNOSIS — C50111 Malignant neoplasm of central portion of right female breast: Secondary | ICD-10-CM

## 2016-08-09 DIAGNOSIS — C787 Secondary malignant neoplasm of liver and intrahepatic bile duct: Secondary | ICD-10-CM

## 2016-08-09 DIAGNOSIS — C50112 Malignant neoplasm of central portion of left female breast: Secondary | ICD-10-CM

## 2016-08-09 LAB — CBC WITH DIFFERENTIAL/PLATELET
BASOS PCT: 0 %
Basophils Absolute: 0 10*3/uL (ref 0.0–0.1)
Eosinophils Absolute: 0.6 10*3/uL (ref 0.0–0.7)
Eosinophils Relative: 5 %
HEMATOCRIT: 34.8 % — AB (ref 36.0–46.0)
HEMOGLOBIN: 10.6 g/dL — AB (ref 12.0–15.0)
LYMPHS ABS: 1.2 10*3/uL (ref 0.7–4.0)
Lymphocytes Relative: 10 %
MCH: 28.6 pg (ref 26.0–34.0)
MCHC: 30.5 g/dL (ref 30.0–36.0)
MCV: 94.1 fL (ref 78.0–100.0)
MONO ABS: 1.1 10*3/uL — AB (ref 0.1–1.0)
MONOS PCT: 10 %
NEUTROS ABS: 8.2 10*3/uL — AB (ref 1.7–7.7)
Neutrophils Relative %: 75 %
Platelets: 298 10*3/uL (ref 150–400)
RBC: 3.7 MIL/uL — ABNORMAL LOW (ref 3.87–5.11)
RDW: 17.6 % — AB (ref 11.5–15.5)
WBC: 11 10*3/uL — ABNORMAL HIGH (ref 4.0–10.5)

## 2016-08-09 LAB — COMPREHENSIVE METABOLIC PANEL
ALBUMIN: 3.4 g/dL — AB (ref 3.5–5.0)
ALK PHOS: 73 U/L (ref 38–126)
ALT: 14 U/L (ref 14–54)
ANION GAP: 10 (ref 5–15)
AST: 21 U/L (ref 15–41)
BILIRUBIN TOTAL: 0.4 mg/dL (ref 0.3–1.2)
BUN: 12 mg/dL (ref 6–20)
CO2: 33 mmol/L — ABNORMAL HIGH (ref 22–32)
Calcium: 8.6 mg/dL — ABNORMAL LOW (ref 8.9–10.3)
Chloride: 93 mmol/L — ABNORMAL LOW (ref 101–111)
Creatinine, Ser: 0.48 mg/dL (ref 0.44–1.00)
Glucose, Bld: 115 mg/dL — ABNORMAL HIGH (ref 65–99)
POTASSIUM: 3.7 mmol/L (ref 3.5–5.1)
Sodium: 136 mmol/L (ref 135–145)
TOTAL PROTEIN: 6.5 g/dL (ref 6.5–8.1)

## 2016-08-09 MED ORDER — HEPARIN SOD (PORK) LOCK FLUSH 100 UNIT/ML IV SOLN: 500.0000 [IU] | Freq: Once | INTRAVENOUS | Status: AC

## 2016-08-09 MED ORDER — HEPARIN SOD (PORK) LOCK FLUSH 100 UNIT/ML IV SOLN
500.0000 [IU] | Freq: Once | INTRAVENOUS | Status: AC
  Administered 2016-08-09: 500 [IU] via INTRAVENOUS

## 2016-08-09 MED ORDER — SODIUM CHLORIDE 0.9% FLUSH
10.0000 mL | INTRAVENOUS | Status: AC | PRN
Start: 2016-08-09 — End: ?

## 2016-08-09 MED ORDER — SODIUM CHLORIDE 0.9% FLUSH
10.0000 mL | INTRAVENOUS | Status: DC | PRN
  Administered 2016-08-09: 10 mL via INTRAVENOUS
  Filled 2016-08-09: qty 10

## 2016-08-09 MED ORDER — HEPARIN SOD (PORK) LOCK FLUSH 100 UNIT/ML IV SOLN
INTRAVENOUS | Status: AC
  Filled 2016-08-09: qty 5

## 2016-08-09 NOTE — Patient Instructions (Signed)
[facility] at [facility] Discharge Instructions  RECOMMENDATIONS MADE BY THE CONSULTANT AND ANY TEST RESULTS WILL BE SENT TO YOUR REFERRING PHYSICIAN.  Portacath flushed today per protocol. Follow-up as scheduled. Call clinic for any questions or concerns  Thank you for choosing [facility] at [facility] to provide your oncology and hematology care.  To afford each patient quality time with our provider, please arrive at least 15 minutes before your scheduled appointment time.    If you have a lab appointment with the Cancer Center please come in thru the  Main Entrance and check in at the main information desk  You need to re-schedule your appointment should you arrive 10 or more minutes late.  We strive to give you quality time with our providers, and arriving late affects you and other patients whose appointments are after yours.  Also, if you no show three or more times for appointments you may be dismissed from the clinic at the providers discretion.     Again, thank you for choosing [facility].  Our hope is that these requests will decrease the amount of time that you wait before being seen by our physicians.       _____________________________________________________________  Should you have questions after your visit to [facility], please contact our office at [phone] between the hours of 8:30 a.m. and 4:30 p.m.  Voicemails left after 4:30 p.m. will not be returned until the following business day.  For prescription refill requests, have your pharmacy contact our office.       Resources For Cancer Patients and their Caregivers ? American Cancer Society: Can assist with transportation, wigs, general needs, runs Look Good Feel Better.        1-888-227-6333 ? Cancer Care: Provides financial assistance, online support groups, medication/co-pay assistance.  1-800-813-HOPE (4673) ? [facility] Assists [place] cancer patients and their families through emotional , educational and financial support.  336-427-4357 ? [place] DSS Where to apply for food stamps, Medicaid and utility assistance. 336-342-1394 ? [facility]: Transportation to medical appointments. 336-347-2287 ? Social Security Administration: May apply for disability if have a Stage IV cancer. 336-342-7796 1-800-772-1213 ? [place] Aging, Disability and Transit Services: Assists with nutrition, care and transit needs. 336-349-2343  Cancer Center Support Programs: [email or web address]@ > Cancer Support Group  2nd Tuesday of the month 1pm-2pm, Journey Room  > Creative Journey  3rd Tuesday of the month 1130am-1pm, Journey Room  > Look Good Feel Better  1st Wednesday of the month 10am-12 noon, Journey Room (Call American Cancer Society to register 1-800-395-5775)   

## 2016-08-09 NOTE — Progress Notes (Unsigned)
Sheila Garrison tolerated portacath flush well without complaints or incident. Port accessed with 20 gauge needle with blood return noted then flushed with 10 ml NS and 5 ml Heparin easily per protocol then de-accessed. Pt discharged self ambulatory in satisfactory condition accompanied by a friend

## 2016-08-09 NOTE — Progress Notes (Signed)
Sheila Garrison, [place] [address]  Progress Note  Breast Cancer  CURRENT THERAPY: Docetaxel/Herceptin/Perjeta beginning on 03/06/2016.  Docetaxel dose reduced by 20% beginning on 03/27/2016.  INTERVAL HISTORY: Sheila Garrison 61 y.o. 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 AND Invasive lobular left breast cancer, Stage IIB (pT3pN2A), S/P left mastectomy by Dr. Arnoldo Morale on 12/16/2015, ER/PR+ 2%.  HER2 NEGATIVE.  Done prior to knowing about Stage IV disease. 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 Margot Chimes, systemic chemotherapy by Dr. Jana Hakim in 2012 and Herceptin x 52 weeks finishing on 07/10/2011, XRT (01/04/2011- 02/22/2011), and anti-endocrine therapy.    Stage III (T4N3) R Br cancer    06/21/2010 Initial Diagnosis    Stage III (T4N3) R Br cancer       01/04/2011 - 02/22/2011 Radiation Therapy    50 Gy       05/07/2014 Pathology Results    BCI testing- low risk of late recurrence (3.4% between years 5-10), high likelihood of benefit from extended endocrein therapy, and a 67% relative risk reduction when treated with extended endocrine therapy (27% versus 10.5%).       [name], stage 4, left ([place])   11/22/2015 Mammogram    Ultrasound-guided biopsy of the irregular hypoechoic area, with prominent shadowing, in the OUTER left breast at the 1 o'clock axis. 2. Ultrasound-guided biopsy of the irregular mass within the INNER left breast at the 9 o'clock axis. 3. Ultrasound-guided biopsy of 1 of the enlarged and amorphous lymph nodes in the left axilla.      11/22/2015 Imaging    Ultrasound-guided biopsy of the irregular hypoechoic area, with prominent shadowing, in the OUTER left breast at the 1 o'clock axis. 2. Ultrasound-guided biopsy of  the irregular mass within the INNER left breast at the 9 o'clock axis. 3. Ultrasound-guided biopsy of 1 of the enlarged and amorphous lymph nodes in the left axilla.      12/16/2015 Surgery    L modified radical mastectomy with Dr. Arnoldo Morale      12/18/2015 - 12/21/2015 Hospital Admission    Admit date: 12/18/2015 Admission diagnosis: altered MS, acute respiratory failure Additional comments: This likely was a combination of COPD exacerbation along with mild acute on chronic diastolic CHF with  echo shows a preserved EF of around 60%, he was treated with IV steroids and Lasix, she is much better but qualifies for home oxygen which will be provided      12/19/2015 Imaging    No evidence of pulmonary embolus. 2. Small right pleural effusion, with associated atelectasis. Peripheral scarring at the anterior right upper lobe. Mild bilateral emphysema noted. 3. Scattered coronary artery calcifications seen. 4. Postoperative change at the left chest wall, reflecting recent mastectomy, with vague collections of postoperative fluid seen. 5. Vague sclerotic change and heterogeneity at vertebral body T8 raises concern for sequelae of metastatic disease      01/23/2016 PET scan    1. Widespread multifocal metastatic disease in the visualized axial and appendicular skeleton. 2. Solitary metastatic lesion in the liver. Hypermetabolic irregular left axillary lymph node adjacent to the left axillary clips. 3. Activity in the left breast related to mastectomy. 4. Low-grade activity along the pleuroparenchymal thickening anteriorly in the right chest is probably related to prior therapy rather than current  malignancy. 5. Probable mild compression fracture at T8 associated with the extensive tumor at this level. There is also bony destruction of the right ninth rib posteriorly. 6. Faintly hypermetabolic but small left external iliac lymph nodes merit surveillance  ADDENDUM: The original report  was by Dr. Van Clines. The following addendum is by Dr. Van Clines:  I have one other musculoskeletal focus of hypermetabolic activity to mention. In the right latissimus dorsi muscle, there is a small hypermetabolic focus with maximum SUV of 5.4, measuring 7 mm in diameter on image 89/4, compatible with a metastatic deposit.        02/02/2016 Imaging    MRI T-spine: 1. Extensive metastatic disease throughout the cervical and thoracic spine with a subtle pathologic compression fracture of T8. Tumor extends through the posterior margin of the T8 vertebra into the spinal canal without spinal cord compression at this time. 2. No other pathologic fractures of the thoracic spine at this time.      02/10/2016 Procedure    US biopsy of liver lesion      02/15/2016 Pathology Results    Liver, needle/core biopsy, right lobe METASTATIC CARCINOMA, CONSISTENT WITH BREAST DUCTAL CARCINOMA.      02/15/2016 - 02/28/2016 Chemotherapy    Letrozole 2.5 mg daily + Kisqali (600 mg) days 1-21 every 28 days       02/21/2016 Pathology Results    Breast Prognostic profile from liver biopsy: HER2 POSITIVE, ER/PR NEGATIVE (0%).      02/27/2016 - 03/09/2016 Radiation Therapy    Palliative XRT to T7-T11 with Dr. Lianne Cure; 30 Gy in 10 fractions.       02/28/2016 Treatment Plan Change    Due to HER2 positivity and ER/PR negativity from liver biopsy, change to systemic chemotherapy is recommended.      03/06/2016 -  Chemotherapy    Docetaxel/Herceptin/Perjeta with Neulasta support.      03/11/2016 - 03/15/2016 Hospital Admission    Admit date: 03/11/2016 Admission diagnosis: Febrile neutropenia Additional comments: COPD exacerbation      03/27/2016 Treatment Plan Change    Docetaxel dose reduced by 20%.      03/27/2016 Adverse Reaction    Demonstrating early signs of palmar-plantar erythrodysesthesia.      03/27/2016 Treatment Plan Change    Defer treatment 1 week.      04/10/2016 Imaging     MUGA- The left ventricular ejection fraction is equal to 56.2%. Normal left ventricular wall motion.      06/04/2016 PET scan    1. No residual hypermetabolism within previously hypermetabolic lesions in the left axilla, right latissimus dorsi muscle, anterior right pleural space, liver, left external iliac lymph node and bones. 2. Aortic atherosclerosis (ICD10-170.0). Coronary artery calcification.      She completed cycle 6 of [place] on 06/26/16.  Hydeia Mcatee presents to the clinic for continuing follow up with her friend. Patient started taking nerlynx on 07/25/16. She has been tolerating it well and has been taking imodium along with it as prescribed. She has had no diarrhea except for one episode when she only took her imodium twice a day due to constipation symptoms. Otherwise she states her energy level is improving. She denies any nausea or vomiting. Appetite is good.   Review of Systems  Constitutional: Negative for chills, fever, malaise/fatigue and weight loss.  HENT: Negative for hearing loss, sore throat and tinnitus.   Eyes: Negative.  Negative for blurred vision, photophobia and discharge.  Respiratory: Negative for cough, hemoptysis, shortness of  breath and wheezing.   Cardiovascular: Negative.  Negative for chest pain, palpitations, orthopnea, claudication and leg swelling.  Gastrointestinal: Negative for abdominal pain, constipation, diarrhea (chemo-induced; resolves on its own.), melena, nausea and vomiting.  Genitourinary: Negative.  Negative for dysuria and hematuria.  Musculoskeletal: Negative for back pain, joint pain, myalgias and neck pain.  Skin: Negative.  Negative for itching and rash.  Neurological: Positive for tingling (chemo-induced neuropathy). Negative for dizziness, weakness and headaches.  Endo/Heme/Allergies: Negative.  Negative for environmental allergies and polydipsia. Does not bruise/bleed easily.  Psychiatric/Behavioral: Negative.  Negative for  depression. The patient is not nervous/anxious and does not have insomnia.     Past Medical History:  Diagnosis Date  . Anxiety   . Bone metastases ([place]) 02/15/2016  . Breast CA ([place]) 07/01/2010   rt breast ca  . COPD (chronic obstructive pulmonary disease) ([place])   . Family history of breast cancer    Aunt on father's side  . Hyperlipemia   . Hypertension   . MRSA (methicillin resistant staph aureus) culture positive    in breast  . Neuropathy    bilateral toes  . Nipple discharge    with pain, infection, lump  . S/P radiation therapy 01/08/11 - 02/22/11   Right Chest Wall/ 50 Gy / 25 Fractions, Right [place]/ 50 gy/25 Fractions, Right Axillary Boost/500 cGy/25 Fractions, Right Chest Wall Boost/10 Gy/5 Fractions  . Stage III (T4N3) R Br cancer  06/21/2010   Letrozole 2.5 mg which she started on 03/09/2011.  This patient presented to my office on Jun 21, 2010 with a large ulcerated right breast mass and bleeding. She underwent additional workup at that time and a biopsy was done showing invasive breast cancer. She was staged and was found to have a T4  disease that was ER, PR, HER-2 positive. CA 2729 was normal at 31. She is undergoing chemotherapy wi  . Status post chemotherapy    6 cycles of carboplatin and docetaxel with trastuzumab every 21 days followed by surgery  . Tobacco abuse 06/22/2015  . Use of letrozole ([place]) 03/09/2011  . Wears dentures     Past Surgical History:  Procedure Laterality Date  . BREAST SURGERY  2012  . DILATION AND CURETTAGE OF UTERUS    . MASTECTOMY MODIFIED RADICAL Left 12/16/2015   Procedure: MASTECTOMY MODIFIED RADICAL;  Surgeon: Aviva Signs, MD;  Location: [facility];  Service: General;  Laterality: Left;  . port a catheter insertion    . PORT-A-CATH REMOVAL Left 01/15/2013   Procedure: REMOVAL PORT-A-CATH;  Surgeon: Scherry Ran, MD;  Location: [facility];  Service: General;  Laterality: Left;  . PORTACATH PLACEMENT Right 01/16/2016   Procedure:  INSERTION PORT-A-CATH;  Surgeon: Aviva Signs, MD;  Location: [facility];  Service: General;  Laterality: Right;  . TUBAL LIGATION      Family History  Problem Relation Age of Onset  . COPD Mother   . Diabetes Father   . Hypertension Father   . COPD Brother   . Cancer Maternal Aunt        ovarian - died of old age  . Cancer Paternal Aunt        had breast ca; died of of a different cancer  . Lung cancer Brother     Social History   Social History  . Marital status: Married    Spouse name: N/A  . Number of children: N/A  . Years of education: N/A   Social History Main Topics  .  Smoking status: Former Smoker    Packs/day: 0.25    Years: 30.00    Types: Cigarettes    Quit date: 12/15/2015  . Smokeless tobacco: Never Used  . Alcohol use No  . Drug use: No  . Sexual activity: Yes    Birth control/ protection: Post-menopausal   Other Topics Concern  . None   Social History Narrative  . None     PHYSICAL EXAMINATION  ECOG PERFORMANCE STATUS: 1 - Symptomatic but completely ambulatory   Physical Exam  Constitutional: She is oriented to person, place, and time and well-developed, well-nourished, and in no distress. No distress.  HENT:  Head: Normocephalic and atraumatic.  Mouth/Throat: No oropharyngeal exudate.  Eyes: Conjunctivae and EOM are normal. Pupils are equal, round, and reactive to light. No scleral icterus.  Neck: Normal range of motion. Neck supple. No JVD present.  Cardiovascular: Normal rate, regular rhythm and normal heart sounds.  Exam reveals no gallop and no friction rub.   No murmur heard. Pulmonary/Chest: Effort normal and breath sounds normal. No respiratory distress. She has no wheezes. She has no rales.  Abdominal: Soft. Bowel sounds are normal. She exhibits no distension. There is no tenderness. There is no guarding.  Musculoskeletal: Normal range of motion. She exhibits no edema or tenderness.  Lymphadenopathy:    She has no cervical adenopathy.    Neurological: She is alert and oriented to person, place, and time. No cranial nerve deficit. Gait normal.  Skin: Skin is warm and dry. No rash noted. No erythema. No pallor.  Psychiatric: Affect and judgment normal.  Nursing note and vitals reviewed.    LABORATORY DATA: CBC    Component Value Date/Time   WBC 11.0 (H) 08/09/2016 1452   RBC 3.70 (L) 08/09/2016 1452   HGB 10.6 (L) 08/09/2016 1452   HGB 13.5 12/27/2015 1553   HGB 10.1 (L) 11/07/2010 0917   HCT 34.8 (L) 08/09/2016 1452   HCT 41.3 12/27/2015 1553   HCT 31.1 (L) 11/07/2010 0917   PLT 298 08/09/2016 1452   PLT 406 (H) 12/27/2015 1553   MCV 94.1 08/09/2016 1452   MCV 92 12/27/2015 1553   MCV 100.3 11/07/2010 0917   MCH 28.6 08/09/2016 1452   MCHC 30.5 08/09/2016 1452   RDW 17.6 (H) 08/09/2016 1452   RDW 14.8 12/27/2015 1553   RDW 18.3 (H) 11/07/2010 0917   LYMPHSABS 1.2 08/09/2016 1452   LYMPHSABS 1.9 12/27/2015 1553   LYMPHSABS 1.6 11/07/2010 0917   MONOABS 1.1 (H) 08/09/2016 1452   MONOABS 1.4 (H) 11/07/2010 0917   EOSABS 0.6 08/09/2016 1452   EOSABS 0.2 12/27/2015 1553   BASOSABS 0.0 08/09/2016 1452   BASOSABS 0.0 12/27/2015 1553   BASOSABS 0.0 11/07/2010 0917      Chemistry      Component Value Date/Time   NA 136 08/09/2016 1452   NA 140 12/27/2015 1553   K 3.7 08/09/2016 1452   CL 93 (L) 08/09/2016 1452   CO2 33 (H) 08/09/2016 1452   BUN 12 08/09/2016 1452   BUN 22 12/27/2015 1553   CREATININE 0.48 08/09/2016 1452      Component Value Date/Time   CALCIUM 8.6 (L) 08/09/2016 1452   CALCIUM 7.2 (L) 05/15/2016 1032   ALKPHOS 73 08/09/2016 1452   AST 21 08/09/2016 1452   ALT 14 08/09/2016 1452   BILITOT 0.4 08/09/2016 1452   BILITOT 0.2 10/07/2015 0859        PENDING LABS:   RADIOGRAPHIC STUDIES: I have  personally reviewed the radiological images as listed and agreed with the findings in the report.  NUCLEAR MEDICINE PET SKULL BASE TO THIGH 06/04/2016  IMPRESSION: 1. No residual  hypermetabolism within previously hypermetabolic lesions in the left axilla, right latissimus dorsi muscle, anterior right pleural space, liver, left external iliac lymph node and bones. 2. Aortic atherosclerosis (ICD10-170.0). Coronary artery calcification.    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  Complicated by a LEFT invasive lobular carcinoma, Stage IIB (pT3pN2A), S/P left mastectomy by Dr. Arnoldo Morale on 12/16/2015, ER/PR+ 2%.  HER2 NEGATIVE.  Done prior to knowing about Stage IV disease. AND History with right invasive ductal breast cancer, Stage IIIC, ER/PR+ and HER 2 POSITIVE in 2012 managed with right mastectomy, systemic chemotherapy, HER2 targeted therapy, XRT, and anti-endocrine therapy   She started palliative XRT to T8 pathologic fracture on 02/27/2016.  Chemo with docetaxel/herceptin/perjeta for 6 cycles: 03/06/16-06/26/16.  PLAN: Continue maintenance neratinib (nerlynx) 240 mg by mouth daily. She is due for a restaging PET scan which I have ordered to be done 1-2 days prior to her next visit.  RTC in 4 weeks for follow up with labs.  Orders Placed This Encounter  Procedures  . NM PET Image Restag (PS) Skull Base To Thigh    Standing Status:   Future    Standing Expiration Date:   08/09/2017    Order Specific Question:   Reason for Exam (SYMPTOM  OR DIAGNOSIS REQUIRED)    Answer:   restaging pet to assess response to new treatment    Order Specific Question:   If indicated for the ordered procedure, I authorize the administration of a radiopharmaceutical per Radiology protocol    Answer:   Yes    Order Specific Question:   Preferred imaging location?    Answer:   [facility]    Order Specific Question:   Radiology Contrast Protocol - do NOT remove file path    Answer:    [email or web address]  . VITAMIN D 25 Hydroxy (Vit-D Deficiency, Fractures)    Standing Status:   Standing    Number of Occurrences:   1  . CBC with Differential    Standing Status:   Future    Standing Expiration Date:   08/09/2017  . Comprehensive metabolic panel    Standing Status:   Future    Standing Expiration Date:   08/09/2017  . Cancer antigen 27.29    Standing Status:   Future    Standing Expiration Date:   08/09/2017  . Cancer antigen 15-3    Standing Status:   Future    Standing Expiration Date:   08/09/2017    THERAPY PLAN:  oral maintenance therapy with neratinib (nerlynx).    All questions were answered. The patient knows to call the clinic with any problems, questions or concerns. We can certainly see the patient much sooner if necessary.  This note is electronically signed by: Twana First, MD 08/09/2016 3:40 PM

## 2016-08-10 LAB — CANCER ANTIGEN 27.29: CAN 27.29: 24.3 U/mL (ref 0.0–38.6)

## 2016-08-10 LAB — CANCER ANTIGEN 15-3: CA 15-3: 23.5 U/mL (ref 0.0–25.0)

## 2016-09-03 ENCOUNTER — Other Ambulatory Visit (HOSPITAL_COMMUNITY): Payer: Self-pay | Admitting: Oncology

## 2016-09-03 ENCOUNTER — Ambulatory Visit (HOSPITAL_COMMUNITY)
Admission: RE | Admit: 2016-09-03 | Discharge: 2016-09-03 | Disposition: A | Discharge status: Home / Self Care | Payer: PRIVATE HEALTH INSURANCE | Source: Ambulatory Visit | Attending: Oncology | Admitting: Oncology

## 2016-09-03 DIAGNOSIS — J439 Emphysema, unspecified: Secondary | ICD-10-CM | POA: Insufficient documentation

## 2016-09-03 DIAGNOSIS — J90 Pleural effusion, not elsewhere classified: Secondary | ICD-10-CM | POA: Diagnosis not present

## 2016-09-03 DIAGNOSIS — C50112 Malignant neoplasm of central portion of left female breast: Secondary | ICD-10-CM | POA: Diagnosis not present

## 2016-09-03 DIAGNOSIS — I700 Atherosclerosis of aorta: Secondary | ICD-10-CM | POA: Insufficient documentation

## 2016-09-03 DIAGNOSIS — Z170 Estrogen receptor positive status [ER+]: Secondary | ICD-10-CM | POA: Insufficient documentation

## 2016-09-03 DIAGNOSIS — C50111 Malignant neoplasm of central portion of right female breast: Secondary | ICD-10-CM | POA: Diagnosis not present

## 2016-09-03 DIAGNOSIS — F411 Generalized anxiety disorder: Secondary | ICD-10-CM

## 2016-09-03 DIAGNOSIS — R918 Other nonspecific abnormal finding of lung field: Secondary | ICD-10-CM | POA: Diagnosis not present

## 2016-09-03 LAB — GLUCOSE, CAPILLARY: Glucose-Capillary: 96 mg/dL (ref 65–99)

## 2016-09-03 MED ORDER — FLUDEOXYGLUCOSE F - 18 (FDG) INJECTION
7.3300 | Freq: Once | INTRAVENOUS | Status: AC | PRN
  Administered 2016-09-03: 7.33 via INTRAVENOUS

## 2016-09-06 ENCOUNTER — Encounter (HOSPITAL_BASED_OUTPATIENT_CLINIC_OR_DEPARTMENT_OTHER): Payer: PRIVATE HEALTH INSURANCE | Admitting: Oncology

## 2016-09-06 ENCOUNTER — Encounter (HOSPITAL_COMMUNITY): Payer: Self-pay | Admitting: Emergency Medicine

## 2016-09-06 ENCOUNTER — Encounter (HOSPITAL_COMMUNITY): Payer: Self-pay

## 2016-09-06 ENCOUNTER — Encounter (HOSPITAL_COMMUNITY): Payer: PRIVATE HEALTH INSURANCE

## 2016-09-06 ENCOUNTER — Encounter (HOSPITAL_COMMUNITY): Payer: PRIVATE HEALTH INSURANCE | Attending: Oncology

## 2016-09-06 ENCOUNTER — Telehealth (HOSPITAL_COMMUNITY): Payer: Self-pay | Admitting: Oncology

## 2016-09-06 VITALS — BP 146/73

## 2016-09-06 VITALS — BP 145/119 | HR 88 | Resp 16 | Ht 64.5 in | Wt 142.9 lb

## 2016-09-06 DIAGNOSIS — I10 Essential (primary) hypertension: Secondary | ICD-10-CM | POA: Diagnosis not present

## 2016-09-06 DIAGNOSIS — J449 Chronic obstructive pulmonary disease, unspecified: Secondary | ICD-10-CM | POA: Diagnosis not present

## 2016-09-06 DIAGNOSIS — Z87891 Personal history of nicotine dependence: Secondary | ICD-10-CM | POA: Diagnosis not present

## 2016-09-06 DIAGNOSIS — C50911 Malignant neoplasm of unspecified site of right female breast: Secondary | ICD-10-CM | POA: Insufficient documentation

## 2016-09-06 DIAGNOSIS — I2510 Atherosclerotic heart disease of native coronary artery without angina pectoris: Secondary | ICD-10-CM | POA: Insufficient documentation

## 2016-09-06 DIAGNOSIS — C787 Secondary malignant neoplasm of liver and intrahepatic bile duct: Secondary | ICD-10-CM | POA: Diagnosis not present

## 2016-09-06 DIAGNOSIS — M8448XA Pathological fracture, other site, initial encounter for fracture: Secondary | ICD-10-CM | POA: Diagnosis not present

## 2016-09-06 DIAGNOSIS — Z9013 Acquired absence of bilateral breasts and nipples: Secondary | ICD-10-CM | POA: Insufficient documentation

## 2016-09-06 DIAGNOSIS — C7951 Secondary malignant neoplasm of bone: Secondary | ICD-10-CM | POA: Insufficient documentation

## 2016-09-06 DIAGNOSIS — I700 Atherosclerosis of aorta: Secondary | ICD-10-CM | POA: Insufficient documentation

## 2016-09-06 DIAGNOSIS — C50112 Malignant neoplasm of central portion of left female breast: Secondary | ICD-10-CM

## 2016-09-06 DIAGNOSIS — Z170 Estrogen receptor positive status [ER+]: Secondary | ICD-10-CM

## 2016-09-06 DIAGNOSIS — F419 Anxiety disorder, unspecified: Secondary | ICD-10-CM | POA: Insufficient documentation

## 2016-09-06 DIAGNOSIS — E785 Hyperlipidemia, unspecified: Secondary | ICD-10-CM | POA: Diagnosis not present

## 2016-09-06 DIAGNOSIS — Z803 Family history of malignant neoplasm of breast: Secondary | ICD-10-CM | POA: Diagnosis not present

## 2016-09-06 DIAGNOSIS — C50912 Malignant neoplasm of unspecified site of left female breast: Secondary | ICD-10-CM | POA: Insufficient documentation

## 2016-09-06 DIAGNOSIS — G629 Polyneuropathy, unspecified: Secondary | ICD-10-CM | POA: Insufficient documentation

## 2016-09-06 DIAGNOSIS — C50111 Malignant neoplasm of central portion of right female breast: Secondary | ICD-10-CM

## 2016-09-06 LAB — COMPREHENSIVE METABOLIC PANEL
ALT: 12 U/L — AB (ref 14–54)
ANION GAP: 8 (ref 5–15)
AST: 19 U/L (ref 15–41)
Albumin: 3.3 g/dL — ABNORMAL LOW (ref 3.5–5.0)
Alkaline Phosphatase: 77 U/L (ref 38–126)
BUN: 12 mg/dL (ref 6–20)
CHLORIDE: 96 mmol/L — AB (ref 101–111)
CO2: 35 mmol/L — AB (ref 22–32)
Calcium: 9.1 mg/dL (ref 8.9–10.3)
Creatinine, Ser: 0.56 mg/dL (ref 0.44–1.00)
GFR calc non Af Amer: >60 mL/min (ref >60)
Glucose, Bld: 132 mg/dL — ABNORMAL HIGH (ref 65–99)
POTASSIUM: 3.6 mmol/L (ref 3.5–5.1)
SODIUM: 139 mmol/L (ref 135–145)
Total Bilirubin: 0.4 mg/dL (ref 0.3–1.2)
Total Protein: 7.1 g/dL (ref 6.5–8.1)

## 2016-09-06 LAB — CBC WITH DIFFERENTIAL/PLATELET
Basophils Absolute: 0 10*3/uL (ref 0.0–0.1)
Basophils Relative: 0 %
EOS ABS: 0.5 10*3/uL (ref 0.0–0.7)
EOS PCT: 5 %
HCT: 34.8 % — ABNORMAL LOW (ref 36.0–46.0)
Hemoglobin: 10.5 g/dL — ABNORMAL LOW (ref 12.0–15.0)
LYMPHS ABS: 1.1 10*3/uL (ref 0.7–4.0)
Lymphocytes Relative: 11 %
MCH: 27.3 pg (ref 26.0–34.0)
MCHC: 30.2 g/dL (ref 30.0–36.0)
MCV: 90.6 fL (ref 78.0–100.0)
Monocytes Absolute: 0.9 10*3/uL (ref 0.1–1.0)
Monocytes Relative: 9 %
Neutro Abs: 7.3 10*3/uL (ref 1.7–7.7)
Neutrophils Relative %: 75 %
PLATELETS: 326 10*3/uL (ref 150–400)
RBC: 3.84 MIL/uL — ABNORMAL LOW (ref 3.87–5.11)
RDW: 16 % — AB (ref 11.5–15.5)
WBC: 9.8 10*3/uL (ref 4.0–10.5)

## 2016-09-06 MED ORDER — DENOSUMAB 120 MG/1.7ML ~~LOC~~ SOLN
120.0000 mg | Freq: Once | SUBCUTANEOUS | Status: AC
  Administered 2016-09-06: 120 mg via SUBCUTANEOUS
  Filled 2016-09-06: qty 1.7

## 2016-09-06 NOTE — Patient Instructions (Signed)
[facility] at [facility] Discharge Instructions  RECOMMENDATIONS MADE BY THE CONSULTANT AND ANY TEST RESULTS WILL BE SENT TO YOUR REFERRING PHYSICIAN.  Received Xgeva injection today. Follow-up as scheduled. Call clinic for any questions or concerns  Thank you for choosing [facility] at [facility] to provide your oncology and hematology care.  To afford each patient quality time with our provider, please arrive at least 15 minutes before your scheduled appointment time.    If you have a lab appointment with the Cancer Center please come in thru the  Main Entrance and check in at the main information desk  You need to re-schedule your appointment should you arrive 10 or more minutes late.  We strive to give you quality time with our providers, and arriving late affects you and other patients whose appointments are after yours.  Also, if you no show three or more times for appointments you may be dismissed from the clinic at the providers discretion.     Again, thank you for choosing [facility].  Our hope is that these requests will decrease the amount of time that you wait before being seen by our physicians.       _____________________________________________________________  Should you have questions after your visit to [facility], please contact our office at [phone] between the hours of 8:30 a.m. and 4:30 p.m.  Voicemails left after 4:30 p.m. will not be returned until the following business day.  For prescription refill requests, have your pharmacy contact our office.       Resources For Cancer Patients and their Caregivers ? American Cancer Society: Can assist with transportation, wigs, general needs, runs Look Good Feel Better.        1-888-227-6333 ? Cancer Care: Provides financial assistance, online support groups, medication/co-pay assistance.  1-800-813-HOPE (4673) ? [facility] Assists [place] cancer patients and their families through emotional , educational and financial support.  336-427-4357 ? [place] DSS Where to apply for food stamps, Medicaid and utility assistance. 336-342-1394 ? [facility]: Transportation to medical appointments. 336-347-2287 ? Social Security Administration: May apply for disability if have a Stage IV cancer. 336-342-7796 1-800-772-1213 ? [place] Aging, Disability and Transit Services: Assists with nutrition, care and transit needs. 336-349-2343  Cancer Center Support Programs: [email or web address]@ > Cancer Support Group  2nd Tuesday of the month 1pm-2pm, Journey Room  > Creative Journey  3rd Tuesday of the month 1130am-1pm, Journey Room  > Look Good Feel Better  1st Wednesday of the month 10am-12 noon, Journey Room (Call American Cancer Society to register 1-800-395-5775)   

## 2016-09-06 NOTE — Progress Notes (Signed)
Chemotherapy teaching pulled together. 

## 2016-09-06 NOTE — Progress Notes (Signed)
Sheila Garrison, [place] [address]  Progress Note  Breast Cancer  CURRENT THERAPY: Docetaxel/Herceptin/Perjeta beginning on 03/06/2016.  Docetaxel dose reduced by 20% beginning on 03/27/2016.  INTERVAL HISTORY: [name] 61 y.o. 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 AND Invasive lobular left breast cancer, Stage IIB (pT3pN2A), S/P left mastectomy by Dr. Arnoldo Morale on 12/16/2015, ER/PR+ 2%.  HER2 NEGATIVE.  Done prior to knowing about Stage IV disease. 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 Margot Chimes, systemic chemotherapy by Dr. Jana Hakim in 2012 and Herceptin x 52 weeks finishing on 07/10/2011, XRT (01/04/2011- 02/22/2011), and anti-endocrine therapy.    Stage III (T4N3) R Br cancer    06/21/2010 Initial Diagnosis    Stage III (T4N3) R Br cancer       01/04/2011 - 02/22/2011 Radiation Therapy    50 Gy       05/07/2014 Pathology Results    BCI testing- low risk of late recurrence (3.4% between years 5-10), high likelihood of benefit from extended endocrein therapy, and a 67% relative risk reduction when treated with extended endocrine therapy (27% versus 10.5%).       [name], stage 4, left ([place])   11/22/2015 Mammogram    Ultrasound-guided biopsy of the irregular hypoechoic area, with prominent shadowing, in the OUTER left breast at the 1 o'clock axis. 2. Ultrasound-guided biopsy of the irregular mass within the INNER left breast at the 9 o'clock axis. 3. Ultrasound-guided biopsy of 1 of the enlarged and amorphous lymph nodes in the left axilla.      11/22/2015 Imaging    Ultrasound-guided biopsy of the irregular hypoechoic area, with prominent shadowing, in the OUTER left breast at the 1 o'clock axis. 2. Ultrasound-guided biopsy of  the irregular mass within the INNER left breast at the 9 o'clock axis. 3. Ultrasound-guided biopsy of 1 of the enlarged and amorphous lymph nodes in the left axilla.      12/16/2015 Surgery    L modified radical mastectomy with Dr. Arnoldo Morale      12/18/2015 - 12/21/2015 Hospital Admission    Admit date: 12/18/2015 Admission diagnosis: altered MS, acute respiratory failure Additional comments: This likely was a combination of COPD exacerbation along with mild acute on chronic diastolic CHF with  echo shows a preserved EF of around 60%, he was treated with IV steroids and Lasix, she is much better but qualifies for home oxygen which will be provided      12/19/2015 Imaging    No evidence of pulmonary embolus. 2. Small right pleural effusion, with associated atelectasis. Peripheral scarring at the anterior right upper lobe. Mild bilateral emphysema noted. 3. Scattered coronary artery calcifications seen. 4. Postoperative change at the left chest wall, reflecting recent mastectomy, with vague collections of postoperative fluid seen. 5. Vague sclerotic change and heterogeneity at vertebral body T8 raises concern for sequelae of metastatic disease      01/23/2016 PET scan    1. Widespread multifocal metastatic disease in the visualized axial and appendicular skeleton. 2. Solitary metastatic lesion in the liver. Hypermetabolic irregular left axillary lymph node adjacent to the left axillary clips. 3. Activity in the left breast related to mastectomy. 4. Low-grade activity along the pleuroparenchymal thickening anteriorly in the right chest is probably related to prior therapy rather than current  malignancy. 5. Probable mild compression fracture at T8 associated with the extensive tumor at this level. There is also bony destruction of the right ninth rib posteriorly. 6. Faintly hypermetabolic but small left external iliac lymph nodes merit surveillance  ADDENDUM: The original report  was by Dr. Van Clines. The following addendum is by Dr. Van Clines:  I have one other musculoskeletal focus of hypermetabolic activity to mention. In the right latissimus dorsi muscle, there is a small hypermetabolic focus with maximum SUV of 5.4, measuring 7 mm in diameter on image 89/4, compatible with a metastatic deposit.        02/02/2016 Imaging    MRI T-spine: 1. Extensive metastatic disease throughout the cervical and thoracic spine with a subtle pathologic compression fracture of T8. Tumor extends through the posterior margin of the T8 vertebra into the spinal canal without spinal cord compression at this time. 2. No other pathologic fractures of the thoracic spine at this time.      02/10/2016 Procedure    US biopsy of liver lesion      02/15/2016 Pathology Results    Liver, needle/core biopsy, right lobe METASTATIC CARCINOMA, CONSISTENT WITH BREAST DUCTAL CARCINOMA.      02/15/2016 - 02/28/2016 Chemotherapy    Letrozole 2.5 mg daily + Kisqali (600 mg) days 1-21 every 28 days       02/21/2016 Pathology Results    Breast Prognostic profile from liver biopsy: HER2 POSITIVE, ER/PR NEGATIVE (0%).      02/27/2016 - 03/09/2016 Radiation Therapy    Palliative XRT to T7-T11 with Dr. Lianne Cure; 30 Gy in 10 fractions.       02/28/2016 Treatment Plan Change    Due to HER2 positivity and ER/PR negativity from liver biopsy, change to systemic chemotherapy is recommended.      03/06/2016 -  Chemotherapy    Docetaxel/Herceptin/Perjeta with Neulasta support.      03/11/2016 - 03/15/2016 Hospital Admission    Admit date: 03/11/2016 Admission diagnosis: Febrile neutropenia Additional comments: COPD exacerbation      03/27/2016 Treatment Plan Change    Docetaxel dose reduced by 20%.      03/27/2016 Adverse Reaction    Demonstrating early signs of palmar-plantar erythrodysesthesia.      03/27/2016 Treatment Plan Change    Defer treatment 1 week.      04/10/2016 Imaging     MUGA- The left ventricular ejection fraction is equal to 56.2%. Normal left ventricular wall motion.      06/04/2016 PET scan    1. No residual hypermetabolism within previously hypermetabolic lesions in the left axilla, right latissimus dorsi muscle, anterior right pleural space, liver, left external iliac lymph node and bones. 2. Aortic atherosclerosis (ICD10-170.0). Coronary artery calcification.      07/25/2016 Treatment Plan Change    Started Nerlynx      09/03/2016 PET scan     Interval development of small to moderate right pleural effusion associated with a linear band of hypermetabolism in the posterior right costophrenic sulcus ( SUV max = 5.7) . Imaging features are concerning for recurrent posterior right pleural disease.       Sheila Garrison presents to the clinic for continuing follow up with her friend. Patient has been tolerating nerlynx  well and has been taking imodium along with it as prescribed and has not had any severe diarrhea. She denies any nausea or vomiting. Appetite is good.  She denies any recent infections, fevers/chills, or worsening of her baseline SOB.   Review of  Systems  Constitutional: Negative for chills, fever, malaise/fatigue and weight loss.  HENT: Negative for hearing loss, sore throat and tinnitus.   Eyes: Negative.  Negative for blurred vision, photophobia and discharge.  Respiratory: Negative for cough, hemoptysis, shortness of breath and wheezing.   Cardiovascular: Negative.  Negative for chest pain, palpitations, orthopnea, claudication and leg swelling.  Gastrointestinal: Negative for abdominal pain, constipation, diarrhea, melena, nausea and vomiting.  Genitourinary: Negative.  Negative for dysuria and hematuria.  Musculoskeletal: Negative for back pain, joint pain, myalgias and neck pain.  Skin: Negative.  Negative for itching and rash.  Neurological: Negative for dizziness, tingling, weakness and headaches.  Endo/Heme/Allergies:  Negative.  Negative for environmental allergies and polydipsia. Does not bruise/bleed easily.  Psychiatric/Behavioral: Negative.  Negative for depression. The patient is not nervous/anxious and does not have insomnia.     Past Medical History:  Diagnosis Date  . Anxiety   . Bone metastases ([place]) 02/15/2016  . Breast CA ([place]) 07/01/2010   rt breast ca  . COPD (chronic obstructive pulmonary disease) ([place])   . Family history of breast cancer    Aunt on father's side  . Hyperlipemia   . Hypertension   . MRSA (methicillin resistant staph aureus) culture positive    in breast  . Neuropathy    bilateral toes  . Nipple discharge    with pain, infection, lump  . S/P radiation therapy 01/08/11 - 02/22/11   Right Chest Wall/ 50 Gy / 25 Fractions, Right [place]/ 50 gy/25 Fractions, Right Axillary Boost/500 cGy/25 Fractions, Right Chest Wall Boost/10 Gy/5 Fractions  . Stage III (T4N3) R Br cancer  06/21/2010   Letrozole 2.5 mg which she started on 03/09/2011.  This patient presented to my office on Jun 21, 2010 with a large ulcerated right breast mass and bleeding. She underwent additional workup at that time and a biopsy was done showing invasive breast cancer. She was staged and was found to have a T4  disease that was ER, PR, HER-2 positive. CA 2729 was normal at 31. She is undergoing chemotherapy wi  . Status post chemotherapy    6 cycles of carboplatin and docetaxel with trastuzumab every 21 days followed by surgery  . Tobacco abuse 06/22/2015  . Use of letrozole ([place]) 03/09/2011  . Wears dentures     Past Surgical History:  Procedure Laterality Date  . BREAST SURGERY  2012  . DILATION AND CURETTAGE OF UTERUS    . MASTECTOMY MODIFIED RADICAL Left 12/16/2015   Procedure: MASTECTOMY MODIFIED RADICAL;  Surgeon: Aviva Signs, MD;  Location: [facility];  Service: General;  Laterality: Left;  . port a catheter insertion    . PORT-A-CATH REMOVAL Left 01/15/2013   Procedure: REMOVAL PORT-A-CATH;   Surgeon: Scherry Ran, MD;  Location: [facility];  Service: General;  Laterality: Left;  . PORTACATH PLACEMENT Right 01/16/2016   Procedure: INSERTION PORT-A-CATH;  Surgeon: Aviva Signs, MD;  Location: [facility];  Service: General;  Laterality: Right;  . TUBAL LIGATION      Family History  Problem Relation Age of Onset  . COPD Mother   . Diabetes Father   . Hypertension Father   . COPD Brother   . Cancer Maternal Aunt        ovarian - died of old age  . Cancer Paternal Aunt        had breast ca; died of of a different cancer  . Lung cancer Brother     Social History  Social History  . Marital status: Married    Spouse name: N/A  . Number of children: N/A  . Years of education: N/A   Social History Main Topics  . Smoking status: Former Smoker    Packs/day: 0.25    Years: 30.00    Types: Cigarettes    Quit date: 12/15/2015  . Smokeless tobacco: Never Used  . Alcohol use No  . Drug use: No  . Sexual activity: Yes    Birth control/ protection: Post-menopausal   Other Topics Concern  . None   Social History Narrative  . None     PHYSICAL EXAMINATION  ECOG PERFORMANCE STATUS: 1 - Symptomatic but completely ambulatory   Physical Exam  Constitutional: She is oriented to person, place, and time and well-developed, well-nourished, and in no distress. No distress.  HENT:  Head: Normocephalic and atraumatic.  Mouth/Throat: No oropharyngeal exudate.  Eyes: Pupils are equal, round, and reactive to light. Conjunctivae and EOM are normal. No scleral icterus.  Neck: Normal range of motion. Neck supple. No JVD present.  Cardiovascular: Normal rate, regular rhythm and normal heart sounds.  Exam reveals no gallop and no friction rub.   No murmur heard. Pulmonary/Chest: Effort normal and breath sounds normal. No respiratory distress. She has no wheezes. She has no rales.  Mild decrease in breath sounds in right lung base  Abdominal: Soft. Bowel sounds are normal. She exhibits  no distension. There is no tenderness. There is no guarding.  Musculoskeletal: Normal range of motion. She exhibits no edema or tenderness.  Lymphadenopathy:    She has no cervical adenopathy.  Neurological: She is alert and oriented to person, place, and time. No cranial nerve deficit. Gait normal.  Skin: Skin is warm and dry. No rash noted. No erythema. No pallor.  Psychiatric: Affect and judgment normal.  Nursing note and vitals reviewed.    LABORATORY DATA: CBC    Component Value Date/Time   WBC 9.8 09/06/2016 1412   RBC 3.84 (L) 09/06/2016 1412   HGB 10.5 (L) 09/06/2016 1412   HGB 13.5 12/27/2015 1553   HGB 10.1 (L) 11/07/2010 0917   HCT 34.8 (L) 09/06/2016 1412   HCT 41.3 12/27/2015 1553   HCT 31.1 (L) 11/07/2010 0917   PLT 326 09/06/2016 1412   PLT 406 (H) 12/27/2015 1553   MCV 90.6 09/06/2016 1412   MCV 92 12/27/2015 1553   MCV 100.3 11/07/2010 0917   MCH 27.3 09/06/2016 1412   MCHC 30.2 09/06/2016 1412   RDW 16.0 (H) 09/06/2016 1412   RDW 14.8 12/27/2015 1553   RDW 18.3 (H) 11/07/2010 0917   LYMPHSABS 1.1 09/06/2016 1412   LYMPHSABS 1.9 12/27/2015 1553   LYMPHSABS 1.6 11/07/2010 0917   MONOABS 0.9 09/06/2016 1412   MONOABS 1.4 (H) 11/07/2010 0917   EOSABS 0.5 09/06/2016 1412   EOSABS 0.2 12/27/2015 1553   BASOSABS 0.0 09/06/2016 1412   BASOSABS 0.0 12/27/2015 1553   BASOSABS 0.0 11/07/2010 0917      Chemistry      Component Value Date/Time   NA 139 09/06/2016 1412   NA 140 12/27/2015 1553   K 3.6 09/06/2016 1412   CL 96 (L) 09/06/2016 1412   CO2 35 (H) 09/06/2016 1412   BUN 12 09/06/2016 1412   BUN 22 12/27/2015 1553   CREATININE 0.56 09/06/2016 1412      Component Value Date/Time   CALCIUM 9.1 09/06/2016 1412   CALCIUM 7.2 (L) 05/15/2016 1032   ALKPHOS 77 09/06/2016 1412  AST 19 09/06/2016 1412   ALT 12 (L) 09/06/2016 1412   BILITOT 0.4 09/06/2016 1412   BILITOT 0.2 10/07/2015 0859        PENDING LABS:   RADIOGRAPHIC STUDIES: I have  personally reviewed the radiological images as listed and agreed with the findings in the report.  NUCLEAR MEDICINE PET SKULL BASE TO THIGH 06/04/2016  IMPRESSION: 1. No residual hypermetabolism within previously hypermetabolic lesions in the left axilla, right latissimus dorsi muscle, anterior right pleural space, liver, left external iliac lymph node and bones. 2. Aortic atherosclerosis (ICD10-170.0). Coronary artery calcification.    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  Complicated by a LEFT invasive lobular carcinoma, Stage IIB (pT3pN2A), S/P left mastectomy by Dr. Arnoldo Morale on 12/16/2015, ER/PR+ 2%.  HER2 NEGATIVE.  Done prior to knowing about Stage IV disease. AND History with right invasive ductal breast cancer, Stage IIIC, ER/PR+ and HER 2 POSITIVE in 2012 managed with right mastectomy, systemic chemotherapy, HER2 targeted therapy, XRT, and anti-endocrine therapy   She started palliative XRT to T8 pathologic fracture on 02/27/2016.  Chemo with docetaxel/herceptin/perjeta for 6 cycles: 03/06/16-06/26/16.  PLAN: I have reviewed patient's restaging PET with her in detail. She appears to have recurrent disease in the right lung pleural with possible new malignant pleural effusion. Her bone disease is stable. I have told the patient that I will take her off the nerlynx and place her on kadcyla q21 days instead. I have reviewed side effects of kadcyla including decrease in cardiac ejection fraction with her in detail. She will get formal teaching on the side effects again by our nurse navigator [facility]. Her last MUGA on 04/10/16 demonstrated an EF of 56%. I have ordered a stat repeat ECHO for a new baseline. Plan to repeat her restaging PET in 3 months time.  Continue xgeva q4weeks RTC in 4 weeks for follow up with  labs.  All questions were answered. The patient knows to call the clinic with any problems, questions or concerns. We can certainly see the patient much sooner if necessary.  This note is electronically signed by: Twana First, MD 09/06/2016 2:54 PM

## 2016-09-06 NOTE — Progress Notes (Signed)
Sheila Garrison tolerated Xgeva injection well without complaints or incident. Calcium 9.1 Pt denies any tooth,jaw or leg pain or any recent or future dental appts. Pt continues to take her PO Calcium as prescribed. Pt discharged self ambulatory using cane in satisfactory condition accompanied by a friend

## 2016-09-06 NOTE — Patient Instructions (Signed)
Sheila Garrison   CHEMOTHERAPY INSTRUCTIONS  We are going to start you on Kadcyla.  You will be treated every 3 weeks.  The first Kadcyla will be 90 minutes.  The second and further infusions will be 30 minutes long.  This is with palliative intent, which means you are treatable but not curable.   You will see the doctor regularly throughout treatment.  We monitor your lab work prior to every treatment.  The doctor monitors your response to treatment by the way you are feeling, your blood work, and scans periodically.   You will receive tylenol and benadryl prior to every treatment of kadcyla to help prevent any reaction to the drug.     POTENTIAL SIDE EFFECTS OF TREATMENT:  Ado-trastuzumab emtansine (Kadcyla)  About This Drug Ado-trastuzumab emtansine is used to treat cancer. It is given in the vein (IV).  Possible Side Effects . Decrease in the number of platelets. This may raise your risk of bleeding. . Nausea . Constipation (not able to move bowels) . Bleeding . Headache . Changes in your liver function . Nosebleed . Tiredness . Joint, muscle and/or bone pain Note: Each of the side effects above was reported in 25% or greater of patients treated with ado-trastuzumab emtansine. Not all possible side effects are included above.  Warnings and Precautions . Inflammation (swelling) of the lungs. You may have a dry cough or trouble breathing. . While you are getting this drug in your vein (IV), you may have a reaction to the drug. Sometimes you may be given medication to stop or lessen these side effects. Your nurse will check you closely for these signs: fever or shaking chills, flushing, facial swelling, feeling dizzy, headache, trouble breathing, rash, itching, chest tightness, or chest pain. These reactions may happen after your infusion. If this happens, call 911 for emergency care. . Skin and tissue irritation may involve redness, pain, warmth,  or swelling at the IV site. This happens if the drug leaks out of the vein and into nearby tissue. . Changes in your liver function, which can very rarely cause liver failure. . Changes in your heart function. . Abnormal bleeding, which can very rarely be fatal - symptoms may be coughing up blood, throwing up blood (may look like coffee grounds), red or black tarry bowel movements, abnormally heavy menstrual flow, nosebleeds or any other unusual bleeding. . Effects on the nerves are called peripheral neuropathy. You may feel numbness, tingling, or pain in your hands and feet. It may be hard for you to button your clothes, open jars, or walk as usual. The effect on the nerves may get worse with more doses of the drug. These effects get better in some people after the drug is stopped but it does not get better in all people.  Important Information . Notify your doctor right away if you get pregnant during treatment or within 7 months of receiving treatment. There is a pregnancy exposure registry and a pregnancy pharmacovigilance program which monitors the effect on your pregnancy. It is recommended that you register with the [employer] pregnancy registry and report your pregnancy to [place]. http:// www.motherpregnancyregistry.com/ . This drug may be present in the saliva, tears, sweat, urine, stool, vomit, semen, and vaginal secretions. Talk to your doctor and/or your nurse about the necessary precautions to take during this time.  Treating Side Effects . Manage tiredness by pacing your activities for the day. . Be sure to include periods of rest between  energy-draining activities. . To decrease your risk of bleeding, use a soft toothbrush. Check with your nurse before using dental floss. . Be very careful when using knives or tools . Use an electric shaver instead of a razor . Ask your doctor or nurse about medicines that are available to help stop or lessen constipation. . If you are not  able to move your bowels, check with your doctor or nurse before you use enemas, laxatives, or suppositories . Drink plenty of fluids (a minimum of eight glasses per day is recommended). . If you throw up or have loose bowel movements, you should drink more fluids so that you do not become dehydrated (lack water in the body from losing too much fluid). . To help with nausea and vomiting, eat small, frequent meals instead of three large meals a day. Choose foods and drinks that are at room temperature. Ask your nurse or doctor about other helpful tips and medicine that is available to help or stop lessen these symptoms. . Infusion reactions may happen for 24 hours after your infusion. If this happens, call 911 for emergency care. Marland Kitchen Keeping your pain under control is important to your well-being. Please tell your doctor or nurse if you are experiencing pain. . If you have numbness and tingling in your hands and feet, be careful when cooking, walking, and handling sharp objects and hot liquids. . If you have a nose bleed, sit with your head tipped slightly forward. Apply pressure by lightly pinching the bridge of your nose between your thumb and forefinger. Call your doctor if you feel dizzy or faint or if the bleeding doesn't stop after 10 to 15 minutes.  Food and Drug Interactions . There are no known interactions of ado-trastuzumab emtansine with food. . Check with your doctor or pharmacist about all other prescription medicines and dietary supplements you are taking before starting this medicine as there are known drug interactions with ado-trastuzumab emtansine. Also, check with your doctor or pharmacist before starting any new prescription or over-the-counter medicines, or dietary supplement to make sure that there are no interactions.  When to Call the Doctor Call your doctor or nurse if you have any of these symptoms and/or any new or unusual symptoms: . Fever of 100.5 F (38 C) or  higher . Chills . Pain in your chest . Dry cough . Trouble breathing . Swelling of legs, ankles, or feet . Weight gain of 5 pounds in one week (fluid retention) . Easy bleeding or bruising . Blood in your urine, vomit (bright red or coffee-ground) and/or stools ( bright red, or black/tarry) . Coughing up blood . No bowel movement in 3 days or when you feel uncomfortable. . Nausea that stops you from eating or drinking and/or is not relieved by prescribed medicines . Throwing up more than 3 times a day . Fatigue that interferes with your daily activities . Signs of infusion reaction: fever or shaking chills, flushing, facial swelling, feeling dizzy, headache, trouble breathing, rash, itching, chest tightness, or chest pain. . Pain that does not go away, or is not relieved by prescribed medicines . Numbness, tingling, or pain your hands and feet . Nose bleed that doesn't stop bleeding after 10 -15 minutes . Feeling dizzy or lightheaded . Signs of possible liver problems: dark urine, pale bowel movements, bad stomach pain, feeling very tired and weak, unusual itching, or yellowing of the eyes or skin . While you are getting this drug, please tell your nurse right  away if you have any pain, redness, or swelling at the site of the IV infusion . If you think you may be pregnant or may have impregnated your partner  Reproduction Warnings . Pregnancy warning: This drug can have harmful effects on the unborn baby. Women of child bearing potential should use effective methods of birth control during your cancer treatment and for at least 7 months after treatment. Men with female partners of child bearing potential should use effective methods of birth control during your cancer treatment and for at least 4 months after your cancer treatment. Let your doctor know right away if you think you may be pregnant or may have impregnated your partner. . Breastfeeding warning: Women should not breast  feed during treatment and for 7 months after treatment because this drug could enter the breast milk and cause harm to a breast feeding baby. . Fertility warning: In men and women both, this drug may affect your ability to have children in the future. Talk with your doctor or nurse if you plan to have children. Ask for information on sperm or egg banking.   SELF CARE ACTIVITIES WHILE ON CHEMOTHERAPY: Hydration Increase your fluid intake 48 hours prior to treatment and drink at least 8 to 12 cups (64 ounces) of water/decaff beverages per day after treatment. You can still have your cup of coffee or soda but these beverages do not count as part of your 8 to 12 cups that you need to drink daily. No alcohol intake.  Medications Continue taking your normal prescription medication as prescribed.  If you start any new herbal or new supplements please let us know first to make sure it is safe.  Mouth Care Have teeth cleaned professionally before starting treatment. Keep dentures and partial plates clean. Use soft toothbrush and do not use mouthwashes that contain alcohol. Biotene is a good mouthwash that is available at most pharmacies or may be ordered by calling [phone]. Use warm salt water gargles (1 teaspoon salt per 1 quart warm water) before and after meals and at bedtime. Or you may rinse with 2 tablespoons of three-percent hydrogen peroxide mixed in eight ounces of water. If you are still having problems with your mouth or sores in your mouth please call the clinic. If you need dental work, please let the doctor know before you go for your appointment so that we can coordinate the best possible time for you in regards to your chemo regimen. You need to also let your dentist know that you are actively taking chemo. We may need to do labs prior to your dental appointment.   Skin Care Always use sunscreen that has not expired and with SPF (Sun Protection Factor) of 50 or higher. Wear hats to  protect your head from the sun. Remember to use sunscreen on your hands, ears, face, & feet.  Use good moisturizing lotions such as udder cream, eucerin, or even Vaseline. Some chemotherapies can cause dry skin, color changes in your skin and nails.    . Avoid long, hot showers or baths. . Use gentle, fragrance-free soaps and laundry detergent. . Use moisturizers, preferably creams or ointments rather than lotions because the thicker consistency is better at preventing skin dehydration. Apply the cream or ointment within 15 minutes of showering. Reapply moisturizer at night, and moisturize your hands every time after you wash them.  Hair Loss (if your doctor says your hair will fall out)  . If your doctor says that your hair  is likely to fall out, decide before you begin chemo whether you want to wear a wig. You may want to shop before treatment to match your hair color. . Hats, turbans, and scarves can also camouflage hair loss, although some people prefer to leave their heads uncovered. If you go bare-headed outdoors, be sure to use sunscreen on your scalp. . Cut your hair short. It eases the inconvenience of shedding lots of hair, but it also can reduce the emotional impact of watching your hair fall out. . Don't perm or color your hair during chemotherapy. Those chemical treatments are already damaging to hair and can enhance hair loss. Once your chemo treatments are done and your hair has grown back, it's OK to resume dyeing or perming hair. With chemotherapy, hair loss is almost always temporary. But when it grows back, it may be a different color or texture. In older adults who still had hair color before chemotherapy, the new growth may be completely gray.  Often, new hair is very fine and soft.  Infection Prevention Please wash your hands for at least 30 seconds using warm soapy water. Handwashing is the #1 way to prevent the spread of germs. Stay away from sick people or people who are  getting over a cold. If you develop respiratory systems such as green/yellow mucus production or productive cough or persistent cough let us know and we will see if you need an antibiotic. It is a good idea to keep a pair of gloves on when going into grocery stores/[employer] to decrease your risk of coming into contact with germs on the carts, etc. Carry alcohol hand gel with you at all times and use it frequently if out in public. If your temperature reaches 100.5 or higher please call the clinic and let us know.  If it is after hours or on the weekend please go to the ER if your temperature is over 100.5.  Please have your own personal thermometer at home to use.    Sex and bodily fluids If you are going to have sex, a condom must be used to protect the person that isn't taking chemotherapy. Chemo can decrease your libido (sex drive). For a few days after chemotherapy, chemotherapy can be excreted through your bodily fluids.  When using the toilet please close the lid and flush the toilet twice.  Do this for a few day after you have had chemotherapy.     Effects of chemotherapy on your sex life Some changes are simple and won't last long. They won't affect your sex life permanently. Sometimes you may feel: . too tired . not strong enough to be very active . sick or sore  . not in the mood . anxious or low Your anxiety might not seem related to sex. For example, you may be worried about the cancer and how your treatment is going. Or you may be worried about money, or about how you family are coping with your illness. These things can cause stress, which can affect your interest in sex. It's important to talk to your partner about how you feel. Remember - the changes to your sex life don't usually last long. There's usually no medical reason to stop having sex during chemo. The drugs won't have any long term physical effects on your performance or enjoyment of sex. Cancer can't be passed on to your  partner during sex  Contraception It's important to use reliable contraception during treatment. Avoid getting pregnant while you or  your partner are having chemotherapy. This is because the drugs may harm the baby. Sometimes chemotherapy drugs can leave a man or woman infertile.  This means you would not be able to have children in the future. You might want to talk to someone about permanent infertility. It can be very difficult to learn that you may no longer be able to have children. Some people find counselling helpful. There might be ways to preserve your fertility, although this is easier for men than for women. You may want to speak to a fertility expert. You can talk about sperm banking or harvesting your eggs. You can also ask about other fertility options, such as donor eggs. If you have or have had breast cancer, your doctor might advise you not to take the contraceptive pill. This is because the hormones in it might affect the cancer.  It is not known for sure whether or not chemotherapy drugs can be passed on through semen or secretions from the vagina. Because of this some doctors advise people to use a barrier method if you have sex during treatment. This applies to vaginal, anal or oral sex. Generally, doctors advise a barrier method only for the time you are actually having the treatment and for about a week after your treatment. Advice like this can be worrying, but this does not mean that you have to avoid being intimate with your partner. You can still have close contact with your partner and continue to enjoy sex.  Animals If you have cats or birds we just ask that you not change the litter or change the cage.  Please have someone else do this for you while you are on chemotherapy.   Food Safety During and After Cancer Treatment Food safety is important for people both during and after cancer treatment. Cancer and cancer treatments, such as chemotherapy, radiation therapy, and  stem cell/bone marrow transplantation, often weaken the immune system. This makes it harder for your body to protect itself from foodborne illness, also called food poisoning. Foodborne illness is caused by eating food that contains harmful bacteria, parasites, or viruses.  Foods to avoid Some foods have a higher risk of becoming tainted with bacteria. These include: Marland Kitchen Unwashed fresh fruit and vegetables, especially leafy vegetables that can hide dirt and other contaminants . Raw sprouts, such as alfalfa sprouts . Raw or undercooked beef, especially ground beef, or other raw or undercooked meat and poultry . Fatty, fried, or spicy foods immediately before or after treatment.  These can sit heavy on your stomach and make you feel nauseous. . Raw or undercooked shellfish, such as oysters. . Sushi and sashimi, which often contain raw fish.  . Unpasteurized beverages, such as unpasteurized fruit juices, raw milk, raw yogurt, or cider . Undercooked eggs, such as soft boiled, over easy, and poached; raw, unpasteurized eggs; or foods made with raw egg, such as homemade raw cookie dough and homemade mayonnaise Simple steps for food safety Shop smart. . Do not buy food stored or displayed in an unclean area. . Do not buy bruised or damaged fruits or vegetables. . Do not buy cans that have cracks, dents, or bulges. . Pick up foods that can spoil at the end of your shopping trip and store them in a cooler on the way home. Prepare and clean up foods carefully. . Rinse all fresh fruits and vegetables under running water, and dry them with a clean towel or paper towel. . Clean the top of cans  before opening them. . After preparing food, wash your hands for 20 seconds with hot water and soap. Pay special attention to areas between fingers and under nails. . Clean your utensils and dishes with hot water and soap. Marland Kitchen Disinfect your kitchen and cutting boards using 1 teaspoon of liquid, unscented bleach mixed  into 1 quart of water.   Dispose of old food. . Eat canned and packaged food before its expiration date (the "use by" or "best before" date). . Consume refrigerated leftovers within 3 to 4 days. After that time, throw out the food. Even if the food does not smell or look spoiled, it still may be unsafe. Some bacteria, such as Listeria, can grow even on foods stored in the refrigerator if they are kept for too long. Take precautions when eating out. . At restaurants, avoid buffets and salad bars where food sits out for a long time and comes in contact with many people. Food can become contaminated when someone with a virus, often a norovirus, or another "bug" handles it. . Put any leftover food in a "to-go" container yourself, rather than having the server do it. And, refrigerate leftovers as soon as you get home. . Choose restaurants that are clean and that are willing to prepare your food as you order it cooked.    MEDICATIONS:                                                                                                                                                              Zofran/Ondansetron 8mg  tablet. Take 1 tablet every 8 hours as needed for nausea/vomiting. (#1 nausea med to take, this can constipate)  Compazine/Prochlorperazine 10mg  tablet. Take 1 tablet every 6 hours as needed for nausea/vomiting. (#2 nausea med to take, this can make you sleepy)   EMLA cream. Apply a quarter size amount to port site 1 hour prior to chemo. Do not rub in. Cover with plastic wrap.   Over-the-Counter Meds:  Miralax 1 capful in 8 oz of fluid daily. May increase to two times a day if needed. This is a stool softener. If this doesn't work proceed you can add:  Senokot S-start with 1 tablet two times a day and increase to 4 tablets two times a day if needed. (total of 8 tablets in a 24 hour period). This is a stimulant laxative.   Call us if this does not help your bowels move.   Imodium 2mg   capsule. Take 2 capsules after the 1st loose stool and then 1 capsule every 2 hours until you go a total of 12 hours without having a loose stool. Call the [facility] if loose stools continue. If diarrhea occurs @ bedtime, take 2 capsules @ bedtime. Then take 2 capsules every 4 hours until morning.  Call [facility].     Diarrhea Sheet  If you are having loose stools/diarrhea, please purchase Imodium and begin taking as outlined:  At the first sign of poorly formed or loose stools you should begin taking Imodium(loperamide) 2 mg capsules.  Take two caplets (4mg ) followed by one caplet (2mg ) every 2 hours until you have had no diarrhea for 12 hours.  During the night take two caplets (4mg ) at bedtime and continue every 4 hours during the night until the morning.  Stop taking Imodium only after there is no sign of diarrhea for 12 hours.    Always call the [facility] if you are having loose stools/diarrhea that you can't get under control.  Loose stools/disrrhea leads to dehydration (loss of water) in your body.  We have other options of trying to get the loose stools/diarrhea to stopped but you must let us know!     Constipation Sheet *Miralax in 8 oz of fluid daily.  May increase to two times a day if needed.  This is a stool softener.  If this not enough to keep your bowel regular:  You can add:  *Senokot S, start with one tablet twice a day and can increase to 4 tablets twice a day if needed.  This is a stimulant laxative.   Sometimes when you take pain medication you need BOTH a medicine to keep your stool soft and a medicine to help your bowel push it out!  Please call if the above does not work for you.   Do not go more than 2 days without a bowel movement.  It is very important that you do not become constipated.  It will make you feel sick to your stomach (nausea) and can cause abdominal pain and vomiting.     Nausea Sheet  Zofran/Ondansetron 8mg  tablet. Take 1 tablet  every 8 hours as needed for nausea/vomiting. (#1 nausea med to take, this can constipate)  Compazine/Prochlorperazine 10mg  tablet. Take 1 tablet every 6 hours as needed for nausea/vomiting. (#2 nausea med to take, this can make you sleepy)  You can take these medications together or separately.  We would first like for you to try the Ondansetron by itself and then take the Prochloperizine if needed. But you are allowed to take both medications at the same time if your nausea is that severe.  If you are having persistent nausea (nausea that does not stop) please take these medications on a staggered schedule so that the nausea medication stays in your body.  Please call the [facility] and let us know the amount of nausea that you are experiencing.  If you begin to vomit, you need to call the [facility] and if it is the weekend and you have vomited more than one time and cant get it to stop-go to the Emergency Room.  Persistent nausea/vomiting can lead to dehydration (loss of fluid in your body) and will make you feel terrible.   Ice chips, sips of clear liquids, foods that are @ room temperature, crackers, and toast tend to be better tolerated.     SYMPTOMS TO REPORT AS SOON AS POSSIBLE AFTER TREATMENT:  FEVER GREATER THAN 100.5 F  CHILLS WITH OR WITHOUT FEVER  NAUSEA AND VOMITING THAT IS NOT CONTROLLED WITH YOUR NAUSEA MEDICATION  UNUSUAL SHORTNESS OF BREATH  UNUSUAL BRUISING OR BLEEDING  TENDERNESS IN MOUTH AND THROAT WITH OR WITHOUT PRESENCE OF ULCERS  URINARY PROBLEMS  BOWEL PROBLEMS  UNUSUAL RASH  Wear comfortable clothing and clothing appropriate for easy access to any Portacath or PICC line. Let us know if there is anything that we can do to make your therapy better!    What to do if you need assistance after hours or on the weekends: CALL [phone].  HOLD on the line, do not hang up.  You will hear multiple messages but at the end you will be connected with a  nurse triage line.  They will contact the doctor if necessary.  Most of the time they will be able to assist you.  Do not call the hospital operator.       I have been informed and understand all of the instructions given to me and have received a copy. I have been instructed to call the clinic [phone] or my family physician as soon as possible for continued medical care, if indicated. I do not have any more questions at this time but understand that I may call the [facility] or the Patient Navigator at [phone] during office hours should I have questions or need assistance in obtaining follow-up care.

## 2016-09-06 NOTE — Telephone Encounter (Signed)
Per Thor [phone] does not req auth. However I need to call [employer] to see if Steward Drone is covered under the plan. Per Summer H it is covered by plan  [employer] [phone] MELODY S [phone]. EXT [id number] CASE MGT FAX# [phone]

## 2016-09-07 ENCOUNTER — Ambulatory Visit (HOSPITAL_COMMUNITY): Payer: PRIVATE HEALTH INSURANCE

## 2016-09-07 ENCOUNTER — Other Ambulatory Visit (HOSPITAL_COMMUNITY): Payer: Self-pay | Admitting: Pharmacist

## 2016-09-07 ENCOUNTER — Other Ambulatory Visit (HOSPITAL_COMMUNITY): Payer: Self-pay | Admitting: Oncology

## 2016-09-07 DIAGNOSIS — F411 Generalized anxiety disorder: Secondary | ICD-10-CM

## 2016-09-07 LAB — CANCER ANTIGEN 27.29: CAN 27.29: 22.2 U/mL (ref 0.0–38.6)

## 2016-09-07 LAB — CANCER ANTIGEN 15-3: CAN 15 3: 16.6 U/mL (ref 0.0–25.0)

## 2016-09-07 MED ORDER — LORAZEPAM 0.5 MG PO TABS: 0.5000 mg | ORAL_TABLET | Freq: Three times a day (TID) | ORAL | 2 refills | Status: DC

## 2016-09-11 ENCOUNTER — Encounter (HOSPITAL_COMMUNITY): Payer: Self-pay

## 2016-09-11 ENCOUNTER — Encounter (HOSPITAL_COMMUNITY): Payer: PRIVATE HEALTH INSURANCE

## 2016-09-11 ENCOUNTER — Encounter (HOSPITAL_BASED_OUTPATIENT_CLINIC_OR_DEPARTMENT_OTHER): Payer: PRIVATE HEALTH INSURANCE

## 2016-09-11 ENCOUNTER — Ambulatory Visit (HOSPITAL_COMMUNITY)
Admission: RE | Admit: 2016-09-11 | Discharge: 2016-09-11 | Disposition: A | Discharge status: Home / Self Care | Payer: PRIVATE HEALTH INSURANCE | Source: Ambulatory Visit | Attending: Oncology | Admitting: Oncology

## 2016-09-11 ENCOUNTER — Encounter (HOSPITAL_COMMUNITY): Payer: PRIVATE HEALTH INSURANCE | Admitting: Dietician

## 2016-09-11 VITALS — BP 117/51 | HR 75 | Temp 97.5°F | Resp 18 | Wt 145.4 lb

## 2016-09-11 DIAGNOSIS — E785 Hyperlipidemia, unspecified: Secondary | ICD-10-CM | POA: Diagnosis not present

## 2016-09-11 DIAGNOSIS — I10 Essential (primary) hypertension: Secondary | ICD-10-CM | POA: Diagnosis not present

## 2016-09-11 DIAGNOSIS — Z5112 Encounter for antineoplastic immunotherapy: Secondary | ICD-10-CM | POA: Diagnosis not present

## 2016-09-11 DIAGNOSIS — C50912 Malignant neoplasm of unspecified site of left female breast: Secondary | ICD-10-CM

## 2016-09-11 DIAGNOSIS — C7951 Secondary malignant neoplasm of bone: Secondary | ICD-10-CM | POA: Diagnosis not present

## 2016-09-11 DIAGNOSIS — Z170 Estrogen receptor positive status [ER+]: Secondary | ICD-10-CM | POA: Diagnosis not present

## 2016-09-11 DIAGNOSIS — C787 Secondary malignant neoplasm of liver and intrahepatic bile duct: Secondary | ICD-10-CM | POA: Diagnosis not present

## 2016-09-11 DIAGNOSIS — J449 Chronic obstructive pulmonary disease, unspecified: Secondary | ICD-10-CM | POA: Diagnosis not present

## 2016-09-11 DIAGNOSIS — Z87891 Personal history of nicotine dependence: Secondary | ICD-10-CM | POA: Insufficient documentation

## 2016-09-11 DIAGNOSIS — I071 Rheumatic tricuspid insufficiency: Secondary | ICD-10-CM | POA: Insufficient documentation

## 2016-09-11 DIAGNOSIS — C50112 Malignant neoplasm of central portion of left female breast: Secondary | ICD-10-CM | POA: Insufficient documentation

## 2016-09-11 MED ORDER — SODIUM CHLORIDE 0.9 % IV SOLN
Freq: Once | INTRAVENOUS | Status: AC
  Administered 2016-09-11: 11:00:00 via INTRAVENOUS

## 2016-09-11 MED ORDER — HEPARIN SOD (PORK) LOCK FLUSH 100 UNIT/ML IV SOLN
500.0000 [IU] | Freq: Once | INTRAVENOUS | Status: AC | PRN
  Administered 2016-09-11: 500 [IU]
  Filled 2016-09-11: qty 5

## 2016-09-11 MED ORDER — SODIUM CHLORIDE 0.9 % IV SOLN
4.0000 mg/kg | Freq: Once | INTRAVENOUS | Status: AC
  Administered 2016-09-11: 260 mg via INTRAVENOUS
  Filled 2016-09-11: qty 8

## 2016-09-11 MED ORDER — SODIUM CHLORIDE 0.9% FLUSH
10.0000 mL | INTRAVENOUS | Status: DC | PRN
  Administered 2016-09-11: 10 mL
  Filled 2016-09-11: qty 10

## 2016-09-11 MED ORDER — ACETAMINOPHEN 325 MG PO TABS
ORAL_TABLET | ORAL | Status: AC
  Filled 2016-09-11: qty 2

## 2016-09-11 MED ORDER — ACETAMINOPHEN 325 MG PO TABS
650.0000 mg | ORAL_TABLET | Freq: Once | ORAL | Status: AC
  Administered 2016-09-11: 650 mg via ORAL

## 2016-09-11 MED ORDER — DIPHENHYDRAMINE HCL 25 MG PO CAPS
50.0000 mg | ORAL_CAPSULE | Freq: Once | ORAL | Status: AC
  Administered 2016-09-11: 50 mg via ORAL

## 2016-09-11 MED ORDER — DIPHENHYDRAMINE HCL 25 MG PO CAPS
ORAL_CAPSULE | ORAL | Status: AC
  Filled 2016-09-11: qty 2

## 2016-09-11 NOTE — Patient Instructions (Signed)
[facility] Discharge Instructions for Patients Receiving Chemotherapy   If you have a lab appointment with the [facility] please come in thru the  Main Entrance and check in at the main information desk   Today you received the following chemotherapy agents: Kadcyla  To help prevent nausea and vomiting after your treatment, we encourage you to take your nausea medication as prescribed.   If you develop nausea and vomiting, or diarrhea that is not controlled by your medication, call the clinic.  The clinic phone number is [phone]. Office hours are Monday-Friday 8:30am-5:00pm.  BELOW ARE SYMPTOMS THAT SHOULD BE REPORTED IMMEDIATELY:  *FEVER GREATER THAN 101.0 F  *CHILLS WITH OR WITHOUT FEVER  NAUSEA AND VOMITING THAT IS NOT CONTROLLED WITH YOUR NAUSEA MEDICATION  *UNUSUAL SHORTNESS OF BREATH  *UNUSUAL BRUISING OR BLEEDING  TENDERNESS IN MOUTH AND THROAT WITH OR WITHOUT PRESENCE OF ULCERS  *URINARY PROBLEMS  *BOWEL PROBLEMS  UNUSUAL RASH Items with * indicate a potential emergency and should be followed up as soon as possible. If you have an emergency after office hours please contact your primary care physician or go to the nearest emergency department.  Please call the clinic during office hours if you have any questions or concerns.   You may also contact the Patient Navigator at [phone] should you have any questions or need assistance in obtaining follow up care.      Resources For Cancer Patients and their Caregivers ? American Cancer Society: Can assist with transportation, wigs, general needs, runs Look Good Feel Better.        ([phone] [phone] ? Cancer Care: Provides financial assistance, online support groups, medication/co-pay assistance.  1-800-813-HOPE 504 376 0199) ? [facility] Assists [place] cancer patients and their families through emotional , educational and financial support.   ([phone] ? [place] DSS Where to apply for food stamps, Medicaid and utility assistance. 720-373-2812 ? [facility]: Transportation to medical appointments. 860-847-6483 ? Social Security Administration: May apply for disability if have a Stage IV cancer. [phone] ? [employer], Disability and Transit Services: Assists with nutrition, care and transit needs. [phone]

## 2016-09-11 NOTE — Progress Notes (Signed)
Labs reviewed from 8/2 with provider. Okay to proceed with treatment.

## 2016-09-11 NOTE — Progress Notes (Signed)
Nutrition Follow-up:  ASSESSMENT: 61 y/o female PMHx Anxiety, HTN, HLD, COPD and tobacco abuse. 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 Underwent palliative radiation and then IV chemo. Had complete response. Transitioned to Oral chemo.   Patient had PET scan end of July which demonstrated recurrence. She is to be restarted on IV chemotherapy. RD meeting with patient to assess status prior to this new regimen.   Patient states that she did poorly with her last IV chemo regimen in February. She had a very poor appetite, severe fatigue, chest pain, and other miscellaneous side effects. She says this has resolved.  She is eating at least 3 meals a day now. She additionally is drinking Premier Protein because she found out "my protein was low". She acknowledges that she does not eat much meat, other than some lunch meat.  Denies any n/v/c/d with her oral chemotherapy.   Per review of weight history. Pt's weight had been stable at 149-151 lbs from time of dx up until February, where her poor tolerance to chemo cost her ~5 lbs. Has more or less maintained her weight since then.   Medications: Lasix, Zofran, compazine, mvi, Kadcyla, Ativan, imodium, ppi  Labs:   Recent Labs Lab 09/06/16 1412  NA 139  K 3.6  CL 96*  CO2 35*  BUN 12  CREATININE 0.56  CALCIUM 9.1  GLUCOSE 132*    Anthropometrics:  Height: 5' 4.5"  Weight: 145.4 lbs (66.1 kg) BMI: 24.6  Wt Readings from Last 10 Encounters:  09/11/16 145 lb 6.4 oz (66 kg)  09/06/16 142 lb 14.4 oz (64.8 kg)  08/09/16 147 lb 12.8 oz (67 kg)  07/24/16 144 lb (65.3 kg)  06/26/16 133 lb 9.6 oz (60.6 kg)  06/05/16 140 lb 3.2 oz (63.6 kg)  05/22/16 144 lb 9.6 oz (65.6 kg)  05/15/16 147 lb (66.7 kg)  04/24/16 149 lb 3.2 oz (67.7 kg)  04/03/16 147 lb (66.7 kg)   Estimated Energy Needs Kcals:  1800-2000 kcals (27-30 kcal/kg bw) Protein: 79-92g Pro (1.2-1.4 g/kg bw) Fluid: >2 Liters (30 ml/kg)  NUTRITION DIAGNOSIS: Increased Protein/kcal needs related to cancer and cancer related treatments as evidenced by the nutritional recommendations for this condition/therapy  MALNUTRITION DIAGNOSIS:n/a  INTERVENTION:   She seems to be doing well, nutritionally, at this time. She says she is happy with her current weight and does not want to regain. RD emphasized that though it is not imperative she regain weight, she should do her best to maintain weight, as loss is a sign her body is not receiving adequate nutrition.   Commented that premier protein is fairly low in kcals. If wt loss does become an issue, she should switch to a higher kcal containing beverage. Her friend asked about how many premier proteins she should consume/day to meet protein needs. She should not have any trouble meeting protein needs with only 1-2 two, given her good intake.   RD stated that he is always available if she runs into any adverse effects from this new chemo regimen. She reflects understanding and thanks RD for support.   No interventions at this time.   MONITORING, EVALUATION, GOAL: Weight, chemo tolerance, intake  NEXT VISIT: As needed.   Burtis Junes RD, LDN, CNSC Clinical Nutrition Pager: [phone] 09/11/2016 11:49 AM

## 2016-09-11 NOTE — Progress Notes (Signed)
Patient presented today for chemotherapy per MD orders. Patient tolerated treatment without incidence. Patient monitored for 90 minutes post treatment without incidence.  Patient discharged ambulatory with her personal oxygen tank on 4L via [place] in stable condition with friend. Patient to follow up as scheduled.

## 2016-09-11 NOTE — Progress Notes (Signed)
*  PRELIMINARY RESULTS* Echocardiogram 2D Echocardiogram has been performed.  Sheila Garrison 09/11/2016, 9:27 AM

## 2016-09-13 ENCOUNTER — Telehealth (HOSPITAL_COMMUNITY): Payer: Self-pay

## 2016-09-13 NOTE — Telephone Encounter (Signed)
24 hour follow up - left message for patient to call us if she had any questions.

## 2016-09-21 ENCOUNTER — Other Ambulatory Visit: Payer: Self-pay | Admitting: Family

## 2016-09-27 ENCOUNTER — Other Ambulatory Visit (HOSPITAL_COMMUNITY): Payer: Self-pay | Admitting: Oncology

## 2016-10-02 ENCOUNTER — Encounter (HOSPITAL_COMMUNITY): Payer: PRIVATE HEALTH INSURANCE | Admitting: Adult Health

## 2016-10-02 ENCOUNTER — Encounter (HOSPITAL_BASED_OUTPATIENT_CLINIC_OR_DEPARTMENT_OTHER): Payer: PRIVATE HEALTH INSURANCE

## 2016-10-02 VITALS — BP 127/48 | HR 72 | Temp 98.4°F | Resp 20 | Wt 143.6 lb

## 2016-10-02 DIAGNOSIS — C7951 Secondary malignant neoplasm of bone: Secondary | ICD-10-CM | POA: Diagnosis not present

## 2016-10-02 DIAGNOSIS — C50912 Malignant neoplasm of unspecified site of left female breast: Secondary | ICD-10-CM | POA: Diagnosis not present

## 2016-10-02 DIAGNOSIS — Z5112 Encounter for antineoplastic immunotherapy: Secondary | ICD-10-CM | POA: Diagnosis not present

## 2016-10-02 DIAGNOSIS — C787 Secondary malignant neoplasm of liver and intrahepatic bile duct: Secondary | ICD-10-CM | POA: Diagnosis not present

## 2016-10-02 LAB — CBC WITH DIFFERENTIAL/PLATELET
Basophils Absolute: 0 10*3/uL (ref 0.0–0.1)
Basophils Relative: 0 %
Eosinophils Absolute: 0.4 10*3/uL (ref 0.0–0.7)
Eosinophils Relative: 3 %
HEMATOCRIT: 36 % (ref 36.0–46.0)
HEMOGLOBIN: 11.1 g/dL — AB (ref 12.0–15.0)
LYMPHS ABS: 1.6 10*3/uL (ref 0.7–4.0)
LYMPHS PCT: 15 %
MCH: 27.6 pg (ref 26.0–34.0)
MCHC: 30.8 g/dL (ref 30.0–36.0)
MCV: 89.6 fL (ref 78.0–100.0)
MONO ABS: 1.1 10*3/uL — AB (ref 0.1–1.0)
MONOS PCT: 10 %
NEUTROS ABS: 8.1 10*3/uL — AB (ref 1.7–7.7)
NEUTROS PCT: 72 %
Platelets: 267 10*3/uL (ref 150–400)
RBC: 4.02 MIL/uL (ref 3.87–5.11)
RDW: 16.2 % — AB (ref 11.5–15.5)
WBC: 11.2 10*3/uL — ABNORMAL HIGH (ref 4.0–10.5)

## 2016-10-02 LAB — COMPREHENSIVE METABOLIC PANEL
ALK PHOS: 68 U/L (ref 38–126)
ALT: 28 U/L (ref 14–54)
ANION GAP: 10 (ref 5–15)
AST: 33 U/L (ref 15–41)
Albumin: 3.8 g/dL (ref 3.5–5.0)
BILIRUBIN TOTAL: 0.4 mg/dL (ref 0.3–1.2)
BUN: 20 mg/dL (ref 6–20)
CALCIUM: 9.2 mg/dL (ref 8.9–10.3)
CO2: 33 mmol/L — ABNORMAL HIGH (ref 22–32)
Chloride: 95 mmol/L — ABNORMAL LOW (ref 101–111)
Creatinine, Ser: 0.64 mg/dL (ref 0.44–1.00)
GFR calc Af Amer: >60 mL/min (ref >60)
GLUCOSE: 119 mg/dL — AB (ref 65–99)
POTASSIUM: 3.5 mmol/L (ref 3.5–5.1)
Sodium: 138 mmol/L (ref 135–145)
TOTAL PROTEIN: 7 g/dL (ref 6.5–8.1)

## 2016-10-02 MED ORDER — HEPARIN SOD (PORK) LOCK FLUSH 100 UNIT/ML IV SOLN
500.0000 [IU] | Freq: Once | INTRAVENOUS | Status: AC | PRN
  Administered 2016-10-02: 500 [IU]

## 2016-10-02 MED ORDER — SODIUM CHLORIDE 0.9 % IV SOLN
3.9000 mg/kg | Freq: Once | INTRAVENOUS | Status: AC
  Administered 2016-10-02: 260 mg via INTRAVENOUS
  Filled 2016-10-02: qty 8

## 2016-10-02 MED ORDER — SODIUM CHLORIDE 0.9 % IV SOLN
Freq: Once | INTRAVENOUS | Status: AC
  Administered 2016-10-02: 13:00:00 via INTRAVENOUS

## 2016-10-02 MED ORDER — SODIUM CHLORIDE 0.9% FLUSH
10.0000 mL | INTRAVENOUS | Status: DC | PRN
  Administered 2016-10-02: 10 mL
  Filled 2016-10-02: qty 10

## 2016-10-02 MED ORDER — DIPHENHYDRAMINE HCL 25 MG PO CAPS: 50.0000 mg | ORAL_CAPSULE | Freq: Once | ORAL | Status: DC

## 2016-10-02 MED ORDER — ACETAMINOPHEN 325 MG PO TABS: 650.0000 mg | ORAL_TABLET | Freq: Once | ORAL | Status: DC

## 2016-10-02 NOTE — Progress Notes (Signed)
Sheila Garrison tolerated Kadcyla infusion well without complaints or incident. Labs reviewed with Mike Craze NP prior to administering this medication. VSS upon discharge. Pt discharged self ambulatory in satisfactory condition accompanied by a friend

## 2016-10-02 NOTE — Patient Instructions (Signed)
[facility] Discharge Instructions for Patients Receiving Chemotherapy   Beginning January 23rd 2017 lab work for the Cancer Center will be done in the  Main lab at [facility] on 1st floor. If you have a lab appointment with the Cancer Center please come in thru the  Main Entrance and check in at the main information desk   Today you received the following chemotherapy agents Kadcyla. Follow-up as scheduled. Call clinic for any questions or concerns  To help prevent nausea and vomiting after your treatment, we encourage you to take your nausea medication   If you develop nausea and vomiting, or diarrhea that is not controlled by your medication, call the clinic.  The clinic phone number is [phone]. Office hours are Monday-Friday 8:30am-5:00pm.  BELOW ARE SYMPTOMS THAT SHOULD BE REPORTED IMMEDIATELY:  *FEVER GREATER THAN 101.0 F  *CHILLS WITH OR WITHOUT FEVER  NAUSEA AND VOMITING THAT IS NOT CONTROLLED WITH YOUR NAUSEA MEDICATION  *UNUSUAL SHORTNESS OF BREATH  *UNUSUAL BRUISING OR BLEEDING  TENDERNESS IN MOUTH AND THROAT WITH OR WITHOUT PRESENCE OF ULCERS  *URINARY PROBLEMS  *BOWEL PROBLEMS  UNUSUAL RASH Items with * indicate a potential emergency and should be followed up as soon as possible. If you have an emergency after office hours please contact your primary care physician or go to the nearest emergency department.  Please call the clinic during office hours if you have any questions or concerns.   You may also contact the Patient Navigator at [phone] should you have any questions or need assistance in obtaining follow up care.      Resources For Cancer Patients and their Caregivers ? American Cancer Society: Can assist with transportation, wigs, general needs, runs Look Good Feel Better.        1-888-227-6333 ? Cancer Care: Provides financial assistance, online support groups, medication/co-pay assistance.  1-800-813-HOPE  (4673) ? [facility] Assists [place] cancer patients and their families through emotional , educational and financial support.  336-427-4357 ? [place] DSS Where to apply for food stamps, Medicaid and utility assistance. 336-342-1394 ? [facility]: Transportation to medical appointments. 336-347-2287 ? Social Security Administration: May apply for disability if have a Stage IV cancer. 336-342-7796 1-800-772-1213 ? [place] [facility], Disability and Transit Services: Assists with nutrition, care and transit needs. [phone]         

## 2016-10-02 NOTE — Progress Notes (Deleted)
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   CURRENT THERAPY: Kadcyla every 3 weeks, beginning 09/11/16 AND Xgeva injection every 28 days.    BRIEF ONCOLOGIC HISTORY:    Stage III (T4N3) R Br cancer    06/21/2010 Initial Diagnosis    Stage III (T4N3) R Br cancer       01/04/2011 - 02/22/2011 Radiation Therapy    50 Gy       05/07/2014 Pathology Results    BCI testing- low risk of late recurrence (3.4% between years 5-10), high likelihood of benefit from extended endocrein therapy, and a 67% relative risk reduction when treated with extended endocrine therapy (27% versus 10.5%).       [name], stage 4, left ([place])   11/22/2015 Mammogram    Ultrasound-guided biopsy of the irregular hypoechoic area, with prominent shadowing, in the OUTER left breast at the 1 o'clock axis. 2. Ultrasound-guided biopsy of the irregular mass within the INNER left breast at the 9 o'clock axis. 3. Ultrasound-guided biopsy of 1 of the enlarged and amorphous lymph nodes in the left axilla.      11/22/2015 Imaging    Ultrasound-guided biopsy of the irregular hypoechoic area, with prominent shadowing, in the OUTER left breast at the 1 o'clock axis. 2. Ultrasound-guided biopsy of the irregular mass within the INNER left breast at the 9 o'clock axis. 3. Ultrasound-guided biopsy of 1 of the enlarged and amorphous lymph nodes in the left axilla.      12/16/2015 Surgery    L modified radical mastectomy with Dr. Arnoldo Morale      12/18/2015 - 12/21/2015 Hospital Admission    Admit date: 12/18/2015 Admission diagnosis: altered MS, acute respiratory failure Additional comments: This likely was a combination  of COPD exacerbation along with mild acute on chronic diastolic CHF with  echo shows a preserved EF of around 60%, he was treated with IV steroids and Lasix, she is much better but qualifies for home oxygen which will be provided      12/19/2015 Imaging    No evidence of pulmonary embolus. 2. Small right pleural effusion, with associated atelectasis. Peripheral scarring at the anterior right upper lobe. Mild bilateral emphysema noted. 3. Scattered coronary artery calcifications seen. 4. Postoperative change at the left chest wall, reflecting recent mastectomy, with vague collections of postoperative fluid seen. 5. Vague sclerotic change and heterogeneity at vertebral body T8 raises concern for sequelae of metastatic disease      01/23/2016 PET scan    1. Widespread multifocal metastatic disease in the visualized axial and appendicular skeleton. 2. Solitary metastatic lesion in the liver. Hypermetabolic irregular left axillary lymph node adjacent to the left axillary clips. 3. Activity in the left breast related to mastectomy. 4. Low-grade activity along the pleuroparenchymal thickening anteriorly in the right chest is probably related to prior therapy rather than current malignancy. 5. Probable mild compression fracture at T8 associated with the extensive tumor at this level. There is also bony destruction of the right ninth rib posteriorly. 6. Faintly hypermetabolic but small left external iliac lymph nodes merit surveillance  ADDENDUM: The original report was by Dr. Van Clines. The following addendum is by Dr. Van Clines:  I have one  other musculoskeletal focus of hypermetabolic activity to mention. In the right latissimus dorsi muscle, there is a small hypermetabolic focus with maximum SUV of 5.4, measuring 7 mm in diameter on image 89/4, compatible with a metastatic deposit.        02/02/2016 Imaging    MRI T-spine: 1. Extensive metastatic disease  throughout the cervical and thoracic spine with a subtle pathologic compression fracture of T8. Tumor extends through the posterior margin of the T8 vertebra into the spinal canal without spinal cord compression at this time. 2. No other pathologic fractures of the thoracic spine at this time.      02/10/2016 Procedure    US biopsy of liver lesion      02/15/2016 Pathology Results    Liver, needle/core biopsy, right lobe METASTATIC CARCINOMA, CONSISTENT WITH BREAST DUCTAL CARCINOMA.      02/15/2016 - 02/28/2016 Chemotherapy    Letrozole 2.5 mg daily + Kisqali (600 mg) days 1-21 every 28 days       02/21/2016 Pathology Results    Breast Prognostic profile from liver biopsy: HER2 POSITIVE, ER/PR NEGATIVE (0%).      02/27/2016 - 03/09/2016 Radiation Therapy    Palliative XRT to T7-T11 with Dr. Lianne Cure; 30 Gy in 10 fractions.       02/28/2016 Treatment Plan Change    Due to HER2 positivity and ER/PR negativity from liver biopsy, change to systemic chemotherapy is recommended.      03/06/2016 -  Chemotherapy    Docetaxel/Herceptin/Perjeta with Neulasta support.      03/11/2016 - 03/15/2016 Hospital Admission    Admit date: 03/11/2016 Admission diagnosis: Febrile neutropenia Additional comments: COPD exacerbation      03/27/2016 Treatment Plan Change    Docetaxel dose reduced by 20%.      03/27/2016 Adverse Reaction    Demonstrating early signs of palmar-plantar erythrodysesthesia.      03/27/2016 Treatment Plan Change    Defer treatment 1 week.      04/10/2016 Imaging    MUGA- The left ventricular ejection fraction is equal to 56.2%. Normal left ventricular wall motion.      06/04/2016 PET scan    1. No residual hypermetabolism within previously hypermetabolic lesions in the left axilla, right latissimus dorsi muscle, anterior right pleural space, liver, left external iliac lymph node and bones. 2. Aortic atherosclerosis (ICD10-170.0). Coronary artery calcification.       07/25/2016 Treatment Plan Change    Started Nerlynx      09/03/2016 PET scan     Interval development of small to moderate right pleural effusion associated with a linear band of hypermetabolism in the posterior right costophrenic sulcus ( SUV max = 5.7) . Imaging features are concerning for recurrent posterior right pleural disease.        HISTORY OF PRESENT ILLNESS:  (From Kirby Crigler, PA-C's last note on 04/24/16)     INTERVAL HISTORY:  Ms. Gaal 61 y.o. female returns to cancer center for follow-up and consideration of next cycle of chemotherapy.   Due for cycle #4 of Taxotere/Herceptin/Perjeta today. Overall, she tells me she feels really well. Her appetite and energy levels are fair.  She has some shortness of breath, but it has been chronic and is largely no worse than previous.  Of note, she did have CTA chest on 03/14/16 for shortness of breath, which was negative for PE; there was noted to be mild right pleural effusion with some atelectasis at that time.   She has some numbness/tingling to her  fingertips, but it is no worse. She denies any new bone pain. Overall, she feels well and ready for her next cycle of chemotherapy.      REVIEW OF SYSTEMS:  Review of Systems  Constitutional: Negative.  Negative for chills, fatigue and fever.  HENT:  Negative.  Negative for lump/mass and nosebleeds.   Eyes: Negative.   Respiratory: Positive for cough. Negative for shortness of breath.   Cardiovascular: Negative.  Negative for chest pain and leg swelling.  Gastrointestinal: Negative.  Negative for abdominal pain, blood in stool, constipation, diarrhea, nausea and vomiting.  Endocrine: Negative.   Genitourinary: Negative.  Negative for dysuria and hematuria.   Musculoskeletal: Negative.  Negative for arthralgias.  Skin: Negative.  Negative for rash.  Neurological: Negative.  Negative for dizziness and headaches.  Hematological: Negative.  Negative for adenopathy. Does not  bruise/bleed easily.  Psychiatric/Behavioral: Negative.  Negative for depression and sleep disturbance. The patient is not nervous/anxious.      PAST MEDICAL/SURGICAL HISTORY:  Past Medical History:  Diagnosis Date  . Anxiety   . Bone metastases ([place]) 02/15/2016  . Breast CA ([place]) 07/01/2010   rt breast ca  . COPD (chronic obstructive pulmonary disease) ([place])   . Family history of breast cancer    Aunt on father's side  . Hyperlipemia   . Hypertension   . MRSA (methicillin resistant staph aureus) culture positive    in breast  . Neuropathy    bilateral toes  . Nipple discharge    with pain, infection, lump  . S/P radiation therapy 01/08/11 - 02/22/11   Right Chest Wall/ 50 Gy / 25 Fractions, Right [place]/ 50 gy/25 Fractions, Right Axillary Boost/500 cGy/25 Fractions, Right Chest Wall Boost/10 Gy/5 Fractions  . Stage III (T4N3) R Br cancer  06/21/2010   Letrozole 2.5 mg which she started on 03/09/2011.  This patient presented to my office on Jun 21, 2010 with a large ulcerated right breast mass and bleeding. She underwent additional workup at that time and a biopsy was done showing invasive breast cancer. She was staged and was found to have a T4  disease that was ER, PR, HER-2 positive. CA 2729 was normal at 31. She is undergoing chemotherapy wi  . Status post chemotherapy    6 cycles of carboplatin and docetaxel with trastuzumab every 21 days followed by surgery  . Tobacco abuse 06/22/2015  . Use of letrozole ([place]) 03/09/2011  . Wears dentures    Past Surgical History:  Procedure Laterality Date  . BREAST SURGERY  2012  . DILATION AND CURETTAGE OF UTERUS    . MASTECTOMY MODIFIED RADICAL Left 12/16/2015   Procedure: MASTECTOMY MODIFIED RADICAL;  Surgeon: Aviva Signs, MD;  Location: [facility];  Service: General;  Laterality: Left;  . port a catheter insertion    . PORT-A-CATH REMOVAL Left 01/15/2013   Procedure: REMOVAL PORT-A-CATH;  Surgeon: Scherry Ran, MD;  Location: [facility];  Service: General;  Laterality: Left;  . PORTACATH PLACEMENT Right 01/16/2016   Procedure: INSERTION PORT-A-CATH;  Surgeon: Aviva Signs, MD;  Location: [facility];  Service: General;  Laterality: Right;  . TUBAL LIGATION       SOCIAL HISTORY:  Social History   Social History  . Marital status: Married    Spouse name: N/A  . Number of children: N/A  . Years of education: N/A   Occupational History  . Not on file.   Social History Main Topics  . Smoking status: Former Smoker  Packs/day: 0.25    Years: 30.00    Types: Cigarettes    Quit date: 12/15/2015  . Smokeless tobacco: Never Used  . Alcohol use No  . Drug use: No  . Sexual activity: Yes    Birth control/ protection: Post-menopausal   Other Topics Concern  . Not on file   Social History Narrative  . No narrative on file    FAMILY HISTORY:  Family History  Problem Relation Age of Onset  . COPD Mother   . Diabetes Father   . Hypertension Father   . COPD Brother   . Cancer Maternal Aunt        ovarian - died of old age  . Cancer Paternal Aunt        had breast ca; died of of a different cancer  . Lung cancer Brother     CURRENT MEDICATIONS:  Outpatient Encounter Prescriptions as of 10/02/2016  Medication Sig  . Ado-Trastuzumab Emtansine (KADCYLA IV) Inject into the vein. Every 3 weeks  . albuterol (PROVENTIL HFA;VENTOLIN HFA) 108 (90 Base) MCG/ACT inhaler Inhale 2 puffs into the lungs every 6 (six) hours as needed for wheezing or shortness of breath.  Marland Kitchen albuterol (PROVENTIL) (2.5 MG/3ML) 0.083% nebulizer solution Inhale 3 mLs into the lungs every 6 (six) hours as needed for wheezing or shortness of breath.  Marland Kitchen CARAFATE 1 GM/10ML suspension TAKE  10 ML BY MOUTH 4 TIMES DAILY WITH MEALS AND AT BEDTIME  . fexofenadine (ALLEGRA) 180 MG tablet Take 180 mg by mouth daily.    . fluconazole (DIFLUCAN) 100 MG tablet Take 1 tablet (100 mg total) by mouth daily.  . furosemide (LASIX) 40 MG tablet TAKE ONE TABLET BY  MOUTH ONCE DAILY  . Ginkgo Biloba 120 MG TABS Take 120 mg by mouth daily.  Marland Kitchen lidocaine-prilocaine (EMLA) cream Apply a quarter size amount to affected area 1 hour prior to coming to chemotherapy.  . loperamide (IMODIUM A-D) 2 MG tablet Days 1-14 take 4 mg 3 times a day  Days 15-56 take 4 mg 2 times a day  Days 57-365 take 4 mg as needed (max 16 mg/day)  . LORazepam (ATIVAN) 0.5 MG tablet Take 1 tablet (0.5 mg total) by mouth every 8 (eight) hours.  Marland Kitchen losartan (COZAAR) 100 MG tablet Take 0.5 tablets (50 mg total) by mouth daily.  . magic mouthwash w/lidocaine SOLN Take 5 mLs by mouth 4 (four) times daily as needed for mouth pain.  . Multiple Vitamin (MULTIVITAMIN) capsule Take 1 capsule by mouth daily.    . Multiple Vitamins-Minerals (EMERGEN-C IMMUNE PO) Take 1 tablet by mouth daily.  . naproxen sodium (ANAPROX) 220 MG tablet Take 220 mg by mouth 2 (two) times daily as needed (pain). pain  . omeprazole (PRILOSEC OTC) 20 MG tablet Take 40 mg by mouth daily.  . ondansetron (ZOFRAN ODT) 4 MG disintegrating tablet Take 1 tablet (4 mg total) by mouth every 8 (eight) hours as needed for nausea or vomiting.  . ondansetron (ZOFRAN) 8 MG tablet Take 1 tablet (8 mg total) by mouth every 8 (eight) hours as needed for nausea or vomiting.  . potassium chloride SA (K-DUR,KLOR-CON) 20 MEQ tablet Take 1 tablet (20 mEq total) by mouth 2 (two) times daily.  . prochlorperazine (COMPAZINE) 10 MG tablet Take 1 tablet (10 mg total) by mouth every 6 (six) hours as needed for nausea or vomiting.  . rosuvastatin (CRESTOR) 20 MG tablet Take 1 tablet (20 mg total) by mouth daily.  Marland Kitchen  simethicone (MYLICON) 80 MG chewable tablet Chew 2 tablets (160 mg total) by mouth 4 (four) times daily as needed for flatulence.  . SYMBICORT 160-4.5 MCG/ACT inhaler INHALE TWO PUFFS BY MOUTH TWICE DAILY  . Vitamin D, Ergocalciferol, (DRISDOL) 50000 units CAPS capsule Take 1 capsule (50,000 Units total) by mouth every 7 (seven) days.    Facility-Administered Encounter Medications as of 10/02/2016  Medication  . heparin lock flush 100 unit/mL  . sodium chloride flush (NS) 0.9 % injection 10 mL    ALLERGIES:  No Known Allergies   PHYSICAL EXAM:  ECOG Performance status: 1 - Symptomatic, but independent.      Physical Exam  Constitutional: She is oriented to person, place, and time and well-developed, well-nourished, and in no distress.  HENT:  Head: Normocephalic.  Mouth/Throat: Oropharynx is clear and moist. No oropharyngeal exudate.  Eyes: Pupils are equal, round, and reactive to light. Conjunctivae are normal. No scleral icterus.  Neck: Normal range of motion. Neck supple.  Cardiovascular: Normal rate, regular rhythm and normal heart sounds.   Pulmonary/Chest: Effort normal. No respiratory distress. She has no wheezes.  Diminished breath sounds  Abdominal: Soft. Bowel sounds are normal. There is no tenderness.  Musculoskeletal: Normal range of motion. She exhibits no edema.  Lymphadenopathy:    She has no cervical adenopathy.       Right: No supraclavicular adenopathy present.       Left: No supraclavicular adenopathy present.  Neurological: She is alert and oriented to person, place, and time. No cranial nerve deficit. Gait normal.  Skin: Skin is warm and dry. No rash noted.  Psychiatric: Mood, memory, affect and judgment normal.  Nursing note and vitals reviewed.    LABORATORY DATA:  I have reviewed the labs as listed.  CBC    Component Value Date/Time   WBC 9.8 09/06/2016 1412   RBC 3.84 (L) 09/06/2016 1412   HGB 10.5 (L) 09/06/2016 1412   HGB 13.5 12/27/2015 1553   HGB 10.1 (L) 11/07/2010 0917   HCT 34.8 (L) 09/06/2016 1412   HCT 41.3 12/27/2015 1553   HCT 31.1 (L) 11/07/2010 0917   PLT 326 09/06/2016 1412   PLT 406 (H) 12/27/2015 1553   MCV 90.6 09/06/2016 1412   MCV 92 12/27/2015 1553   MCV 100.3 11/07/2010 0917   MCH 27.3 09/06/2016 1412   MCHC 30.2 09/06/2016 1412   RDW 16.0 (H)  09/06/2016 1412   RDW 14.8 12/27/2015 1553   RDW 18.3 (H) 11/07/2010 0917   LYMPHSABS 1.1 09/06/2016 1412   LYMPHSABS 1.9 12/27/2015 1553   LYMPHSABS 1.6 11/07/2010 0917   MONOABS 0.9 09/06/2016 1412   MONOABS 1.4 (H) 11/07/2010 0917   EOSABS 0.5 09/06/2016 1412   EOSABS 0.2 12/27/2015 1553   BASOSABS 0.0 09/06/2016 1412   BASOSABS 0.0 12/27/2015 1553   BASOSABS 0.0 11/07/2010 0917   CMP Latest Ref Rng & Units 09/06/2016 08/09/2016 06/26/2016  Glucose 65 - 99 mg/dL 132(H) 115(H) 126(H)  BUN 6 - 20 mg/dL 12 12 17   Creatinine 0.44 - 1.00 mg/dL 0.56 0.48 0.75  Sodium 135 - 145 mmol/L 139 136 138  Potassium 3.5 - 5.1 mmol/L 3.6 3.7 3.5  Chloride 101 - 111 mmol/L 96(L) 93(L) 96(L)  CO2 22 - 32 mmol/L 35(H) 33(H) 29  Calcium 8.9 - 10.3 mg/dL 9.1 8.6(L) 9.6  Total Protein 6.5 - 8.1 g/dL 7.1 6.5 6.5  Total Bilirubin 0.3 - 1.2 mg/dL 0.4 0.4 0.3  Alkaline Phos 38 - 126 U/L  77 73 95  AST 15 - 41 U/L 19 21 18   ALT 14 - 54 U/L 12(L) 14 10(L)      PENDING LABS:     DIAGNOSTIC IMAGING:  Most recent PET scan: 09/03/16 CLINICAL DATA:  Subsequent treatment strategy for breast cancer.  EXAM: NUCLEAR MEDICINE PET SKULL BASE TO THIGH  TECHNIQUE: 7.3 mCi F-18 FDG was injected intravenously. Full-ring PET imaging was performed from the skull base to thigh after the radiotracer. CT data was obtained and used for attenuation correction and anatomic localization.  FASTING BLOOD GLUCOSE:  Value: 96 mg/dl  COMPARISON:  06/04/2016  FINDINGS: NECK: No hypermetabolic lymph nodes in the neck. Low level uptake is identified in the anterior muscles of the neck bilaterally in a symmetric fashion.  CHEST: Interval development of small to moderate right pleural effusion associated with a linear band of hypermetabolism in the posterior right costophrenic sulcus ( SUV max = 5.7) . Imaging features are concerning for recurrent posterior right pleural disease. The uptake is at the level of the  posterior right eleventh and twelfth ribs. Sclerotic deformity in the right ninth rib is stable.  Similar CT appearance to surgical change in the left breast with stable to minimally progressed FDG uptake ( SUV max = 4.1) .  Emphysema. Pleural-parenchymal scarring anterior right lung likely radiation fibrosis. Right Port-A-Cath tip is positioned in the mid to distal SVC.  ABDOMEN/PELVIS: No abnormal hypermetabolic activity within the liver, pancreas, adrenal glands, or spleen. No hypermetabolic lymph nodes in the abdomen or pelvis.  Nodular area of FDG accumulation identified to the right of the bladder, similar to prior and likely representing a collapsed portion of the nondistended bladder posterolaterally on the right.  Date focal area of decreased attenuation in the right liver is stable and again shows no associated hypermetabolism.  SKELETON: Scattered lytic and sclerotic osseous metastatic lesions are identified diffusely without definite hypermetabolism above blood pool activity.  IMPRESSION: 1. The most concerning finding on today's study is the right posterior costophrenic sulcus hypermetabolism within new small to moderate right pleural effusion. Imaging features raise concern for pleural involvement in the right hemithorax. 2. Fairly symmetric muscular uptake in anterior neck is likely related to patient motion after FDG injection. 3. Otherwise stable exam. 4.  Emphysema. (ICD10-J43.9) 5.  Aortic Atherosclerois (ICD10-170.0)   Electronically Signed   By: Misty Stanley M.D.   On: 09/03/2016 13:31    Most recent ECHO: 09/11/16   *[place],  [id number]                            [phone]  ------------------------------------------------------------------- Transthoracic Echocardiography  Patient:    Sidda, Humm MR #:       [id number] Study Date:  09/11/2016 Gender:     F Age:        61 Height:     163.8 cm Weight:     64.8 kg BSA:        1.73 m^2 Pt. Status: Room:   SONOGRAPHER  Leavy Cella  PERFORMING   [facility], [place]    [place], [place] A  ATTENDING  Twana First  ORDERING     Zhou, Louise  Annell Greening  cc:  ------------------------------------------------------------------- LV EF: 55%  ------------------------------------------------------------------- History:   PMH:  Mastectomy. Shortness of Breath, Former Smoker, Breast carcinoma. Bone Metastases. Baseline ECHO prior to starting kadcyla.  Chronic obstructive pulmonary disease.  Risk factors: Hypertension. Dyslipidemia.  ------------------------------------------------------------------- Study Conclusions  - Left ventricle: The cavity size was normal. Systolic function was   normal. The estimated ejection fraction was 55%. Wall motion was   normal; there were no regional wall motion abnormalities. Left   ventricular diastolic function parameters were normal. - Right ventricle: The cavity size was mildly dilated. Wall   thickness was normal. - Atrial septum: No defect or patent foramen ovale was identified. - Impressions: Normal GLS -16.8.  Impressions:  - Normal GLS -16.8.     PATHOLOGY:  (L) breast mastectomy surgical path: 12/16/15        Liver biopsy: 02/10/16              ASSESSMENT & PLAN:   Stage IV invasive ductal carcinoma breast cancer with liver and bone mets, ER+/PR+/HER2+:  -Significant history of breast cancer.  (per Kirby Crigler, PA-C's last note)   -Received 5 cycles of Taxotere/Herceptin/Perjeta (Taxotere dose-reduced from cycle #2 and forward) from 03/06/16-06/05/16; and received 1 additional dose of Taxotere on 06/26/16.  Started on maintenance Nerlynx 07/24/16.  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  Therapy switched to Kadcyla on 09/11/16.  -Due for cycle #2 Kadcyla today; labs reviewed and adequate for treatment.  -Last ECHO 09/11/16 and normal with EF 55%.     ***  Hypocalcemia:  -Calcium serially low; could be secondary to hyperparathyroidism or hyperphosphatemia. PTH pending; phosphorus was actually low (see below).  Hypocalcemia differential diagnoses considered: hypoalbuminemia (albumin normal), hyperparathyroidism (PTH pending), chronic kidney disease (creatinine and EGFR normal), hyperphosphatemia (serum phos actually low), magnesium depletion (magnesium collected and low today), medications (she is not on calcium chelating drugs), bisphosphonates (has not received Xgeva since 03/06/16 d/t low calcium), vitamin D deficiency (will check with labs next week) or osteoblastic metastases (worsening bone mets could be be contributing to hypocalcemia, although we generally see hypercalcemia with this; PET scan upcoming).   -Serum calcium low 7.7 today.  She does have mild paresthesias to her hands/feet.  -We have been unable to give Xgeva for bone mets given low calcium.  -Discussed with Dr. Talbert Cage. We will replete with 2 gm IV calcium gluconate today. -Will check PTH, magnesium, phosphorus today as well.    Bone mets:  -Several doses held in the past d/t hypocalcemia. Found to have vitamin D deficiency; vitamin D replaced and calcium improved.  -Continue Xgeva monthly to help reduce risk of skeletal-related events.  -Last dose:  Oncology Flowsheet 09/06/2016  denosumab (XGEVA) [place] 120 mg    ***  Hypomagnesemia/Hypophosphatemia:  -Magnesium and phosphorus labs added on to labs today given continued hypocalcemia. Both magnesium and phosphorus found to be low.  -Will replete magnesium with 2 gm IV Mag Sulfate during chemotherapy infusion today.  -Will give 1 dose of 500 mg K-Phos oral supplementation for low phos.     Dispo:  -Return to cancer center next week  to re-challenge Xgeva injection (if serum calcium adequate to be given). Will check serum vitamin D level at that time as well; if deficient, could be contributing to hypocalcemia despite oral calcium supplementation.  -  PET scan in 2 weeks before next cycle of chemotherapy.  -Return to cancer center in 3 weeks to meet with Dr. Talbert Cage to review PET scan results and discuss further treatment plan.     All questions were answered to patient's stated satisfaction. Encouraged patient to call with any new concerns or questions before her next visit to the cancer center and we can certain see her sooner, if needed.    Plan of care discussed with Dr. Talbert Cage, who agrees with the above aforementioned.    Orders placed this encounter:  No orders of the defined types were placed in this encounter.     Mike Craze, NP [place] [phone]

## 2016-10-03 ENCOUNTER — Other Ambulatory Visit: Payer: Self-pay | Admitting: Family

## 2016-10-03 LAB — CANCER ANTIGEN 15-3: CAN 15 3: 20.5 U/mL (ref 0.0–25.0)

## 2016-10-03 LAB — CANCER ANTIGEN 27.29: CAN 27.29: 20.7 U/mL (ref 0.0–38.6)

## 2016-10-04 ENCOUNTER — Other Ambulatory Visit (HOSPITAL_COMMUNITY): Payer: PRIVATE HEALTH INSURANCE

## 2016-10-04 ENCOUNTER — Encounter (HOSPITAL_COMMUNITY): Payer: PRIVATE HEALTH INSURANCE | Attending: Adult Health

## 2016-10-04 ENCOUNTER — Encounter (HOSPITAL_COMMUNITY): Payer: Self-pay

## 2016-10-04 VITALS — BP 124/70 | HR 105 | Temp 98.1°F | Resp 22

## 2016-10-04 DIAGNOSIS — C50912 Malignant neoplasm of unspecified site of left female breast: Secondary | ICD-10-CM | POA: Diagnosis not present

## 2016-10-04 DIAGNOSIS — C7951 Secondary malignant neoplasm of bone: Secondary | ICD-10-CM

## 2016-10-04 DIAGNOSIS — C787 Secondary malignant neoplasm of liver and intrahepatic bile duct: Secondary | ICD-10-CM | POA: Diagnosis not present

## 2016-10-04 MED ORDER — DENOSUMAB 120 MG/1.7ML ~~LOC~~ SOLN
120.0000 mg | Freq: Once | SUBCUTANEOUS | Status: AC
  Administered 2016-10-04: 120 mg via SUBCUTANEOUS
  Filled 2016-10-04: qty 1.7

## 2016-10-04 NOTE — Progress Notes (Signed)
Sheila Garrison tolerated Xgeva injections well without complaints or incident. Calcium 9.2 on 10/02/16. Pt denies any tooth,jaw or leg pain and no recent or future dental visits reported. VSS Pt discharged self ambulatory in satisfactory condition accompanied by a friend

## 2016-10-04 NOTE — Patient Instructions (Signed)
[facility] at [facility] Discharge Instructions  RECOMMENDATIONS MADE BY THE CONSULTANT AND ANY TEST RESULTS WILL BE SENT TO YOUR REFERRING PHYSICIAN.  Received Xgeva injection today. Follow-up as scheduled. Call clinic for any questions or concerns  Thank you for choosing [facility] at [facility] to provide your oncology and hematology care.  To afford each patient quality time with our provider, please arrive at least 15 minutes before your scheduled appointment time.    If you have a lab appointment with the Cancer Center please come in thru the  Main Entrance and check in at the main information desk  You need to re-schedule your appointment should you arrive 10 or more minutes late.  We strive to give you quality time with our providers, and arriving late affects you and other patients whose appointments are after yours.  Also, if you no show three or more times for appointments you may be dismissed from the clinic at the providers discretion.     Again, thank you for choosing [facility].  Our hope is that these requests will decrease the amount of time that you wait before being seen by our physicians.       _____________________________________________________________  Should you have questions after your visit to [facility], please contact our office at [phone] between the hours of 8:30 a.m. and 4:30 p.m.  Voicemails left after 4:30 p.m. will not be returned until the following business day.  For prescription refill requests, have your pharmacy contact our office.       Resources For Cancer Patients and their Caregivers ? American Cancer Society: Can assist with transportation, wigs, general needs, runs Look Good Feel Better.        1-888-227-6333 ? Cancer Care: Provides financial assistance, online support groups, medication/co-pay assistance.  1-800-813-HOPE (4673) ? [facility] Assists [place] cancer patients and their families through emotional , educational and financial support.  336-427-4357 ? [place] DSS Where to apply for food stamps, Medicaid and utility assistance. 336-342-1394 ? [facility]: Transportation to medical appointments. 336-347-2287 ? Social Security Administration: May apply for disability if have a Stage IV cancer. 336-342-7796 1-800-772-1213 ? [place] Aging, Disability and Transit Services: Assists with nutrition, care and transit needs. 336-349-2343  Cancer Center Support Programs: [email or web address]@ > Cancer Support Group  2nd Tuesday of the month 1pm-2pm, Journey Room  > Creative Journey  3rd Tuesday of the month 1130am-1pm, Journey Room  > Look Good Feel Better  1st Wednesday of the month 10am-12 noon, Journey Room (Call American Cancer Society to register 1-800-395-5775)   

## 2016-10-19 ENCOUNTER — Ambulatory Visit (INDEPENDENT_AMBULATORY_CARE_PROVIDER_SITE_OTHER): Payer: PRIVATE HEALTH INSURANCE | Admitting: Family

## 2016-10-19 ENCOUNTER — Other Ambulatory Visit: Payer: Self-pay | Admitting: Physician Assistant

## 2016-10-19 ENCOUNTER — Encounter: Payer: Self-pay | Admitting: Family

## 2016-10-19 VITALS — BP 147/98 | HR 88 | Temp 97.9°F | Ht 64.5 in | Wt 146.8 lb

## 2016-10-19 DIAGNOSIS — E784 Other hyperlipidemia: Secondary | ICD-10-CM | POA: Diagnosis not present

## 2016-10-19 DIAGNOSIS — Z170 Estrogen receptor positive status [ER+]: Secondary | ICD-10-CM | POA: Diagnosis not present

## 2016-10-19 DIAGNOSIS — E7849 Other hyperlipidemia: Secondary | ICD-10-CM

## 2016-10-19 DIAGNOSIS — I10 Essential (primary) hypertension: Secondary | ICD-10-CM | POA: Diagnosis not present

## 2016-10-19 DIAGNOSIS — C50112 Malignant neoplasm of central portion of left female breast: Secondary | ICD-10-CM

## 2016-10-19 DIAGNOSIS — E876 Hypokalemia: Secondary | ICD-10-CM

## 2016-10-19 DIAGNOSIS — K219 Gastro-esophageal reflux disease without esophagitis: Secondary | ICD-10-CM

## 2016-10-19 DIAGNOSIS — J439 Emphysema, unspecified: Secondary | ICD-10-CM | POA: Diagnosis not present

## 2016-10-19 DIAGNOSIS — F411 Generalized anxiety disorder: Secondary | ICD-10-CM

## 2016-10-19 LAB — CMP14+EGFR
ALBUMIN: 4.2 g/dL (ref 3.6–4.8)
ALK PHOS: 89 IU/L (ref 39–117)
ALT: 31 IU/L (ref 0–32)
AST: 36 IU/L (ref 0–40)
Albumin/Globulin Ratio: 1.7 (ref 1.2–2.2)
BUN / CREAT RATIO: 23 (ref 12–28)
BUN: 14 mg/dL (ref 8–27)
CHLORIDE: 92 mmol/L — AB (ref 96–106)
CO2: 31 mmol/L — ABNORMAL HIGH (ref 20–29)
Calcium: 8.7 mg/dL (ref 8.7–10.3)
Creatinine, Ser: 0.61 mg/dL (ref 0.57–1.00)
GFR calc Af Amer: 112 mL/min/{1.73_m2} (ref >59)
GFR calc non Af Amer: 97 mL/min/{1.73_m2} (ref >59)
GLUCOSE: 102 mg/dL — AB (ref 65–99)
Globulin, Total: 2.5 g/dL (ref 1.5–4.5)
Potassium: 3.8 mmol/L (ref 3.5–5.2)
Sodium: 138 mmol/L (ref 134–144)
Total Protein: 6.7 g/dL (ref 6.0–8.5)

## 2016-10-19 LAB — LIPID PANEL
CHOLESTEROL TOTAL: 150 mg/dL (ref 100–199)
Chol/HDL Ratio: 2.3 ratio (ref 0.0–4.4)
HDL: 65 mg/dL (ref >39)
LDL Calculated: 56 mg/dL (ref 0–99)
Triglycerides: 143 mg/dL (ref 0–149)
VLDL CHOLESTEROL CAL: 29 mg/dL (ref 5–40)

## 2016-10-19 NOTE — Progress Notes (Signed)
Subjective:    Patient ID: Sheila Garrison, female    DOB: Jul 29, 1955, 61 y.o.   MRN: [id number]  PT presents to the office today for chronic follow up. 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  Hypertension  This is a chronic problem. The current episode started more than 1 year ago. The problem has been waxing and waning since onset. The problem is controlled. Associated symptoms include anxiety, malaise/fatigue and shortness of breath. Pertinent negatives include no peripheral edema. Risk factors for coronary artery disease include dyslipidemia, obesity, post-menopausal state, sedentary lifestyle and family history. There is no history of CAD/MI, CVA or heart failure.  Hyperlipidemia  This is a chronic problem. The current episode started more than 1 year ago. The problem is controlled. Recent lipid tests were reviewed and are normal. Associated symptoms include shortness of breath. Current antihyperlipidemic treatment includes statins. The current treatment provides moderate improvement of lipids. Risk factors for coronary artery disease include dyslipidemia, family history, post-menopausal and a sedentary lifestyle.  Gastroesophageal Reflux  She reports no belching or no heartburn. This is a chronic problem. The current episode started more than 1 year ago. The problem occurs frequently. She has tried a PPI for the symptoms. The treatment provided moderate relief.  Anxiety  Presents for follow-up visit. Symptoms include excessive worry, irritability, nervous/anxious behavior and shortness of breath. Symptoms occur most days.     COPD Pt quit smoking. Taking Symbicort BID and using albuterol as needed.    Review of Systems  Constitutional: Positive for irritability and malaise/fatigue.  Respiratory: Positive for shortness of breath.   Gastrointestinal: Negative for heartburn.  Psychiatric/Behavioral: The patient is  nervous/anxious.   All other systems reviewed and are negative.      Objective:   Physical Exam  Constitutional: She is oriented to person, place, and time. She appears well-developed and well-nourished. No distress.  HENT:  Head: Normocephalic and atraumatic.  Right Ear: External ear normal.  Left Ear: External ear normal.  Nose: Nose normal.  Mouth/Throat: Oropharynx is clear and moist.  Eyes: Pupils are equal, round, and reactive to light.  Neck: Normal range of motion. Neck supple. No thyromegaly present.  Cardiovascular: Normal rate, regular rhythm, normal heart sounds and intact distal pulses.   No murmur heard. Pulmonary/Chest: Effort normal. No respiratory distress. She has decreased breath sounds. She has no wheezes.  4 L O2  Abdominal: Soft. Bowel sounds are normal. She exhibits no distension. There is no tenderness.  Musculoskeletal: Normal range of motion. She exhibits no edema or tenderness.  Generalized weakness  Neurological: She is alert and oriented to person, place, and time.  Skin: Skin is warm and dry. There is pallor.  Psychiatric: She has a normal mood and affect. Her behavior is normal. Judgment and thought content normal.  Vitals reviewed.   BP (!) 147/98   Pulse 88   Temp 97.9 F (36.6 C) (Oral)   Ht 5' 4.5" (1.638 m)   Wt 146 lb 12.8 oz (66.6 kg)   LMP 03/08/2010   BMI 24.81 kg/m      Assessment & Plan:  1. Essential hypertension - CMP14+EGFR  2. Pulmonary emphysema, unspecified emphysema type ([place]) - CMP14+EGFR  3. Gastroesophageal reflux disease, esophagitis presence not specified - CMP14+EGFR  4. Malignant neoplasm of central portion of left breast in female, estrogen receptor positive (HCC) - CMP14+EGFR  5. Other hyperlipidemia We will stop Crestor today Low fat  diet - CMP14+EGFR - Lipid panel  6. GAD (generalized anxiety disorder) - CMP14+EGFR   Continue all meds and keep Oncologists appts!! Labs pending Health  Maintenance reviewed- We will give Prevnar when we flu vaccine later this month   Diet and exercise encouraged RTO 6 months   [name], FNP

## 2016-10-19 NOTE — Patient Instructions (Signed)

## 2016-10-23 ENCOUNTER — Encounter (HOSPITAL_BASED_OUTPATIENT_CLINIC_OR_DEPARTMENT_OTHER): Payer: PRIVATE HEALTH INSURANCE | Admitting: Oncology

## 2016-10-23 ENCOUNTER — Encounter (HOSPITAL_COMMUNITY): Payer: PRIVATE HEALTH INSURANCE | Attending: Hematology & Oncology

## 2016-10-23 ENCOUNTER — Encounter (HOSPITAL_COMMUNITY): Payer: Self-pay

## 2016-10-23 VITALS — BP 118/73 | HR 88 | Temp 98.2°F | Resp 18

## 2016-10-23 DIAGNOSIS — C787 Secondary malignant neoplasm of liver and intrahepatic bile duct: Secondary | ICD-10-CM

## 2016-10-23 DIAGNOSIS — Z5112 Encounter for antineoplastic immunotherapy: Secondary | ICD-10-CM

## 2016-10-23 DIAGNOSIS — C50919 Malignant neoplasm of unspecified site of unspecified female breast: Secondary | ICD-10-CM | POA: Diagnosis present

## 2016-10-23 DIAGNOSIS — C7951 Secondary malignant neoplasm of bone: Secondary | ICD-10-CM

## 2016-10-23 DIAGNOSIS — C50912 Malignant neoplasm of unspecified site of left female breast: Secondary | ICD-10-CM

## 2016-10-23 DIAGNOSIS — C50111 Malignant neoplasm of central portion of right female breast: Secondary | ICD-10-CM

## 2016-10-23 LAB — COMPREHENSIVE METABOLIC PANEL
ALBUMIN: 3.6 g/dL (ref 3.5–5.0)
ALT: 24 U/L (ref 14–54)
ANION GAP: 9 (ref 5–15)
AST: 27 U/L (ref 15–41)
Alkaline Phosphatase: 74 U/L (ref 38–126)
BUN: 17 mg/dL (ref 6–20)
CO2: 37 mmol/L — ABNORMAL HIGH (ref 22–32)
Calcium: 9.9 mg/dL (ref 8.9–10.3)
Chloride: 92 mmol/L — ABNORMAL LOW (ref 101–111)
Creatinine, Ser: 0.58 mg/dL (ref 0.44–1.00)
GFR calc Af Amer: >60 mL/min (ref >60)
GFR calc non Af Amer: >60 mL/min (ref >60)
GLUCOSE: 115 mg/dL — AB (ref 65–99)
POTASSIUM: 3.6 mmol/L (ref 3.5–5.1)
Sodium: 138 mmol/L (ref 135–145)
TOTAL PROTEIN: 7.2 g/dL (ref 6.5–8.1)
Total Bilirubin: 0.3 mg/dL (ref 0.3–1.2)

## 2016-10-23 LAB — CBC WITH DIFFERENTIAL/PLATELET
BASOS ABS: 0 10*3/uL (ref 0.0–0.1)
BASOS PCT: 0 %
EOS PCT: 2 %
Eosinophils Absolute: 0.2 10*3/uL (ref 0.0–0.7)
HCT: 36.7 % (ref 36.0–46.0)
Hemoglobin: 11.3 g/dL — ABNORMAL LOW (ref 12.0–15.0)
Lymphocytes Relative: 14 %
Lymphs Abs: 1.6 10*3/uL (ref 0.7–4.0)
MCH: 27.2 pg (ref 26.0–34.0)
MCHC: 30.8 g/dL (ref 30.0–36.0)
MCV: 88.4 fL (ref 78.0–100.0)
MONO ABS: 1.3 10*3/uL — AB (ref 0.1–1.0)
Monocytes Relative: 12 %
Neutro Abs: 7.8 10*3/uL — ABNORMAL HIGH (ref 1.7–7.7)
Neutrophils Relative %: 72 %
PLATELETS: 272 10*3/uL (ref 150–400)
RBC: 4.15 MIL/uL (ref 3.87–5.11)
RDW: 16.1 % — AB (ref 11.5–15.5)
WBC: 10.9 10*3/uL — ABNORMAL HIGH (ref 4.0–10.5)

## 2016-10-23 MED ORDER — DIPHENHYDRAMINE HCL 25 MG PO CAPS: 50.0000 mg | ORAL_CAPSULE | Freq: Once | ORAL | Status: DC

## 2016-10-23 MED ORDER — ACETAMINOPHEN 325 MG PO TABS: 650.0000 mg | ORAL_TABLET | Freq: Once | ORAL | Status: DC

## 2016-10-23 MED ORDER — SODIUM CHLORIDE 0.9 % IV SOLN
3.9000 mg/kg | Freq: Once | INTRAVENOUS | Status: AC
  Administered 2016-10-23: 260 mg via INTRAVENOUS
  Filled 2016-10-23: qty 8

## 2016-10-23 MED ORDER — DIPHENHYDRAMINE HCL 25 MG PO CAPS
ORAL_CAPSULE | ORAL | Status: AC
  Filled 2016-10-23: qty 2

## 2016-10-23 MED ORDER — ACETAMINOPHEN 325 MG PO TABS
ORAL_TABLET | ORAL | Status: AC
  Filled 2016-10-23: qty 2

## 2016-10-23 MED ORDER — HEPARIN SOD (PORK) LOCK FLUSH 100 UNIT/ML IV SOLN
500.0000 [IU] | Freq: Once | INTRAVENOUS | Status: AC | PRN
  Administered 2016-10-23: 500 [IU]
  Filled 2016-10-23: qty 5

## 2016-10-23 MED ORDER — SODIUM CHLORIDE 0.9 % IV SOLN
Freq: Once | INTRAVENOUS | Status: AC
  Administered 2016-10-23: 12:00:00 via INTRAVENOUS

## 2016-10-23 NOTE — Progress Notes (Deleted)
1215 VSS. See vital sign flowsheet. Rate increased to 276ml/hr. Tolerating infusion well.

## 2016-10-23 NOTE — Progress Notes (Signed)
Tolerated infusion w/o adverse reaction.  Alert, in no distress.  VSS.  Discharged via wheelchair in c/o family.  

## 2016-10-23 NOTE — Patient Instructions (Signed)
[facility] at [facility] Discharge Instructions  RECOMMENDATIONS MADE BY THE CONSULTANT AND ANY TEST RESULTS WILL BE SENT TO YOUR REFERRING PHYSICIAN.  You saw Dr. Zhou today.  Thank you for choosing  [facility] Center at [facility] to provide your oncology and hematology care.  To afford each patient quality time with our provider, please arrive at least 15 minutes before your scheduled appointment time.    If you have a lab appointment with the Cancer Center please come in thru the  Main Entrance and check in at the main information desk  You need to re-schedule your appointment should you arrive 10 or more minutes late.  We strive to give you quality time with our providers, and arriving late affects you and other patients whose appointments are after yours.  Also, if you no show three or more times for appointments you may be dismissed from the clinic at the providers discretion.     Again, thank you for choosing [facility].  Our hope is that these requests will decrease the amount of time that you wait before being seen by our physicians.       _____________________________________________________________  Should you have questions after your visit to [facility], please contact our office at [phone] between the hours of 8:30 a.m. and 4:30 p.m.  Voicemails left after 4:30 p.m. will not be returned until the following business day.  For prescription refill requests, have your pharmacy contact our office.       Resources For Cancer Patients and their Caregivers ? American Cancer Society: Can assist with transportation, wigs, general needs, runs Look Good Feel Better.        1-888-227-6333 ? Cancer Care: Provides financial assistance, online support groups, medication/co-pay assistance.  1-800-813-HOPE (4673) ? [facility] Assists [place] cancer patients and their families through  emotional , educational and financial support.  336-427-4357 ? [place] DSS Where to apply for food stamps, Medicaid and utility assistance. 336-342-1394 ? [facility]: Transportation to medical appointments. 336-347-2287 ? Social Security Administration: May apply for disability if have a Stage IV cancer. 336-342-7796 1-800-772-1213 ? [place] Aging, Disability and Transit Services: Assists with nutrition, care and transit needs. 336-349-2343  Cancer Center Support Programs: [email or web address]@ > Cancer Support Group  2nd Tuesday of the month 1pm-2pm, Journey Room  > Creative Journey  3rd Tuesday of the month 1130am-1pm, Journey Room  > Look Good Feel Better  1st Wednesday of the month 10am-12 noon, Journey Room (Call American Cancer Society to register 1-800-395-5775)    

## 2016-10-23 NOTE — Progress Notes (Signed)
Sheila Garrison, [place] [address]  Progress Note  Breast Cancer  CURRENT THERAPY: Docetaxel/Herceptin/Perjeta beginning on 03/06/2016.  Docetaxel dose reduced by 20% beginning on 03/27/2016.  INTERVAL HISTORY: Sheila Garrison 61 y.o. 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 AND Invasive lobular left breast cancer, Stage IIB (pT3pN2A), S/P left mastectomy by Dr. Arnoldo Morale on 12/16/2015, ER/PR+ 2%.  HER2 NEGATIVE.  Done prior to knowing about Stage IV disease. 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 Margot Chimes, systemic chemotherapy by Dr. Jana Hakim in 2012 and Herceptin x 52 weeks finishing on 07/10/2011, XRT (01/04/2011- 02/22/2011), and anti-endocrine therapy.    Stage III (T4N3) R Br cancer    06/21/2010 Initial Diagnosis    Stage III (T4N3) R Br cancer       01/04/2011 - 02/22/2011 Radiation Therapy    50 Gy       05/07/2014 Pathology Results    BCI testing- low risk of late recurrence (3.4% between years 5-10), high likelihood of benefit from extended endocrein therapy, and a 67% relative risk reduction when treated with extended endocrine therapy (27% versus 10.5%).       [name], stage 4, left ([place])   11/22/2015 Mammogram    Ultrasound-guided biopsy of the irregular hypoechoic area, with prominent shadowing, in the OUTER left breast at the 1 o'clock axis. 2. Ultrasound-guided biopsy of the irregular mass within the INNER left breast at the 9 o'clock axis. 3. Ultrasound-guided biopsy of 1 of the enlarged and amorphous lymph nodes in the left axilla.      11/22/2015 Imaging    Ultrasound-guided biopsy of the irregular hypoechoic area, with prominent shadowing, in the OUTER left breast at the 1 o'clock axis. 2. Ultrasound-guided biopsy of  the irregular mass within the INNER left breast at the 9 o'clock axis. 3. Ultrasound-guided biopsy of 1 of the enlarged and amorphous lymph nodes in the left axilla.      12/16/2015 Surgery    L modified radical mastectomy with Dr. Arnoldo Morale      12/18/2015 - 12/21/2015 Hospital Admission    Admit date: 12/18/2015 Admission diagnosis: altered MS, acute respiratory failure Additional comments: This likely was a combination of COPD exacerbation along with mild acute on chronic diastolic CHF with  echo shows a preserved EF of around 60%, he was treated with IV steroids and Lasix, she is much better but qualifies for home oxygen which will be provided      12/19/2015 Imaging    No evidence of pulmonary embolus. 2. Small right pleural effusion, with associated atelectasis. Peripheral scarring at the anterior right upper lobe. Mild bilateral emphysema noted. 3. Scattered coronary artery calcifications seen. 4. Postoperative change at the left chest wall, reflecting recent mastectomy, with vague collections of postoperative fluid seen. 5. Vague sclerotic change and heterogeneity at vertebral body T8 raises concern for sequelae of metastatic disease      01/23/2016 PET scan    1. Widespread multifocal metastatic disease in the visualized axial and appendicular skeleton. 2. Solitary metastatic lesion in the liver. Hypermetabolic irregular left axillary lymph node adjacent to the left axillary clips. 3. Activity in the left breast related to mastectomy. 4. Low-grade activity along the pleuroparenchymal thickening anteriorly in the right chest is probably related to prior therapy rather than current  malignancy. 5. Probable mild compression fracture at T8 associated with the extensive tumor at this level. There is also bony destruction of the right ninth rib posteriorly. 6. Faintly hypermetabolic but small left external iliac lymph nodes merit surveillance  ADDENDUM: The original report  was by Dr. Van Clines. The following addendum is by Dr. Van Clines:  I have one other musculoskeletal focus of hypermetabolic activity to mention. In the right latissimus dorsi muscle, there is a small hypermetabolic focus with maximum SUV of 5.4, measuring 7 mm in diameter on image 89/4, compatible with a metastatic deposit.        02/02/2016 Imaging    MRI T-spine: 1. Extensive metastatic disease throughout the cervical and thoracic spine with a subtle pathologic compression fracture of T8. Tumor extends through the posterior margin of the T8 vertebra into the spinal canal without spinal cord compression at this time. 2. No other pathologic fractures of the thoracic spine at this time.      02/10/2016 Procedure    US biopsy of liver lesion      02/15/2016 Pathology Results    Liver, needle/core biopsy, right lobe METASTATIC CARCINOMA, CONSISTENT WITH BREAST DUCTAL CARCINOMA.      02/15/2016 - 02/28/2016 Chemotherapy    Letrozole 2.5 mg daily + Kisqali (600 mg) days 1-21 every 28 days       02/21/2016 Pathology Results    Breast Prognostic profile from liver biopsy: HER2 POSITIVE, ER/PR NEGATIVE (0%).      02/27/2016 - 03/09/2016 Radiation Therapy    Palliative XRT to T7-T11 with Dr. Lianne Cure; 30 Gy in 10 fractions.       02/28/2016 Treatment Plan Change    Due to HER2 positivity and ER/PR negativity from liver biopsy, change to systemic chemotherapy is recommended.      03/06/2016 -  Chemotherapy    Docetaxel/Herceptin/Perjeta with Neulasta support.      03/11/2016 - 03/15/2016 Hospital Admission    Admit date: 03/11/2016 Admission diagnosis: Febrile neutropenia Additional comments: COPD exacerbation      03/27/2016 Treatment Plan Change    Docetaxel dose reduced by 20%.      03/27/2016 Adverse Reaction    Demonstrating early signs of palmar-plantar erythrodysesthesia.      03/27/2016 Treatment Plan Change    Defer treatment 1 week.      04/10/2016 Imaging     MUGA- The left ventricular ejection fraction is equal to 56.2%. Normal left ventricular wall motion.      06/04/2016 PET scan    1. No residual hypermetabolism within previously hypermetabolic lesions in the left axilla, right latissimus dorsi muscle, anterior right pleural space, liver, left external iliac lymph node and bones. 2. Aortic atherosclerosis (ICD10-170.0). Coronary artery calcification.      07/25/2016 Treatment Plan Change    Started Nerlynx      09/03/2016 PET scan     Interval development of small to moderate right pleural effusion associated with a linear band of hypermetabolism in the posterior right costophrenic sulcus ( SUV max = 5.7) . Imaging features are concerning for recurrent posterior right pleural disease.      09/06/2016 Treatment Plan Change    Stopped Nerlyn due to evidence of recurrent posterior right pleural disease.       09/11/2016 Treatment Plan Change    Started Kadcyla       Sheila Garrison presents to the clinic for continuing follow up with her friend. She is here for cycle 3 of Kadcyla. She has been tolerating  her treatments well. She has her chronic neuropathy which has not worsened. She states her appetite is good and she is gained 2 pounds since her last visit. She denies any worsening of her baseline shortness of breath. She continues to to use 4 L of oxygen via nasal cannula. She denies any abdominal pain, worsening shortness of breath with exertion, nausea, vomiting, diarrhea. She continues to perform all her ADLs without any difficulty.  Review of Systems  Constitutional: Negative for chills, fever, malaise/fatigue and weight loss.  HENT: Negative for hearing loss, sore throat and tinnitus.   Eyes: Negative.  Negative for blurred vision, photophobia and discharge.  Respiratory: Negative for cough, hemoptysis, shortness of breath and wheezing.   Cardiovascular: Negative.  Negative for chest pain, palpitations, orthopnea, claudication and  leg swelling.  Gastrointestinal: Negative for abdominal pain, constipation, diarrhea, melena, nausea and vomiting.  Genitourinary: Negative.  Negative for dysuria and hematuria.  Musculoskeletal: Negative for back pain, joint pain, myalgias and neck pain.  Skin: Negative.  Negative for itching and rash.  Neurological: Negative for dizziness, tingling, weakness and headaches.  Endo/Heme/Allergies: Negative.  Negative for environmental allergies and polydipsia. Does not bruise/bleed easily.  Psychiatric/Behavioral: Negative.  Negative for depression. The patient is not nervous/anxious and does not have insomnia.     Past Medical History:  Diagnosis Date  . Anxiety   . Bone metastases ([place]) 02/15/2016  . Breast CA ([place]) 07/01/2010   rt breast ca  . COPD (chronic obstructive pulmonary disease) ([place])   . Family history of breast cancer    Aunt on father's side  . Hyperlipemia   . Hypertension   . MRSA (methicillin resistant staph aureus) culture positive    in breast  . Neuropathy    bilateral toes  . Nipple discharge    with pain, infection, lump  . S/P radiation therapy 01/08/11 - 02/22/11   Right Chest Wall/ 50 Gy / 25 Fractions, Right  Region/ 50 gy/25 Fractions, Right Axillary Boost/500 cGy/25 Fractions, Right Chest Wall Boost/10 Gy/5 Fractions  . Stage III (T4N3) R Br cancer  06/21/2010   Letrozole 2.5 mg which she started on 03/09/2011.  This patient presented to my office on Jun 21, 2010 with a large ulcerated right breast mass and bleeding. She underwent additional workup at that time and a biopsy was done showing invasive breast cancer. She was staged and was found to have a T4  disease that was ER, PR, HER-2 positive. CA 2729 was normal at 31. She is undergoing chemotherapy wi  . Status post chemotherapy    6 cycles of carboplatin and docetaxel with trastuzumab every 21 days followed by surgery  . Tobacco abuse 06/22/2015  . Use of letrozole ([place]) 03/09/2011  . Wears dentures       Past Surgical History:  Procedure Laterality Date  . BREAST SURGERY  2012  . DILATION AND CURETTAGE OF UTERUS    . MASTECTOMY MODIFIED RADICAL Left 12/16/2015   Procedure: MASTECTOMY MODIFIED RADICAL;  Surgeon: Aviva Signs, MD;  Location: [facility];  Service: General;  Laterality: Left;  . port a catheter insertion    . PORT-A-CATH REMOVAL Left 01/15/2013   Procedure: REMOVAL PORT-A-CATH;  Surgeon: Scherry Ran, MD;  Location: [facility];  Service: General;  Laterality: Left;  . PORTACATH PLACEMENT Right 01/16/2016   Procedure: INSERTION PORT-A-CATH;  Surgeon: Aviva Signs, MD;  Location: [facility];  Service: General;  Laterality: Right;  . TUBAL LIGATION      Family History  Problem Relation Age of Onset  . COPD Mother   . Diabetes Father   . Hypertension Father   . COPD Brother   . Cancer Maternal Aunt        ovarian - died of old age  . Cancer Paternal Aunt        had breast ca; died of of a different cancer  . Lung cancer Brother     Social History   Social History  . Marital status: Married    Spouse name: N/A  . Number of children: N/A  . Years of education: N/A   Social History Main Topics  . Smoking status: Former Smoker    Packs/day: 0.25    Years: 30.00    Types: Cigarettes    Quit date: 12/15/2015  . Smokeless tobacco: Never Used  . Alcohol use No  . Drug use: No  . Sexual activity: Yes    Birth control/ protection: Post-menopausal   Other Topics Concern  . None   Social History Narrative  . None     PHYSICAL EXAMINATION  ECOG PERFORMANCE STATUS: 1 - Symptomatic but completely ambulatory   Physical Exam  Constitutional: She is oriented to person, place, and time and well-developed, well-nourished, and in no distress. No distress.  HENT:  Head: Normocephalic and atraumatic.  Mouth/Throat: No oropharyngeal exudate.  Eyes: Pupils are equal, round, and reactive to light. Conjunctivae and EOM are normal. No scleral icterus.  Neck: Normal range  of motion. Neck supple. No JVD present.  Cardiovascular: Normal rate, regular rhythm and normal heart sounds.  Exam reveals no gallop and no friction rub.   No murmur heard. Pulmonary/Chest: Effort normal and breath sounds normal. No respiratory distress. She has no wheezes. She has no rales.  Abdominal: Soft. Bowel sounds are normal. She exhibits no distension. There is no tenderness. There is no guarding.  Musculoskeletal: Normal range of motion. She exhibits no edema or tenderness.  Lymphadenopathy:    She has no cervical adenopathy.  Neurological: She is alert and oriented to person, place, and time. No cranial nerve deficit. Gait normal.  Skin: Skin is warm and dry. No rash noted. No erythema. No pallor.  Psychiatric: Affect and judgment normal.  Nursing note and vitals reviewed.    LABORATORY DATA: CBC    Component Value Date/Time   WBC 10.9 (H) 10/23/2016 1056   RBC 4.15 10/23/2016 1056   HGB 11.3 (L) 10/23/2016 1056   HGB 13.5 12/27/2015 1553   HGB 10.1 (L) 11/07/2010 0917   HCT 36.7 10/23/2016 1056   HCT 41.3 12/27/2015 1553   HCT 31.1 (L) 11/07/2010 0917   PLT 272 10/23/2016 1056   PLT 406 (H) 12/27/2015 1553   MCV 88.4 10/23/2016 1056   MCV 92 12/27/2015 1553   MCV 100.3 11/07/2010 0917   MCH 27.2 10/23/2016 1056   MCHC 30.8 10/23/2016 1056   RDW 16.1 (H) 10/23/2016 1056   RDW 14.8 12/27/2015 1553   RDW 18.3 (H) 11/07/2010 0917   LYMPHSABS 1.6 10/23/2016 1056   LYMPHSABS 1.9 12/27/2015 1553   LYMPHSABS 1.6 11/07/2010 0917   MONOABS 1.3 (H) 10/23/2016 1056   MONOABS 1.4 (H) 11/07/2010 0917   EOSABS 0.2 10/23/2016 1056   EOSABS 0.2 12/27/2015 1553   BASOSABS 0.0 10/23/2016 1056   BASOSABS 0.0 12/27/2015 1553   BASOSABS 0.0 11/07/2010 0917      Chemistry      Component Value Date/Time   NA 138 10/23/2016 1056   NA 138  10/19/2016 1010   K 3.6 10/23/2016 1056   CL 92 (L) 10/23/2016 1056   CO2 37 (H) 10/23/2016 1056   BUN 17 10/23/2016 1056   BUN 14  10/19/2016 1010   CREATININE 0.58 10/23/2016 1056      Component Value Date/Time   CALCIUM 9.9 10/23/2016 1056   CALCIUM 7.2 (L) 05/15/2016 1032   ALKPHOS 74 10/23/2016 1056   AST 27 10/23/2016 1056   ALT 24 10/23/2016 1056   BILITOT 0.3 10/23/2016 1056   BILITOT <0.2 10/19/2016 1010        PENDING LABS:   RADIOGRAPHIC STUDIES: I have personally reviewed the radiological images as listed and agreed with the findings in the report.  NUCLEAR MEDICINE PET SKULL BASE TO THIGH 06/04/2016  IMPRESSION: 1. No residual hypermetabolism within previously hypermetabolic lesions in the left axilla, right latissimus dorsi muscle, anterior right pleural space, liver, left external iliac lymph node and bones. 2. Aortic atherosclerosis (ICD10-170.0). Coronary artery calcification.    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  Complicated by a LEFT invasive lobular carcinoma, Stage IIB (pT3pN2A), S/P left mastectomy by Dr. Arnoldo Morale on 12/16/2015, ER/PR+ 2%.  HER2 NEGATIVE.  Done prior to knowing about Stage IV disease. AND History with right invasive ductal breast cancer, Stage IIIC, ER/PR+ and HER 2 POSITIVE in 2012 managed with right mastectomy, systemic chemotherapy, HER2 targeted therapy, XRT, and anti-endocrine therapy   She started palliative XRT to T8 pathologic fracture on 02/27/2016.  Chemo with docetaxel/herceptin/perjeta for 6 cycles: 03/06/16-06/26/16.  PLAN: Last restaging PET demonstrated recurrent disease in the right lung pleural with possible new malignant pleural effusion. Her bone disease is stable. Last ECHO on 09/11/16 demonstrated EF 55%. She will need ECHO q 3 months, repeat in November. Tolerating kadcyla well. Continue kadcyla q 3 weeks.  Plan to repeat her restaging PET in 6 weeks.  Continue xgeva  q4weeks RTC in 6 weeks for follow up with labs.  All questions were answered. The patient knows to call the clinic with any problems, questions or concerns. We can certainly see the patient much sooner if necessary.  This note is electronically signed by: Twana First, MD 10/23/2016 11:48 AM

## 2016-10-24 LAB — CANCER ANTIGEN 27.29: CA 27.29: 29.9 U/mL (ref 0.0–38.6)

## 2016-10-24 LAB — CANCER ANTIGEN 15-3: CA 15-3: 20.3 U/mL (ref 0.0–25.0)

## 2016-11-01 ENCOUNTER — Encounter (HOSPITAL_BASED_OUTPATIENT_CLINIC_OR_DEPARTMENT_OTHER): Payer: PRIVATE HEALTH INSURANCE

## 2016-11-01 ENCOUNTER — Encounter (HOSPITAL_COMMUNITY): Payer: PRIVATE HEALTH INSURANCE | Attending: Oncology

## 2016-11-01 ENCOUNTER — Encounter (HOSPITAL_COMMUNITY): Payer: Self-pay

## 2016-11-01 VITALS — BP 147/87 | HR 98 | Temp 98.4°F | Resp 18 | Wt 147.0 lb

## 2016-11-01 DIAGNOSIS — C50912 Malignant neoplasm of unspecified site of left female breast: Secondary | ICD-10-CM | POA: Insufficient documentation

## 2016-11-01 DIAGNOSIS — C7951 Secondary malignant neoplasm of bone: Secondary | ICD-10-CM | POA: Diagnosis not present

## 2016-11-01 DIAGNOSIS — C787 Secondary malignant neoplasm of liver and intrahepatic bile duct: Secondary | ICD-10-CM

## 2016-11-01 LAB — COMPREHENSIVE METABOLIC PANEL
ALBUMIN: 3.7 g/dL (ref 3.5–5.0)
ALK PHOS: 94 U/L (ref 38–126)
ALT: 45 U/L (ref 14–54)
ANION GAP: 12 (ref 5–15)
AST: 52 U/L — AB (ref 15–41)
BILIRUBIN TOTAL: 0.3 mg/dL (ref 0.3–1.2)
BUN: 18 mg/dL (ref 6–20)
CALCIUM: 10 mg/dL (ref 8.9–10.3)
CO2: 36 mmol/L — AB (ref 22–32)
CREATININE: 0.59 mg/dL (ref 0.44–1.00)
Chloride: 90 mmol/L — ABNORMAL LOW (ref 101–111)
GFR calc Af Amer: >60 mL/min (ref >60)
GFR calc non Af Amer: >60 mL/min (ref >60)
GLUCOSE: 171 mg/dL — AB (ref 65–99)
Potassium: 3.3 mmol/L — ABNORMAL LOW (ref 3.5–5.1)
SODIUM: 138 mmol/L (ref 135–145)
TOTAL PROTEIN: 7.6 g/dL (ref 6.5–8.1)

## 2016-11-01 LAB — CBC WITH DIFFERENTIAL/PLATELET
BASOS PCT: 0 %
Basophils Absolute: 0 10*3/uL (ref 0.0–0.1)
Eosinophils Absolute: 0.2 10*3/uL (ref 0.0–0.7)
Eosinophils Relative: 2 %
HEMATOCRIT: 37.8 % (ref 36.0–46.0)
HEMOGLOBIN: 11.6 g/dL — AB (ref 12.0–15.0)
Lymphocytes Relative: 17 %
Lymphs Abs: 1.8 10*3/uL (ref 0.7–4.0)
MCH: 26.7 pg (ref 26.0–34.0)
MCHC: 30.7 g/dL (ref 30.0–36.0)
MCV: 86.9 fL (ref 78.0–100.0)
MONOS PCT: 10 %
Monocytes Absolute: 1.1 10*3/uL — ABNORMAL HIGH (ref 0.1–1.0)
NEUTROS ABS: 7.1 10*3/uL (ref 1.7–7.7)
NEUTROS PCT: 71 %
Platelets: 165 10*3/uL (ref 150–400)
RBC: 4.35 MIL/uL (ref 3.87–5.11)
RDW: 16.1 % — ABNORMAL HIGH (ref 11.5–15.5)
WBC: 10.2 10*3/uL (ref 4.0–10.5)

## 2016-11-01 MED ORDER — DENOSUMAB 120 MG/1.7ML ~~LOC~~ SOLN
120.0000 mg | Freq: Once | SUBCUTANEOUS | Status: AC
  Administered 2016-11-01: 120 mg via SUBCUTANEOUS
  Filled 2016-11-01: qty 1.7

## 2016-11-01 NOTE — Progress Notes (Signed)
Sheila Garrison tolerated Xgeva injection well without complaints or incident. Labs reviewed with Calcium 10 prior to administering this injection.Pt denied any tooth,jaw or leg pain as well as any recent or future dental visits prior to giving this injection. VSS Pt discharged self ambulatory accompanied by a friend

## 2016-11-01 NOTE — Patient Instructions (Signed)
[facility] at [facility] Discharge Instructions  RECOMMENDATIONS MADE BY THE CONSULTANT AND ANY TEST RESULTS WILL BE SENT TO YOUR REFERRING PHYSICIAN.  Received Xgeva injection today. Follow-up as scheduled. Call clinic for any questions or concerns  Thank you for choosing [facility] at [facility] to provide your oncology and hematology care.  To afford each patient quality time with our provider, please arrive at least 15 minutes before your scheduled appointment time.    If you have a lab appointment with the Cancer Center please come in thru the  Main Entrance and check in at the main information desk  You need to re-schedule your appointment should you arrive 10 or more minutes late.  We strive to give you quality time with our providers, and arriving late affects you and other patients whose appointments are after yours.  Also, if you no show three or more times for appointments you may be dismissed from the clinic at the providers discretion.     Again, thank you for choosing [facility].  Our hope is that these requests will decrease the amount of time that you wait before being seen by our physicians.       _____________________________________________________________  Should you have questions after your visit to [facility], please contact our office at [phone] between the hours of 8:30 a.m. and 4:30 p.m.  Voicemails left after 4:30 p.m. will not be returned until the following business day.  For prescription refill requests, have your pharmacy contact our office.       Resources For Cancer Patients and their Caregivers ? American Cancer Society: Can assist with transportation, wigs, general needs, runs Look Good Feel Better.        1-888-227-6333 ? Cancer Care: Provides financial assistance, online support groups, medication/co-pay assistance.  1-800-813-HOPE (4673) ? [facility] Assists [place] cancer patients and their families through emotional , educational and financial support.  336-427-4357 ? [place] DSS Where to apply for food stamps, Medicaid and utility assistance. 336-342-1394 ? RCATS: Transportation to medical appointments. 336-347-2287 ? Social Security Administration: May apply for disability if have a Stage IV cancer. 336-342-7796 1-800-772-1213 ? [place] Aging, Disability and Transit Services: Assists with nutrition, care and transit needs. 336-349-2343  Cancer Center Support Programs: @10RELATIVEDAYS@ > Cancer Support Group  2nd Tuesday of the month 1pm-2pm, Journey Room  > Creative Journey  3rd Tuesday of the month 1130am-1pm, Journey Room  > Look Good Feel Better  1st Wednesday of the month 10am-12 noon, Journey Room (Call [employer] to register 1-800-395-5775)   

## 2016-11-05 ENCOUNTER — Telehealth: Payer: Self-pay | Admitting: *Deleted

## 2016-11-05 NOTE — Telephone Encounter (Signed)
Pt is needing letter written and faxed to [employer] to come pickup her old oxygen equipment. Their fax number [phone]. Please include for them to contact patient first before coming to pickup equipment.  Letter is pending. Please print & sign

## 2016-11-06 NOTE — Telephone Encounter (Signed)
Done

## 2016-11-13 ENCOUNTER — Encounter (HOSPITAL_COMMUNITY): Payer: PRIVATE HEALTH INSURANCE | Attending: Hematology & Oncology

## 2016-11-13 ENCOUNTER — Encounter (HOSPITAL_COMMUNITY): Payer: Self-pay

## 2016-11-13 VITALS — BP 129/60 | HR 74 | Temp 97.7°F | Resp 16 | Wt 152.0 lb

## 2016-11-13 DIAGNOSIS — C787 Secondary malignant neoplasm of liver and intrahepatic bile duct: Secondary | ICD-10-CM | POA: Diagnosis not present

## 2016-11-13 DIAGNOSIS — Z5112 Encounter for antineoplastic immunotherapy: Secondary | ICD-10-CM

## 2016-11-13 DIAGNOSIS — C50912 Malignant neoplasm of unspecified site of left female breast: Secondary | ICD-10-CM

## 2016-11-13 DIAGNOSIS — C7951 Secondary malignant neoplasm of bone: Secondary | ICD-10-CM | POA: Diagnosis present

## 2016-11-13 DIAGNOSIS — C50919 Malignant neoplasm of unspecified site of unspecified female breast: Secondary | ICD-10-CM | POA: Diagnosis not present

## 2016-11-13 LAB — CBC WITH DIFFERENTIAL/PLATELET
Basophils Absolute: 0 10*3/uL (ref 0.0–0.1)
Basophils Relative: 0 %
EOS ABS: 0.3 10*3/uL (ref 0.0–0.7)
EOS PCT: 2 %
HCT: 36.3 % (ref 36.0–46.0)
Hemoglobin: 10.7 g/dL — ABNORMAL LOW (ref 12.0–15.0)
LYMPHS ABS: 1.8 10*3/uL (ref 0.7–4.0)
LYMPHS PCT: 16 %
MCH: 26.2 pg (ref 26.0–34.0)
MCHC: 29.5 g/dL — AB (ref 30.0–36.0)
MCV: 89 fL (ref 78.0–100.0)
MONO ABS: 1 10*3/uL (ref 0.1–1.0)
MONOS PCT: 9 %
Neutro Abs: 8.2 10*3/uL — ABNORMAL HIGH (ref 1.7–7.7)
Neutrophils Relative %: 73 %
PLATELETS: 283 10*3/uL (ref 150–400)
RBC: 4.08 MIL/uL (ref 3.87–5.11)
RDW: 16.9 % — ABNORMAL HIGH (ref 11.5–15.5)
WBC: 11.3 10*3/uL — AB (ref 4.0–10.5)

## 2016-11-13 LAB — COMPREHENSIVE METABOLIC PANEL
ALT: 53 U/L (ref 14–54)
ANION GAP: 8 (ref 5–15)
AST: 49 U/L — ABNORMAL HIGH (ref 15–41)
Albumin: 3.4 g/dL — ABNORMAL LOW (ref 3.5–5.0)
Alkaline Phosphatase: 96 U/L (ref 38–126)
BUN: 22 mg/dL — ABNORMAL HIGH (ref 6–20)
CALCIUM: 9.2 mg/dL (ref 8.9–10.3)
CHLORIDE: 94 mmol/L — AB (ref 101–111)
CO2: 34 mmol/L — AB (ref 22–32)
CREATININE: 0.52 mg/dL (ref 0.44–1.00)
Glucose, Bld: 87 mg/dL (ref 65–99)
Potassium: 3.7 mmol/L (ref 3.5–5.1)
SODIUM: 136 mmol/L (ref 135–145)
Total Bilirubin: 0.3 mg/dL (ref 0.3–1.2)
Total Protein: 7 g/dL (ref 6.5–8.1)

## 2016-11-13 MED ORDER — ADO-TRASTUZUMAB EMTANSINE CHEMO INJECTION 160 MG
3.9000 mg/kg | Freq: Once | INTRAVENOUS | Status: AC
  Administered 2016-11-13: 260 mg via INTRAVENOUS
  Filled 2016-11-13: qty 8

## 2016-11-13 MED ORDER — SODIUM CHLORIDE 0.9% FLUSH
10.0000 mL | INTRAVENOUS | Status: DC | PRN
  Administered 2016-11-13: 10 mL
  Filled 2016-11-13: qty 10

## 2016-11-13 MED ORDER — ACETAMINOPHEN 325 MG PO TABS: 650.0000 mg | ORAL_TABLET | Freq: Once | ORAL | Status: DC

## 2016-11-13 MED ORDER — DIPHENHYDRAMINE HCL 25 MG PO CAPS: 50.0000 mg | ORAL_CAPSULE | Freq: Once | ORAL | Status: DC

## 2016-11-13 MED ORDER — SODIUM CHLORIDE 0.9 % IV SOLN
Freq: Once | INTRAVENOUS | Status: AC
  Administered 2016-11-13: 11:00:00 via INTRAVENOUS

## 2016-11-13 MED ORDER — HEPARIN SOD (PORK) LOCK FLUSH 100 UNIT/ML IV SOLN
500.0000 [IU] | Freq: Once | INTRAVENOUS | Status: AC | PRN
  Administered 2016-11-13: 500 [IU]
  Filled 2016-11-13: qty 5

## 2016-11-13 NOTE — Progress Notes (Signed)
Patient tolerated chemotherapy with no complaints voiced.  Port site clean and dry with no bruising or swelling noted at site.  Flushed per protocol.  Band aid applied.  VSS with discharge and left ambulatory with friend.  No s/s of distress noted.

## 2016-11-13 NOTE — Patient Instructions (Signed)
[facility] Discharge Instructions for Patients Receiving Chemotherapy  Today you received the following chemotherapy agents kadcyla.   To help prevent nausea and vomiting after your treatment, we encourage you to take your nausea medication.    If you develop nausea and vomiting that is not controlled by your nausea medication, call the clinic.   BELOW ARE SYMPTOMS THAT SHOULD BE REPORTED IMMEDIATELY:  *FEVER GREATER THAN 100.5 F  *CHILLS WITH OR WITHOUT FEVER  NAUSEA AND VOMITING THAT IS NOT CONTROLLED WITH YOUR NAUSEA MEDICATION  *UNUSUAL SHORTNESS OF BREATH  *UNUSUAL BRUISING OR BLEEDING  TENDERNESS IN MOUTH AND THROAT WITH OR WITHOUT PRESENCE OF ULCERS  *URINARY PROBLEMS  *BOWEL PROBLEMS  UNUSUAL RASH Items with * indicate a potential emergency and should be followed up as soon as possible.  Feel free to call the clinic should you have any questions or concerns. The clinic phone number is [phone].  Please show the [facility] at check-in to the Emergency Department and triage nurse.

## 2016-11-14 LAB — CANCER ANTIGEN 15-3: CA 15-3: 22.1 U/mL (ref 0.0–25.0)

## 2016-11-14 LAB — CANCER ANTIGEN 27.29: CA 27.29: 25 U/mL (ref 0.0–38.6)

## 2016-11-22 ENCOUNTER — Other Ambulatory Visit: Payer: Self-pay | Admitting: Family

## 2016-11-29 ENCOUNTER — Other Ambulatory Visit (HOSPITAL_COMMUNITY): Payer: PRIVATE HEALTH INSURANCE

## 2016-11-29 ENCOUNTER — Encounter (HOSPITAL_COMMUNITY): Payer: PRIVATE HEALTH INSURANCE

## 2016-11-29 ENCOUNTER — Encounter (HOSPITAL_COMMUNITY): Payer: Self-pay

## 2016-11-29 ENCOUNTER — Ambulatory Visit (HOSPITAL_COMMUNITY): Payer: PRIVATE HEALTH INSURANCE

## 2016-11-29 ENCOUNTER — Encounter (HOSPITAL_BASED_OUTPATIENT_CLINIC_OR_DEPARTMENT_OTHER): Payer: PRIVATE HEALTH INSURANCE

## 2016-11-29 DIAGNOSIS — C50919 Malignant neoplasm of unspecified site of unspecified female breast: Secondary | ICD-10-CM | POA: Diagnosis not present

## 2016-11-29 DIAGNOSIS — C7951 Secondary malignant neoplasm of bone: Secondary | ICD-10-CM | POA: Diagnosis not present

## 2016-11-29 DIAGNOSIS — C50912 Malignant neoplasm of unspecified site of left female breast: Secondary | ICD-10-CM | POA: Diagnosis not present

## 2016-11-29 DIAGNOSIS — C787 Secondary malignant neoplasm of liver and intrahepatic bile duct: Secondary | ICD-10-CM | POA: Diagnosis not present

## 2016-11-29 LAB — CBC WITH DIFFERENTIAL/PLATELET
BASOS PCT: 0 %
Basophils Absolute: 0 10*3/uL (ref 0.0–0.1)
EOS ABS: 0.2 10*3/uL (ref 0.0–0.7)
EOS PCT: 2 %
HCT: 37.6 % (ref 36.0–46.0)
HEMOGLOBIN: 11.2 g/dL — AB (ref 12.0–15.0)
LYMPHS ABS: 1.7 10*3/uL (ref 0.7–4.0)
Lymphocytes Relative: 14 %
MCH: 26.1 pg (ref 26.0–34.0)
MCHC: 29.8 g/dL — AB (ref 30.0–36.0)
MCV: 87.6 fL (ref 78.0–100.0)
MONOS PCT: 11 %
Monocytes Absolute: 1.3 10*3/uL — ABNORMAL HIGH (ref 0.1–1.0)
NEUTROS PCT: 73 %
Neutro Abs: 8.7 10*3/uL — ABNORMAL HIGH (ref 1.7–7.7)
Platelets: 243 10*3/uL (ref 150–400)
RBC: 4.29 MIL/uL (ref 3.87–5.11)
RDW: 17.1 % — AB (ref 11.5–15.5)
WBC: 12 10*3/uL — ABNORMAL HIGH (ref 4.0–10.5)

## 2016-11-29 LAB — COMPREHENSIVE METABOLIC PANEL
ALBUMIN: 3.5 g/dL (ref 3.5–5.0)
ALK PHOS: 102 U/L (ref 38–126)
ALT: 44 U/L (ref 14–54)
ANION GAP: 9 (ref 5–15)
AST: 44 U/L — ABNORMAL HIGH (ref 15–41)
BUN: 16 mg/dL (ref 6–20)
CALCIUM: 9.2 mg/dL (ref 8.9–10.3)
CO2: 35 mmol/L — AB (ref 22–32)
Chloride: 92 mmol/L — ABNORMAL LOW (ref 101–111)
Creatinine, Ser: 0.66 mg/dL (ref 0.44–1.00)
GFR calc non Af Amer: >60 mL/min (ref >60)
GLUCOSE: 137 mg/dL — AB (ref 65–99)
POTASSIUM: 3.8 mmol/L (ref 3.5–5.1)
SODIUM: 136 mmol/L (ref 135–145)
TOTAL PROTEIN: 7.6 g/dL (ref 6.5–8.1)
Total Bilirubin: 0.3 mg/dL (ref 0.3–1.2)

## 2016-11-29 MED ORDER — DENOSUMAB 120 MG/1.7ML ~~LOC~~ SOLN
120.0000 mg | Freq: Once | SUBCUTANEOUS | Status: AC
  Administered 2016-11-29: 120 mg via SUBCUTANEOUS
  Filled 2016-11-29: qty 1.7

## 2016-11-29 NOTE — Progress Notes (Signed)
Sheila Garrison presents today for injection per the provider's orders.  Xgeva administration without incident; see MAR for injection details.  Patient tolerated procedure well and without incident.  No questions or complaints noted at this time. Discharged via wheelchair in c/o family.

## 2016-11-30 ENCOUNTER — Encounter (HOSPITAL_COMMUNITY)
Admission: RE | Admit: 2016-11-30 | Discharge: 2016-11-30 | Disposition: A | Discharge status: Home / Self Care | Payer: PRIVATE HEALTH INSURANCE | Source: Ambulatory Visit | Attending: Oncology | Admitting: Oncology

## 2016-11-30 DIAGNOSIS — C50111 Malignant neoplasm of central portion of right female breast: Secondary | ICD-10-CM | POA: Insufficient documentation

## 2016-11-30 DIAGNOSIS — C7951 Secondary malignant neoplasm of bone: Secondary | ICD-10-CM | POA: Insufficient documentation

## 2016-11-30 LAB — CANCER ANTIGEN 27.29: CA 27.29: 31.9 U/mL (ref 0.0–38.6)

## 2016-11-30 LAB — GLUCOSE, CAPILLARY: GLUCOSE-CAPILLARY: 103 mg/dL — AB (ref 65–99)

## 2016-11-30 LAB — CANCER ANTIGEN 15-3: CA 15-3: 24 U/mL (ref 0.0–25.0)

## 2016-11-30 MED ORDER — FLUDEOXYGLUCOSE F - 18 (FDG) INJECTION
7.5000 | Freq: Once | INTRAVENOUS | Status: AC | PRN
  Administered 2016-11-30: 7.5 via INTRAVENOUS

## 2016-12-04 ENCOUNTER — Ambulatory Visit (HOSPITAL_COMMUNITY): Payer: PRIVATE HEALTH INSURANCE

## 2016-12-05 ENCOUNTER — Encounter (HOSPITAL_COMMUNITY): Payer: Self-pay | Admitting: Oncology

## 2016-12-05 ENCOUNTER — Encounter (HOSPITAL_BASED_OUTPATIENT_CLINIC_OR_DEPARTMENT_OTHER): Payer: PRIVATE HEALTH INSURANCE

## 2016-12-05 ENCOUNTER — Encounter (HOSPITAL_BASED_OUTPATIENT_CLINIC_OR_DEPARTMENT_OTHER): Payer: PRIVATE HEALTH INSURANCE | Admitting: Oncology

## 2016-12-05 VITALS — BP 93/65 | HR 72 | Temp 98.0°F | Resp 18 | Wt 150.0 lb

## 2016-12-05 DIAGNOSIS — C7951 Secondary malignant neoplasm of bone: Secondary | ICD-10-CM

## 2016-12-05 DIAGNOSIS — C50912 Malignant neoplasm of unspecified site of left female breast: Secondary | ICD-10-CM

## 2016-12-05 DIAGNOSIS — C787 Secondary malignant neoplasm of liver and intrahepatic bile duct: Secondary | ICD-10-CM

## 2016-12-05 DIAGNOSIS — G629 Polyneuropathy, unspecified: Secondary | ICD-10-CM | POA: Diagnosis not present

## 2016-12-05 DIAGNOSIS — Z5112 Encounter for antineoplastic immunotherapy: Secondary | ICD-10-CM

## 2016-12-05 DIAGNOSIS — J90 Pleural effusion, not elsewhere classified: Secondary | ICD-10-CM

## 2016-12-05 DIAGNOSIS — R0602 Shortness of breath: Secondary | ICD-10-CM

## 2016-12-05 DIAGNOSIS — C50919 Malignant neoplasm of unspecified site of unspecified female breast: Secondary | ICD-10-CM | POA: Diagnosis not present

## 2016-12-05 LAB — CBC WITH DIFFERENTIAL/PLATELET
BASOS PCT: 0 %
Basophils Absolute: 0 10*3/uL (ref 0.0–0.1)
EOS ABS: 0.3 10*3/uL (ref 0.0–0.7)
Eosinophils Relative: 2 %
HEMATOCRIT: 35.3 % — AB (ref 36.0–46.0)
Hemoglobin: 10.5 g/dL — ABNORMAL LOW (ref 12.0–15.0)
LYMPHS ABS: 1.9 10*3/uL (ref 0.7–4.0)
Lymphocytes Relative: 18 %
MCH: 25.9 pg — AB (ref 26.0–34.0)
MCHC: 29.7 g/dL — AB (ref 30.0–36.0)
MCV: 87.2 fL (ref 78.0–100.0)
MONO ABS: 1 10*3/uL (ref 0.1–1.0)
MONOS PCT: 10 %
Neutro Abs: 7.4 10*3/uL (ref 1.7–7.7)
Neutrophils Relative %: 70 %
Platelets: 283 10*3/uL (ref 150–400)
RBC: 4.05 MIL/uL (ref 3.87–5.11)
RDW: 17.4 % — AB (ref 11.5–15.5)
WBC: 10.6 10*3/uL — ABNORMAL HIGH (ref 4.0–10.5)

## 2016-12-05 LAB — COMPREHENSIVE METABOLIC PANEL
ALBUMIN: 3.5 g/dL (ref 3.5–5.0)
ALT: 29 U/L (ref 14–54)
ANION GAP: 8 (ref 5–15)
AST: 32 U/L (ref 15–41)
Alkaline Phosphatase: 93 U/L (ref 38–126)
BILIRUBIN TOTAL: 0.5 mg/dL (ref 0.3–1.2)
BUN: 19 mg/dL (ref 6–20)
CALCIUM: 9.4 mg/dL (ref 8.9–10.3)
CO2: 38 mmol/L — ABNORMAL HIGH (ref 22–32)
Chloride: 92 mmol/L — ABNORMAL LOW (ref 101–111)
Creatinine, Ser: 0.6 mg/dL (ref 0.44–1.00)
GFR calc non Af Amer: >60 mL/min (ref >60)
GLUCOSE: 98 mg/dL (ref 65–99)
POTASSIUM: 3.6 mmol/L (ref 3.5–5.1)
SODIUM: 138 mmol/L (ref 135–145)
TOTAL PROTEIN: 7.3 g/dL (ref 6.5–8.1)

## 2016-12-05 MED ORDER — DIPHENHYDRAMINE HCL 25 MG PO CAPS
50.0000 mg | ORAL_CAPSULE | Freq: Once | ORAL | Status: DC
  Filled 2016-12-05: qty 2

## 2016-12-05 MED ORDER — SODIUM CHLORIDE 0.9% FLUSH
10.0000 mL | INTRAVENOUS | Status: DC | PRN
  Administered 2016-12-05: 10 mL
  Filled 2016-12-05: qty 10

## 2016-12-05 MED ORDER — SODIUM CHLORIDE 0.9 % IV SOLN
3.9000 mg/kg | Freq: Once | INTRAVENOUS | Status: AC
  Administered 2016-12-05: 260 mg via INTRAVENOUS
  Filled 2016-12-05: qty 8

## 2016-12-05 MED ORDER — SODIUM CHLORIDE 0.9 % IV SOLN
Freq: Once | INTRAVENOUS | Status: AC
  Administered 2016-12-05: 11:00:00 via INTRAVENOUS

## 2016-12-05 MED ORDER — HEPARIN SOD (PORK) LOCK FLUSH 100 UNIT/ML IV SOLN
500.0000 [IU] | Freq: Once | INTRAVENOUS | Status: AC | PRN
  Administered 2016-12-05: 500 [IU]

## 2016-12-05 MED ORDER — ACETAMINOPHEN 325 MG PO TABS
650.0000 mg | ORAL_TABLET | Freq: Once | ORAL | Status: DC
  Filled 2016-12-05: qty 2

## 2016-12-05 NOTE — Progress Notes (Signed)
Sheila Garrison tolerated Kadcyla infusion well without complaints or incident. Labs reviewed with Dr. Talbert Cage prior to administering this medication. VSS upon discharge. Pt discharged self ambulatory in satisfactory condition accompanied by a friend

## 2016-12-05 NOTE — Patient Instructions (Addendum)
[facility] at [facility] Discharge Instructions  RECOMMENDATIONS MADE BY THE CONSULTANT AND ANY TEST RESULTS WILL BE SENT TO YOUR REFERRING PHYSICIAN.  You were seen by Dr. Talbert Cage Continue getting treatment every 6 weeks Follow up with [place] in 6 weeks We will schedule your ECHO cardiogram in November  Thank you for choosing Center at [facility] to provide your oncology and hematology care.  To afford each patient quality time with our provider, please arrive at least 15 minutes before your scheduled appointment time.    If you have a lab appointment with the [facility] please come in thru the  Main Entrance and check in at the main information desk  You need to re-schedule your appointment should you arrive 10 or more minutes late.  We strive to give you quality time with our providers, and arriving late affects you and other patients whose appointments are after yours.  Also, if you no show three or more times for appointments you may be dismissed from the clinic at the providers discretion.     Again, thank you for choosing [facility].  Our hope is that these requests will decrease the amount of time that you wait before being seen by our physicians.       _____________________________________________________________  Should you have questions after your visit to [facility], please contact our office at [phone] between the hours of 8:30 a.m. and 4:30 p.m.  Voicemails left after 4:30 p.m. will not be returned until the following business day.  For prescription refill requests, have your pharmacy contact our office.       Resources For Cancer Patients and their Caregivers ? American Cancer Society: Can assist with transportation, wigs, general needs, runs Look Good Feel Better.        682-340-6342 ? Cancer Care: Provides financial assistance, online support groups, medication/co-pay assistance.   1-800-813-HOPE 641-239-4217) ? [facility] Assists [place] cancer patients and their families through emotional , educational and financial support.  (782)022-8686 ? [place] DSS Where to apply for food stamps, Medicaid and utility assistance. 2536656984 ? RCATS: Transportation to medical appointments. (602) 410-6412 ? Social Security Administration: May apply for disability if have a Stage IV cancer. 204-039-0606 510 313 8914 ? [employer], Disability and Transit Services: Assists with nutrition, care and transit needs. [place] Support Programs: @10RELATIVEDAYS @ > Cancer Support Group  2nd Tuesday of the month 1pm-2pm, Journey Room  > Creative Journey  3rd Tuesday of the month 1130am-1pm, Journey Room  > Look Good Feel Better  1st Wednesday of the month 10am-12 noon, Journey Room (Call [place] to register [phone])

## 2016-12-05 NOTE — Progress Notes (Signed)
Sheila Garrison, [place] [address]  Progress Note  Breast Cancer  CURRENT THERAPY: Docetaxel/Herceptin/Perjeta beginning on 03/06/2016.  Docetaxel dose reduced by 20% beginning on 03/27/2016.  INTERVAL HISTORY: Sheila Garrison 61 y.o. 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 AND Invasive lobular left breast cancer, Stage IIB (pT3pN2A), S/P left mastectomy by Dr. Arnoldo Morale on 12/16/2015, ER/PR+ 2%.  HER2 NEGATIVE.  Done prior to knowing about Stage IV disease. 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 Margot Chimes, systemic chemotherapy by Dr. Jana Hakim in 2012 and Herceptin x 52 weeks finishing on 07/10/2011, XRT (01/04/2011- 02/22/2011), and anti-endocrine therapy.    Stage III (T4N3) R Br cancer    06/21/2010 Initial Diagnosis    Stage III (T4N3) R Br cancer       01/04/2011 - 02/22/2011 Radiation Therapy    50 Gy       05/07/2014 Pathology Results    BCI testing- low risk of late recurrence (3.4% between years 5-10), high likelihood of benefit from extended endocrein therapy, and a 67% relative risk reduction when treated with extended endocrine therapy (27% versus 10.5%).       [name], stage 4, left ([place])   11/22/2015 Mammogram    Ultrasound-guided biopsy of the irregular hypoechoic area, with prominent shadowing, in the OUTER left breast at the 1 o'clock axis. 2. Ultrasound-guided biopsy of the irregular mass within the INNER left breast at the 9 o'clock axis. 3. Ultrasound-guided biopsy of 1 of the enlarged and amorphous lymph nodes in the left axilla.      11/22/2015 Imaging    Ultrasound-guided biopsy of the irregular hypoechoic area, with prominent shadowing, in the OUTER left breast at the 1 o'clock axis. 2. Ultrasound-guided biopsy of  the irregular mass within the INNER left breast at the 9 o'clock axis. 3. Ultrasound-guided biopsy of 1 of the enlarged and amorphous lymph nodes in the left axilla.      12/16/2015 Surgery    L modified radical mastectomy with Dr. Arnoldo Morale      12/18/2015 - 12/21/2015 Hospital Admission    Admit date: 12/18/2015 Admission diagnosis: altered MS, acute respiratory failure Additional comments: This likely was a combination of COPD exacerbation along with mild acute on chronic diastolic CHF with  echo shows a preserved EF of around 60%, he was treated with IV steroids and Lasix, she is much better but qualifies for home oxygen which will be provided      12/19/2015 Imaging    No evidence of pulmonary embolus. 2. Small right pleural effusion, with associated atelectasis. Peripheral scarring at the anterior right upper lobe. Mild bilateral emphysema noted. 3. Scattered coronary artery calcifications seen. 4. Postoperative change at the left chest wall, reflecting recent mastectomy, with vague collections of postoperative fluid seen. 5. Vague sclerotic change and heterogeneity at vertebral body T8 raises concern for sequelae of metastatic disease      01/23/2016 PET scan    1. Widespread multifocal metastatic disease in the visualized axial and appendicular skeleton. 2. Solitary metastatic lesion in the liver. Hypermetabolic irregular left axillary lymph node adjacent to the left axillary clips. 3. Activity in the left breast related to mastectomy. 4. Low-grade activity along the pleuroparenchymal thickening anteriorly in the right chest is probably related to prior therapy rather than current  malignancy. 5. Probable mild compression fracture at T8 associated with the extensive tumor at this level. There is also bony destruction of the right ninth rib posteriorly. 6. Faintly hypermetabolic but small left external iliac lymph nodes merit surveillance  ADDENDUM: The original report  was by Dr. Van Clines. The following addendum is by Dr. Van Clines:  I have one other musculoskeletal focus of hypermetabolic activity to mention. In the right latissimus dorsi muscle, there is a small hypermetabolic focus with maximum SUV of 5.4, measuring 7 mm in diameter on image 89/4, compatible with a metastatic deposit.        02/02/2016 Imaging    MRI T-spine: 1. Extensive metastatic disease throughout the cervical and thoracic spine with a subtle pathologic compression fracture of T8. Tumor extends through the posterior margin of the T8 vertebra into the spinal canal without spinal cord compression at this time. 2. No other pathologic fractures of the thoracic spine at this time.      02/10/2016 Procedure    US biopsy of liver lesion      02/15/2016 Pathology Results    Liver, needle/core biopsy, right lobe METASTATIC CARCINOMA, CONSISTENT WITH BREAST DUCTAL CARCINOMA.      02/15/2016 - 02/28/2016 Chemotherapy    Letrozole 2.5 mg daily + Kisqali (600 mg) days 1-21 every 28 days       02/21/2016 Pathology Results    Breast Prognostic profile from liver biopsy: HER2 POSITIVE, ER/PR NEGATIVE (0%).      02/27/2016 - 03/09/2016 Radiation Therapy    Palliative XRT to T7-T11 with Dr. Lianne Cure; 30 Gy in 10 fractions.       02/28/2016 Treatment Plan Change    Due to HER2 positivity and ER/PR negativity from liver biopsy, change to systemic chemotherapy is recommended.      03/06/2016 -  Chemotherapy    Docetaxel/Herceptin/Perjeta with Neulasta support.      03/11/2016 - 03/15/2016 Hospital Admission    Admit date: 03/11/2016 Admission diagnosis: Febrile neutropenia Additional comments: COPD exacerbation      03/27/2016 Treatment Plan Change    Docetaxel dose reduced by 20%.      03/27/2016 Adverse Reaction    Demonstrating early signs of palmar-plantar erythrodysesthesia.      03/27/2016 Treatment Plan Change    Defer treatment 1 week.      04/10/2016 Imaging     MUGA- The left ventricular ejection fraction is equal to 56.2%. Normal left ventricular wall motion.      06/04/2016 PET scan    1. No residual hypermetabolism within previously hypermetabolic lesions in the left axilla, right latissimus dorsi muscle, anterior right pleural space, liver, left external iliac lymph node and bones. 2. Aortic atherosclerosis (ICD10-170.0). Coronary artery calcification.      07/25/2016 Treatment Plan Change    Started Nerlynx      09/03/2016 PET scan     Interval development of small to moderate right pleural effusion associated with a linear band of hypermetabolism in the posterior right costophrenic sulcus ( SUV max = 5.7) . Imaging features are concerning for recurrent posterior right pleural disease.      09/06/2016 Treatment Plan Change    Stopped Nerlyn due to evidence of recurrent posterior right pleural disease.       09/11/2016 Treatment Plan Change    Started Kadcyla      11/30/2016 PET scan    IMPRESSION: 1. Reduction in volume of RIGHT pleural effusion and resolution of metabolic activity RIGHT at lung base most consistent  with resolving inflammatory or infectious process. 2. No evidence disease progression. 3. Multiple sclerotic skeletal metastasis throughout the skeleton. Several foci of mild metabolic activity interspersed. No significant change.        Sheila Garrison presents to the clinic for continuing follow up with her friend. She is here for cycle 5 of Kadcyla. She has been tolerating her treatments well. She has her chronic neuropathy which has not worsened. Shortness of breath is at baseline. She continues to to use 4 L of oxygen via nasal cannula. She denies any abdominal pain, chest pain, headache, bone pain, nausea, vomiting, diarrhea. She continues to perform all her ADLs without any difficulty.  Review of Systems  Constitutional: Positive for malaise/fatigue. Negative for chills, fever and weight loss.  HENT:  Negative for hearing loss, sore throat and tinnitus.   Eyes: Negative.  Negative for blurred vision, photophobia and discharge.  Respiratory: Positive for shortness of breath (chronic at baseline). Negative for cough, hemoptysis and wheezing.   Cardiovascular: Negative.  Negative for chest pain, palpitations, orthopnea, claudication and leg swelling.  Gastrointestinal: Negative for abdominal pain, constipation, diarrhea, melena, nausea and vomiting.  Genitourinary: Negative.  Negative for dysuria and hematuria.  Musculoskeletal: Negative for back pain, joint pain, myalgias and neck pain.  Skin: Negative.  Negative for itching and rash.  Neurological: Negative for dizziness, tingling, weakness and headaches.  Endo/Heme/Allergies: Negative.  Negative for environmental allergies and polydipsia. Does not bruise/bleed easily.  Psychiatric/Behavioral: Negative.  Negative for depression. The patient is not nervous/anxious and does not have insomnia.     Past Medical History:  Diagnosis Date  . Anxiety   . Bone metastases ([place]) 02/15/2016  . Breast CA ([place]) 07/01/2010   rt breast ca  . COPD (chronic obstructive pulmonary disease) ([place])   . Family history of breast cancer    Aunt on father's side  . Hyperlipemia   . Hypertension   . MRSA (methicillin resistant staph aureus) culture positive    in breast  . Neuropathy    bilateral toes  . Nipple discharge    with pain, infection, lump  . S/P radiation therapy 01/08/11 - 02/22/11   Right Chest Wall/ 50 Gy / 25 Fractions, Right [place]/ 50 gy/25 Fractions, Right Axillary Boost/500 cGy/25 Fractions, Right Chest Wall Boost/10 Gy/5 Fractions  . Stage III (T4N3) R Br cancer  06/21/2010   Letrozole 2.5 mg which she started on 03/09/2011.  This patient presented to my office on Jun 21, 2010 with a large ulcerated right breast mass and bleeding. She underwent additional workup at that time and a biopsy was done showing invasive breast cancer. She was  staged and was found to have a T4  disease that was ER, PR, HER-2 positive. CA 2729 was normal at 31. She is undergoing chemotherapy wi  . Status post chemotherapy    6 cycles of carboplatin and docetaxel with trastuzumab every 21 days followed by surgery  . Tobacco abuse 06/22/2015  . Use of letrozole ([place]) 03/09/2011  . Wears dentures     Past Surgical History:  Procedure Laterality Date  . BREAST SURGERY  2012  . DILATION AND CURETTAGE OF UTERUS    . MASTECTOMY MODIFIED RADICAL Left 12/16/2015   Procedure: MASTECTOMY MODIFIED RADICAL;  Surgeon: Aviva Signs, MD;  Location: [facility];  Service: General;  Laterality: Left;  . port a catheter insertion    . PORT-A-CATH REMOVAL Left 01/15/2013   Procedure: REMOVAL PORT-A-CATH;  Surgeon: Scherry Ran, MD;  Location: [facility];  Service: General;  Laterality: Left;  . PORTACATH PLACEMENT Right 01/16/2016   Procedure: INSERTION PORT-A-CATH;  Surgeon: Aviva Signs, MD;  Location: [facility];  Service: General;  Laterality: Right;  . TUBAL LIGATION      Family History  Problem Relation Age of Onset  . COPD Mother   . Diabetes Father   . Hypertension Father   . COPD Brother   . Cancer Maternal Aunt        ovarian - died of old age  . Cancer Paternal Aunt        had breast ca; died of of a different cancer  . Lung cancer Brother     Social History   Social History  . Marital status: Married    Spouse name: N/A  . Number of children: N/A  . Years of education: N/A   Social History Main Topics  . Smoking status: Former Smoker    Packs/day: 0.25    Years: 30.00    Types: Cigarettes    Quit date: 12/15/2015  . Smokeless tobacco: Never Used  . Alcohol use No  . Drug use: No  . Sexual activity: Yes    Birth control/ protection: Post-menopausal   Other Topics Concern  . None   Social History Narrative  . None     PHYSICAL EXAMINATION  ECOG PERFORMANCE STATUS: 1 - Symptomatic but completely ambulatory RN vitals  reviewed  Physical Exam  Constitutional: She is oriented to person, place, and time and well-developed, well-nourished, and in no distress. No distress.  HENT:  Head: Normocephalic and atraumatic.  Mouth/Throat: No oropharyngeal exudate.  Eyes: Pupils are equal, round, and reactive to light. Conjunctivae and EOM are normal. No scleral icterus.  Neck: Normal range of motion. Neck supple. No JVD present.  Cardiovascular: Normal rate, regular rhythm and normal heart sounds.  Exam reveals no gallop and no friction rub.   No murmur heard. Pulmonary/Chest: Effort normal and breath sounds normal. No respiratory distress. She has no wheezes. She has no rales.  Abdominal: Soft. Bowel sounds are normal. She exhibits no distension. There is no tenderness. There is no guarding.  Musculoskeletal: Normal range of motion. She exhibits no edema or tenderness.  Lymphadenopathy:    She has no cervical adenopathy.  Neurological: She is alert and oriented to person, place, and time. No cranial nerve deficit. Gait normal.  Skin: Skin is warm and dry. No rash noted. No erythema. No pallor.  Psychiatric: Affect and judgment normal.  Nursing note and vitals reviewed.    LABORATORY DATA: CBC    Component Value Date/Time   WBC 10.6 (H) 12/05/2016 1008   RBC 4.05 12/05/2016 1008   HGB 10.5 (L) 12/05/2016 1008   HGB 13.5 12/27/2015 1553   HGB 10.1 (L) 11/07/2010 0917   HCT 35.3 (L) 12/05/2016 1008   HCT 41.3 12/27/2015 1553   HCT 31.1 (L) 11/07/2010 0917   PLT 283 12/05/2016 1008   PLT 406 (H) 12/27/2015 1553   MCV 87.2 12/05/2016 1008   MCV 92 12/27/2015 1553   MCV 100.3 11/07/2010 0917   MCH 25.9 (L) 12/05/2016 1008   MCHC 29.7 (L) 12/05/2016 1008   RDW 17.4 (H) 12/05/2016 1008   RDW 14.8 12/27/2015 1553   RDW 18.3 (H) 11/07/2010 0917   LYMPHSABS 1.9 12/05/2016 1008   LYMPHSABS 1.9 12/27/2015 1553   LYMPHSABS 1.6 11/07/2010 0917   MONOABS 1.0 12/05/2016 1008   MONOABS 1.4 (H) 11/07/2010 9480  EOSABS 0.3 12/05/2016 1008   EOSABS 0.2 12/27/2015 1553   BASOSABS 0.0 12/05/2016 1008   BASOSABS 0.0 12/27/2015 1553   BASOSABS 0.0 11/07/2010 0917      Chemistry      Component Value Date/Time   NA 138 12/05/2016 1008   NA 138 10/19/2016 1010   K 3.6 12/05/2016 1008   CL 92 (L) 12/05/2016 1008   CO2 38 (H) 12/05/2016 1008   BUN 19 12/05/2016 1008   BUN 14 10/19/2016 1010   CREATININE 0.60 12/05/2016 1008      Component Value Date/Time   CALCIUM 9.4 12/05/2016 1008   CALCIUM 7.2 (L) 05/15/2016 1032   ALKPHOS 93 12/05/2016 1008   AST 32 12/05/2016 1008   ALT 29 12/05/2016 1008   BILITOT 0.5 12/05/2016 1008   BILITOT <0.2 10/19/2016 1010        PENDING LABS:   RADIOGRAPHIC STUDIES: I have personally reviewed the radiological images as listed and agreed with the findings in the report.  NUCLEAR MEDICINE PET SKULL BASE TO THIGH 06/04/2016  IMPRESSION: 1. No residual hypermetabolism within previously hypermetabolic lesions in the left axilla, right latissimus dorsi muscle, anterior right pleural space, liver, left external iliac lymph node and bones. 2. Aortic atherosclerosis (ICD10-170.0). Coronary artery calcification.    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  Complicated by a LEFT invasive lobular carcinoma, Stage IIB (pT3pN2A), S/P left mastectomy by Dr. Arnoldo Morale on 12/16/2015, ER/PR+ 2%.  HER2 NEGATIVE.  Done prior to knowing about Stage IV disease. AND History with right invasive ductal breast cancer, Stage IIIC, ER/PR+ and HER 2 POSITIVE in 2012 managed with right mastectomy, systemic chemotherapy, HER2 targeted therapy, XRT, and anti-endocrine therapy   She started palliative XRT to T8 pathologic fracture on 02/27/2016.  Chemo with docetaxel/herceptin/perjeta for 6 cycles:  03/06/16-06/26/16.  PLAN: Last restaging PET demonstrated recurrent disease in the right lung pleural with possible new malignant pleural effusion. Her bone disease is stable. I have reviewed her restaging PET in detail with her, the previous right lung pleural hypermetabolism has resolved and there is no evidence of new disease.  Last ECHO on 09/11/16 demonstrated EF 55%. She will need ECHO q 3 months, repeat in November, I have ordered it today. Tolerating kadcyla well. Continue kadcyla q 3 weeks.  Continue xgeva q4weeks RTC in 6 weeks for follow up with labs.  All questions were answered. The patient knows to call the clinic with any problems, questions or concerns. We can certainly see the patient much sooner if necessary.  This note is electronically signed by: Twana First, MD 12/05/2016 11:57 AM

## 2016-12-05 NOTE — Patient Instructions (Signed)
[facility] Discharge Instructions for Patients Receiving Chemotherapy   Beginning January 23rd 2017 lab work for the Cancer Center will be done in the  Main lab at [facility] on 1st floor. If you have a lab appointment with the Cancer Center please come in thru the  Main Entrance and check in at the main information desk   Today you received the following chemotherapy agents Kadcyla. Follow-up as scheduled. Call clinic for any questions or concerns  To help prevent nausea and vomiting after your treatment, we encourage you to take your nausea medication   If you develop nausea and vomiting, or diarrhea that is not controlled by your medication, call the clinic.  The clinic phone number is [phone]. Office hours are Monday-Friday 8:30am-5:00pm.  BELOW ARE SYMPTOMS THAT SHOULD BE REPORTED IMMEDIATELY:  *FEVER GREATER THAN 101.0 F  *CHILLS WITH OR WITHOUT FEVER  NAUSEA AND VOMITING THAT IS NOT CONTROLLED WITH YOUR NAUSEA MEDICATION  *UNUSUAL SHORTNESS OF BREATH  *UNUSUAL BRUISING OR BLEEDING  TENDERNESS IN MOUTH AND THROAT WITH OR WITHOUT PRESENCE OF ULCERS  *URINARY PROBLEMS  *BOWEL PROBLEMS  UNUSUAL RASH Items with * indicate a potential emergency and should be followed up as soon as possible. If you have an emergency after office hours please contact your primary care physician or go to the nearest emergency department.  Please call the clinic during office hours if you have any questions or concerns.   You may also contact the Patient Navigator at [phone] should you have any questions or need assistance in obtaining follow up care.      Resources For Cancer Patients and their Caregivers ? American Cancer Society: Can assist with transportation, wigs, general needs, runs Look Good Feel Better.        1-888-227-6333 ? Cancer Care: Provides financial assistance, online support groups, medication/co-pay assistance.  1-800-813-HOPE  (4673) ? [facility] Assists [place] cancer patients and their families through emotional , educational and financial support.  336-427-4357 ? [place] DSS Where to apply for food stamps, Medicaid and utility assistance. 336-342-1394 ? [facility]: Transportation to medical appointments. 336-347-2287 ? Social Security Administration: May apply for disability if have a Stage IV cancer. 336-342-7796 1-800-772-1213 ? [place] Aging, Disability and Transit Services: Assists with nutrition, care and transit needs. 336-349-2343         

## 2016-12-06 LAB — CANCER ANTIGEN 15-3: CA 15-3: 22.5 U/mL (ref 0.0–25.0)

## 2016-12-06 LAB — CANCER ANTIGEN 27.29: CAN 27.29: 28.9 U/mL (ref 0.0–38.6)

## 2016-12-07 ENCOUNTER — Telehealth (HOSPITAL_COMMUNITY): Payer: Self-pay

## 2016-12-07 DIAGNOSIS — C50912 Malignant neoplasm of unspecified site of left female breast: Secondary | ICD-10-CM

## 2016-12-07 DIAGNOSIS — E559 Vitamin D deficiency, unspecified: Secondary | ICD-10-CM

## 2016-12-07 DIAGNOSIS — C7951 Secondary malignant neoplasm of bone: Secondary | ICD-10-CM

## 2016-12-07 MED ORDER — VITAMIN D (ERGOCALCIFEROL) 1.25 MG (50000 UNIT) PO CAPS: 50000.0000 [IU] | ORAL_CAPSULE | ORAL | 0 refills | Status: DC

## 2016-12-07 NOTE — Telephone Encounter (Signed)
Patient called stating she did not have a refill on her Vitamin D. Reviewed with provider. New script sent to patients pharmacy.

## 2016-12-17 ENCOUNTER — Ambulatory Visit (HOSPITAL_COMMUNITY)
Admission: RE | Admit: 2016-12-17 | Discharge: 2016-12-17 | Disposition: A | Discharge status: Home / Self Care | Payer: PRIVATE HEALTH INSURANCE | Source: Ambulatory Visit | Attending: Oncology | Admitting: Oncology

## 2016-12-17 DIAGNOSIS — C50912 Malignant neoplasm of unspecified site of left female breast: Secondary | ICD-10-CM | POA: Diagnosis present

## 2016-12-17 LAB — ECHOCARDIOGRAM COMPLETE
AVLVOTPG: 3 mmHg
E decel time: 250 msec
EERAT: 5.34
FS: 38 % (ref 28–44)
IV/PV OW: 1.09
LA ID, A-P, ES: 24 mm
LA diam index: 1.35 cm/m2
LA vol A4C: 25.3 ml
LA vol index: 18.7 mL/m2
LAVOL: 33.2 mL
LEFT ATRIUM END SYS DIAM: 24 mm
LV SIMPSON'S DISK: 63
LV TDI E'LATERAL: 12.5
LV TDI E'MEDIAL: 7.29
LV dias vol index: 27 mL/m2
LV dias vol: 48 mL (ref 46–106)
LV sys vol index: 10 mL/m2
LVEEAVG: 5.34
LVEEMED: 5.34
LVELAT: 12.5 cm/s
LVOT VTI: 17.2 cm
LVOT area: 3.14 cm2
LVOT diameter: 20 mm
LVOT peak vel: 89.4 cm/s
LVOTSV: 54 mL
LVSYSVOL: 18 mL (ref 14–42)
MV Dec: 250
MVPKAVEL: 76.4 m/s
MVPKEVEL: 66.8 m/s
PW: 10 mm — AB (ref 0.6–1.1)
RV LATERAL S' VELOCITY: 9.57 cm/s
Stroke v: 31 ml
TAPSE: 14.6 mm

## 2016-12-17 NOTE — Progress Notes (Signed)
*  PRELIMINARY RESULTS* Echocardiogram 2D Echocardiogram has been performed.  Samuel Germany 12/17/2016, 1:00 PM

## 2016-12-20 ENCOUNTER — Ambulatory Visit (INDEPENDENT_AMBULATORY_CARE_PROVIDER_SITE_OTHER): Payer: PRIVATE HEALTH INSURANCE

## 2016-12-20 DIAGNOSIS — Z23 Encounter for immunization: Secondary | ICD-10-CM | POA: Diagnosis not present

## 2016-12-21 ENCOUNTER — Ambulatory Visit (INDEPENDENT_AMBULATORY_CARE_PROVIDER_SITE_OTHER): Payer: PRIVATE HEALTH INSURANCE

## 2016-12-21 DIAGNOSIS — Z23 Encounter for immunization: Secondary | ICD-10-CM | POA: Diagnosis not present

## 2016-12-21 DIAGNOSIS — J439 Emphysema, unspecified: Secondary | ICD-10-CM

## 2016-12-26 ENCOUNTER — Encounter (HOSPITAL_COMMUNITY): Payer: PRIVATE HEALTH INSURANCE | Attending: Hematology & Oncology

## 2016-12-26 ENCOUNTER — Other Ambulatory Visit: Payer: Self-pay

## 2016-12-26 ENCOUNTER — Encounter (HOSPITAL_COMMUNITY): Payer: Self-pay

## 2016-12-26 VITALS — BP 113/59 | HR 78 | Temp 98.4°F | Wt 151.5 lb

## 2016-12-26 DIAGNOSIS — C50912 Malignant neoplasm of unspecified site of left female breast: Secondary | ICD-10-CM

## 2016-12-26 DIAGNOSIS — C50919 Malignant neoplasm of unspecified site of unspecified female breast: Secondary | ICD-10-CM | POA: Insufficient documentation

## 2016-12-26 DIAGNOSIS — C7951 Secondary malignant neoplasm of bone: Secondary | ICD-10-CM | POA: Insufficient documentation

## 2016-12-26 DIAGNOSIS — C787 Secondary malignant neoplasm of liver and intrahepatic bile duct: Secondary | ICD-10-CM | POA: Diagnosis not present

## 2016-12-26 DIAGNOSIS — Z5112 Encounter for antineoplastic immunotherapy: Secondary | ICD-10-CM | POA: Diagnosis not present

## 2016-12-26 DIAGNOSIS — E559 Vitamin D deficiency, unspecified: Secondary | ICD-10-CM

## 2016-12-26 LAB — CBC WITH DIFFERENTIAL/PLATELET
BASOS PCT: 0 %
Basophils Absolute: 0 10*3/uL (ref 0.0–0.1)
EOS ABS: 0.3 10*3/uL (ref 0.0–0.7)
Eosinophils Relative: 2 %
HCT: 36.6 % (ref 36.0–46.0)
Hemoglobin: 10.6 g/dL — ABNORMAL LOW (ref 12.0–15.0)
Lymphocytes Relative: 18 %
Lymphs Abs: 2 10*3/uL (ref 0.7–4.0)
MCH: 26 pg (ref 26.0–34.0)
MCHC: 29 g/dL — AB (ref 30.0–36.0)
MCV: 89.7 fL (ref 78.0–100.0)
MONO ABS: 1.1 10*3/uL — AB (ref 0.1–1.0)
MONOS PCT: 10 %
Neutro Abs: 7.8 10*3/uL — ABNORMAL HIGH (ref 1.7–7.7)
Neutrophils Relative %: 70 %
PLATELETS: 264 10*3/uL (ref 150–400)
RBC: 4.08 MIL/uL (ref 3.87–5.11)
RDW: 17.5 % — AB (ref 11.5–15.5)
WBC: 11.2 10*3/uL — ABNORMAL HIGH (ref 4.0–10.5)

## 2016-12-26 LAB — COMPREHENSIVE METABOLIC PANEL
ALBUMIN: 3.5 g/dL (ref 3.5–5.0)
ALK PHOS: 109 U/L (ref 38–126)
ALT: 48 U/L (ref 14–54)
AST: 45 U/L — ABNORMAL HIGH (ref 15–41)
Anion gap: 9 (ref 5–15)
BUN: 15 mg/dL (ref 6–20)
CALCIUM: 9.6 mg/dL (ref 8.9–10.3)
CO2: 37 mmol/L — AB (ref 22–32)
CREATININE: 0.64 mg/dL (ref 0.44–1.00)
Chloride: 90 mmol/L — ABNORMAL LOW (ref 101–111)
GFR calc non Af Amer: >60 mL/min (ref >60)
GLUCOSE: 137 mg/dL — AB (ref 65–99)
Potassium: 3.3 mmol/L — ABNORMAL LOW (ref 3.5–5.1)
SODIUM: 136 mmol/L (ref 135–145)
Total Bilirubin: 0.3 mg/dL (ref 0.3–1.2)
Total Protein: 7.2 g/dL (ref 6.5–8.1)

## 2016-12-26 MED ORDER — DIPHENHYDRAMINE HCL 25 MG PO CAPS
50.0000 mg | ORAL_CAPSULE | Freq: Once | ORAL | Status: DC
Start: 2016-12-26 — End: 2016-12-26

## 2016-12-26 MED ORDER — HEPARIN SOD (PORK) LOCK FLUSH 100 UNIT/ML IV SOLN
500.0000 [IU] | Freq: Once | INTRAVENOUS | Status: AC | PRN
  Administered 2016-12-26: 500 [IU]

## 2016-12-26 MED ORDER — ACETAMINOPHEN 325 MG PO TABS: 650.0000 mg | ORAL_TABLET | Freq: Once | ORAL | Status: DC

## 2016-12-26 MED ORDER — DENOSUMAB 120 MG/1.7ML ~~LOC~~ SOLN
120.0000 mg | Freq: Once | SUBCUTANEOUS | Status: AC
  Administered 2016-12-26: 120 mg via SUBCUTANEOUS
  Filled 2016-12-26: qty 1.7

## 2016-12-26 MED ORDER — SODIUM CHLORIDE 0.9 % IV SOLN
3.9000 mg/kg | Freq: Once | INTRAVENOUS | Status: AC
  Administered 2016-12-26: 260 mg via INTRAVENOUS
  Filled 2016-12-26: qty 8

## 2016-12-26 MED ORDER — SODIUM CHLORIDE 0.9 % IV SOLN
Freq: Once | INTRAVENOUS | Status: AC
  Administered 2016-12-26: 12:00:00 via INTRAVENOUS

## 2016-12-26 NOTE — Progress Notes (Signed)
Sheila Garrison will be due tomorrow, 12/27/16, but the clinic will be closed d/t the Thanksgiving holiday.  Okay per Dr. Talbert Cage to administer Sheila Garrison today.   Tolerated tx w/o adverse reaction.  Alert, in no distress.  VSS.  Discharged via wheelchair in c/o family.

## 2016-12-27 LAB — CANCER ANTIGEN 27.29: CA 27.29: 29.7 U/mL (ref 0.0–38.6)

## 2016-12-27 LAB — VITAMIN D 25 HYDROXY (VIT D DEFICIENCY, FRACTURES): VIT D 25 HYDROXY: 45.4 ng/mL (ref 30.0–100.0)

## 2016-12-27 LAB — CANCER ANTIGEN 15-3: CA 15-3: 23 U/mL (ref 0.0–25.0)

## 2016-12-31 ENCOUNTER — Other Ambulatory Visit (HOSPITAL_COMMUNITY): Payer: PRIVATE HEALTH INSURANCE

## 2016-12-31 ENCOUNTER — Ambulatory Visit (HOSPITAL_COMMUNITY): Payer: PRIVATE HEALTH INSURANCE

## 2017-01-02 ENCOUNTER — Ambulatory Visit (HOSPITAL_COMMUNITY): Payer: PRIVATE HEALTH INSURANCE

## 2017-01-02 ENCOUNTER — Other Ambulatory Visit (HOSPITAL_COMMUNITY): Payer: PRIVATE HEALTH INSURANCE

## 2017-01-08 ENCOUNTER — Telehealth (HOSPITAL_COMMUNITY): Payer: Self-pay

## 2017-01-08 DIAGNOSIS — F411 Generalized anxiety disorder: Secondary | ICD-10-CM

## 2017-01-08 MED ORDER — LORAZEPAM 0.5 MG PO TABS: 0.5000 mg | ORAL_TABLET | Freq: Three times a day (TID) | ORAL | 2 refills | Status: DC

## 2017-01-08 NOTE — Telephone Encounter (Signed)
-----   Message from Veronda Prude sent at 01/08/2017  2:36 PM EST ----- Patient is requesting a refill on lorazepam to [facility] in [place].

## 2017-01-08 NOTE — Telephone Encounter (Signed)
Received refill request from patient for lorazepam. Reviewed with provider, chart checked and refilled.

## 2017-01-11 ENCOUNTER — Other Ambulatory Visit: Payer: Self-pay | Admitting: Family

## 2017-01-11 DIAGNOSIS — E876 Hypokalemia: Secondary | ICD-10-CM

## 2017-01-16 ENCOUNTER — Encounter (HOSPITAL_COMMUNITY): Payer: PRIVATE HEALTH INSURANCE | Attending: Hematology & Oncology

## 2017-01-16 ENCOUNTER — Encounter (HOSPITAL_BASED_OUTPATIENT_CLINIC_OR_DEPARTMENT_OTHER): Payer: PRIVATE HEALTH INSURANCE | Admitting: Oncology

## 2017-01-16 ENCOUNTER — Encounter (HOSPITAL_COMMUNITY): Payer: Self-pay

## 2017-01-16 VITALS — BP 106/44 | HR 71 | Resp 16

## 2017-01-16 VITALS — BP 125/70 | HR 88 | Temp 98.4°F | Resp 20 | Wt 152.0 lb

## 2017-01-16 DIAGNOSIS — C787 Secondary malignant neoplasm of liver and intrahepatic bile duct: Secondary | ICD-10-CM

## 2017-01-16 DIAGNOSIS — C50919 Malignant neoplasm of unspecified site of unspecified female breast: Secondary | ICD-10-CM | POA: Insufficient documentation

## 2017-01-16 DIAGNOSIS — C7951 Secondary malignant neoplasm of bone: Secondary | ICD-10-CM | POA: Insufficient documentation

## 2017-01-16 DIAGNOSIS — R0602 Shortness of breath: Secondary | ICD-10-CM | POA: Diagnosis not present

## 2017-01-16 DIAGNOSIS — C50912 Malignant neoplasm of unspecified site of left female breast: Secondary | ICD-10-CM | POA: Diagnosis not present

## 2017-01-16 DIAGNOSIS — Z5112 Encounter for antineoplastic immunotherapy: Secondary | ICD-10-CM

## 2017-01-16 DIAGNOSIS — G629 Polyneuropathy, unspecified: Secondary | ICD-10-CM | POA: Diagnosis not present

## 2017-01-16 LAB — CBC WITH DIFFERENTIAL/PLATELET
BASOS ABS: 0 10*3/uL (ref 0.0–0.1)
BASOS PCT: 0 %
Eosinophils Absolute: 0.3 10*3/uL (ref 0.0–0.7)
Eosinophils Relative: 3 %
HEMATOCRIT: 38.3 % (ref 36.0–46.0)
Hemoglobin: 11 g/dL — ABNORMAL LOW (ref 12.0–15.0)
Lymphocytes Relative: 18 %
Lymphs Abs: 1.9 10*3/uL (ref 0.7–4.0)
MCH: 25.7 pg — ABNORMAL LOW (ref 26.0–34.0)
MCHC: 28.7 g/dL — AB (ref 30.0–36.0)
MCV: 89.5 fL (ref 78.0–100.0)
MONO ABS: 0.9 10*3/uL (ref 0.1–1.0)
MONOS PCT: 8 %
NEUTROS ABS: 7.3 10*3/uL (ref 1.7–7.7)
Neutrophils Relative %: 71 %
Platelets: 275 10*3/uL (ref 150–400)
RBC: 4.28 MIL/uL (ref 3.87–5.11)
RDW: 17.7 % — ABNORMAL HIGH (ref 11.5–15.5)
WBC: 10.4 10*3/uL (ref 4.0–10.5)

## 2017-01-16 LAB — COMPREHENSIVE METABOLIC PANEL
ALBUMIN: 3.6 g/dL (ref 3.5–5.0)
ALK PHOS: 103 U/L (ref 38–126)
ALT: 35 U/L (ref 14–54)
ANION GAP: 9 (ref 5–15)
AST: 36 U/L (ref 15–41)
BILIRUBIN TOTAL: 0.4 mg/dL (ref 0.3–1.2)
BUN: 17 mg/dL (ref 6–20)
CALCIUM: 9.4 mg/dL (ref 8.9–10.3)
CO2: 35 mmol/L — ABNORMAL HIGH (ref 22–32)
Chloride: 92 mmol/L — ABNORMAL LOW (ref 101–111)
Creatinine, Ser: 0.6 mg/dL (ref 0.44–1.00)
GFR calc non Af Amer: >60 mL/min (ref >60)
GLUCOSE: 116 mg/dL — AB (ref 65–99)
POTASSIUM: 3.8 mmol/L (ref 3.5–5.1)
Sodium: 136 mmol/L (ref 135–145)
Total Protein: 7.7 g/dL (ref 6.5–8.1)

## 2017-01-16 MED ORDER — SODIUM CHLORIDE 0.9 % IV SOLN
3.9000 mg/kg | Freq: Once | INTRAVENOUS | Status: AC
  Administered 2017-01-16: 260 mg via INTRAVENOUS
  Filled 2017-01-16: qty 8

## 2017-01-16 MED ORDER — HEPARIN SOD (PORK) LOCK FLUSH 100 UNIT/ML IV SOLN
500.0000 [IU] | Freq: Once | INTRAVENOUS | Status: AC | PRN
  Administered 2017-01-16: 500 [IU]

## 2017-01-16 MED ORDER — HEPARIN SOD (PORK) LOCK FLUSH 100 UNIT/ML IV SOLN
INTRAVENOUS | Status: AC
  Filled 2017-01-16: qty 5

## 2017-01-16 MED ORDER — SODIUM CHLORIDE 0.9% FLUSH
10.0000 mL | INTRAVENOUS | Status: DC | PRN
  Administered 2017-01-16: 10 mL
  Filled 2017-01-16: qty 10

## 2017-01-16 MED ORDER — ACETAMINOPHEN 325 MG PO TABS: 650.0000 mg | ORAL_TABLET | Freq: Once | ORAL | Status: DC

## 2017-01-16 MED ORDER — SODIUM CHLORIDE 0.9 % IV SOLN
Freq: Once | INTRAVENOUS | Status: AC
  Administered 2017-01-16: 12:00:00 via INTRAVENOUS

## 2017-01-16 MED ORDER — DIPHENHYDRAMINE HCL 25 MG PO CAPS: 50.0000 mg | ORAL_CAPSULE | Freq: Once | ORAL | Status: DC

## 2017-01-16 NOTE — Patient Instructions (Signed)
[place] at [facility]  Discharge Instructions:  You were seen by dr. Talbert Cage today _______________________________________________________________  Thank you for choosing [place] at [facility] to provide your oncology and hematology care.  To afford each patient quality time with our providers, please arrive at least 15 minutes before your scheduled appointment.  You need to re-schedule your appointment if you arrive 10 or more minutes late.  We strive to give you quality time with our providers, and arriving late affects you and other patients whose appointments are after yours.  Also, if you no show three or more times for appointments you may be dismissed from the clinic.  Again, thank you for choosing [place] at [facility] hope is that these requests will allow you access to exceptional care and in a timely manner. _______________________________________________________________  If you have questions after your visit, please contact our office at [phone] between the hours of 8:30 a.m. and 5:00 p.m. Voicemails left after 4:30 p.m. will not be returned until the following business day. _______________________________________________________________  For prescription refill requests, have your pharmacy contact our office. _______________________________________________________________  Recommendations made by the consultant and any test results will be sent to your referring physician. _______________________________________________________________

## 2017-01-16 NOTE — Progress Notes (Signed)
Sheila Garrison, [place] [address]  Progress Note  Breast Cancer  CURRENT THERAPY: Docetaxel/Herceptin/Perjeta beginning on 03/06/2016.  Docetaxel dose reduced by 20% beginning on 03/27/2016.  INTERVAL HISTORY: KORA GROOM 61 y.o. 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 AND Invasive lobular left breast cancer, Stage IIB (pT3pN2A), S/P left mastectomy by Dr. Arnoldo Morale on 12/16/2015, ER/PR+ 2%.  HER2 NEGATIVE.  Done prior to knowing about Stage IV disease. 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 Margot Chimes, systemic chemotherapy by Dr. Jana Hakim in 2012 and Herceptin x 52 weeks finishing on 07/10/2011, XRT (01/04/2011- 02/22/2011), and anti-endocrine therapy.    Stage III (T4N3) R Br cancer    06/21/2010 Initial Diagnosis    Stage III (T4N3) R Br cancer       01/04/2011 - 02/22/2011 Radiation Therapy    50 Gy       05/07/2014 Pathology Results    BCI testing- low risk of late recurrence (3.4% between years 5-10), high likelihood of benefit from extended endocrein therapy, and a 67% relative risk reduction when treated with extended endocrine therapy (27% versus 10.5%).       [name], stage 4, left ([place])   11/22/2015 Mammogram    Ultrasound-guided biopsy of the irregular hypoechoic area, with prominent shadowing, in the OUTER left breast at the 1 o'clock axis. 2. Ultrasound-guided biopsy of the irregular mass within the INNER left breast at the 9 o'clock axis. 3. Ultrasound-guided biopsy of 1 of the enlarged and amorphous lymph nodes in the left axilla.      11/22/2015 Imaging    Ultrasound-guided biopsy of the irregular hypoechoic area, with prominent shadowing, in the OUTER left breast at the 1 o'clock axis. 2. Ultrasound-guided biopsy of  the irregular mass within the INNER left breast at the 9 o'clock axis. 3. Ultrasound-guided biopsy of 1 of the enlarged and amorphous lymph nodes in the left axilla.      12/16/2015 Surgery    L modified radical mastectomy with Dr. Arnoldo Morale      12/18/2015 - 12/21/2015 Hospital Admission    Admit date: 12/18/2015 Admission diagnosis: altered MS, acute respiratory failure Additional comments: This likely was a combination of COPD exacerbation along with mild acute on chronic diastolic CHF with  echo shows a preserved EF of around 60%, he was treated with IV steroids and Lasix, she is much better but qualifies for home oxygen which will be provided      12/19/2015 Imaging    No evidence of pulmonary embolus. 2. Small right pleural effusion, with associated atelectasis. Peripheral scarring at the anterior right upper lobe. Mild bilateral emphysema noted. 3. Scattered coronary artery calcifications seen. 4. Postoperative change at the left chest wall, reflecting recent mastectomy, with vague collections of postoperative fluid seen. 5. Vague sclerotic change and heterogeneity at vertebral body T8 raises concern for sequelae of metastatic disease      01/23/2016 PET scan    1. Widespread multifocal metastatic disease in the visualized axial and appendicular skeleton. 2. Solitary metastatic lesion in the liver. Hypermetabolic irregular left axillary lymph node adjacent to the left axillary clips. 3. Activity in the left breast related to mastectomy. 4. Low-grade activity along the pleuroparenchymal thickening anteriorly in the right chest is probably related to prior therapy rather than  current malignancy. 5. Probable mild compression fracture at T8 associated with the extensive tumor at this level. There is also bony destruction of the right ninth rib posteriorly. 6. Faintly hypermetabolic but small left external iliac lymph nodes merit surveillance  ADDENDUM: The original report  was by Dr. Van Clines. The following addendum is by Dr. Van Clines:  I have one other musculoskeletal focus of hypermetabolic activity to mention. In the right latissimus dorsi muscle, there is a small hypermetabolic focus with maximum SUV of 5.4, measuring 7 mm in diameter on image 89/4, compatible with a metastatic deposit.        02/02/2016 Imaging    MRI T-spine: 1. Extensive metastatic disease throughout the cervical and thoracic spine with a subtle pathologic compression fracture of T8. Tumor extends through the posterior margin of the T8 vertebra into the spinal canal without spinal cord compression at this time. 2. No other pathologic fractures of the thoracic spine at this time.      02/10/2016 Procedure    US biopsy of liver lesion      02/15/2016 Pathology Results    Liver, needle/core biopsy, right lobe METASTATIC CARCINOMA, CONSISTENT WITH BREAST DUCTAL CARCINOMA.      02/15/2016 - 02/28/2016 Chemotherapy    Letrozole 2.5 mg daily + Kisqali (600 mg) days 1-21 every 28 days       02/21/2016 Pathology Results    Breast Prognostic profile from liver biopsy: HER2 POSITIVE, ER/PR NEGATIVE (0%).      02/27/2016 - 03/09/2016 Radiation Therapy    Palliative XRT to T7-T11 with Dr. Lianne Cure; 30 Gy in 10 fractions.       02/28/2016 Treatment Plan Change    Due to HER2 positivity and ER/PR negativity from liver biopsy, change to systemic chemotherapy is recommended.      03/06/2016 -  Chemotherapy    Docetaxel/Herceptin/Perjeta with Neulasta support.      03/11/2016 - 03/15/2016 Hospital Admission    Admit date: 03/11/2016 Admission diagnosis: Febrile neutropenia Additional comments: COPD exacerbation      03/27/2016 Treatment Plan Change    Docetaxel dose reduced by 20%.      03/27/2016 Adverse Reaction    Demonstrating early signs of palmar-plantar erythrodysesthesia.      03/27/2016 Treatment Plan Change    Defer treatment 1 week.      04/10/2016 Imaging     MUGA- The left ventricular ejection fraction is equal to 56.2%. Normal left ventricular wall motion.      06/04/2016 PET scan    1. No residual hypermetabolism within previously hypermetabolic lesions in the left axilla, right latissimus dorsi muscle, anterior right pleural space, liver, left external iliac lymph node and bones. 2. Aortic atherosclerosis (ICD10-170.0). Coronary artery calcification.      07/25/2016 Treatment Plan Change    Started Nerlynx      09/03/2016 PET scan     Interval development of small to moderate right pleural effusion associated with a linear band of hypermetabolism in the posterior right costophrenic sulcus ( SUV max = 5.7) . Imaging features are concerning for recurrent posterior right pleural disease.      09/06/2016 Treatment Plan Change    Stopped Nerlyn due to evidence of recurrent posterior right pleural disease.       09/11/2016 Treatment Plan Change    Started Kadcyla      11/30/2016 PET scan    IMPRESSION: 1. Reduction in volume of RIGHT pleural effusion and resolution of metabolic activity RIGHT at lung base most  consistent with resolving inflammatory or infectious process. 2. No evidence disease progression. 3. Multiple sclerotic skeletal metastasis throughout the skeleton. Several foci of mild metabolic activity interspersed. No significant change.        Barabara Motz presents to the clinic for continuing follow up with her friend. She is here for cycle 7 of Kadcyla. She has been tolerating her treatments well. She has been gaining weight and states her appetite has improved a lot. She has her chronic neuropathy which has not worsened. Shortness of breath is at baseline. She continues to to use 4 L of oxygen via nasal cannula. She denies any abdominal pain, chest pain, headache, bone pain, nausea, vomiting, diarrhea. She continues to perform all her ADLs without any difficulty.  Review of Systems  Constitutional: Positive for  malaise/fatigue. Negative for chills, fever and weight loss.  HENT: Negative for hearing loss, sore throat and tinnitus.   Eyes: Negative.  Negative for blurred vision, photophobia and discharge.  Respiratory: Positive for shortness of breath (chronic at baseline). Negative for cough, hemoptysis and wheezing.   Cardiovascular: Negative.  Negative for chest pain, palpitations, orthopnea, claudication and leg swelling.  Gastrointestinal: Negative for abdominal pain, constipation, diarrhea, melena, nausea and vomiting.  Genitourinary: Negative.  Negative for dysuria and hematuria.  Musculoskeletal: Negative for back pain, joint pain, myalgias and neck pain.  Skin: Negative.  Negative for itching and rash.  Neurological: Negative for dizziness, tingling, weakness and headaches.  Endo/Heme/Allergies: Negative.  Negative for environmental allergies and polydipsia. Does not bruise/bleed easily.  Psychiatric/Behavioral: Negative.  Negative for depression. The patient is not nervous/anxious and does not have insomnia.     Past Medical History:  Diagnosis Date  . Anxiety   . Bone metastases ([place]) 02/15/2016  . Breast CA ([place]) 07/01/2010   rt breast ca  . COPD (chronic obstructive pulmonary disease) ([place])   . Family history of breast cancer    Aunt on father's side  . Hyperlipemia   . Hypertension   . MRSA (methicillin resistant staph aureus) culture positive    in breast  . Neuropathy    bilateral toes  . Nipple discharge    with pain, infection, lump  . S/P radiation therapy 01/08/11 - 02/22/11   Right Chest Wall/ 50 Gy / 25 Fractions, Right [place]/ 50 gy/25 Fractions, Right Axillary Boost/500 cGy/25 Fractions, Right Chest Wall Boost/10 Gy/5 Fractions  . Stage III (T4N3) R Br cancer  06/21/2010   Letrozole 2.5 mg which she started on 03/09/2011.  This patient presented to my office on Jun 21, 2010 with a large ulcerated right breast mass and bleeding. She underwent additional workup at that  time and a biopsy was done showing invasive breast cancer. She was staged and was found to have a T4  disease that was ER, PR, HER-2 positive. CA 2729 was normal at 31. She is undergoing chemotherapy wi  . Status post chemotherapy    6 cycles of carboplatin and docetaxel with trastuzumab every 21 days followed by surgery  . Tobacco abuse 06/22/2015  . Use of letrozole ([place]) 03/09/2011  . Wears dentures     Past Surgical History:  Procedure Laterality Date  . BREAST SURGERY  2012  . DILATION AND CURETTAGE OF UTERUS    . MASTECTOMY MODIFIED RADICAL Left 12/16/2015   Procedure: MASTECTOMY MODIFIED RADICAL;  Surgeon: Aviva Signs, MD;  Location: [facility];  Service: General;  Laterality: Left;  . port a catheter insertion    . PORT-A-CATH REMOVAL  Left 01/15/2013   Procedure: REMOVAL PORT-A-CATH;  Surgeon: Scherry Ran, MD;  Location: [facility];  Service: General;  Laterality: Left;  . PORTACATH PLACEMENT Right 01/16/2016   Procedure: INSERTION PORT-A-CATH;  Surgeon: Aviva Signs, MD;  Location: [facility];  Service: General;  Laterality: Right;  . TUBAL LIGATION      Family History  Problem Relation Age of Onset  . COPD Mother   . Diabetes Father   . Hypertension Father   . COPD Brother   . Cancer Maternal Aunt        ovarian - died of old age  . Cancer Paternal Aunt        had breast ca; died of of a different cancer  . Lung cancer Brother     Social History   Socioeconomic History  . Marital status: Married    Spouse name: None  . Number of children: None  . Years of education: None  . Highest education level: None  Social Needs  . Financial resource strain: None  . Food insecurity - worry: None  . Food insecurity - inability: None  . Transportation needs - medical: None  . Transportation needs - non-medical: None  Occupational History  . None  Tobacco Use  . Smoking status: Former Smoker    Packs/day: 0.25    Years: 30.00    Pack years: 7.50    Types: Cigarettes     Last attempt to quit: 12/15/2015    Years since quitting: 1.0  . Smokeless tobacco: Never Used  Substance and Sexual Activity  . Alcohol use: No  . Drug use: No  . Sexual activity: Yes    Birth control/protection: Post-menopausal  Other Topics Concern  . None  Social History Narrative  . None     PHYSICAL EXAMINATION  ECOG PERFORMANCE STATUS: 1 - Symptomatic but completely ambulatory RN vitals reviewed  Physical Exam  Constitutional: She is oriented to person, place, and time and well-developed, well-nourished, and in no distress. No distress.  HENT:  Head: Normocephalic and atraumatic.  Mouth/Throat: No oropharyngeal exudate.  Eyes: Conjunctivae and EOM are normal. Pupils are equal, round, and reactive to light. No scleral icterus.  Neck: Normal range of motion. Neck supple. No JVD present.  Cardiovascular: Normal rate, regular rhythm and normal heart sounds. Exam reveals no gallop and no friction rub.  No murmur heard. Pulmonary/Chest: Effort normal and breath sounds normal. No respiratory distress. She has no wheezes. She has no rales.  Abdominal: Soft. Bowel sounds are normal. She exhibits no distension. There is no tenderness. There is no guarding.  Musculoskeletal: Normal range of motion. She exhibits no edema or tenderness.  Lymphadenopathy:    She has no cervical adenopathy.  Neurological: She is alert and oriented to person, place, and time. No cranial nerve deficit. Gait normal.  Skin: Skin is warm and dry. No rash noted. No erythema. No pallor.  Psychiatric: Affect and judgment normal.  Nursing note and vitals reviewed.    LABORATORY DATA: CBC    Component Value Date/Time   WBC 11.2 (H) 12/26/2016 1100   RBC 4.08 12/26/2016 1100   HGB 10.6 (L) 12/26/2016 1100   HGB 13.5 12/27/2015 1553   HGB 10.1 (L) 11/07/2010 0917   HCT 36.6 12/26/2016 1100   HCT 41.3 12/27/2015 1553   HCT 31.1 (L) 11/07/2010 0917   PLT 264 12/26/2016 1100   PLT 406 (H) 12/27/2015 1553    MCV 89.7 12/26/2016 1100   MCV 92 12/27/2015  1553   MCV 100.3 11/07/2010 0917   MCH 26.0 12/26/2016 1100   MCHC 29.0 (L) 12/26/2016 1100   RDW 17.5 (H) 12/26/2016 1100   RDW 14.8 12/27/2015 1553   RDW 18.3 (H) 11/07/2010 0917   LYMPHSABS 2.0 12/26/2016 1100   LYMPHSABS 1.9 12/27/2015 1553   LYMPHSABS 1.6 11/07/2010 0917   MONOABS 1.1 (H) 12/26/2016 1100   MONOABS 1.4 (H) 11/07/2010 0917   EOSABS 0.3 12/26/2016 1100   EOSABS 0.2 12/27/2015 1553   BASOSABS 0.0 12/26/2016 1100   BASOSABS 0.0 12/27/2015 1553   BASOSABS 0.0 11/07/2010 0917      Chemistry      Component Value Date/Time   NA 136 12/26/2016 1100   NA 138 10/19/2016 1010   K 3.3 (L) 12/26/2016 1100   CL 90 (L) 12/26/2016 1100   CO2 37 (H) 12/26/2016 1100   BUN 15 12/26/2016 1100   BUN 14 10/19/2016 1010   CREATININE 0.64 12/26/2016 1100      Component Value Date/Time   CALCIUM 9.6 12/26/2016 1100   CALCIUM 7.2 (L) 05/15/2016 1032   ALKPHOS 109 12/26/2016 1100   AST 45 (H) 12/26/2016 1100   ALT 48 12/26/2016 1100   BILITOT 0.3 12/26/2016 1100   BILITOT <0.2 10/19/2016 1010        PENDING LABS:   RADIOGRAPHIC STUDIES: I have personally reviewed the radiological images as listed and agreed with the findings in the report.  NUCLEAR MEDICINE PET SKULL BASE TO THIGH 06/04/2016  IMPRESSION: 1. No residual hypermetabolism within previously hypermetabolic lesions in the left axilla, right latissimus dorsi muscle, anterior right pleural space, liver, left external iliac lymph node and bones. 2. Aortic atherosclerosis (ICD10-170.0). Coronary artery calcification.    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  Complicated by a LEFT invasive lobular carcinoma, Stage IIB (pT3pN2A), S/P left mastectomy by Dr. Arnoldo Morale on 12/16/2015, ER/PR+ 2%.   HER2 NEGATIVE.  Done prior to knowing about Stage IV disease. AND History with right invasive ductal breast cancer, Stage IIIC, ER/PR+ and HER 2 POSITIVE in 2012 managed with right mastectomy, systemic chemotherapy, HER2 targeted therapy, XRT, and anti-endocrine therapy   She started palliative XRT to T8 pathologic fracture on 02/27/2016.  Chemo with docetaxel/herceptin/perjeta for 6 cycles: 03/06/16-06/26/16.  PLAN: Responding well to kadcyla. Last ECHO on 09/11/16 demonstrated EF 55%. Repeat ECHO in November demonstrated EF 60-65%. Reviewed labs proceed with her treatment today. Tolerating kadcyla well. Continue kadcyla q 3 weeks.  Continue xgeva q4weeks Plan to restage with PET CT in late February 2019, to be ordered on her next visit.  RTC in 6 weeks for follow up with labs.  All questions were answered. The patient knows to call the clinic with any problems, questions or concerns. We can certainly see the patient much sooner if necessary.  This note is electronically signed by: Twana First, MD 01/16/2017 10:31 AM

## 2017-01-16 NOTE — Patient Instructions (Signed)
[facility] Discharge Instructions for Patients Receiving Chemotherapy   Beginning January 23rd 2017 lab work for the Cancer Center will be done in the  Main lab at [facility] on 1st floor. If you have a lab appointment with the Cancer Center please come in thru the  Main Entrance and check in at the main information desk   Today you received the following chemotherapy agents   To help prevent nausea and vomiting after your treatment, we encourage you to take your nausea medication     If you develop nausea and vomiting, or diarrhea that is not controlled by your medication, call the clinic.  The clinic phone number is [phone]. Office hours are Monday-Friday 8:30am-5:00pm.  BELOW ARE SYMPTOMS THAT SHOULD BE REPORTED IMMEDIATELY:  *FEVER GREATER THAN 101.0 F  *CHILLS WITH OR WITHOUT FEVER  NAUSEA AND VOMITING THAT IS NOT CONTROLLED WITH YOUR NAUSEA MEDICATION  *UNUSUAL SHORTNESS OF BREATH  *UNUSUAL BRUISING OR BLEEDING  TENDERNESS IN MOUTH AND THROAT WITH OR WITHOUT PRESENCE OF ULCERS  *URINARY PROBLEMS  *BOWEL PROBLEMS  UNUSUAL RASH Items with * indicate a potential emergency and should be followed up as soon as possible. If you have an emergency after office hours please contact your primary care physician or go to the nearest emergency department.  Please call the clinic during office hours if you have any questions or concerns.   You may also contact the Patient Navigator at [phone] should you have any questions or need assistance in obtaining follow up care.      Resources For Cancer Patients and their Caregivers ? American Cancer Society: Can assist with transportation, wigs, general needs, runs Look Good Feel Better.        1-888-227-6333 ? Cancer Care: Provides financial assistance, online support groups, medication/co-pay assistance.  1-800-813-HOPE (4673) ? [facility] Assists [place] cancer  patients and their families through emotional , educational and financial support.  336-427-4357 ? [place] DSS Where to apply for food stamps, Medicaid and utility assistance. 336-342-1394 ? [facility]: Transportation to medical appointments. 336-347-2287 ? Social Security Administration: May apply for disability if have a Stage IV cancer. 336-342-7796 1-800-772-1213 ? [place] Aging, Disability and Transit Services: Assists with nutrition, care and transit needs. 336-349-2343         

## 2017-01-16 NOTE — Progress Notes (Signed)
Labs reviewed with MD, proceed with treatment.  Treatment given per orders. Patient tolerated it well without problems. Vitals stable and discharged home from clinic ambulatory. Follow up as scheduled.  

## 2017-01-17 LAB — CANCER ANTIGEN 15-3: CAN 15 3: 24 U/mL (ref 0.0–25.0)

## 2017-01-17 LAB — CANCER ANTIGEN 27.29: CAN 27.29: 29.7 U/mL (ref 0.0–38.6)

## 2017-01-22 ENCOUNTER — Other Ambulatory Visit (HOSPITAL_COMMUNITY): Payer: Self-pay | Admitting: *Deleted

## 2017-01-23 ENCOUNTER — Encounter (HOSPITAL_COMMUNITY): Payer: PRIVATE HEALTH INSURANCE

## 2017-01-23 ENCOUNTER — Encounter (HOSPITAL_BASED_OUTPATIENT_CLINIC_OR_DEPARTMENT_OTHER): Payer: PRIVATE HEALTH INSURANCE

## 2017-01-23 VITALS — BP 105/72 | HR 108 | Temp 99.8°F | Resp 20

## 2017-01-23 DIAGNOSIS — C50912 Malignant neoplasm of unspecified site of left female breast: Secondary | ICD-10-CM | POA: Diagnosis not present

## 2017-01-23 DIAGNOSIS — C7951 Secondary malignant neoplasm of bone: Secondary | ICD-10-CM | POA: Diagnosis not present

## 2017-01-23 DIAGNOSIS — C787 Secondary malignant neoplasm of liver and intrahepatic bile duct: Secondary | ICD-10-CM

## 2017-01-23 DIAGNOSIS — C50919 Malignant neoplasm of unspecified site of unspecified female breast: Secondary | ICD-10-CM | POA: Diagnosis not present

## 2017-01-23 LAB — CBC WITH DIFFERENTIAL/PLATELET
BASOS ABS: 0 10*3/uL (ref 0.0–0.1)
BASOS PCT: 0 %
Eosinophils Absolute: 0.2 10*3/uL (ref 0.0–0.7)
Eosinophils Relative: 2 %
HEMATOCRIT: 36.7 % (ref 36.0–46.0)
HEMOGLOBIN: 11.3 g/dL — AB (ref 12.0–15.0)
LYMPHS PCT: 15 %
Lymphs Abs: 1.7 10*3/uL (ref 0.7–4.0)
MCH: 26.5 pg (ref 26.0–34.0)
MCHC: 30.8 g/dL (ref 30.0–36.0)
MCV: 86.2 fL (ref 78.0–100.0)
Monocytes Absolute: 1.3 10*3/uL — ABNORMAL HIGH (ref 0.1–1.0)
Monocytes Relative: 11 %
NEUTROS ABS: 8.6 10*3/uL (ref 1.7–7.7)
NEUTROS PCT: 72 %
Platelets: 214 10*3/uL (ref 150–400)
RBC: 4.26 MIL/uL (ref 3.87–5.11)
RDW: 17.3 % — ABNORMAL HIGH (ref 11.5–15.5)
WBC: 11.9 10*3/uL — AB (ref 4.0–10.5)

## 2017-01-23 LAB — COMPREHENSIVE METABOLIC PANEL
ALBUMIN: 3.4 g/dL — AB (ref 3.5–5.0)
ALT: 32 U/L (ref 14–54)
AST: 48 U/L — AB (ref 15–41)
Alkaline Phosphatase: 109 U/L (ref 38–126)
Anion gap: 14 (ref 5–15)
BILIRUBIN TOTAL: 0.2 mg/dL — AB (ref 0.3–1.2)
BUN: 15 mg/dL (ref 6–20)
CHLORIDE: 94 mmol/L — AB (ref 101–111)
CO2: 30 mmol/L (ref 22–32)
CREATININE: 0.75 mg/dL (ref 0.44–1.00)
Calcium: 9.7 mg/dL (ref 8.9–10.3)
GFR calc Af Amer: >60 mL/min (ref >60)
GLUCOSE: 175 mg/dL — AB (ref 65–99)
Potassium: 3.3 mmol/L — ABNORMAL LOW (ref 3.5–5.1)
Sodium: 138 mmol/L (ref 135–145)
TOTAL PROTEIN: 7.8 g/dL (ref 6.5–8.1)

## 2017-01-23 MED ORDER — DENOSUMAB 120 MG/1.7ML ~~LOC~~ SOLN
120.0000 mg | Freq: Once | SUBCUTANEOUS | Status: AC
  Administered 2017-01-23: 120 mg via SUBCUTANEOUS
  Filled 2017-01-23: qty 1.7

## 2017-01-23 NOTE — Progress Notes (Signed)
For xgeva today.  Patient denied tooth, jaw, or leg pain.  Taking calcium as directed. No recent dental visits or upcoming visits.  Friend at side.   Patient tolerated xgeva shot with no complaints voiced. Injection site clean and dry with no bruising or swelling noted at site.  Band aid applied.  VSS with discharge and left with friend with no s/s of distress noted.

## 2017-01-23 NOTE — Patient Instructions (Signed)
[facility] at [facility]  Discharge Instructions: You received an xgeva shot today.  _______________________________________________________________  Thank you for choosing [facility] at [facility] to provide your oncology and hematology care.  To afford each patient quality time with our providers, please arrive at least 15 minutes before your scheduled appointment.  You need to re-schedule your appointment if you arrive 10 or more minutes late.  We strive to give you quality time with our providers, and arriving late affects you and other patients whose appointments are after yours.  Also, if you no show three or more times for appointments you may be dismissed from the clinic.  Again, thank you for choosing  Cancer Center at [facility]. Our hope is that these requests will allow you access to exceptional care and in a timely manner. _______________________________________________________________  If you have questions after your visit, please contact our office at [phone] between the hours of 8:30 a.m. and 5:00 p.m. Voicemails left after 4:30 p.m. will not be returned until the following business day. _______________________________________________________________  For prescription refill requests, have your pharmacy contact our office. _______________________________________________________________  Recommendations made by the consultant and any test results will be sent to your referring physician. _______________________________________________________________ 

## 2017-01-24 LAB — CANCER ANTIGEN 27.29: CA 27.29: 29.3 U/mL (ref 0.0–38.6)

## 2017-01-24 LAB — CANCER ANTIGEN 15-3: CA 15-3: 24.2 U/mL (ref 0.0–25.0)

## 2017-01-30 ENCOUNTER — Other Ambulatory Visit (HOSPITAL_COMMUNITY): Payer: Self-pay | Admitting: Adult Health

## 2017-01-30 DIAGNOSIS — K208 Other esophagitis without bleeding: Secondary | ICD-10-CM

## 2017-02-06 ENCOUNTER — Encounter (HOSPITAL_COMMUNITY): Payer: Self-pay | Admitting: Hematology and Oncology

## 2017-02-06 ENCOUNTER — Inpatient Hospital Stay (HOSPITAL_COMMUNITY): Payer: Managed Care, Other (non HMO) | Attending: Hematology & Oncology

## 2017-02-06 ENCOUNTER — Other Ambulatory Visit: Payer: Self-pay

## 2017-02-06 ENCOUNTER — Inpatient Hospital Stay (HOSPITAL_BASED_OUTPATIENT_CLINIC_OR_DEPARTMENT_OTHER): Payer: Managed Care, Other (non HMO) | Admitting: Hematology and Oncology

## 2017-02-06 VITALS — BP 108/62 | HR 80 | Temp 98.8°F | Resp 20 | Wt 153.4 lb

## 2017-02-06 DIAGNOSIS — C50812 Malignant neoplasm of overlapping sites of left female breast: Secondary | ICD-10-CM | POA: Diagnosis not present

## 2017-02-06 DIAGNOSIS — Z79811 Long term (current) use of aromatase inhibitors: Secondary | ICD-10-CM | POA: Diagnosis not present

## 2017-02-06 DIAGNOSIS — G629 Polyneuropathy, unspecified: Secondary | ICD-10-CM | POA: Diagnosis not present

## 2017-02-06 DIAGNOSIS — Z801 Family history of malignant neoplasm of trachea, bronchus and lung: Secondary | ICD-10-CM | POA: Insufficient documentation

## 2017-02-06 DIAGNOSIS — E785 Hyperlipidemia, unspecified: Secondary | ICD-10-CM | POA: Insufficient documentation

## 2017-02-06 DIAGNOSIS — C787 Secondary malignant neoplasm of liver and intrahepatic bile duct: Secondary | ICD-10-CM | POA: Diagnosis not present

## 2017-02-06 DIAGNOSIS — Z9012 Acquired absence of left breast and nipple: Secondary | ICD-10-CM | POA: Diagnosis not present

## 2017-02-06 DIAGNOSIS — Z87891 Personal history of nicotine dependence: Secondary | ICD-10-CM | POA: Diagnosis not present

## 2017-02-06 DIAGNOSIS — C50912 Malignant neoplasm of unspecified site of left female breast: Secondary | ICD-10-CM | POA: Diagnosis not present

## 2017-02-06 DIAGNOSIS — Z5112 Encounter for antineoplastic immunotherapy: Secondary | ICD-10-CM | POA: Insufficient documentation

## 2017-02-06 DIAGNOSIS — I5033 Acute on chronic diastolic (congestive) heart failure: Secondary | ICD-10-CM | POA: Insufficient documentation

## 2017-02-06 DIAGNOSIS — F17210 Nicotine dependence, cigarettes, uncomplicated: Secondary | ICD-10-CM | POA: Diagnosis not present

## 2017-02-06 DIAGNOSIS — E876 Hypokalemia: Secondary | ICD-10-CM

## 2017-02-06 DIAGNOSIS — A4102 Sepsis due to Methicillin resistant Staphylococcus aureus: Secondary | ICD-10-CM | POA: Diagnosis not present

## 2017-02-06 DIAGNOSIS — G35 Multiple sclerosis: Secondary | ICD-10-CM | POA: Diagnosis not present

## 2017-02-06 DIAGNOSIS — I10 Essential (primary) hypertension: Secondary | ICD-10-CM | POA: Diagnosis not present

## 2017-02-06 DIAGNOSIS — C7951 Secondary malignant neoplasm of bone: Secondary | ICD-10-CM | POA: Diagnosis not present

## 2017-02-06 DIAGNOSIS — J449 Chronic obstructive pulmonary disease, unspecified: Secondary | ICD-10-CM | POA: Insufficient documentation

## 2017-02-06 DIAGNOSIS — I700 Atherosclerosis of aorta: Secondary | ICD-10-CM | POA: Insufficient documentation

## 2017-02-06 DIAGNOSIS — F419 Anxiety disorder, unspecified: Secondary | ICD-10-CM | POA: Insufficient documentation

## 2017-02-06 DIAGNOSIS — Z923 Personal history of irradiation: Secondary | ICD-10-CM | POA: Diagnosis not present

## 2017-02-06 DIAGNOSIS — Z8041 Family history of malignant neoplasm of ovary: Secondary | ICD-10-CM | POA: Diagnosis not present

## 2017-02-06 DIAGNOSIS — R918 Other nonspecific abnormal finding of lung field: Secondary | ICD-10-CM | POA: Insufficient documentation

## 2017-02-06 LAB — COMPREHENSIVE METABOLIC PANEL
ALK PHOS: 94 U/L (ref 38–126)
ALT: 35 U/L (ref 14–54)
AST: 39 U/L (ref 15–41)
Albumin: 3.4 g/dL — ABNORMAL LOW (ref 3.5–5.0)
Anion gap: 15 (ref 5–15)
BILIRUBIN TOTAL: 0.4 mg/dL (ref 0.3–1.2)
BUN: 20 mg/dL (ref 6–20)
CO2: 32 mmol/L (ref 22–32)
CREATININE: 0.55 mg/dL (ref 0.44–1.00)
Calcium: 9.5 mg/dL (ref 8.9–10.3)
Chloride: 90 mmol/L — ABNORMAL LOW (ref 101–111)
GFR calc Af Amer: >60 mL/min (ref >60)
GFR calc non Af Amer: >60 mL/min (ref >60)
Glucose, Bld: 98 mg/dL (ref 65–99)
Potassium: 3.3 mmol/L — ABNORMAL LOW (ref 3.5–5.1)
Sodium: 137 mmol/L (ref 135–145)
TOTAL PROTEIN: 7.4 g/dL (ref 6.5–8.1)

## 2017-02-06 LAB — CBC WITH DIFFERENTIAL/PLATELET
Basophils Absolute: 0 10*3/uL (ref 0.0–0.1)
Basophils Relative: 0 %
Eosinophils Absolute: 0.2 10*3/uL (ref 0.0–0.7)
Eosinophils Relative: 2 %
HCT: 35.9 % — ABNORMAL LOW (ref 36.0–46.0)
Hemoglobin: 10.3 g/dL — ABNORMAL LOW (ref 12.0–15.0)
LYMPHS ABS: 1.7 10*3/uL (ref 0.7–4.0)
Lymphocytes Relative: 15 %
MCH: 25 pg — ABNORMAL LOW (ref 26.0–34.0)
MCHC: 28.7 g/dL — ABNORMAL LOW (ref 30.0–36.0)
MCV: 87.1 fL (ref 78.0–100.0)
MONO ABS: 1.2 10*3/uL — AB (ref 0.1–1.0)
Monocytes Relative: 10 %
Neutro Abs: 8.5 10*3/uL — ABNORMAL HIGH (ref 1.7–7.7)
Neutrophils Relative %: 73 %
PLATELETS: 294 10*3/uL (ref 150–400)
RBC: 4.12 MIL/uL (ref 3.87–5.11)
RDW: 17.2 % — AB (ref 11.5–15.5)
WBC: 11.6 10*3/uL — AB (ref 4.0–10.5)

## 2017-02-06 MED ORDER — SODIUM CHLORIDE 0.9 % IV SOLN
Freq: Once | INTRAVENOUS | Status: AC
  Administered 2017-02-06: 13:00:00 via INTRAVENOUS

## 2017-02-06 MED ORDER — SODIUM CHLORIDE 0.9 % IV SOLN
3.9000 mg/kg | Freq: Once | INTRAVENOUS | Status: AC
  Administered 2017-02-06: 260 mg via INTRAVENOUS
  Filled 2017-02-06: qty 8

## 2017-02-06 MED ORDER — ACETAMINOPHEN 325 MG PO TABS: 650.0000 mg | ORAL_TABLET | Freq: Once | ORAL | Status: DC

## 2017-02-06 MED ORDER — DIPHENHYDRAMINE HCL 25 MG PO CAPS: 50.0000 mg | ORAL_CAPSULE | Freq: Once | ORAL | Status: DC

## 2017-02-06 MED ORDER — POTASSIUM CHLORIDE CRYS ER 20 MEQ PO TBCR: 20.0000 meq | EXTENDED_RELEASE_TABLET | Freq: Three times a day (TID) | ORAL | 0 refills | Status: DC

## 2017-02-06 MED ORDER — HEPARIN SOD (PORK) LOCK FLUSH 100 UNIT/ML IV SOLN
500.0000 [IU] | Freq: Once | INTRAVENOUS | Status: AC | PRN
  Administered 2017-02-06: 500 [IU]

## 2017-02-06 NOTE — Progress Notes (Signed)
Tolerated infusion w/o adverse reaction.  Alert, in no distress.  VSS.  Discharged via wheelchair in c/o friend.

## 2017-02-07 LAB — CANCER ANTIGEN 27.29: CAN 27.29: 32.8 U/mL (ref 0.0–38.6)

## 2017-02-07 LAB — CANCER ANTIGEN 15-3: CAN 15 3: 24.7 U/mL (ref 0.0–25.0)

## 2017-02-15 NOTE — Progress Notes (Signed)
[facility] Follow-up Visit:  Assessment: [name], stage 4, left ([place]) 62 y.o. 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  Receiving therapy with ado-trastuzumab (Kadcyla) and tolerating it quite well.  Clinical evaluation lab work today permissive to proceed with the next cycle of therapy.  Plan: -- Proceed with cycle #8 of ado-trastuzumab (Kadcyla) today. --Return to infusion in 3 weeks for the next dose of ado-trastuzumab (Kadcyla). -- Change frequency of Denosumab every 6 weeks henceforth to improve comfort for the patient so that did also have appointments coincide with every other ado-trastuzumab (Kadcyla) treatment. -- Echocardiogram prior to return to the clinic for continued cardiotoxicity monitoring --PET/CT prior to return to the clinic to assess disease response -- Increase potassium supplementation to 3 times per day due to persistent hypokalemia --Return to clinic in 6 weeks with labs and cycle #10 of ado-trastuzumab (Kadcyla).  Voice recognition software was used and creation of this note. Despite my best effort at editing the text, some misspelling/errors may have occurred.  Orders Placed This Encounter  Procedures  . NM PET Image Restag (PS) Skull Base To Thigh    Standing Status:   Future    Standing Expiration Date:   02/06/2018    Order Specific Question:   If indicated for the ordered procedure, I authorize the administration of a radiopharmaceutical per Radiology protocol    Answer:   Yes    Order Specific Question:   Preferred imaging location?    Answer:   [facility]    Order Specific Question:   Radiology Contrast Protocol - do NOT remove file path    Answer:   [email or web address]    Order Specific Question:   Reason for Exam additional comments    Answer:   Metastatic breast cancer on systemic therapy, please eval disease  response/progression evidence  . CBC with Differential    Standing Status:   Future    Standing Expiration Date:   02/06/2018  . Comprehensive metabolic panel    Standing Status:   Future    Standing Expiration Date:   02/06/2018  . Magnesium    Standing Status:   Future    Standing Expiration Date:   02/06/2018  . ECHOCARDIOGRAM COMPLETE    Please eval LVEF while on ado-trastuzumab therapy    Standing Status:   Future    Standing Expiration Date:   05/08/2018    Scheduling Instructions:     5 weeks from today    Order Specific Question:   Where should this test be performed    Answer:   [facility]    Order Specific Question:   Perflutren DEFINITY (image enhancing agent) should be administered unless hypersensitivity or allergy exist    Answer:   Administer Perflutren    Order Specific Question:   Expected Date:    Answer:   Other - See Comments    Cancer Staging [name], stage 4, left ([place]) Staging form: Breast, AJCC 8th Edition - Pathologic stage from 12/26/2015: Stage IIB (pT3, pN2a, cM0, G2, ER: Positive, PR: Positive, HER2: Negative) - Signed by Baird Cancer, PA-C on 02/15/2016 - Pathologic stage from 02/15/2016: Stage IV (pT3, pN2a, pM1, ER: Negative, PR: Negative, HER2: Positive) - Signed by Baird Cancer, PA-C on 02/28/2016  Stage III (T4N3) R Br cancer  Staging form: Breast, AJCC 7th Edition - Clinical: Stage IIIC (T4, N3b, cM0) - Signed by Sheldon Silvan,  Manon Hilding, PA-C on 05/13/2013   All questions were answered.  . The patient knows to call the clinic with any problems, questions or concerns.  This note was electronically signed.    History of Presenting Illness EVVA DIN 62 y.o. presenting to the [facility] for diagnosis of stage IV breast cancer with skeletal metastasis, hormone-receptor negative and HER-2 positive disease.  Patient is currently receiving palliative systemic chemoimmunotherapy with ado-trastuzumab (Kadcyla) and  supportive care with denosumab.  Patient returns to the clinic for day 1 of cycle #8.  In the interim, she denies any new symptoms.  Tolerating therapy quite well.  Oncological/hematological History:   Stage III (T4N3) R Br cancer    06/21/2010 Initial Diagnosis    Stage III (T4N3) R Br cancer       01/04/2011 - 02/22/2011 Radiation Therapy    50 Gy       05/07/2014 Pathology Results    BCI testing- low risk of late recurrence (3.4% between years 5-10), high likelihood of benefit from extended endocrein therapy, and a 67% relative risk reduction when treated with extended endocrine therapy (27% versus 10.5%).       [name], stage 4, left ([place])   11/22/2015 Mammogram    Ultrasound-guided biopsy of the irregular hypoechoic area, with prominent shadowing, in the OUTER left breast at the 1 o'clock axis. 2. Ultrasound-guided biopsy of the irregular mass within the INNER left breast at the 9 o'clock axis. 3. Ultrasound-guided biopsy of 1 of the enlarged and amorphous lymph nodes in the left axilla.      11/22/2015 Imaging    Ultrasound-guided biopsy of the irregular hypoechoic area, with prominent shadowing, in the OUTER left breast at the 1 o'clock axis. 2. Ultrasound-guided biopsy of the irregular mass within the INNER left breast at the 9 o'clock axis. 3. Ultrasound-guided biopsy of 1 of the enlarged and amorphous lymph nodes in the left axilla.      12/16/2015 Surgery    L modified radical mastectomy with Dr. Arnoldo Morale      12/18/2015 - 12/21/2015 Hospital Admission    Admit date: 12/18/2015 Admission diagnosis: altered MS, acute respiratory failure Additional comments: This likely was a combination of COPD exacerbation along with mild acute on chronic diastolic CHF with  echo shows a preserved EF of around 60%, he was treated with IV steroids and Lasix, she is much better but qualifies for home oxygen which will be provided      12/19/2015 Imaging     No evidence of pulmonary embolus. 2. Small right pleural effusion, with associated atelectasis. Peripheral scarring at the anterior right upper lobe. Mild bilateral emphysema noted. 3. Scattered coronary artery calcifications seen. 4. Postoperative change at the left chest wall, reflecting recent mastectomy, with vague collections of postoperative fluid seen. 5. Vague sclerotic change and heterogeneity at vertebral body T8 raises concern for sequelae of metastatic disease      01/23/2016 PET scan    1. Widespread multifocal metastatic disease in the visualized axial and appendicular skeleton. 2. Solitary metastatic lesion in the liver. Hypermetabolic irregular left axillary lymph node adjacent to the left axillary clips. 3. Activity in the left breast related to mastectomy. 4. Low-grade activity along the pleuroparenchymal thickening anteriorly in the right chest is probably related to prior therapy rather than current malignancy. 5. Probable mild compression fracture at T8 associated with the extensive tumor at this level. There is also bony destruction of the right ninth rib posteriorly. 6.  Faintly hypermetabolic but small left external iliac lymph nodes merit surveillance  ADDENDUM: The original report was by Dr. Van Clines. The following addendum is by Dr. Van Clines:  I have one other musculoskeletal focus of hypermetabolic activity to mention. In the right latissimus dorsi muscle, there is a small hypermetabolic focus with maximum SUV of 5.4, measuring 7 mm in diameter on image 89/4, compatible with a metastatic deposit.        02/02/2016 Imaging    MRI T-spine: 1. Extensive metastatic disease throughout the cervical and thoracic spine with a subtle pathologic compression fracture of T8. Tumor extends through the posterior margin of the T8 vertebra into the spinal canal without spinal cord compression at this time. 2. No other pathologic fractures of  the thoracic spine at this time.      02/10/2016 Procedure    US biopsy of liver lesion      02/15/2016 Pathology Results    Liver, needle/core biopsy, right lobe METASTATIC CARCINOMA, CONSISTENT WITH BREAST DUCTAL CARCINOMA.      02/15/2016 - 02/28/2016 Chemotherapy    Letrozole 2.5 mg daily + Kisqali (600 mg) days 1-21 every 28 days       02/21/2016 Pathology Results    Breast Prognostic profile from liver biopsy: HER2 POSITIVE, ER/PR NEGATIVE (0%).      02/27/2016 - 03/09/2016 Radiation Therapy    Palliative XRT to T7-T11 with Dr. Lianne Cure; 30 Gy in 10 fractions.       02/28/2016 Treatment Plan Change    Due to HER2 positivity and ER/PR negativity from liver biopsy, change to systemic chemotherapy is recommended.      03/06/2016 -  Chemotherapy    Docetaxel/Herceptin/Perjeta with Neulasta support.      03/11/2016 - 03/15/2016 Hospital Admission    Admit date: 03/11/2016 Admission diagnosis: Febrile neutropenia Additional comments: COPD exacerbation      03/27/2016 Treatment Plan Change    Docetaxel dose reduced by 20%.      03/27/2016 Adverse Reaction    Demonstrating early signs of palmar-plantar erythrodysesthesia.      03/27/2016 Treatment Plan Change    Defer treatment 1 week.      04/10/2016 Imaging    MUGA- The left ventricular ejection fraction is equal to 56.2%. Normal left ventricular wall motion.      06/04/2016 PET scan    1. No residual hypermetabolism within previously hypermetabolic lesions in the left axilla, right latissimus dorsi muscle, anterior right pleural space, liver, left external iliac lymph node and bones. 2. Aortic atherosclerosis (ICD10-170.0). Coronary artery calcification.      07/25/2016 Treatment Plan Change    Started Nerlynx      09/03/2016 PET scan     Interval development of small to moderate right pleural effusion associated with a linear band of hypermetabolism in the posterior right costophrenic sulcus ( SUV max = 5.7) . Imaging  features are concerning for recurrent posterior right pleural disease.      09/06/2016 Treatment Plan Change    Stopped Nerlyn due to evidence of recurrent posterior right pleural disease.       09/11/2016 Treatment Plan Change    Started Kadcyla      11/30/2016 PET scan    IMPRESSION: 1. Reduction in volume of RIGHT pleural effusion and resolution of metabolic activity RIGHT at lung base most consistent with resolving inflammatory or infectious process. 2. No evidence disease progression. 3. Multiple sclerotic skeletal metastasis throughout the skeleton. Several foci of mild metabolic activity interspersed. No significant  change.        Medical History: Past Medical History:  Diagnosis Date  . Anxiety   . Bone metastases ([place]) 02/15/2016  . Breast CA ([place]) 07/01/2010   rt breast ca  . COPD (chronic obstructive pulmonary disease) ([place])   . Family history of breast cancer    Aunt on father's side  . Hyperlipemia   . Hypertension   . MRSA (methicillin resistant staph aureus) culture positive    in breast  . Neuropathy    bilateral toes  . Nipple discharge    with pain, infection, lump  . S/P radiation therapy 01/08/11 - 02/22/11   Right Chest Wall/ 50 Gy / 25 Fractions, Right [place]/ 50 gy/25 Fractions, Right Axillary Boost/500 cGy/25 Fractions, Right Chest Wall Boost/10 Gy/5 Fractions  . Stage III (T4N3) R Br cancer  06/21/2010   Letrozole 2.5 mg which she started on 03/09/2011.  This patient presented to my office on Jun 21, 2010 with a large ulcerated right breast mass and bleeding. She underwent additional workup at that time and a biopsy was done showing invasive breast cancer. She was staged and was found to have a T4  disease that was ER, PR, HER-2 positive. CA 2729 was normal at 31. She is undergoing chemotherapy wi  . Status post chemotherapy    6 cycles of carboplatin and docetaxel with trastuzumab every 21 days followed by surgery  . Tobacco abuse 06/22/2015  .  Use of letrozole ([place]) 03/09/2011  . Wears dentures     Surgical History: Past Surgical History:  Procedure Laterality Date  . BREAST SURGERY  2012  . DILATION AND CURETTAGE OF UTERUS    . MASTECTOMY MODIFIED RADICAL Left 12/16/2015   Procedure: MASTECTOMY MODIFIED RADICAL;  Surgeon: Aviva Signs, MD;  Location: [facility];  Service: General;  Laterality: Left;  . port a catheter insertion    . PORT-A-CATH REMOVAL Left 01/15/2013   Procedure: REMOVAL PORT-A-CATH;  Surgeon: Scherry Ran, MD;  Location: [facility];  Service: General;  Laterality: Left;  . PORTACATH PLACEMENT Right 01/16/2016   Procedure: INSERTION PORT-A-CATH;  Surgeon: Aviva Signs, MD;  Location: [facility];  Service: General;  Laterality: Right;  . TUBAL LIGATION      Family History: Family History  Problem Relation Age of Onset  . COPD Mother   . Diabetes Father   . Hypertension Father   . COPD Brother   . Cancer Maternal Aunt        ovarian - died of old age  . Cancer Paternal Aunt        had breast ca; died of of a different cancer  . Lung cancer Brother     Social History: Social History   Socioeconomic History  . Marital status: Married    Spouse name: Not on file  . Number of children: Not on file  . Years of education: Not on file  . Highest education level: Not on file  Social Needs  . Financial resource strain: Not on file  . Food insecurity - worry: Not on file  . Food insecurity - inability: Not on file  . Transportation needs - medical: Not on file  . Transportation needs - non-medical: Not on file  Occupational History  . Not on file  Tobacco Use  . Smoking status: Former Smoker    Packs/day: 0.25    Years: 30.00    Pack years: 7.50    Types: Cigarettes    Last attempt to  quit: 12/15/2015    Years since quitting: 1.1  . Smokeless tobacco: Never Used  Substance and Sexual Activity  . Alcohol use: No  . Drug use: No  . Sexual activity: Yes    Birth control/protection:  Post-menopausal  Other Topics Concern  . Not on file  Social History Narrative  . Not on file    Allergies: No Known Allergies  Medications:  Current Outpatient Medications  Medication Sig Dispense Refill  . Ado-Trastuzumab Emtansine (KADCYLA IV) Inject into the vein. Every 3 weeks    . albuterol (PROVENTIL HFA;VENTOLIN HFA) 108 (90 Base) MCG/ACT inhaler Inhale 2 puffs into the lungs every 6 (six) hours as needed for wheezing or shortness of breath.    Marland Kitchen albuterol (PROVENTIL) (2.5 MG/3ML) 0.083% nebulizer solution Inhale 3 mLs into the lungs every 6 (six) hours as needed for wheezing or shortness of breath. 75 mL 12  . CARAFATE 1 GM/10ML suspension TAKE 10 MLS BY MOUTH 4 TIMES DAILY WITH MEALS AND AT BEDTIME 420 mL 2  . cholecalciferol (VITAMIN D) 1000 units tablet Take 2,000 Units by mouth daily.    . fexofenadine (ALLEGRA) 180 MG tablet Take 180 mg by mouth daily.      . furosemide (LASIX) 40 MG tablet TAKE 1 TABLET BY MOUTH ONCE DAILY 90 tablet 0  . Ginkgo Biloba 120 MG TABS Take 120 mg by mouth daily.    Marland Kitchen lidocaine-prilocaine (EMLA) cream Apply a quarter size amount to affected area 1 hour prior to coming to chemotherapy. 30 g 2  . loperamide (IMODIUM A-D) 2 MG tablet Days 1-14 take 4 mg 3 times a day  Days 15-56 take 4 mg 2 times a day  Days 57-365 take 4 mg as needed (max 16 mg/day) 120 tablet 3  . LORazepam (ATIVAN) 0.5 MG tablet Take 1 tablet (0.5 mg total) by mouth every 8 (eight) hours. 90 tablet 2  . losartan (COZAAR) 100 MG tablet Take 0.5 tablets (50 mg total) by mouth daily. 90 tablet 1  . magic mouthwash w/lidocaine SOLN Take 5 mLs by mouth 4 (four) times daily as needed for mouth pain. 240 mL 1  . Multiple Vitamin (MULTIVITAMIN) capsule Take 1 capsule by mouth daily.      . Multiple Vitamins-Minerals (EMERGEN-C IMMUNE PO) Take 1 tablet by mouth daily.    . naproxen sodium (ANAPROX) 220 MG tablet Take 220 mg by mouth 2 (two) times daily as needed (pain). pain    .  omeprazole (PRILOSEC OTC) 20 MG tablet Take 40 mg by mouth daily.    . potassium chloride SA (KLOR-CON M20) 20 MEQ tablet Take 1 tablet (20 mEq total) by mouth 3 (three) times daily. 180 tablet 0  . prochlorperazine (COMPAZINE) 10 MG tablet Take 1 tablet (10 mg total) by mouth every 6 (six) hours as needed for nausea or vomiting. 30 tablet 2  . simethicone (MYLICON) 80 MG chewable tablet Chew 2 tablets (160 mg total) by mouth 4 (four) times daily as needed for flatulence. 30 tablet 0  . SYMBICORT 160-4.5 MCG/ACT inhaler INHALE TWO PUFFS BY MOUTH TWICE DAILY 6 g 5   No current facility-administered medications for this visit.    Facility-Administered Medications Ordered in Other Visits  Medication Dose Route Frequency Provider Last Rate Last Dose  . heparin lock flush 100 unit/mL  500 Units Intravenous Once Twana First, MD      . sodium chloride flush (NS) 0.9 % injection 10 mL  10 mL Intravenous PRN Talbert Cage,  Barbaraann Share, MD        Review of Systems: Review of Systems  All other systems reviewed and are negative.    PHYSICAL EXAMINATION Last menstrual period 03/08/2010.  ECOG PERFORMANCE STATUS: 2 - Symptomatic, <50% confined to bed  Physical Exam  Constitutional: She is oriented to person, place, and time and well-developed, well-nourished, and in no distress. No distress.  HENT:  Head: Normocephalic and atraumatic.  Mouth/Throat: Oropharynx is clear and moist. No oropharyngeal exudate.  Eyes: Conjunctivae and EOM are normal. Pupils are equal, round, and reactive to light. No scleral icterus.  Neck: No thyromegaly present.  Cardiovascular: Normal rate and regular rhythm.  No murmur heard. Pulmonary/Chest: Effort normal and breath sounds normal. No respiratory distress. She has no wheezes. She has no rales.  Abdominal: Soft. Bowel sounds are normal. She exhibits no distension. There is no tenderness. There is no rebound.  Musculoskeletal: She exhibits no edema.  Lymphadenopathy:    She  has no cervical adenopathy.  Neurological: She is alert and oriented to person, place, and time. She has normal reflexes. No cranial nerve deficit.  Skin: Skin is warm and dry. No rash noted. She is not diaphoretic. No erythema.     LABORATORY DATA: I have personally reviewed the data as listed: Infusion on 02/06/2017  Component Date Value Ref Range Status  . CA 15-3 02/06/2017 24.7  0.0 - 25.0 U/mL Final   Comment: (NOTE) Roche ECLIA  methodology Performed At: [facility] [address] [place], [place] [id number] Rush Farmer MD [id number]   . CA 27.29 02/06/2017 32.8  0.0 - 38.6 U/mL Final   Comment: (NOTE) Bayer Centaur/ACS methodology Performed At: [facility] [place], [place] [id number] Rush Farmer MD [id number]   . Sodium 02/06/2017 137  135 - 145 mmol/L Final  . Potassium 02/06/2017 3.3* 3.5 - 5.1 mmol/L Final  . Chloride 02/06/2017 90* 101 - 111 mmol/L Final  . CO2 02/06/2017 32  22 - 32 mmol/L Final  . Glucose, Bld 02/06/2017 98  65 - 99 mg/dL Final  . BUN 02/06/2017 20  6 - 20 mg/dL Final  . Creatinine, Ser 02/06/2017 0.55  0.44 - 1.00 mg/dL Final  . Calcium 02/06/2017 9.5  8.9 - 10.3 mg/dL Final  . Total Protein 02/06/2017 7.4  6.5 - 8.1 g/dL Final  . Albumin 02/06/2017 3.4* 3.5 - 5.0 g/dL Final  . AST 02/06/2017 39  15 - 41 U/L Final  . ALT 02/06/2017 35  14 - 54 U/L Final  . Alkaline Phosphatase 02/06/2017 94  38 - 126 U/L Final  . Total Bilirubin 02/06/2017 0.4  0.3 - 1.2 mg/dL Final  . GFR calc non Af Amer 02/06/2017 >60  >60 mL/min Final  . GFR calc Af Amer 02/06/2017 >60  >60 mL/min Final   Comment: (NOTE) The eGFR has been calculated using the CKD EPI equation. This calculation has not been validated in all clinical situations. eGFR's persistently <60 mL/min signify possible Chronic Kidney Disease.   . Anion gap 02/06/2017 15  5 - 15 Final  . WBC 02/06/2017 11.6* 4.0 - 10.5 K/uL Final  . RBC 02/06/2017 4.12   3.87 - 5.11 MIL/uL Final  . Hemoglobin 02/06/2017 10.3* 12.0 - 15.0 g/dL Final  . HCT 02/06/2017 35.9* 36.0 - 46.0 % Final  . MCV 02/06/2017 87.1  78.0 - 100.0 fL Final  . MCH 02/06/2017 25.0* 26.0 - 34.0 pg Final  . MCHC 02/06/2017 28.7* 30.0 - 36.0 g/dL Final  .  RDW 02/06/2017 17.2* 11.5 - 15.5 % Final  . Platelets 02/06/2017 294  150 - 400 K/uL Final  . Neutrophils Relative % 02/06/2017 73  % Final  . Lymphocytes Relative 02/06/2017 15  % Final  . Monocytes Relative 02/06/2017 10  % Final  . Eosinophils Relative 02/06/2017 2  % Final  . Basophils Relative 02/06/2017 0  % Final  . Neutro Abs 02/06/2017 8.5* 1.7 - 7.7 K/uL Final  . Lymphs Abs 02/06/2017 1.7  0.7 - 4.0 K/uL Final  . Monocytes Absolute 02/06/2017 1.2* 0.1 - 1.0 K/uL Final  . Eosinophils Absolute 02/06/2017 0.2  0.0 - 0.7 K/uL Final  . Basophils Absolute 02/06/2017 0.0  0.0 - 0.1 K/uL Final  . WBC Morphology 02/06/2017 ATYPICAL LYMPHOCYTES   Final       Ardath Sax, MD

## 2017-02-16 NOTE — Assessment & Plan Note (Signed)
62 y.o. 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  Receiving therapy with ado-trastuzumab (Kadcyla) and tolerating it quite well.  Clinical evaluation lab work today permissive to proceed with the next cycle of therapy.  Plan: -- Proceed with cycle #8 of ado-trastuzumab (Kadcyla) today. --Return to infusion in 3 weeks for the next dose of ado-trastuzumab (Kadcyla). -- Change frequency of Denosumab every 6 weeks henceforth to improve comfort for the patient so that did also have appointments coincide with every other ado-trastuzumab (Kadcyla) treatment. -- Echocardiogram prior to return to the clinic for continued cardiotoxicity monitoring --PET/CT prior to return to the clinic to assess disease response -- Increase potassium supplementation to 3 times per day due to persistent hypokalemia --Return to clinic in 6 weeks with labs and cycle #10 of ado-trastuzumab (Kadcyla).

## 2017-02-27 ENCOUNTER — Encounter (HOSPITAL_COMMUNITY): Payer: Self-pay | Admitting: Internal Medicine

## 2017-02-27 ENCOUNTER — Inpatient Hospital Stay (HOSPITAL_COMMUNITY): Payer: Managed Care, Other (non HMO)

## 2017-02-27 ENCOUNTER — Other Ambulatory Visit: Payer: Self-pay

## 2017-02-27 ENCOUNTER — Ambulatory Visit (HOSPITAL_COMMUNITY): Payer: PRIVATE HEALTH INSURANCE | Admitting: Internal Medicine

## 2017-02-27 ENCOUNTER — Encounter (HOSPITAL_COMMUNITY): Payer: Self-pay

## 2017-02-27 VITALS — BP 119/60 | HR 77 | Temp 97.6°F | Resp 20 | Wt 152.2 lb

## 2017-02-27 DIAGNOSIS — Z5112 Encounter for antineoplastic immunotherapy: Secondary | ICD-10-CM | POA: Diagnosis not present

## 2017-02-27 DIAGNOSIS — C50912 Malignant neoplasm of unspecified site of left female breast: Secondary | ICD-10-CM

## 2017-02-27 DIAGNOSIS — C7951 Secondary malignant neoplasm of bone: Secondary | ICD-10-CM

## 2017-02-27 LAB — CBC WITH DIFFERENTIAL/PLATELET
BASOS ABS: 0 10*3/uL (ref 0.0–0.1)
BASOS PCT: 0 %
EOS ABS: 0.3 10*3/uL (ref 0.0–0.7)
Eosinophils Relative: 3 %
HCT: 36.3 % (ref 36.0–46.0)
Hemoglobin: 10.7 g/dL — ABNORMAL LOW (ref 12.0–15.0)
Lymphocytes Relative: 16 %
Lymphs Abs: 1.7 10*3/uL (ref 0.7–4.0)
MCH: 25.5 pg — AB (ref 26.0–34.0)
MCHC: 29.5 g/dL — ABNORMAL LOW (ref 30.0–36.0)
MCV: 86.4 fL (ref 78.0–100.0)
Monocytes Absolute: 0.9 10*3/uL (ref 0.1–1.0)
Monocytes Relative: 9 %
Neutro Abs: 7.6 10*3/uL (ref 1.7–7.7)
Neutrophils Relative %: 72 %
PLATELETS: 274 10*3/uL (ref 150–400)
RBC: 4.2 MIL/uL (ref 3.87–5.11)
RDW: 17.6 % — ABNORMAL HIGH (ref 11.5–15.5)
WBC: 10.6 10*3/uL — AB (ref 4.0–10.5)

## 2017-02-27 LAB — COMPREHENSIVE METABOLIC PANEL
ALBUMIN: 3.4 g/dL — AB (ref 3.5–5.0)
ALK PHOS: 93 U/L (ref 38–126)
ALT: 38 U/L (ref 14–54)
AST: 38 U/L (ref 15–41)
Anion gap: 13 (ref 5–15)
BUN: 21 mg/dL — ABNORMAL HIGH (ref 6–20)
CHLORIDE: 91 mmol/L — AB (ref 101–111)
CO2: 34 mmol/L — AB (ref 22–32)
CREATININE: 0.6 mg/dL (ref 0.44–1.00)
Calcium: 9.1 mg/dL (ref 8.9–10.3)
GFR calc non Af Amer: >60 mL/min (ref >60)
GLUCOSE: 129 mg/dL — AB (ref 65–99)
Potassium: 3.5 mmol/L (ref 3.5–5.1)
SODIUM: 138 mmol/L (ref 135–145)
Total Bilirubin: 0.4 mg/dL (ref 0.3–1.2)
Total Protein: 7.4 g/dL (ref 6.5–8.1)

## 2017-02-27 LAB — MAGNESIUM: Magnesium: 1.5 mg/dL — ABNORMAL LOW (ref 1.7–2.4)

## 2017-02-27 MED ORDER — MAGNESIUM SULFATE 2 GM/50ML IV SOLN
2.0000 g | Freq: Once | INTRAVENOUS | Status: AC
  Administered 2017-02-27: 2 g via INTRAVENOUS
  Filled 2017-02-27: qty 50

## 2017-02-27 MED ORDER — SODIUM CHLORIDE 0.9 % IV SOLN
Freq: Once | INTRAVENOUS | Status: AC
  Administered 2017-02-27: 13:00:00 via INTRAVENOUS

## 2017-02-27 MED ORDER — SODIUM CHLORIDE 0.9 % IV SOLN
3.9000 mg/kg | Freq: Once | INTRAVENOUS | Status: AC
  Administered 2017-02-27: 260 mg via INTRAVENOUS
  Filled 2017-02-27: qty 5

## 2017-02-27 MED ORDER — HEPARIN SOD (PORK) LOCK FLUSH 100 UNIT/ML IV SOLN
500.0000 [IU] | Freq: Once | INTRAVENOUS | Status: AC
  Administered 2017-02-27: 500 [IU] via INTRAVENOUS

## 2017-02-27 MED ORDER — SODIUM CHLORIDE 0.9 % IV SOLN: INTRAVENOUS | Status: DC

## 2017-02-27 MED ORDER — DIPHENHYDRAMINE HCL 25 MG PO CAPS: 50.0000 mg | ORAL_CAPSULE | Freq: Once | ORAL | Status: DC

## 2017-02-27 MED ORDER — DENOSUMAB 120 MG/1.7ML ~~LOC~~ SOLN
120.0000 mg | Freq: Once | SUBCUTANEOUS | Status: AC
  Administered 2017-02-27: 120 mg via SUBCUTANEOUS
  Filled 2017-02-27: qty 1.7

## 2017-02-27 MED ORDER — ACETAMINOPHEN 325 MG PO TABS: 650.0000 mg | ORAL_TABLET | Freq: Once | ORAL | Status: DC

## 2017-02-27 NOTE — Progress Notes (Signed)
Labs reviewed today with MD. Proceed with treatment. Will give Magnesium per orders.   Treatment given per orders. Patient tolerated it well without problems. Vitals stable and discharged home from clinic ambulatory. Follow up as scheduled.

## 2017-02-27 NOTE — Patient Instructions (Signed)
[facility] Discharge Instructions for Patients Receiving Chemotherapy   Beginning January 23rd 2017 lab work for the Cancer Center will be done in the  Main lab at [facility] on 1st floor. If you have a lab appointment with the Cancer Center please come in thru the  Main Entrance and check in at the main information desk   Today you received the following chemotherapy agents   To help prevent nausea and vomiting after your treatment, we encourage you to take your nausea medication     If you develop nausea and vomiting, or diarrhea that is not controlled by your medication, call the clinic.  The clinic phone number is [phone]. Office hours are Monday-Friday 8:30am-5:00pm.  BELOW ARE SYMPTOMS THAT SHOULD BE REPORTED IMMEDIATELY:  *FEVER GREATER THAN 101.0 F  *CHILLS WITH OR WITHOUT FEVER  NAUSEA AND VOMITING THAT IS NOT CONTROLLED WITH YOUR NAUSEA MEDICATION  *UNUSUAL SHORTNESS OF BREATH  *UNUSUAL BRUISING OR BLEEDING  TENDERNESS IN MOUTH AND THROAT WITH OR WITHOUT PRESENCE OF ULCERS  *URINARY PROBLEMS  *BOWEL PROBLEMS  UNUSUAL RASH Items with * indicate a potential emergency and should be followed up as soon as possible. If you have an emergency after office hours please contact your primary care physician or go to the nearest emergency department.  Please call the clinic during office hours if you have any questions or concerns.   You may also contact the Patient Navigator at [phone] should you have any questions or need assistance in obtaining follow up care.      Resources For Cancer Patients and their Caregivers ? American Cancer Society: Can assist with transportation, wigs, general needs, runs Look Good Feel Better.        1-888-227-6333 ? Cancer Care: Provides financial assistance, online support groups, medication/co-pay assistance.  1-800-813-HOPE (4673) ? [facility] Assists [place] cancer  patients and their families through emotional , educational and financial support.  336-427-4357 ? [place] DSS Where to apply for food stamps, Medicaid and utility assistance. 336-342-1394 ? [facility]: Transportation to medical appointments. 336-347-2287 ? Social Security Administration: May apply for disability if have a Stage IV cancer. 336-342-7796 1-800-772-1213 ? [place] Aging, Disability and Transit Services: Assists with nutrition, care and transit needs. 336-349-2343         

## 2017-03-06 IMAGING — PT NM PET TUM IMG INITIAL (PI) SKULL BASE T - THIGH
8 series · 25 of 25 positions shown · non-contrast
Comparison: Multiple exams, including 06/27/2010 and 12/18/2015

ADDENDUM:
The original report was by Dr. Rutilo Meech. The following
addendum is by Dr. Rutilo Meech:

I have one other musculoskeletal focus of hypermetabolic activity to
mention. In the right latissimus dorsi muscle, there is a small
hypermetabolic focus with maximum SUV of 5.4, measuring 7 mm in
diameter on image 89/4, compatible with a metastatic deposit.
CLINICAL DATA: Subsequent treatment strategy for breast cancer,
previous on the right, recent diagnosis on the left..
EXAM:
NUCLEAR MEDICINE PET SKULL BASE TO THIGH
TECHNIQUE: 7.6 mCi F-18 FDG was injected intravenously. Full-ring PET imaging
was performed from the skull base to thigh after the radiotracer. CT
data was obtained and used for attenuation correction and anatomic
localization.
FASTING BLOOD GLUCOSE:  Value: 73 mg/dl

[Series 3: pet sk_thigh ac · axial · 5.0mm · 4.07mm/px · z∈[-810,+10]mm · 4 of 206 slices shown]
[im 1/206]
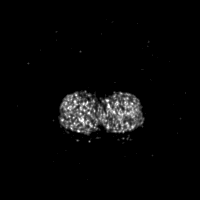
[im 69/206]
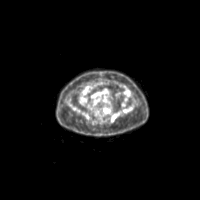
[im 137/206]
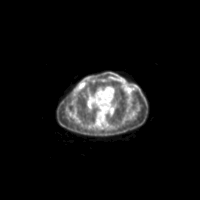
[im 206/206]
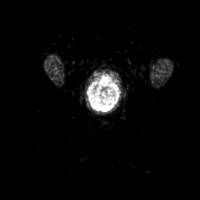

[Series 4: ct sk_thigh 5.0 b31f · axial · 5.0mm · 0.98mm/px · z∈[-810,+10]mm · 5 of 206 slices shown]
[im 1/206]
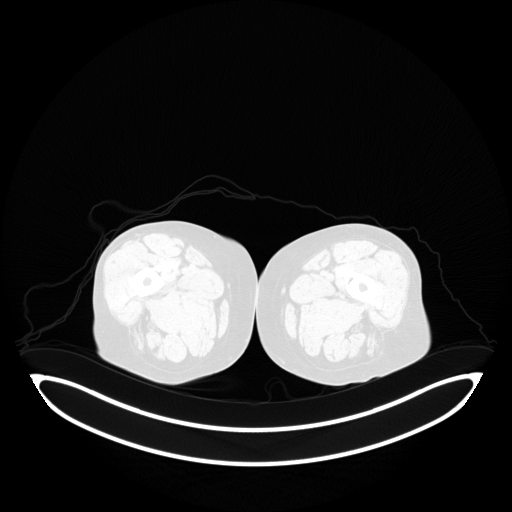
[im 52/206]
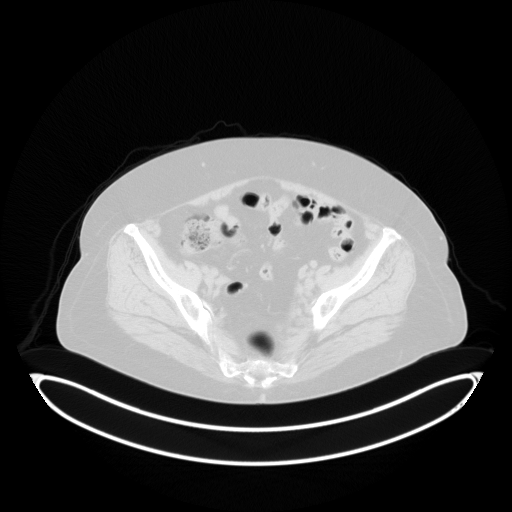
[im 103/206]
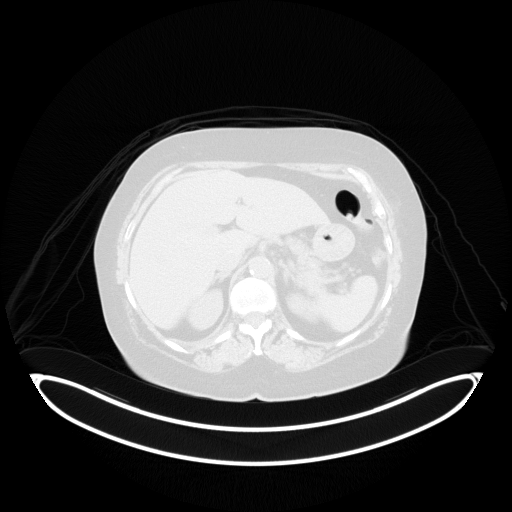
[im 154/206]
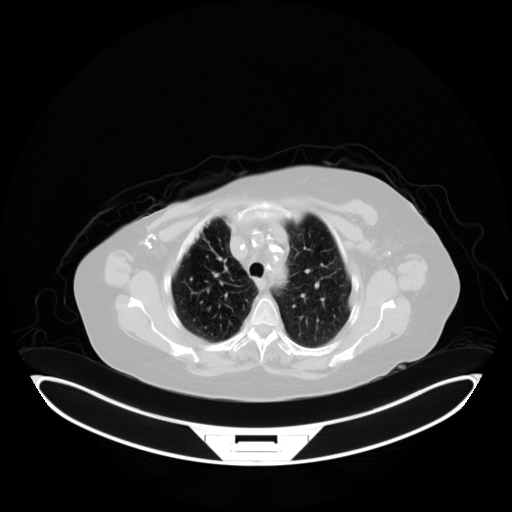
[im 206/206  brain]
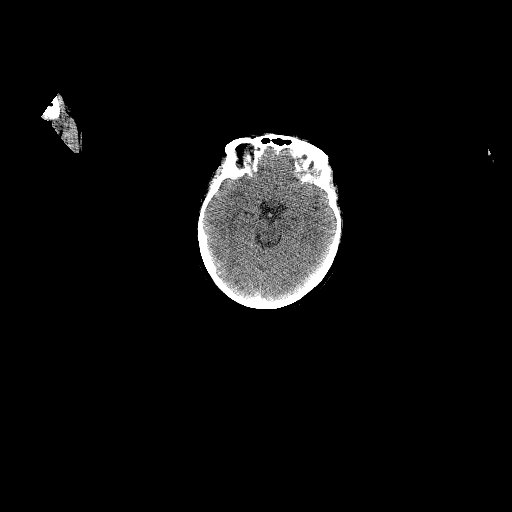

[Series 7: pet sk_thigh nac · axial · 5.0mm · 4.07mm/px · z∈[-810,+10]mm · 5 of 206 slices shown]
[im 1/206]
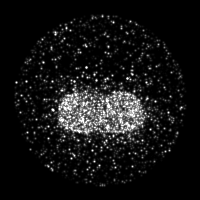
[im 52/206]
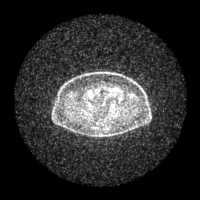
[im 103/206]
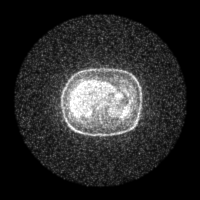
[im 154/206]
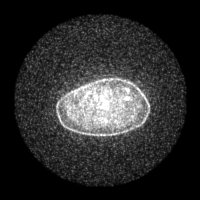
[im 206/206]
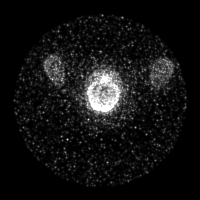

[Series 8: ct sk_thigh 5.0 b70f (id)_bone · axial · 5.0mm · 0.60mm/px · z∈[-400,-128]mm · 2 of 69 slices shown]
[im 1/69  bone]
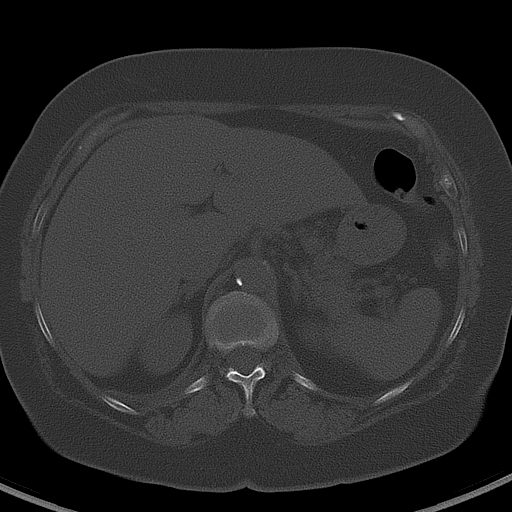
[im 69/69  bone]
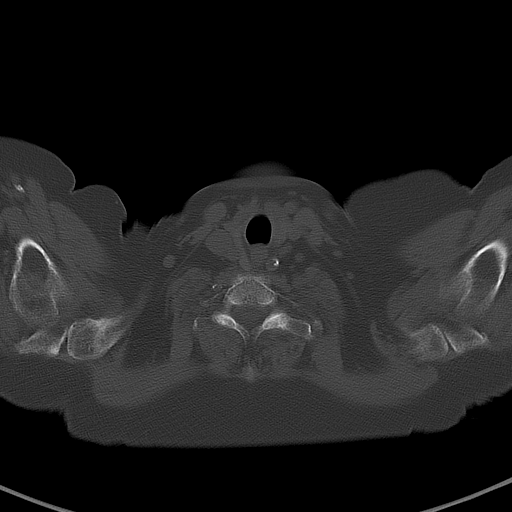

[Series 603: mip collection · coronal · 1.70mm/px · 1 of 32 slices shown]
[im 1/32]
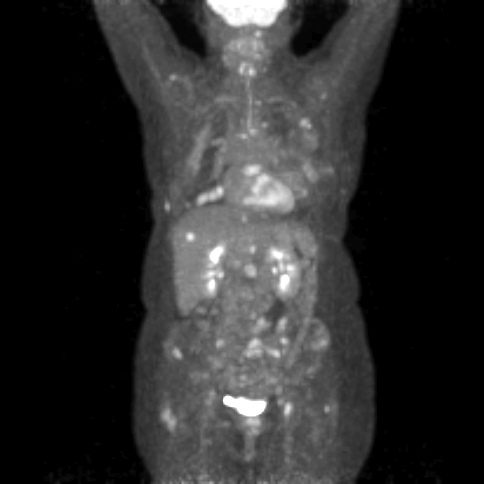

[Series 604: range-ct sk_thigh 5.0 (id)<alpha range> · 2 of 80 slices shown (1 of 2)]
[im 1/80]
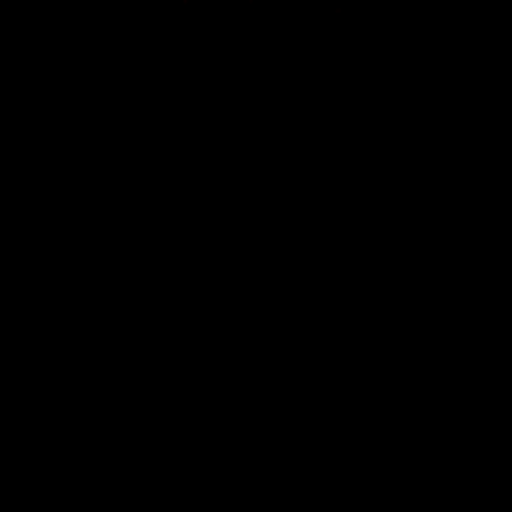
[im 80/80]
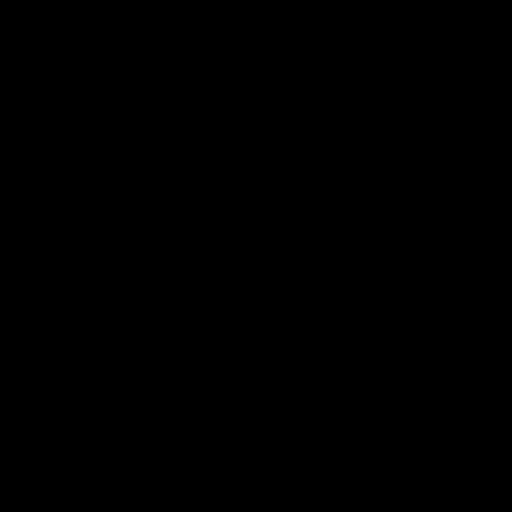

[Series 605: range-ct sk_thigh 5.0 (id)<alpha range> · 5 of 196 slices shown (2 of 2)]
[im 1/196]
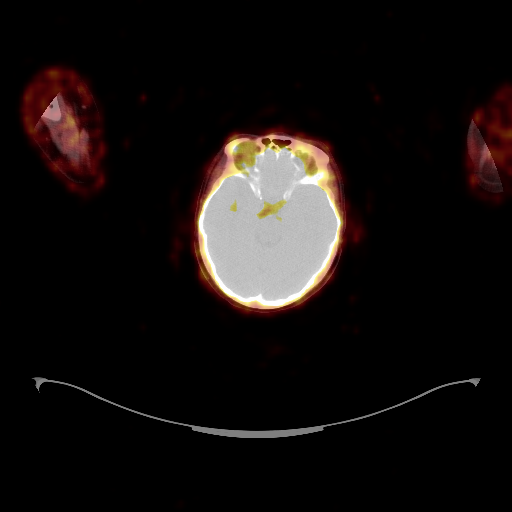
[im 49/196]
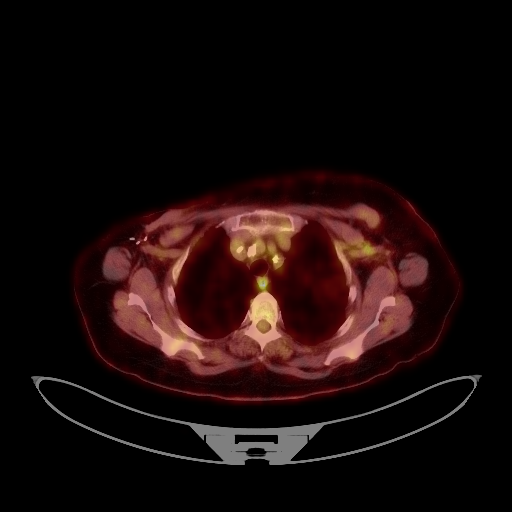
[im 98/196]
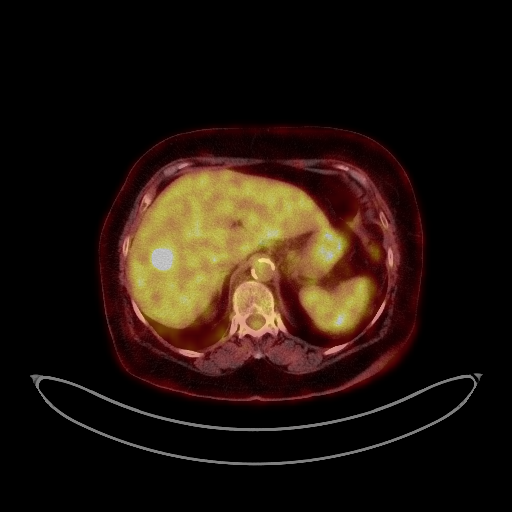
[im 147/196]
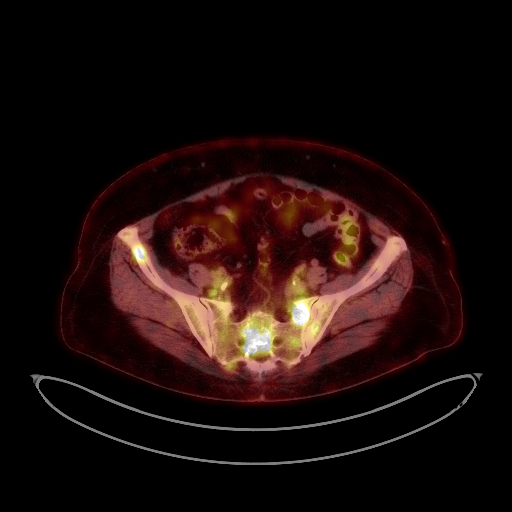
[im 196/196]
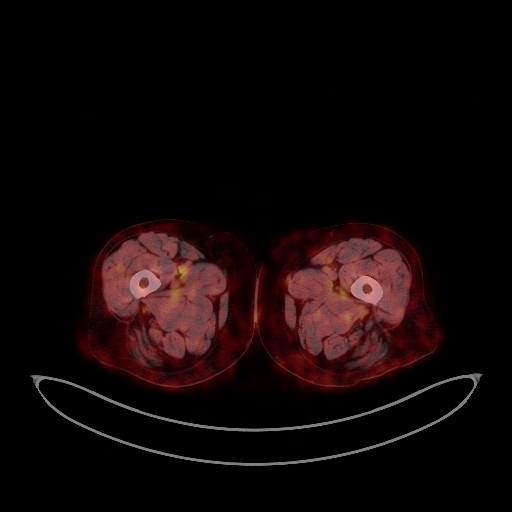

[Series 1095: results mm oncology reading · 5.0mm · 0.89mm/px · 1 of 7 slices shown]
[im 1/7]
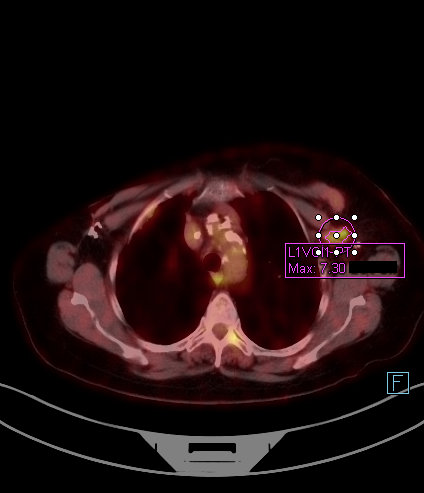

[25 of 25 positions shown; findings below may reference images not displayed]

FINDINGS: NECK

No hypermetabolic lymph nodes in the neck.

CHEST

Hypermetabolic activity along the postoperative findings in the left
axilla within irregular lymph node measuring 1.2 cm in short axis on
image 53/4 having maximum standard uptake value of 7.3. Metabolic
activity in the left breast related to recent mastectomy. There is
some pleural thickening along the right anterior chest with maximum
SUV of approximately 4.3, possibly rib reflecting prior radiation
port. 3 mm right lower lobe nodule on image 54/8, nonspecific.

ABDOMEN/PELVIS

A metastatic lesion in the right hepatic lobe measuring
approximately 2 cm in diameter based on the metabolic activity has a
maximum SUV of 13.9. Background liver activity approximately

There is some faintly hypermetabolic left external iliac nodes. For
example, an 8 mm in short axis node on image 163 of series 4 of the
CT data has a maximum SUV of 3.5.

SKELETON

Widespread osseous metastatic disease including the left side of the
C1 vertebra, sternum, numerous additional vertebral levels
especially T8, multiple destructive rib lesions especially the right
ninth rib posteriorly, and multiple lesions in the sacrum, iliac
bones, left ischium, and greater trochanters. The T8 vertebral
lesion has maximum SUV of 12.2 and the index left sacral lesion has
a maximum SUV of 10.7. Currently the hip lesions are not of a size
to be threatening for fracture.
IMPRESSION: 1. Widespread multifocal metastatic disease in the visualized axial
and appendicular skeleton.
2. Solitary metastatic lesion in the liver. Hypermetabolic irregular
left axillary lymph node adjacent to the left axillary clips.
3. Activity in the left breast related to mastectomy.
4. Low-grade activity along the pleuroparenchymal thickening
anteriorly in the right chest is probably related to prior therapy
rather than current malignancy.
5. Probable mild compression fracture at T8 associated with the
extensive tumor at this level. There is also bony destruction of the
right ninth rib posteriorly.
6. Faintly hypermetabolic but small left external iliac lymph nodes
merit surveillance.

## 2017-03-12 ENCOUNTER — Other Ambulatory Visit (HOSPITAL_COMMUNITY): Payer: Managed Care, Other (non HMO)

## 2017-03-13 ENCOUNTER — Ambulatory Visit (HOSPITAL_COMMUNITY)
Admission: RE | Admit: 2017-03-13 | Discharge: 2017-03-13 | Disposition: A | Discharge status: Home / Self Care | Payer: Managed Care, Other (non HMO) | Source: Ambulatory Visit | Attending: Hematology and Oncology | Admitting: Hematology and Oncology

## 2017-03-13 DIAGNOSIS — C50912 Malignant neoplasm of unspecified site of left female breast: Secondary | ICD-10-CM | POA: Diagnosis not present

## 2017-03-13 DIAGNOSIS — I517 Cardiomegaly: Secondary | ICD-10-CM | POA: Diagnosis not present

## 2017-03-13 DIAGNOSIS — C7951 Secondary malignant neoplasm of bone: Secondary | ICD-10-CM | POA: Diagnosis present

## 2017-03-13 LAB — ECHOCARDIOGRAM COMPLETE
CHL CUP MV DEC (S): 261
CHL CUP STROKE VOLUME: 35 mL
E/e' ratio: 6.6
EWDT: 261 ms
FS: 38 % (ref 28–44)
IV/PV OW: 1.01
LA diam end sys: 29 mm
LA diam index: 1.66 cm/m2
LASIZE: 29 mm
LAVOL: 36.7 mL
LAVOLA4C: 31.3 mL
LAVOLIN: 21 mL/m2
LV E/e' medial: 6.6
LV E/e'average: 6.6
LV TDI E'LATERAL: 12.4
LV sys vol: 25 mL
LVDIAVOL: 60 mL (ref 46–106)
LVDIAVOLIN: 34 mL/m2
LVELAT: 12.4 cm/s
LVOT SV: 62 mL
LVOT VTI: 19.7 cm
LVOT area: 3.14 cm2
LVOT diameter: 20 mm
LVOT peak grad rest: 4 mmHg
LVOT peak vel: 97.9 cm/s
LVSYSVOLIN: 14 mL/m2
Lateral S' vel: 10.9 cm/s
MV pk A vel: 72 m/s
MV pk E vel: 81.8 m/s
MVPG: 3 mmHg
PW: 10.9 mm — AB (ref 0.6–1.1)
Simpson's disk: 59
TAPSE: 17.8 mm
TDI e' medial: 8.92

## 2017-03-13 NOTE — Progress Notes (Signed)
*  PRELIMINARY RESULTS* Echocardiogram 2D Echocardiogram has been performed.  Sheila Garrison 03/13/2017, 11:57 AM

## 2017-03-18 ENCOUNTER — Ambulatory Visit (HOSPITAL_COMMUNITY)
Admission: RE | Admit: 2017-03-18 | Discharge: 2017-03-18 | Disposition: A | Discharge status: Home / Self Care | Payer: Managed Care, Other (non HMO) | Source: Ambulatory Visit | Attending: Hematology and Oncology | Admitting: Hematology and Oncology

## 2017-03-18 DIAGNOSIS — J439 Emphysema, unspecified: Secondary | ICD-10-CM | POA: Diagnosis not present

## 2017-03-18 DIAGNOSIS — J90 Pleural effusion, not elsewhere classified: Secondary | ICD-10-CM | POA: Diagnosis not present

## 2017-03-18 DIAGNOSIS — I700 Atherosclerosis of aorta: Secondary | ICD-10-CM | POA: Insufficient documentation

## 2017-03-18 DIAGNOSIS — C7951 Secondary malignant neoplasm of bone: Secondary | ICD-10-CM | POA: Diagnosis present

## 2017-03-18 DIAGNOSIS — C50912 Malignant neoplasm of unspecified site of left female breast: Secondary | ICD-10-CM | POA: Insufficient documentation

## 2017-03-18 DIAGNOSIS — I2510 Atherosclerotic heart disease of native coronary artery without angina pectoris: Secondary | ICD-10-CM | POA: Insufficient documentation

## 2017-03-18 LAB — GLUCOSE, CAPILLARY: GLUCOSE-CAPILLARY: 97 mg/dL (ref 65–99)

## 2017-03-18 MED ORDER — FLUDEOXYGLUCOSE F - 18 (FDG) INJECTION
7.6000 | Freq: Once | INTRAVENOUS | Status: AC | PRN
  Administered 2017-03-18: 7.6 via INTRAVENOUS

## 2017-03-19 LAB — GLUCOSE, CAPILLARY: GLUCOSE-CAPILLARY: 25 mg/dL — AB (ref 65–99)

## 2017-03-20 ENCOUNTER — Other Ambulatory Visit: Payer: Self-pay

## 2017-03-20 ENCOUNTER — Inpatient Hospital Stay (HOSPITAL_COMMUNITY): Payer: Managed Care, Other (non HMO) | Attending: Hematology & Oncology

## 2017-03-20 ENCOUNTER — Encounter (HOSPITAL_COMMUNITY): Payer: Self-pay | Admitting: Internal Medicine

## 2017-03-20 ENCOUNTER — Inpatient Hospital Stay (HOSPITAL_BASED_OUTPATIENT_CLINIC_OR_DEPARTMENT_OTHER): Payer: Managed Care, Other (non HMO) | Admitting: Internal Medicine

## 2017-03-20 VITALS — BP 140/77 | HR 86 | Temp 98.1°F | Resp 16 | Wt 152.5 lb

## 2017-03-20 VITALS — BP 131/76 | HR 75 | Temp 97.7°F | Resp 20

## 2017-03-20 DIAGNOSIS — Z79811 Long term (current) use of aromatase inhibitors: Secondary | ICD-10-CM | POA: Insufficient documentation

## 2017-03-20 DIAGNOSIS — K219 Gastro-esophageal reflux disease without esophagitis: Secondary | ICD-10-CM | POA: Insufficient documentation

## 2017-03-20 DIAGNOSIS — E559 Vitamin D deficiency, unspecified: Secondary | ICD-10-CM

## 2017-03-20 DIAGNOSIS — F411 Generalized anxiety disorder: Secondary | ICD-10-CM | POA: Insufficient documentation

## 2017-03-20 DIAGNOSIS — Z79899 Other long term (current) drug therapy: Secondary | ICD-10-CM | POA: Diagnosis not present

## 2017-03-20 DIAGNOSIS — M8580 Other specified disorders of bone density and structure, unspecified site: Secondary | ICD-10-CM | POA: Diagnosis not present

## 2017-03-20 DIAGNOSIS — C7951 Secondary malignant neoplasm of bone: Secondary | ICD-10-CM | POA: Insufficient documentation

## 2017-03-20 DIAGNOSIS — J439 Emphysema, unspecified: Secondary | ICD-10-CM | POA: Diagnosis not present

## 2017-03-20 DIAGNOSIS — Z170 Estrogen receptor positive status [ER+]: Secondary | ICD-10-CM

## 2017-03-20 DIAGNOSIS — I10 Essential (primary) hypertension: Secondary | ICD-10-CM

## 2017-03-20 DIAGNOSIS — C787 Secondary malignant neoplasm of liver and intrahepatic bile duct: Secondary | ICD-10-CM

## 2017-03-20 DIAGNOSIS — I700 Atherosclerosis of aorta: Secondary | ICD-10-CM

## 2017-03-20 DIAGNOSIS — Z9012 Acquired absence of left breast and nipple: Secondary | ICD-10-CM

## 2017-03-20 DIAGNOSIS — Z803 Family history of malignant neoplasm of breast: Secondary | ICD-10-CM | POA: Insufficient documentation

## 2017-03-20 DIAGNOSIS — C50912 Malignant neoplasm of unspecified site of left female breast: Secondary | ICD-10-CM

## 2017-03-20 DIAGNOSIS — I2510 Atherosclerotic heart disease of native coronary artery without angina pectoris: Secondary | ICD-10-CM | POA: Insufficient documentation

## 2017-03-20 DIAGNOSIS — Z87891 Personal history of nicotine dependence: Secondary | ICD-10-CM

## 2017-03-20 DIAGNOSIS — Z923 Personal history of irradiation: Secondary | ICD-10-CM | POA: Insufficient documentation

## 2017-03-20 DIAGNOSIS — E785 Hyperlipidemia, unspecified: Secondary | ICD-10-CM | POA: Diagnosis not present

## 2017-03-20 DIAGNOSIS — I722 Aneurysm of renal artery: Secondary | ICD-10-CM

## 2017-03-20 DIAGNOSIS — Z8041 Family history of malignant neoplasm of ovary: Secondary | ICD-10-CM | POA: Insufficient documentation

## 2017-03-20 DIAGNOSIS — Z7901 Long term (current) use of anticoagulants: Secondary | ICD-10-CM | POA: Insufficient documentation

## 2017-03-20 DIAGNOSIS — Z5112 Encounter for antineoplastic immunotherapy: Secondary | ICD-10-CM | POA: Insufficient documentation

## 2017-03-20 LAB — COMPREHENSIVE METABOLIC PANEL
ALT: 62 U/L — ABNORMAL HIGH (ref 14–54)
ANION GAP: 13 (ref 5–15)
AST: 56 U/L — ABNORMAL HIGH (ref 15–41)
Albumin: 3.5 g/dL (ref 3.5–5.0)
Alkaline Phosphatase: 118 U/L (ref 38–126)
BUN: 12 mg/dL (ref 6–20)
CHLORIDE: 92 mmol/L — AB (ref 101–111)
CO2: 32 mmol/L (ref 22–32)
CREATININE: 0.56 mg/dL (ref 0.44–1.00)
Calcium: 9.1 mg/dL (ref 8.9–10.3)
Glucose, Bld: 86 mg/dL (ref 65–99)
POTASSIUM: 3.7 mmol/L (ref 3.5–5.1)
Sodium: 137 mmol/L (ref 135–145)
Total Bilirubin: 0.3 mg/dL (ref 0.3–1.2)
Total Protein: 7.5 g/dL (ref 6.5–8.1)

## 2017-03-20 LAB — LACTATE DEHYDROGENASE: LDH: 158 U/L (ref 98–192)

## 2017-03-20 LAB — CBC WITH DIFFERENTIAL/PLATELET
Basophils Absolute: 0 10*3/uL (ref 0.0–0.1)
Basophils Relative: 0 %
EOS ABS: 0.2 10*3/uL (ref 0.0–0.7)
Eosinophils Relative: 1 %
HCT: 37.5 % (ref 36.0–46.0)
HEMOGLOBIN: 11 g/dL — AB (ref 12.0–15.0)
LYMPHS ABS: 1.7 10*3/uL (ref 0.7–4.0)
LYMPHS PCT: 16 %
MCH: 25.3 pg — AB (ref 26.0–34.0)
MCHC: 29.3 g/dL — ABNORMAL LOW (ref 30.0–36.0)
MCV: 86.4 fL (ref 78.0–100.0)
Monocytes Absolute: 1.2 10*3/uL — ABNORMAL HIGH (ref 0.1–1.0)
Monocytes Relative: 11 %
NEUTROS PCT: 72 %
Neutro Abs: 7.6 10*3/uL (ref 1.7–7.7)
Platelets: 249 10*3/uL (ref 150–400)
RBC: 4.34 MIL/uL (ref 3.87–5.11)
RDW: 18 % — ABNORMAL HIGH (ref 11.5–15.5)
WBC: 10.7 10*3/uL — AB (ref 4.0–10.5)

## 2017-03-20 LAB — MAGNESIUM: MAGNESIUM: 1.6 mg/dL — AB (ref 1.7–2.4)

## 2017-03-20 MED ORDER — ACETAMINOPHEN 325 MG PO TABS: 650.0000 mg | ORAL_TABLET | Freq: Once | ORAL | Status: DC

## 2017-03-20 MED ORDER — HEPARIN SOD (PORK) LOCK FLUSH 100 UNIT/ML IV SOLN
500.0000 [IU] | Freq: Once | INTRAVENOUS | Status: AC | PRN
  Administered 2017-03-20: 500 [IU]

## 2017-03-20 MED ORDER — DIPHENHYDRAMINE HCL 25 MG PO CAPS: 50.0000 mg | ORAL_CAPSULE | Freq: Once | ORAL | Status: DC

## 2017-03-20 MED ORDER — SODIUM CHLORIDE 0.9 % IV SOLN
Freq: Once | INTRAVENOUS | Status: AC
  Administered 2017-03-20: 12:00:00 via INTRAVENOUS

## 2017-03-20 MED ORDER — SODIUM CHLORIDE 0.9 % IV SOLN
3.9000 mg/kg | Freq: Once | INTRAVENOUS | Status: AC
  Administered 2017-03-20: 260 mg via INTRAVENOUS
  Filled 2017-03-20: qty 5

## 2017-03-20 NOTE — Progress Notes (Signed)
Labs reviewed by Dr. Walden Field. Ok to treat.

## 2017-03-20 NOTE — Progress Notes (Signed)
Tolerated infusion w/o adverse reaction.  Alert, in no distress.  VSS.  Discharged via wheelchair in c/o friend.

## 2017-03-20 NOTE — Patient Instructions (Addendum)
[place] at [facility] Discharge Instructions  RECOMMENDATIONS MADE BY THE CONSULTANT AND ANY TEST RESULTS WILL BE SENT TO YOUR REFERRING PHYSICIAN.   Your PET scan was negative. Below results reviewed with you in office visit.   We will recheck your heart function (2d echo) in 3 months  Return to see MD in 3 weeks with treatment  PET scan in 6 months  XGEVA every 6 weeks    IMPRESSION from PET scan 1. No evidence of recurrent or metabolically active metastatic disease. 2. Aortic atherosclerosis. Coronary arterycalcification. 3. Tiny rind of pleural fluid at the base of the right hemithorax, decreased from 11/30/2016. 4.  Emphysema. 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    Thank you for choosing [place] at [facility] to provide your oncology and hematology care.  To afford each patient quality time with our provider, please arrive at least 15 minutes before your scheduled appointment time.    If you have a lab appointment with the [facility] please come in thru the  Main Entrance and check in at the main information desk  You need to re-schedule your appointment should you arrive 10 or more minutes late.  We strive to give you quality time with our providers, and arriving late affects you and other patients whose appointments are after yours.  Also, if you no show three or more times for appointments you may be dismissed from the clinic at the providers discretion.     Again, thank you for choosing [facility].  Our hope is that these requests will decrease the amount of time that you wait before being seen by our physicians.       _____________________________________________________________  Should you have questions after your visit to [facility], please contact our office at [phone] between the hours of 8:30 a.m. and 4:30 p.m.  Voicemails left after 4:30 p.m. will not be  returned until the following business day.  For prescription refill requests, have your pharmacy contact our office.       Resources For Cancer Patients and their Caregivers ? American [employer] Society: Can assist with transportation, wigs, general needs, runs Look Good Feel Better.        ([phone] 550-6861 ? Cancer Care: Provides financial assistance, online support groups, medication/co-pay assistance.  1-800-813-HOPE 815-260-4725) ? [facility] Assists [place] cancer patients and their families through emotional , educational and financial support.  (602)526-0280 ? [place] DSS Where to apply for food stamps, Medicaid and utility assistance. 339-161-7238 ? [employer]: Transportation to medical appointments. 859-146-8025 ? Social Security Administration: May apply for disability if have a Stage IV cancer. 409-761-4909 (639)669-9994 ? [employer], Disability and Transit Services: Assists with nutrition, care and transit needs. [place] Support Programs: @10RELATIVEDAYS @ > Cancer Support Group  2nd Tuesday of the month 1pm-2pm, Journey Room  > Creative Journey  3rd Tuesday of the month 1130am-1pm, Journey Room  > Look Good Feel Better  1st Wednesday of the month 10am-12 noon, Journey Room (Call [place] to register [phone])

## 2017-03-27 NOTE — Progress Notes (Signed)
Diagnosis [name], stage 4, left (HCC) - Plan: CBC with Differential/Platelet, Comprehensive metabolic panel, Lactate dehydrogenase, CBC with Differential/Platelet, Comprehensive metabolic panel, Lactate dehydrogenase, ECHOCARDIOGRAM LIMITED, DISCONTINUED: 0.9 %  sodium chloride infusion, DISCONTINUED: heparin lock flush 100 unit/mL, DISCONTINUED: acetaminophen (TYLENOL) tablet 650 mg, DISCONTINUED: diphenhydrAMINE (BENADRYL) capsule 50 mg, DISCONTINUED: ado-trastuzumab emtansine (KADCYLA) 260 mg in sodium chloride 0.9 % 250 mL chemo infusion  Staging Cancer Staging [name], stage 4, left (HCC) Staging form: Breast, AJCC 8th Edition - Pathologic stage from 12/26/2015: Stage IIB (pT3, pN2a, cM0, G2, ER: Positive, PR: Positive, HER2: Negative) - Signed by Kefalas, Thomas S, PA-C on 02/15/2016 - Pathologic stage from 02/15/2016: Stage IV (pT3, pN2a, pM1, ER: Negative, PR: Negative, HER2: Positive) - Signed by Kefalas, Thomas S, PA-C on 02/28/2016   Assessment and Plan:  1.  [name], stage 4, left (HCC) 61 y.o. 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  Receiving therapy with ado-trastuzumab (Kadcyla).  PET scan done 03/18/2017 shows no evidence of metastatic disease.  Patient is here today to go over PET scan.  I have discussed with her based on her history of stage IV breast cancer she will be recommended for ongoing therapy with intermittent imaging to document ongoing response to therapy.  The patient had a recent echo on 03/13/2016 which showed EF of 60-65%.  She will continue Kadcyla and will return to clinic in 3 weeks for C11 of Kadcyla.  Labs are adequate for chemotherapy.  We will continue echo monitoring of heart function.  2.   Bone metastases.  She will continue Xgeva every 6 weeks which was previously recommended by Dr. Perlov.    3.  Hypertension.  Blood pressure is  131/76.  Continue to follow with her primary care physician as recommended.   4.  Emphysema.  This was noted on recent PET scan.  She has a history of smoking but reportedly has stopped smoking.  Options will be for pulmonary referral for management if desired.   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   She has normal renal function and this is likely an incidental finding on scan.  Interval history:61 y.o. 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  Receiving therapy with ado-trastuzumab (Kadcyla).  Current status: Patient is seen today for follow-up prior to cycle 10 of Kadcyla.  She is tolerating treatment without problems.  She is here today to go over echo and PET scan results.    Stage III (T4N3) R Br cancer  (Resolved)   06/21/2010 Initial Diagnosis    Stage III (T4N3) R Br cancer       01/04/2011 - 02/22/2011 Radiation Therapy    50 Gy       05/07/2014 Pathology Results    BCI testing- low risk of late recurrence (3.4% between years 5-10), high likelihood of benefit from extended endocrein therapy, and a 67% relative risk reduction when treated with extended endocrine therapy (27% versus 10.5%).       [name], stage 4, left (HCC)   11/22/2015 Mammogram    Ultrasound-guided biopsy of the irregular hypoechoic area, with prominent shadowing, in the OUTER left breast at the 1 o'clock axis. 2. Ultrasound-guided biopsy of the irregular mass within the INNER left breast at the 9 o'clock axis. 3. Ultrasound-guided biopsy of 1 of the enlarged and amorphous lymph nodes   in the left axilla.      11/22/2015 Imaging    Ultrasound-guided biopsy of the irregular hypoechoic area, with prominent shadowing, in the OUTER left breast at the 1 o'clock axis. 2. Ultrasound-guided biopsy of the irregular mass within the INNER left breast at the 9 o'clock axis. 3. Ultrasound-guided biopsy of 1 of the enlarged  and amorphous lymph nodes in the left axilla.      12/16/2015 Surgery    L modified radical mastectomy with Dr. Jenkins      12/18/2015 - 12/21/2015 Hospital Admission    Admit date: 12/18/2015 Admission diagnosis: altered MS, acute respiratory failure Additional comments: This likely was a combination of COPD exacerbation along with mild acute on chronic diastolic CHF with  echo shows a preserved EF of around 60%, he was treated with IV steroids and Lasix, she is much better but qualifies for home oxygen which will be provided      12/19/2015 Imaging    No evidence of pulmonary embolus. 2. Small right pleural effusion, with associated atelectasis. Peripheral scarring at the anterior right upper lobe. Mild bilateral emphysema noted. 3. Scattered coronary artery calcifications seen. 4. Postoperative change at the left chest wall, reflecting recent mastectomy, with vague collections of postoperative fluid seen. 5. Vague sclerotic change and heterogeneity at vertebral body T8 raises concern for sequelae of metastatic disease      01/23/2016 PET scan    1. Widespread multifocal metastatic disease in the visualized axial and appendicular skeleton. 2. Solitary metastatic lesion in the liver. Hypermetabolic irregular left axillary lymph node adjacent to the left axillary clips. 3. Activity in the left breast related to mastectomy. 4. Low-grade activity along the pleuroparenchymal thickening anteriorly in the right chest is probably related to prior therapy rather than current malignancy. 5. Probable mild compression fracture at T8 associated with the extensive tumor at this level. There is also bony destruction of the right ninth rib posteriorly. 6. Faintly hypermetabolic but small left external iliac lymph nodes merit surveillance  ADDENDUM: The original report was by Dr. Walter Liebkemann. The following addendum is by Dr. Walter Liebkemann:  I have one other musculoskeletal  focus of hypermetabolic activity to mention. In the right latissimus dorsi muscle, there is a small hypermetabolic focus with maximum SUV of 5.4, measuring 7 mm in diameter on image 89/4, compatible with a metastatic deposit.        02/02/2016 Imaging    MRI T-spine: 1. Extensive metastatic disease throughout the cervical and thoracic spine with a subtle pathologic compression fracture of T8. Tumor extends through the posterior margin of the T8 vertebra into the spinal canal without spinal cord compression at this time. 2. No other pathologic fractures of the thoracic spine at this time.      02/10/2016 Procedure    US biopsy of liver lesion      02/15/2016 Pathology Results    Liver, needle/core biopsy, right lobe METASTATIC CARCINOMA, CONSISTENT WITH BREAST DUCTAL CARCINOMA.      02/15/2016 - 02/28/2016 Chemotherapy    Letrozole 2.5 mg daily + Kisqali (600 mg) days 1-21 every 28 days       02/21/2016 Pathology Results    Breast Prognostic profile from liver biopsy: HER2 POSITIVE, ER/PR NEGATIVE (0%).      02/27/2016 - 03/09/2016 Radiation Therapy    Palliative XRT to T7-T11 with Dr. Zagar; 30 Gy in 10 fractions.       02/28/2016 Treatment Plan Change    Due to HER2 positivity and ER/PR   negativity from liver biopsy, change to systemic chemotherapy is recommended.      03/06/2016 -  Chemotherapy    Docetaxel/Herceptin/Perjeta with Neulasta support.      03/11/2016 - 03/15/2016 Hospital Admission    Admit date: 03/11/2016 Admission diagnosis: Febrile neutropenia Additional comments: COPD exacerbation      03/27/2016 Treatment Plan Change    Docetaxel dose reduced by 20%.      03/27/2016 Adverse Reaction    Demonstrating early signs of palmar-plantar erythrodysesthesia.      03/27/2016 Treatment Plan Change    Defer treatment 1 week.      04/10/2016 Imaging    MUGA- The left ventricular ejection fraction is equal to 56.2%. Normal left ventricular wall motion.       06/04/2016 PET scan    1. No residual hypermetabolism within previously hypermetabolic lesions in the left axilla, right latissimus dorsi muscle, anterior right pleural space, liver, left external iliac lymph node and bones. 2. Aortic atherosclerosis (ICD10-170.0). Coronary artery calcification.      07/25/2016 Treatment Plan Change    Started Nerlynx      09/03/2016 PET scan     Interval development of small to moderate right pleural effusion associated with a linear band of hypermetabolism in the posterior right costophrenic sulcus ( SUV max = 5.7) . Imaging features are concerning for recurrent posterior right pleural disease.      09/06/2016 Treatment Plan Change    Stopped Nerlyn due to evidence of recurrent posterior right pleural disease.       09/11/2016 Treatment Plan Change    Started Kadcyla      11/30/2016 PET scan    IMPRESSION: 1. Reduction in volume of RIGHT pleural effusion and resolution of metabolic activity RIGHT at lung base most consistent with resolving inflammatory or infectious process. 2. No evidence disease progression. 3. Multiple sclerotic skeletal metastasis throughout the skeleton. Several foci of mild metabolic activity interspersed. No significant change.        Problem List Patient Active Problem List   Diagnosis Date Noted  . Febrile neutropenia (HCC) [D70.9, R50.81] 03/11/2016  . COPD (chronic obstructive pulmonary disease) (HCC) [J44.9] 03/11/2016  . Goals of care, counseling/discussion [Z71.89] 02/29/2016  . Bone metastases (HCC) [C79.51] 02/15/2016  . Elevated troponin [R74.8]   . Altered mental status [R41.82] 12/18/2015  . [name], stage 4, left (HCC) [C50.912] 12/16/2015  . Tobacco abuse [Z72.0] 06/22/2015  . Vitamin D deficiency [E55.9] 04/16/2014  . GAD (generalized anxiety disorder) [F41.1] 04/16/2014  . GERD (gastroesophageal reflux disease) [K21.9] 04/16/2014  . Osteopenia [M85.80] 11/18/2013  .  Lymphedema of arm [I89.0] 10/14/2012  . Shortness of breath [R06.02] 03/11/2011  . Hypertension [I10] 09/06/2010  . Hyperlipidemia [E78.5] 09/06/2010    Past Medical History Past Medical History:  Diagnosis Date  . Anxiety   . Bone metastases (HCC) 02/15/2016  . Breast CA (HCC) 07/01/2010   rt breast ca  . COPD (chronic obstructive pulmonary disease) (HCC)   . Family history of breast cancer    Aunt on father's side  . Hyperlipemia   . Hypertension   . MRSA (methicillin resistant staph aureus) culture positive    in breast  . Neuropathy    bilateral toes  . Nipple discharge    with pain, infection, lump  . S/P radiation therapy 01/08/11 - 02/22/11   Right Chest Wall/ 50 Gy / 25 Fractions, Right [place]/ 50 gy/25 Fractions, Right Axillary Boost/500 cGy/25 Fractions, Right Chest Wall Boost/10   Gy/5 Fractions  . Stage III (T4N3) R Br cancer  06/21/2010   Letrozole 2.5 mg which she started on 03/09/2011.  This patient presented to my office on Jun 21, 2010 with a large ulcerated right breast mass and bleeding. She underwent additional workup at that time and a biopsy was done showing invasive breast cancer. She was staged and was found to have a T4  disease that was ER, PR, HER-2 positive. CA 2729 was normal at 31. She is undergoing chemotherapy wi  . Status post chemotherapy    6 cycles of carboplatin and docetaxel with trastuzumab every 21 days followed by surgery  . Tobacco abuse 06/22/2015  . Use of letrozole (Femara) 03/09/2011  . Wears dentures     Past Surgical History Past Surgical History:  Procedure Laterality Date  . BREAST SURGERY  2012  . DILATION AND CURETTAGE OF UTERUS    . MASTECTOMY MODIFIED RADICAL Left 12/16/2015   Procedure: MASTECTOMY MODIFIED RADICAL;  Surgeon: Aviva Signs, MD;  Location: [facility];  Service: General;  Laterality: Left;  . port a catheter insertion    . PORT-A-CATH REMOVAL Left 01/15/2013   Procedure: REMOVAL PORT-A-CATH;  Surgeon: Scherry Ran, MD;  Location: [facility];  Service: General;  Laterality: Left;  . PORTACATH PLACEMENT Right 01/16/2016   Procedure: INSERTION PORT-A-CATH;  Surgeon: Aviva Signs, MD;  Location: [facility];  Service: General;  Laterality: Right;  . TUBAL LIGATION      Family History Family History  Problem Relation Age of Onset  . COPD Mother   . Diabetes Father   . Hypertension Father   . COPD Brother   . Cancer Maternal Aunt        ovarian - died of old age  . Cancer Paternal Aunt        had breast ca; died of of a different cancer  . Lung cancer Brother      Social History  reports that she quit smoking about 15 months ago. Her smoking use included cigarettes. She has a 7.50 pack-year smoking history. she has never used smokeless tobacco. She reports that she does not drink alcohol or use drugs.  Medications  Current Outpatient Medications:  .  Ado-Trastuzumab Emtansine (KADCYLA IV), Inject into the vein. Every 3 weeks, Disp: , Rfl:  .  albuterol (PROVENTIL HFA;VENTOLIN HFA) 108 (90 Base) MCG/ACT inhaler, Inhale 2 puffs into the lungs every 6 (six) hours as needed for wheezing or shortness of breath., Disp: , Rfl:  .  albuterol (PROVENTIL) (2.5 MG/3ML) 0.083% nebulizer solution, Inhale 3 mLs into the lungs every 6 (six) hours as needed for wheezing or shortness of breath., Disp: 75 mL, Rfl: 12 .  CARAFATE 1 GM/10ML suspension, TAKE 10 MLS BY MOUTH 4 TIMES DAILY WITH MEALS AND AT BEDTIME, Disp: 420 mL, Rfl: 2 .  cholecalciferol (VITAMIN D) 1000 units tablet, Take 2,000 Units by mouth daily., Disp: , Rfl:  .  fexofenadine (ALLEGRA) 180 MG tablet, Take 180 mg by mouth daily.  , Disp: , Rfl:  .  furosemide (LASIX) 40 MG tablet, TAKE 1 TABLET BY MOUTH ONCE DAILY, Disp: 90 tablet, Rfl: 0 .  Ginkgo Biloba 120 MG TABS, Take 120 mg by mouth daily., Disp: , Rfl:  .  lidocaine-prilocaine (EMLA) cream, Apply a quarter size amount to affected area 1 hour prior to coming to chemotherapy., Disp: 30 g, Rfl:  2 .  loperamide (IMODIUM A-D) 2 MG tablet, Days 1-14 take 4 mg 3 times  a day  Days 15-56 take 4 mg 2 times a day  Days 57-365 take 4 mg as needed (max 16 mg/day), Disp: 120 tablet, Rfl: 3 .  LORazepam (ATIVAN) 0.5 MG tablet, Take 1 tablet (0.5 mg total) by mouth every 8 (eight) hours., Disp: 90 tablet, Rfl: 2 .  losartan (COZAAR) 100 MG tablet, Take 0.5 tablets (50 mg total) by mouth daily., Disp: 90 tablet, Rfl: 1 .  magic mouthwash w/lidocaine SOLN, Take 5 mLs by mouth 4 (four) times daily as needed for mouth pain., Disp: 240 mL, Rfl: 1 .  Multiple Vitamins-Minerals (EMERGEN-C IMMUNE PO), Take 1 tablet by mouth daily., Disp: , Rfl:  .  naproxen sodium (ANAPROX) 220 MG tablet, Take 220 mg by mouth 2 (two) times daily as needed (pain). pain, Disp: , Rfl:  .  omeprazole (PRILOSEC OTC) 20 MG tablet, Take 40 mg by mouth daily., Disp: , Rfl:  .  potassium chloride SA (KLOR-CON M20) 20 MEQ tablet, Take 1 tablet (20 mEq total) by mouth 3 (three) times daily., Disp: 180 tablet, Rfl: 0 .  prochlorperazine (COMPAZINE) 10 MG tablet, Take 1 tablet (10 mg total) by mouth every 6 (six) hours as needed for nausea or vomiting., Disp: 30 tablet, Rfl: 2 .  simethicone (MYLICON) 80 MG chewable tablet, Chew 2 tablets (160 mg total) by mouth 4 (four) times daily as needed for flatulence., Disp: 30 tablet, Rfl: 0 .  SYMBICORT 160-4.5 MCG/ACT inhaler, INHALE TWO PUFFS BY MOUTH TWICE DAILY, Disp: 6 g, Rfl: 5 No current facility-administered medications for this visit.   Facility-Administered Medications Ordered in Other Visits:  .  heparin lock flush 100 unit/mL, 500 Units, Intravenous, Once, Twana First, MD .  sodium chloride flush (NS) 0.9 % injection 10 mL, 10 mL, Intravenous, PRN, Twana First, MD  Allergies Patient has no known allergies.  Review of Systems Review of Systems - Oncology ROS as per HPI otherwise 12 point ROS is negative.   Physical Exam  Vitals Wt Readings from Last 3 Encounters:   03/20/17 152 lb 8 oz (69.2 kg)  02/27/17 152 lb 3.2 oz (69 kg)  02/06/17 153 lb 6.4 oz (69.6 kg)   Temp Readings from Last 3 Encounters:  03/20/17 97.7 F (36.5 C) (Oral)  03/20/17 98.1 F (36.7 C) (Oral)  02/27/17 97.6 F (36.4 C) (Oral)   BP Readings from Last 3 Encounters:  03/20/17 131/76  03/20/17 140/77  02/27/17 119/60   Pulse Readings from Last 3 Encounters:  03/20/17 75  03/20/17 86  02/27/17 77   Constitutional: Well-developed, well-nourished, and in no distress.   HENT:  Head: Normocephalic and atraumatic.  Mouth/Throat: No oropharyngeal exudate. Mucosa moist. Eyes: Pupils are equal, round, and reactive to light. Conjunctivae are normal. No scleral icterus.  Neck: Normal range of motion. Neck supple. No JVD present.  Cardiovascular: Normal rate, regular rhythm and normal heart sounds.  Exam reveals no gallop and no friction rub.   No murmur heard. Pulmonary/Chest: Effort normal and breath sounds normal. No respiratory distress. No wheezes.No rales.  Abdominal: Soft. Bowel sounds are normal. No distension. There is no tenderness. There is no guarding.  Musculoskeletal: No edema or tenderness.  Lymphadenopathy: No palpable cervical, axillary or supraclavicular adenopathy.  Neurological: Alert and oriented to person, place, and time. No cranial nerve deficit.  Skin: Skin is warm and dry. No rash noted. No erythema. No pallor.  Psychiatric: Affect and judgment normal.   Labs Infusion on 03/20/2017  Component Date Value Ref  Range Status  . Magnesium 03/20/2017 1.6* 1.7 - 2.4 mg/dL Final   Performed at [facility], [address] [place], [place] [address]  . LDH 03/20/2017 158  98 - 192 U/L Final   Performed at [facility], [address] [place], [place] [address]  . Sodium 03/20/2017 137  135 - 145 mmol/L Final  . Potassium 03/20/2017 3.7  3.5 - 5.1 mmol/L Final  . Chloride 03/20/2017 92* 101 - 111 mmol/L Final  . CO2 03/20/2017 32  22 - 32 mmol/L Final  .  Glucose, Bld 03/20/2017 86  65 - 99 mg/dL Final  . BUN 03/20/2017 12  6 - 20 mg/dL Final  . Creatinine, Ser 03/20/2017 0.56  0.44 - 1.00 mg/dL Final  . Calcium 03/20/2017 9.1  8.9 - 10.3 mg/dL Final  . Total Protein 03/20/2017 7.5  6.5 - 8.1 g/dL Final  . Albumin 03/20/2017 3.5  3.5 - 5.0 g/dL Final  . AST 03/20/2017 56* 15 - 41 U/L Final  . ALT 03/20/2017 62* 14 - 54 U/L Final  . Alkaline Phosphatase 03/20/2017 118  38 - 126 U/L Final  . Total Bilirubin 03/20/2017 0.3  0.3 - 1.2 mg/dL Final  . GFR calc non Af Amer 03/20/2017 >60  >60 mL/min Final  . GFR calc Af Amer 03/20/2017 >60  >60 mL/min Final   Comment: (NOTE) The eGFR has been calculated using the CKD EPI equation. This calculation has not been validated in all clinical situations. eGFR's persistently <60 mL/min signify possible Chronic Kidney Disease.   . Anion gap 03/20/2017 13  5 - 15 Final   Performed at [facility], [address] [place], [place] [address]  . WBC 03/20/2017 10.7* 4.0 - 10.5 K/uL Final  . RBC 03/20/2017 4.34  3.87 - 5.11 MIL/uL Final  . Hemoglobin 03/20/2017 11.0* 12.0 - 15.0 g/dL Final  . HCT 03/20/2017 37.5  36.0 - 46.0 % Final  . MCV 03/20/2017 86.4  78.0 - 100.0 fL Final  . MCH 03/20/2017 25.3* 26.0 - 34.0 pg Final  . MCHC 03/20/2017 29.3* 30.0 - 36.0 g/dL Final  . RDW 03/20/2017 18.0* 11.5 - 15.5 % Final  . Platelets 03/20/2017 249  150 - 400 K/uL Final  . Neutrophils Relative % 03/20/2017 72  % Final  . Neutro Abs 03/20/2017 7.6  1.7 - 7.7 K/uL Final  . Lymphocytes Relative 03/20/2017 16  % Final  . Lymphs Abs 03/20/2017 1.7  0.7 - 4.0 K/uL Final  . Monocytes Relative 03/20/2017 11  % Final  . Monocytes Absolute 03/20/2017 1.2* 0.1 - 1.0 K/uL Final  . Eosinophils Relative 03/20/2017 1  % Final  . Eosinophils Absolute 03/20/2017 0.2  0.0 - 0.7 K/uL Final  . Basophils Relative 03/20/2017 0  % Final  . Basophils Absolute 03/20/2017 0.0  0.0 - 0.1 K/uL Final   Performed at [facility],  [address] [place], [place] [address]  Hospital Outpatient Visit on 03/18/2017  Component Date Value Ref Range Status  . Glucose-Capillary 03/18/2017 25* 65 - 99 mg/dL Final   Comment: QUESTIONABLE RESULTS - CHARGE CREDITED REPEATED TO VERIFY Performed at [facility], [address] [place], [place] [address]   . Glucose-Capillary 03/18/2017 97  65 - 99 mg/dL Final    Echo done 03/13/2017 shows an ejection fraction of 60-65%.    Pet scan done 03/18/2017:  IMPRESSION: 1. No evidence of recurrent or metabolically active metastatic disease. 2. Aortic atherosclerosis (ICD10-170.0). Coronary artery calcification. 3.   Tiny rind of pleural fluid at the base of the right hemithorax, decreased from 11/30/2016. 4.  Emphysema (ICD10-J43.9). 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 Orders Placed This Encounter  Procedures  . CBC with Differential/Platelet    Standing Status:   Future    Standing Expiration Date:   03/20/2018  . Comprehensive metabolic panel    Standing Status:   Future    Standing Expiration Date:   03/20/2018  . Lactate dehydrogenase    Standing Status:   Future    Standing Expiration Date:   03/20/2018  . CBC with Differential/Platelet    Standing Status:   Future    Standing Expiration Date:   03/20/2018  . Comprehensive metabolic panel    Standing Status:   Future    Standing Expiration Date:   03/20/2018  . Lactate dehydrogenase    Standing Status:   Future    Standing Expiration Date:   03/20/2018  . ECHOCARDIOGRAM LIMITED    Standing Status:   Future    Standing Expiration Date:   06/18/2018    Order Specific Question:   Where should this test be performed    Answer:   [facility]    Order Specific Question:   Perflutren DEFINITY (image enhancing agent) should be administered unless hypersensitivity or allergy exist    Answer:   Administer Perflutren    Order Specific Question:   Expected Date:    Answer:   3 months       Vetta Higgs MD 

## 2017-04-03 ENCOUNTER — Other Ambulatory Visit: Payer: Self-pay | Admitting: Family

## 2017-04-04 NOTE — Telephone Encounter (Signed)
Last seen 9./18  [facility]

## 2017-04-10 ENCOUNTER — Encounter (HOSPITAL_COMMUNITY): Payer: Self-pay

## 2017-04-10 ENCOUNTER — Inpatient Hospital Stay (HOSPITAL_COMMUNITY): Payer: Managed Care, Other (non HMO) | Admitting: Internal Medicine

## 2017-04-10 ENCOUNTER — Inpatient Hospital Stay (HOSPITAL_COMMUNITY): Payer: Managed Care, Other (non HMO) | Attending: Hematology & Oncology

## 2017-04-10 VITALS — BP 108/63 | HR 89 | Temp 98.4°F | Resp 18 | Wt 151.4 lb

## 2017-04-10 DIAGNOSIS — C7951 Secondary malignant neoplasm of bone: Secondary | ICD-10-CM | POA: Insufficient documentation

## 2017-04-10 DIAGNOSIS — Z170 Estrogen receptor positive status [ER+]: Secondary | ICD-10-CM | POA: Diagnosis not present

## 2017-04-10 DIAGNOSIS — Z79899 Other long term (current) drug therapy: Secondary | ICD-10-CM | POA: Diagnosis not present

## 2017-04-10 DIAGNOSIS — Z5112 Encounter for antineoplastic immunotherapy: Secondary | ICD-10-CM | POA: Insufficient documentation

## 2017-04-10 DIAGNOSIS — C50912 Malignant neoplasm of unspecified site of left female breast: Secondary | ICD-10-CM | POA: Insufficient documentation

## 2017-04-10 LAB — COMPREHENSIVE METABOLIC PANEL
ALBUMIN: 3.3 g/dL — AB (ref 3.5–5.0)
ALT: 50 U/L (ref 14–54)
AST: 46 U/L — AB (ref 15–41)
Alkaline Phosphatase: 126 U/L (ref 38–126)
Anion gap: 13 (ref 5–15)
BUN: 16 mg/dL (ref 6–20)
CHLORIDE: 89 mmol/L — AB (ref 101–111)
CO2: 34 mmol/L — AB (ref 22–32)
Calcium: 9.7 mg/dL (ref 8.9–10.3)
Creatinine, Ser: 0.55 mg/dL (ref 0.44–1.00)
GFR calc Af Amer: >60 mL/min (ref >60)
GFR calc non Af Amer: >60 mL/min (ref >60)
Glucose, Bld: 127 mg/dL — ABNORMAL HIGH (ref 65–99)
POTASSIUM: 3.3 mmol/L — AB (ref 3.5–5.1)
SODIUM: 136 mmol/L (ref 135–145)
Total Bilirubin: 0.5 mg/dL (ref 0.3–1.2)
Total Protein: 7.6 g/dL (ref 6.5–8.1)

## 2017-04-10 LAB — CBC WITH DIFFERENTIAL/PLATELET
BASOS ABS: 0 10*3/uL (ref 0.0–0.1)
Basophils Relative: 0 %
Eosinophils Absolute: 0.2 10*3/uL (ref 0.0–0.7)
Eosinophils Relative: 2 %
HEMATOCRIT: 38.8 % (ref 36.0–46.0)
HEMOGLOBIN: 11.3 g/dL — AB (ref 12.0–15.0)
LYMPHS ABS: 1.7 10*3/uL (ref 0.7–4.0)
LYMPHS PCT: 15 %
MCH: 25.4 pg — AB (ref 26.0–34.0)
MCHC: 29.1 g/dL — ABNORMAL LOW (ref 30.0–36.0)
MCV: 87.2 fL (ref 78.0–100.0)
Monocytes Absolute: 0.9 10*3/uL (ref 0.1–1.0)
Monocytes Relative: 8 %
NEUTROS ABS: 8.5 10*3/uL — AB (ref 1.7–7.7)
Neutrophils Relative %: 75 %
Platelets: 234 10*3/uL (ref 150–400)
RBC: 4.45 MIL/uL (ref 3.87–5.11)
RDW: 17.8 % — ABNORMAL HIGH (ref 11.5–15.5)
WBC: 11.2 10*3/uL — AB (ref 4.0–10.5)

## 2017-04-10 LAB — LACTATE DEHYDROGENASE: LDH: 147 U/L (ref 98–192)

## 2017-04-10 MED ORDER — SODIUM CHLORIDE 0.9 % IV SOLN
Freq: Once | INTRAVENOUS | Status: AC
  Administered 2017-04-10: 12:00:00 via INTRAVENOUS

## 2017-04-10 MED ORDER — ACETAMINOPHEN 325 MG PO TABS: 650.0000 mg | ORAL_TABLET | Freq: Once | ORAL | Status: DC

## 2017-04-10 MED ORDER — HEPARIN SOD (PORK) LOCK FLUSH 100 UNIT/ML IV SOLN
500.0000 [IU] | Freq: Once | INTRAVENOUS | Status: AC | PRN
  Administered 2017-04-10: 500 [IU]

## 2017-04-10 MED ORDER — SODIUM CHLORIDE 0.9% FLUSH
10.0000 mL | INTRAVENOUS | Status: DC | PRN
  Administered 2017-04-10: 10 mL
  Filled 2017-04-10: qty 10

## 2017-04-10 MED ORDER — DIPHENHYDRAMINE HCL 25 MG PO CAPS: 50.0000 mg | ORAL_CAPSULE | Freq: Once | ORAL | Status: DC

## 2017-04-10 MED ORDER — ADO-TRASTUZUMAB EMTANSINE CHEMO INJECTION 160 MG
3.9000 mg/kg | Freq: Once | INTRAVENOUS | Status: AC
  Administered 2017-04-10: 260 mg via INTRAVENOUS
  Filled 2017-04-10: qty 5

## 2017-04-10 NOTE — Progress Notes (Signed)
Sheila Garrison tolerated Kadcyla infusion well without complaints or incident. Labs reviewed with Dr. Walden Field prior to administering this medication. VSS upon dsicharge. Pt discharged via wheelchair in satisfactory condition accompanied by a friend

## 2017-04-10 NOTE — Patient Instructions (Signed)
[facility] Discharge Instructions for Patients Receiving Chemotherapy   Beginning January 23rd 2017 lab work for the Cancer Center will be done in the  Main lab at [facility] on 1st floor. If you have a lab appointment with the Cancer Center please come in thru the  Main Entrance and check in at the main information desk   Today you received the following chemotherapy agents Kadcyla. Follow-up as scheduled. Call clinic for any questions or concerns  To help prevent nausea and vomiting after your treatment, we encourage you to take your nausea medication   If you develop nausea and vomiting, or diarrhea that is not controlled by your medication, call the clinic.  The clinic phone number is [phone]. Office hours are Monday-Friday 8:30am-5:00pm.  BELOW ARE SYMPTOMS THAT SHOULD BE REPORTED IMMEDIATELY:  *FEVER GREATER THAN 101.0 F  *CHILLS WITH OR WITHOUT FEVER  NAUSEA AND VOMITING THAT IS NOT CONTROLLED WITH YOUR NAUSEA MEDICATION  *UNUSUAL SHORTNESS OF BREATH  *UNUSUAL BRUISING OR BLEEDING  TENDERNESS IN MOUTH AND THROAT WITH OR WITHOUT PRESENCE OF ULCERS  *URINARY PROBLEMS  *BOWEL PROBLEMS  UNUSUAL RASH Items with * indicate a potential emergency and should be followed up as soon as possible. If you have an emergency after office hours please contact your primary care physician or go to the nearest emergency department.  Please call the clinic during office hours if you have any questions or concerns.   You may also contact the Patient Navigator at [phone] should you have any questions or need assistance in obtaining follow up care.      Resources For Cancer Patients and their Caregivers ? American Cancer Society: Can assist with transportation, wigs, general needs, runs Look Good Feel Better.        1-888-227-6333 ? Cancer Care: Provides financial assistance, online support groups, medication/co-pay assistance.  1-800-813-HOPE  (4673) ? [facility] Assists [place] cancer patients and their families through emotional , educational and financial support.  336-427-4357 ? [place] DSS Where to apply for food stamps, Medicaid and utility assistance. 336-342-1394 ? [facility]: Transportation to medical appointments. 336-347-2287 ? Social Security Administration: May apply for disability if have a Stage IV cancer. 336-342-7796 1-800-772-1213 ? [place] Aging, Disability and Transit Services: Assists with nutrition, care and transit needs. 336-349-2343         

## 2017-05-01 ENCOUNTER — Other Ambulatory Visit: Payer: Self-pay

## 2017-05-01 ENCOUNTER — Inpatient Hospital Stay (HOSPITAL_BASED_OUTPATIENT_CLINIC_OR_DEPARTMENT_OTHER): Payer: Managed Care, Other (non HMO) | Admitting: Internal Medicine

## 2017-05-01 ENCOUNTER — Encounter (HOSPITAL_COMMUNITY): Payer: Self-pay | Admitting: Internal Medicine

## 2017-05-01 ENCOUNTER — Inpatient Hospital Stay (HOSPITAL_COMMUNITY): Payer: Managed Care, Other (non HMO)

## 2017-05-01 VITALS — BP 112/64 | HR 78 | Temp 97.8°F | Resp 18 | Ht 64.0 in | Wt 153.2 lb

## 2017-05-01 DIAGNOSIS — C50912 Malignant neoplasm of unspecified site of left female breast: Secondary | ICD-10-CM | POA: Diagnosis not present

## 2017-05-01 DIAGNOSIS — Z5111 Encounter for antineoplastic chemotherapy: Secondary | ICD-10-CM | POA: Diagnosis not present

## 2017-05-01 DIAGNOSIS — Z170 Estrogen receptor positive status [ER+]: Secondary | ICD-10-CM

## 2017-05-01 DIAGNOSIS — Z5112 Encounter for antineoplastic immunotherapy: Secondary | ICD-10-CM | POA: Diagnosis not present

## 2017-05-01 DIAGNOSIS — Z79899 Other long term (current) drug therapy: Secondary | ICD-10-CM

## 2017-05-01 DIAGNOSIS — C7951 Secondary malignant neoplasm of bone: Secondary | ICD-10-CM

## 2017-05-01 LAB — COMPREHENSIVE METABOLIC PANEL
ALT: 64 U/L — AB (ref 14–54)
AST: 63 U/L — AB (ref 15–41)
Albumin: 3.3 g/dL — ABNORMAL LOW (ref 3.5–5.0)
Alkaline Phosphatase: 128 U/L — ABNORMAL HIGH (ref 38–126)
Anion gap: 14 (ref 5–15)
BUN: 11 mg/dL (ref 6–20)
CHLORIDE: 90 mmol/L — AB (ref 101–111)
CO2: 32 mmol/L (ref 22–32)
CREATININE: 0.59 mg/dL (ref 0.44–1.00)
Calcium: 9.6 mg/dL (ref 8.9–10.3)
GFR calc Af Amer: >60 mL/min (ref >60)
GFR calc non Af Amer: >60 mL/min (ref >60)
Glucose, Bld: 137 mg/dL — ABNORMAL HIGH (ref 65–99)
Potassium: 3.6 mmol/L (ref 3.5–5.1)
SODIUM: 136 mmol/L (ref 135–145)
Total Bilirubin: 0.6 mg/dL (ref 0.3–1.2)
Total Protein: 7.4 g/dL (ref 6.5–8.1)

## 2017-05-01 LAB — CBC WITH DIFFERENTIAL/PLATELET
Basophils Absolute: 0 10*3/uL (ref 0.0–0.1)
Basophils Relative: 0 %
EOS ABS: 0.3 10*3/uL (ref 0.0–0.7)
EOS PCT: 3 %
HCT: 37.3 % (ref 36.0–46.0)
HEMOGLOBIN: 10.8 g/dL — AB (ref 12.0–15.0)
LYMPHS ABS: 2 10*3/uL (ref 0.7–4.0)
Lymphocytes Relative: 22 %
MCH: 25.5 pg — AB (ref 26.0–34.0)
MCHC: 29 g/dL — AB (ref 30.0–36.0)
MCV: 88.2 fL (ref 78.0–100.0)
MONOS PCT: 8 %
Monocytes Absolute: 0.8 10*3/uL (ref 0.1–1.0)
Neutro Abs: 6.3 10*3/uL (ref 1.7–7.7)
Neutrophils Relative %: 67 %
PLATELETS: 191 10*3/uL (ref 150–400)
RBC: 4.23 MIL/uL (ref 3.87–5.11)
RDW: 18.5 % — ABNORMAL HIGH (ref 11.5–15.5)
WBC: 9.4 10*3/uL (ref 4.0–10.5)

## 2017-05-01 LAB — LACTATE DEHYDROGENASE: LDH: 156 U/L (ref 98–192)

## 2017-05-01 MED ORDER — SODIUM CHLORIDE 0.9 % IV SOLN
3.9000 mg/kg | Freq: Once | INTRAVENOUS | Status: AC
  Administered 2017-05-01: 260 mg via INTRAVENOUS
  Filled 2017-05-01: qty 8

## 2017-05-01 MED ORDER — HEPARIN SOD (PORK) LOCK FLUSH 100 UNIT/ML IV SOLN
500.0000 [IU] | Freq: Once | INTRAVENOUS | Status: AC | PRN
  Administered 2017-05-01: 500 [IU]
  Filled 2017-05-01: qty 5

## 2017-05-01 MED ORDER — SODIUM CHLORIDE 0.9 % IV SOLN
Freq: Once | INTRAVENOUS | Status: AC
  Administered 2017-05-01: 13:00:00 via INTRAVENOUS

## 2017-05-01 MED ORDER — DENOSUMAB 120 MG/1.7ML ~~LOC~~ SOLN
120.0000 mg | Freq: Once | SUBCUTANEOUS | Status: AC
  Administered 2017-05-01: 120 mg via SUBCUTANEOUS
  Filled 2017-05-01: qty 1.7

## 2017-05-01 MED ORDER — ACETAMINOPHEN 325 MG PO TABS: 650.0000 mg | ORAL_TABLET | Freq: Once | ORAL | Status: DC

## 2017-05-01 MED ORDER — DIPHENHYDRAMINE HCL 25 MG PO CAPS: 50.0000 mg | ORAL_CAPSULE | Freq: Once | ORAL | Status: DC

## 2017-05-01 MED ORDER — SODIUM CHLORIDE 0.9% FLUSH
10.0000 mL | INTRAVENOUS | Status: DC | PRN
  Administered 2017-05-01: 10 mL
  Filled 2017-05-01: qty 10

## 2017-05-01 NOTE — Patient Instructions (Signed)
[facility] Discharge Instructions for Patients Receiving Chemotherapy   Beginning January 23rd 2017 lab work for the Cancer Center will be done in the  Main lab at [facility] on 1st floor. If you have a lab appointment with the Cancer Center please come in thru the  Main Entrance and check in at the main information desk   Today you received the following chemotherapy agents Kadcyla as well as Xgeva injection. Follow-up as scheduled. Call clinic for any questions or concerns  To help prevent nausea and vomiting after your treatment, we encourage you to take your nausea medication   If you develop nausea and vomiting, or diarrhea that is not controlled by your medication, call the clinic.  The clinic phone number is [phone]. Office hours are Monday-Friday 8:30am-5:00pm.  BELOW ARE SYMPTOMS THAT SHOULD BE REPORTED IMMEDIATELY:  *FEVER GREATER THAN 101.0 F  *CHILLS WITH OR WITHOUT FEVER  NAUSEA AND VOMITING THAT IS NOT CONTROLLED WITH YOUR NAUSEA MEDICATION  *UNUSUAL SHORTNESS OF BREATH  *UNUSUAL BRUISING OR BLEEDING  TENDERNESS IN MOUTH AND THROAT WITH OR WITHOUT PRESENCE OF ULCERS  *URINARY PROBLEMS  *BOWEL PROBLEMS  UNUSUAL RASH Items with * indicate a potential emergency and should be followed up as soon as possible. If you have an emergency after office hours please contact your primary care physician or go to the nearest emergency department.  Please call the clinic during office hours if you have any questions or concerns.   You may also contact the Patient Navigator at [phone] should you have any questions or need assistance in obtaining follow up care.      Resources For Cancer Patients and their Caregivers ? American Cancer Society: Can assist with transportation, wigs, general needs, runs Look Good Feel Better.        1-888-227-6333 ? Cancer Care: Provides financial assistance, online support groups, medication/co-pay  assistance.  1-800-813-HOPE (4673) ? [facility] Assists [place] cancer patients and their families through emotional , educational and financial support.  336-427-4357 ? [place] DSS Where to apply for food stamps, Medicaid and utility assistance. 336-342-1394 ? [facility]: Transportation to medical appointments. 336-347-2287 ? Social Security Administration: May apply for disability if have a Stage IV cancer. 336-342-7796 1-800-772-1213 ? [place] Aging, Disability and Transit Services: Assists with nutrition, care and transit needs. 336-349-2343         

## 2017-05-01 NOTE — Progress Notes (Signed)
Diagnosis [name], stage 4, left (HCC) - Plan: CBC with Differential/Platelet, Comprehensive metabolic panel, Lactate dehydrogenase, NM PET Image Restag (PS) Skull Base To Thigh, DISCONTINUED: 0.9 %  sodium chloride infusion, DISCONTINUED: sodium chloride flush (NS) 0.9 % injection 10 mL, DISCONTINUED: heparin lock flush 100 unit/mL, DISCONTINUED: acetaminophen (TYLENOL) tablet 650 mg, DISCONTINUED: diphenhydrAMINE (BENADRYL) capsule 50 mg, DISCONTINUED: ado-trastuzumab emtansine (KADCYLA) 260 mg in sodium chloride 0.9 % 250 mL chemo infusion  Staging Cancer Staging [name], stage 4, left (HCC) Staging form: Breast, AJCC 8th Edition - Pathologic stage from 12/26/2015: Stage IIB (pT3, pN2a, cM0, G2, ER: Positive, PR: Positive, HER2: Negative) - Signed by Baird Cancer, PA-C on 02/15/2016 - Pathologic stage from 02/15/2016: Stage IV (pT3, pN2a, pM1, ER: Negative, PR: Negative, HER2: Positive) - Signed by Baird Cancer, PA-C on 02/28/2016   Assessment and Plan:  1.  [name], stage 4, left (HCC)62 y.o. 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  Receiving therapy with ado-trastuzumab (Kadcyla).  PET scan done 03/18/2017 shows no evidence of metastatic disease.  Patient is here today to go over PET scan.  I have discussed with her based on her history of stage IV breast cancer she will be recommended for ongoing therapy with intermittent imaging to document ongoing response to therapy.  The patient had a recent echo on 03/13/2016 which showed EF of 60-65%.  She will continue Kadcyla and is here today for C 12.  She will continue therapy as recommended and will RTC in 6 weeks for follow-up.  She is due for ECHO in May, 2019.  She will be set up for PET scan in 09/2017.    2.   Bone metastases.  She will continue Xgeva every 6 weeks which was previously recommended by Dr. Lebron Conners.     3.  Hypertension.  Blood pressure is 112/64.   Follow-up with PCP as recommended.    4.  Emphysema.  This was noted on recent PET scan.  She is on chronic O2 therapy on 4 L.  She has a history of smoking but reportedly has stopped smoking.  Options will be for pulmonary referral for management if desired.   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   She has normal renal function and this is likely an incidental finding on scan.  Interval history: 62 y.o. 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  Receiving therapy with ado-trastuzumab (Kadcyla) and Xgeva.    She has a history of :  [name], stage 4, left (HCC) Stage IV (pT3pN2ApM1) invasive ductal carcinoma, biopsy proven to liver, HER2 POSITIVE, ER/PR NEGATIVE.  Complicated by a LEFT invasive lobular carcinoma, Stage IIB (pT3pN2A), S/P left mastectomy by Dr. Arnoldo Morale on 12/16/2015, ER/PR+ 2%. HER2 NEGATIVE. Done prior to knowing about Stage IV disease. AND History with right invasive ductalbreast cancer, Stage IIIC, ER/PR+ and HER 2 POSITIVEin 2012 managed with right mastectomy, systemic chemotherapy, HER2 targeted therapy, XRT, and anti-endocrine therapy   She started palliative XRT to T8 pathologic fracture on 02/27/2016.  Chemo with docetaxel/herceptin/perjeta for 6 cycles: 03/06/16-06/26/16.  Current status: Patient is seen today for follow-up prior to cycle 12 of Kadcyla.  She reports she is scheduled for ECHO in May 2019.  She is tolerating treatment.      Stage III (T4N3) R Br cancer  (Resolved)  06/21/2010 Initial Diagnosis    Stage III (T4N3) R Br cancer       01/04/2011 - 02/22/2011 Radiation Therapy    50 Gy       05/07/2014 Pathology Results    BCI testing- low risk of late recurrence (3.4% between years 5-10), high likelihood of benefit from extended endocrein therapy, and a 67% relative risk reduction when treated with  extended endocrine therapy (27% versus 10.5%).       [name], stage 4, left ([place])   11/22/2015 Mammogram    Ultrasound-guided biopsy of the irregular hypoechoic area, with prominent shadowing, in the OUTER left breast at the 1 o'clock axis. 2. Ultrasound-guided biopsy of the irregular mass within the INNER left breast at the 9 o'clock axis. 3. Ultrasound-guided biopsy of 1 of the enlarged and amorphous lymph nodes in the left axilla.      11/22/2015 Imaging    Ultrasound-guided biopsy of the irregular hypoechoic area, with prominent shadowing, in the OUTER left breast at the 1 o'clock axis. 2. Ultrasound-guided biopsy of the irregular mass within the INNER left breast at the 9 o'clock axis. 3. Ultrasound-guided biopsy of 1 of the enlarged and amorphous lymph nodes in the left axilla.      12/16/2015 Surgery    L modified radical mastectomy with Dr. Arnoldo Morale      12/18/2015 - 12/21/2015 Hospital Admission    Admit date: 12/18/2015 Admission diagnosis: altered MS, acute respiratory failure Additional comments: This likely was a combination of COPD exacerbation along with mild acute on chronic diastolic CHF with  echo shows a preserved EF of around 60%, he was treated with IV steroids and Lasix, she is much better but qualifies for home oxygen which will be provided      12/19/2015 Imaging    No evidence of pulmonary embolus. 2. Small right pleural effusion, with associated atelectasis. Peripheral scarring at the anterior right upper lobe. Mild bilateral emphysema noted. 3. Scattered coronary artery calcifications seen. 4. Postoperative change at the left chest wall, reflecting recent mastectomy, with vague collections of postoperative fluid seen. 5. Vague sclerotic change and heterogeneity at vertebral body T8 raises concern for sequelae of metastatic disease      01/23/2016 PET scan    1. Widespread multifocal metastatic disease in the visualized  axial and appendicular skeleton. 2. Solitary metastatic lesion in the liver. Hypermetabolic irregular left axillary lymph node adjacent to the left axillary clips. 3. Activity in the left breast related to mastectomy. 4. Low-grade activity along the pleuroparenchymal thickening anteriorly in the right chest is probably related to prior therapy rather than current malignancy. 5. Probable mild compression fracture at T8 associated with the extensive tumor at this level. There is also bony destruction of the right ninth rib posteriorly. 6. Faintly hypermetabolic but small left external iliac lymph nodes merit surveillance  ADDENDUM: The original report was by Dr. Van Clines. The following addendum is by Dr. Van Clines:  I have one other musculoskeletal focus of hypermetabolic activity to mention. In the right latissimus dorsi muscle, there is a small hypermetabolic focus with maximum SUV of 5.4, measuring 7 mm in diameter on image 89/4, compatible with a metastatic deposit.        02/02/2016 Imaging    MRI T-spine: 1. Extensive metastatic disease throughout the cervical and thoracic spine with a subtle pathologic compression fracture of T8. Tumor extends through the posterior margin of the T8 vertebra into the spinal canal without spinal cord compression  at this time. 2. No other pathologic fractures of the thoracic spine at this time.      02/10/2016 Procedure    US biopsy of liver lesion      02/15/2016 Pathology Results    Liver, needle/core biopsy, right lobe METASTATIC CARCINOMA, CONSISTENT WITH BREAST DUCTAL CARCINOMA.      02/15/2016 - 02/28/2016 Chemotherapy    Letrozole 2.5 mg daily + Kisqali (600 mg) days 1-21 every 28 days       02/21/2016 Pathology Results    Breast Prognostic profile from liver biopsy: HER2 POSITIVE, ER/PR NEGATIVE (0%).      02/27/2016 - 03/09/2016 Radiation Therapy    Palliative XRT to T7-T11 with Dr. Lianne Cure; 30 Gy in 10  fractions.       02/28/2016 Treatment Plan Change    Due to HER2 positivity and ER/PR negativity from liver biopsy, change to systemic chemotherapy is recommended.      03/06/2016 -  Chemotherapy    Docetaxel/Herceptin/Perjeta with Neulasta support.      03/11/2016 - 03/15/2016 Hospital Admission    Admit date: 03/11/2016 Admission diagnosis: Febrile neutropenia Additional comments: COPD exacerbation      03/27/2016 Treatment Plan Change    Docetaxel dose reduced by 20%.      03/27/2016 Adverse Reaction    Demonstrating early signs of palmar-plantar erythrodysesthesia.      03/27/2016 Treatment Plan Change    Defer treatment 1 week.      04/10/2016 Imaging    MUGA- The left ventricular ejection fraction is equal to 56.2%. Normal left ventricular wall motion.      06/04/2016 PET scan    1. No residual hypermetabolism within previously hypermetabolic lesions in the left axilla, right latissimus dorsi muscle, anterior right pleural space, liver, left external iliac lymph node and bones. 2. Aortic atherosclerosis (ICD10-170.0). Coronary artery calcification.      07/25/2016 Treatment Plan Change    Started Nerlynx      09/03/2016 PET scan     Interval development of small to moderate right pleural effusion associated with a linear band of hypermetabolism in the posterior right costophrenic sulcus ( SUV max = 5.7) . Imaging features are concerning for recurrent posterior right pleural disease.      09/06/2016 Treatment Plan Change    Stopped Nerlyn due to evidence of recurrent posterior right pleural disease.       09/11/2016 Treatment Plan Change    Started Kadcyla      11/30/2016 PET scan    IMPRESSION: 1. Reduction in volume of RIGHT pleural effusion and resolution of metabolic activity RIGHT at lung base most consistent with resolving inflammatory or infectious process. 2. No evidence disease progression. 3. Multiple sclerotic skeletal metastasis throughout the  skeleton. Several foci of mild metabolic activity interspersed. No significant change.         Problem List Patient Active Problem List   Diagnosis Date Noted  . Febrile neutropenia ([place]) [D70.9, R50.81] 03/11/2016  . COPD (chronic obstructive pulmonary disease) ([place]) [J44.9] 03/11/2016  . Goals of care, counseling/discussion [Z71.89] 02/29/2016  . Bone metastases ([place]) [C79.51] 02/15/2016  . Elevated troponin [R74.8]   . Altered mental status [R41.82] 12/18/2015  . [name], stage 4, left ([place]) [C50.912] 12/16/2015  . Tobacco abuse [Z72.0] 06/22/2015  . Vitamin D deficiency [E55.9] 04/16/2014  . GAD (generalized anxiety disorder) [F41.1] 04/16/2014  . GERD (gastroesophageal reflux disease) [K21.9] 04/16/2014  . Osteopenia [M85.80] 11/18/2013  . Lymphedema of arm [I89.0] 10/14/2012  .  Shortness of breath [R06.02] 03/11/2011  . Hypertension [I10] 09/06/2010  . Hyperlipidemia [E78.5] 09/06/2010    Past Medical History Past Medical History:  Diagnosis Date  . Anxiety   . Bone metastases ([place]) 02/15/2016  . Breast CA ([place]) 07/01/2010   rt breast ca  . COPD (chronic obstructive pulmonary disease) ([place])   . Family history of breast cancer    Aunt on father's side  . Hyperlipemia   . Hypertension   . MRSA (methicillin resistant staph aureus) culture positive    in breast  . Neuropathy    bilateral toes  . Nipple discharge    with pain, infection, lump  . S/P radiation therapy 01/08/11 - 02/22/11   Right Chest Wall/ 50 Gy / 25 Fractions, Right [place]/ 50 gy/25 Fractions, Right Axillary Boost/500 cGy/25 Fractions, Right Chest Wall Boost/10 Gy/5 Fractions  . Stage III (T4N3) R Br cancer  06/21/2010   Letrozole 2.5 mg which she started on 03/09/2011.  This patient presented to my office on Jun 21, 2010 with a large ulcerated right breast mass and bleeding. She underwent additional workup at that time and a biopsy was done showing invasive breast  cancer. She was staged and was found to have a T4  disease that was ER, PR, HER-2 positive. CA 2729 was normal at 31. She is undergoing chemotherapy wi  . Status post chemotherapy    6 cycles of carboplatin and docetaxel with trastuzumab every 21 days followed by surgery  . Tobacco abuse 06/22/2015  . Use of letrozole ([place]) 03/09/2011  . Wears dentures     Past Surgical History Past Surgical History:  Procedure Laterality Date  . BREAST SURGERY  2012  . DILATION AND CURETTAGE OF UTERUS    . MASTECTOMY MODIFIED RADICAL Left 12/16/2015   Procedure: MASTECTOMY MODIFIED RADICAL;  Surgeon: Aviva Signs, MD;  Location: [facility];  Service: General;  Laterality: Left;  . port a catheter insertion    . PORT-A-CATH REMOVAL Left 01/15/2013   Procedure: REMOVAL PORT-A-CATH;  Surgeon: Scherry Ran, MD;  Location: [facility];  Service: General;  Laterality: Left;  . PORTACATH PLACEMENT Right 01/16/2016   Procedure: INSERTION PORT-A-CATH;  Surgeon: Aviva Signs, MD;  Location: [facility];  Service: General;  Laterality: Right;  . TUBAL LIGATION      Family History Family History  Problem Relation Age of Onset  . COPD Mother   . Diabetes Father   . Hypertension Father   . COPD Brother   . Cancer Maternal Aunt        ovarian - died of old age  . Cancer Paternal Aunt        had breast ca; died of of a different cancer  . Lung cancer Brother      Social History  reports that she quit smoking about 16 months ago. Her smoking use included cigarettes. She has a 7.50 pack-year smoking history. She has never used smokeless tobacco. She reports that she does not drink alcohol or use drugs.  Medications  Current Outpatient Medications:  .  Ado-Trastuzumab Emtansine (KADCYLA IV), Inject into the vein. Every 3 weeks, Disp: , Rfl:  .  albuterol (PROVENTIL HFA;VENTOLIN HFA) 108 (90 Base) MCG/ACT inhaler, Inhale 2 puffs into the lungs every 6 (six) hours as needed for wheezing or shortness of breath., Disp:  , Rfl:  .  albuterol (PROVENTIL) (2.5 MG/3ML) 0.083% nebulizer solution, Inhale 3 mLs into the lungs every 6 (six) hours as needed for wheezing or  shortness of breath., Disp: 75 mL, Rfl: 12 .  CARAFATE 1 GM/10ML suspension, TAKE 10 MLS BY MOUTH 4 TIMES DAILY WITH MEALS AND AT BEDTIME, Disp: 420 mL, Rfl: 2 .  cholecalciferol (VITAMIN D) 1000 units tablet, Take 2,000 Units by mouth daily., Disp: , Rfl:  .  fexofenadine (ALLEGRA) 180 MG tablet, Take 180 mg by mouth daily.  , Disp: , Rfl:  .  furosemide (LASIX) 40 MG tablet, TAKE 1 TABLET BY MOUTH ONCE DAILY, Disp: 90 tablet, Rfl: 0 .  Ginkgo Biloba 120 MG TABS, Take 120 mg by mouth daily., Disp: , Rfl:  .  lidocaine-prilocaine (EMLA) cream, Apply a quarter size amount to affected area 1 hour prior to coming to chemotherapy., Disp: 30 g, Rfl: 2 .  loperamide (IMODIUM A-D) 2 MG tablet, Days 1-14 take 4 mg 3 times a day  Days 15-56 take 4 mg 2 times a day  Days 57-365 take 4 mg as needed (max 16 mg/day), Disp: 120 tablet, Rfl: 3 .  LORazepam (ATIVAN) 0.5 MG tablet, Take 1 tablet (0.5 mg total) by mouth every 8 (eight) hours., Disp: 90 tablet, Rfl: 2 .  losartan (COZAAR) 100 MG tablet, Take 0.5 tablets (50 mg total) by mouth daily., Disp: 90 tablet, Rfl: 1 .  magic mouthwash w/lidocaine SOLN, Take 5 mLs by mouth 4 (four) times daily as needed for mouth pain., Disp: 240 mL, Rfl: 1 .  Multiple Vitamins-Minerals (EMERGEN-C IMMUNE PO), Take 1 tablet by mouth daily., Disp: , Rfl:  .  naproxen sodium (ANAPROX) 220 MG tablet, Take 220 mg by mouth 2 (two) times daily as needed (pain). pain, Disp: , Rfl:  .  omeprazole (PRILOSEC OTC) 20 MG tablet, Take 40 mg by mouth daily., Disp: , Rfl:  .  potassium chloride SA (KLOR-CON M20) 20 MEQ tablet, Take 1 tablet (20 mEq total) by mouth 3 (three) times daily., Disp: 180 tablet, Rfl: 0 .  prochlorperazine (COMPAZINE) 10 MG tablet, Take 1 tablet (10 mg total) by mouth every 6 (six) hours as needed for nausea or vomiting.,  Disp: 30 tablet, Rfl: 2 .  simethicone (MYLICON) 80 MG chewable tablet, Chew 2 tablets (160 mg total) by mouth 4 (four) times daily as needed for flatulence., Disp: 30 tablet, Rfl: 0 .  SYMBICORT 160-4.5 MCG/ACT inhaler, INHALE TWO PUFFS BY MOUTH TWICE DAILY, Disp: 6 g, Rfl: 5 No current facility-administered medications for this visit.   Facility-Administered Medications Ordered in Other Visits:  .  acetaminophen (TYLENOL) tablet 650 mg, 650 mg, Oral, Once, Hibba Schram, MD .  diphenhydrAMINE (BENADRYL) capsule 50 mg, 50 mg, Oral, Once, Adesuwa Osgood, MD .  heparin lock flush 100 unit/mL, 500 Units, Intravenous, Once, Twana First, MD .  sodium chloride flush (NS) 0.9 % injection 10 mL, 10 mL, Intravenous, PRN, Twana First, MD .  sodium chloride flush (NS) 0.9 % injection 10 mL, 10 mL, Intracatheter, PRN, Fox Salminen, Mathis Dad, MD  Allergies Patient has no known allergies.  Review of Systems Review of Systems - Oncology ROS as per HPI otherwise 12 point ROS is negative.   Physical Exam  Vitals Wt Readings from Last 3 Encounters:  05/01/17 153 lb 3.2 oz (69.5 kg)  04/10/17 151 lb 6.4 oz (68.7 kg)  03/20/17 152 lb 8 oz (69.2 kg)   Temp Readings from Last 3 Encounters:  05/01/17 97.8 F (36.6 C) (Oral)  04/10/17 98.4 F (36.9 C)  03/20/17 97.7 F (36.5 C) (Oral)   BP Readings from Last 3 Encounters:  05/01/17 112/64  04/10/17 108/63  03/20/17 131/76   Pulse Readings from Last 3 Encounters:  05/01/17 78  04/10/17 89  03/20/17 75   Constitutional: Well-developed, well-nourished, and in no distress.   HENT: Head: Normocephalic and atraumatic.  Mouth/Throat: No oropharyngeal exudate. Mucosa moist. Eyes: Pupils are equal, round, and reactive to light. Conjunctivae are normal. No scleral icterus.  Neck: Normal range of motion. Neck supple. No JVD present.  Cardiovascular: Normal rate, regular rhythm and normal heart sounds.  Exam reveals no gallop and no friction rub.   No murmur  heard. Pulmonary/Chest: Effort normal and breath sounds normal. No respiratory distress. No wheezes.No rales.  Abdominal: Soft. Bowel sounds are normal. No distension. There is no tenderness. There is no guarding.  Musculoskeletal: No edema or tenderness.  Lymphadenopathy: No cervical, axillary or supraclavicular adenopathy.  Neurological: Alert and oriented to person, place, and time. No cranial nerve deficit.  Skin: Skin is warm and dry. No rash noted. No erythema. No pallor.  Psychiatric: Affect and judgment normal.  Bilateral breast exam:  Chaperone present.  Bilateral Mastectomy  Labs Infusion on 05/01/2017  Component Date Value Ref Range Status  . LDH 05/01/2017 156  98 - 192 U/L Final   Performed at [facility], [address], [place], [place] [address]  . Sodium 05/01/2017 136  135 - 145 mmol/L Final  . Potassium 05/01/2017 3.6  3.5 - 5.1 mmol/L Final  . Chloride 05/01/2017 90* 101 - 111 mmol/L Final  . CO2 05/01/2017 32  22 - 32 mmol/L Final  . Glucose, Bld 05/01/2017 137* 65 - 99 mg/dL Final  . BUN 05/01/2017 11  6 - 20 mg/dL Final  . Creatinine, Ser 05/01/2017 0.59  0.44 - 1.00 mg/dL Final  . Calcium 05/01/2017 9.6  8.9 - 10.3 mg/dL Final  . Total Protein 05/01/2017 7.4  6.5 - 8.1 g/dL Final  . Albumin 05/01/2017 3.3* 3.5 - 5.0 g/dL Final  . AST 05/01/2017 63* 15 - 41 U/L Final  . ALT 05/01/2017 64* 14 - 54 U/L Final  . Alkaline Phosphatase 05/01/2017 128* 38 - 126 U/L Final  . Total Bilirubin 05/01/2017 0.6  0.3 - 1.2 mg/dL Final  . GFR calc non Af Amer 05/01/2017 >60  >60 mL/min Final  . GFR calc Af Amer 05/01/2017 >60  >60 mL/min Final   Comment: (NOTE) The eGFR has been calculated using the CKD EPI equation. This calculation has not been validated in all clinical situations. eGFR's persistently <60 mL/min signify possible Chronic Kidney Disease.   Georgiann Hahn gap 05/01/2017 14  5 - 15 Final   Performed at [facility], [address] [place], [place] [address]  .  WBC 05/01/2017 9.4  4.0 - 10.5 K/uL Final  . RBC 05/01/2017 4.23  3.87 - 5.11 MIL/uL Final  . Hemoglobin 05/01/2017 10.8* 12.0 - 15.0 g/dL Final  . HCT 05/01/2017 37.3  36.0 - 46.0 % Final  . MCV 05/01/2017 88.2  78.0 - 100.0 fL Final  . MCH 05/01/2017 25.5* 26.0 - 34.0 pg Final  . MCHC 05/01/2017 29.0* 30.0 - 36.0 g/dL Final  . RDW 05/01/2017 18.5* 11.5 - 15.5 % Final  . Platelets 05/01/2017 191  150 - 400 K/uL Final  . Neutrophils Relative % 05/01/2017 67  % Final  . Neutro Abs 05/01/2017 6.3  1.7 - 7.7 K/uL Final  . Lymphocytes Relative 05/01/2017 22  % Final  . Lymphs Abs 05/01/2017 2.0  0.7 - 4.0 K/uL Final  .  Monocytes Relative 05/01/2017 8  % Final  . Monocytes Absolute 05/01/2017 0.8  0.1 - 1.0 K/uL Final  . Eosinophils Relative 05/01/2017 3  % Final  . Eosinophils Absolute 05/01/2017 0.3  0.0 - 0.7 K/uL Final  . Basophils Relative 05/01/2017 0  % Final  . Basophils Absolute 05/01/2017 0.0  0.0 - 0.1 K/uL Final   Performed at [facility], [address], [place], [place] [address]     Pathology Orders Placed This Encounter  Procedures  . NM PET Image Restag (PS) Skull Base To Thigh    Standing Status:   Future    Standing Expiration Date:   05/01/2018    Order Specific Question:   If indicated for the ordered procedure, I authorize the administration of a radiopharmaceutical per Radiology protocol    Answer:   Yes    Order Specific Question:   Preferred imaging location?    Answer:   [facility]    Order Specific Question:   Radiology Contrast Protocol - do NOT remove file path    Answer:   [email or web address]  . CBC with Differential/Platelet    Standing Status:   Future    Standing Expiration Date:   05/02/2018  . Comprehensive metabolic panel    Standing Status:   Future    Standing Expiration Date:   05/02/2018  . Lactate dehydrogenase    Standing Status:   Future    Standing Expiration Date:   05/02/2018       Zoila Shutter MD

## 2017-05-01 NOTE — Progress Notes (Signed)
Sheila Garrison tolerated Kadcyla and Xgeva injection well without complaints or incident. Labs reviewed with and pt seen by Dr. Walden Field prior to administering these medications. Calcium 9.6 today and pt denied any tooth or jaw pain and no recent or future dental visits. VSS upon discharge. Pt discharged via wheelchair in satisfactory condition accompanied by a friend

## 2017-05-15 ENCOUNTER — Other Ambulatory Visit: Payer: Self-pay | Admitting: *Deleted

## 2017-05-15 MED ORDER — ALBUTEROL SULFATE (2.5 MG/3ML) 0.083% IN NEBU: 3.0000 mL | INHALATION_SOLUTION | Freq: Four times a day (QID) | RESPIRATORY_TRACT | 12 refills | Status: AC | PRN

## 2017-05-17 ENCOUNTER — Other Ambulatory Visit (HOSPITAL_COMMUNITY): Payer: Self-pay

## 2017-05-17 DIAGNOSIS — F411 Generalized anxiety disorder: Secondary | ICD-10-CM

## 2017-05-17 MED ORDER — LORAZEPAM 0.5 MG PO TABS: 0.5000 mg | ORAL_TABLET | Freq: Three times a day (TID) | ORAL | 2 refills | Status: AC

## 2017-05-17 NOTE — Telephone Encounter (Signed)
Patient called for refill on anxiety medication, Ativan. Reviewed with Dr. Walden Field. She okayed refill for Ativan for patient. Notified patient and explained futher refills should come from her PCP. Patient verbalized understanding.

## 2017-05-22 ENCOUNTER — Other Ambulatory Visit: Payer: Self-pay

## 2017-05-22 ENCOUNTER — Inpatient Hospital Stay (HOSPITAL_COMMUNITY): Payer: Managed Care, Other (non HMO) | Attending: Hematology & Oncology

## 2017-05-22 ENCOUNTER — Encounter (HOSPITAL_COMMUNITY): Payer: Self-pay

## 2017-05-22 VITALS — BP 116/69 | HR 79 | Temp 98.4°F | Resp 18 | Wt 154.8 lb

## 2017-05-22 DIAGNOSIS — C50912 Malignant neoplasm of unspecified site of left female breast: Secondary | ICD-10-CM

## 2017-05-22 DIAGNOSIS — Z5112 Encounter for antineoplastic immunotherapy: Secondary | ICD-10-CM | POA: Insufficient documentation

## 2017-05-22 DIAGNOSIS — Z170 Estrogen receptor positive status [ER+]: Secondary | ICD-10-CM | POA: Diagnosis not present

## 2017-05-22 LAB — CBC WITH DIFFERENTIAL/PLATELET
Basophils Absolute: 0 10*3/uL (ref 0.0–0.1)
Basophils Relative: 0 %
EOS ABS: 0.4 10*3/uL (ref 0.0–0.7)
EOS PCT: 4 %
HCT: 37 % (ref 36.0–46.0)
Hemoglobin: 10.8 g/dL — ABNORMAL LOW (ref 12.0–15.0)
LYMPHS ABS: 1.7 10*3/uL (ref 0.7–4.0)
Lymphocytes Relative: 17 %
MCH: 26.2 pg (ref 26.0–34.0)
MCHC: 29.2 g/dL — ABNORMAL LOW (ref 30.0–36.0)
MCV: 89.8 fL (ref 78.0–100.0)
MONOS PCT: 11 %
Monocytes Absolute: 1.1 10*3/uL — ABNORMAL HIGH (ref 0.1–1.0)
Neutro Abs: 6.5 10*3/uL (ref 1.7–7.7)
Neutrophils Relative %: 68 %
PLATELETS: 214 10*3/uL (ref 150–400)
RBC: 4.12 MIL/uL (ref 3.87–5.11)
RDW: 18.8 % — ABNORMAL HIGH (ref 11.5–15.5)
WBC: 9.7 10*3/uL (ref 4.0–10.5)

## 2017-05-22 LAB — COMPREHENSIVE METABOLIC PANEL
ALT: 53 U/L (ref 14–54)
AST: 58 U/L — ABNORMAL HIGH (ref 15–41)
Albumin: 3.4 g/dL — ABNORMAL LOW (ref 3.5–5.0)
Alkaline Phosphatase: 135 U/L — ABNORMAL HIGH (ref 38–126)
Anion gap: 12 (ref 5–15)
BUN: 13 mg/dL (ref 6–20)
CHLORIDE: 90 mmol/L — AB (ref 101–111)
CO2: 36 mmol/L — AB (ref 22–32)
CREATININE: 0.53 mg/dL (ref 0.44–1.00)
Calcium: 9.6 mg/dL (ref 8.9–10.3)
GFR calc Af Amer: >60 mL/min (ref >60)
GFR calc non Af Amer: >60 mL/min (ref >60)
Glucose, Bld: 131 mg/dL — ABNORMAL HIGH (ref 65–99)
Potassium: 3.1 mmol/L — ABNORMAL LOW (ref 3.5–5.1)
SODIUM: 138 mmol/L (ref 135–145)
Total Bilirubin: 0.4 mg/dL (ref 0.3–1.2)
Total Protein: 7.4 g/dL (ref 6.5–8.1)

## 2017-05-22 MED ORDER — SODIUM CHLORIDE 0.9% FLUSH
10.0000 mL | INTRAVENOUS | Status: DC | PRN
  Administered 2017-05-22: 10 mL
  Filled 2017-05-22: qty 10

## 2017-05-22 MED ORDER — HEPARIN SOD (PORK) LOCK FLUSH 100 UNIT/ML IV SOLN
500.0000 [IU] | Freq: Once | INTRAVENOUS | Status: AC | PRN
  Administered 2017-05-22: 500 [IU]

## 2017-05-22 MED ORDER — SODIUM CHLORIDE 0.9 % IV SOLN
Freq: Once | INTRAVENOUS | Status: AC
  Administered 2017-05-22: 12:00:00 via INTRAVENOUS

## 2017-05-22 MED ORDER — DIPHENHYDRAMINE HCL 25 MG PO CAPS: 50.0000 mg | ORAL_CAPSULE | Freq: Once | ORAL | Status: DC

## 2017-05-22 MED ORDER — SODIUM CHLORIDE 0.9 % IV SOLN
3.9000 mg/kg | Freq: Once | INTRAVENOUS | Status: AC
  Administered 2017-05-22: 260 mg via INTRAVENOUS
  Filled 2017-05-22: qty 8

## 2017-05-22 MED ORDER — ACETAMINOPHEN 325 MG PO TABS: 650.0000 mg | ORAL_TABLET | Freq: Once | ORAL | Status: DC

## 2017-05-22 NOTE — Progress Notes (Signed)
Labs met parameters for chemotherapy today.  Treatment given per orders. Patient tolerated it well without problems. Vitals stable and discharged home from clinic via wheelchair. Follow up as scheduled.

## 2017-05-22 NOTE — Patient Instructions (Signed)
[facility] Discharge Instructions for Patients Receiving Chemotherapy   Beginning January 23rd 2017 lab work for the Cancer Center will be done in the  Main lab at [facility] on 1st floor. If you have a lab appointment with the Cancer Center please come in thru the  Main Entrance and check in at the main information desk   Today you received the following chemotherapy agents   To help prevent nausea and vomiting after your treatment, we encourage you to take your nausea medication     If you develop nausea and vomiting, or diarrhea that is not controlled by your medication, call the clinic.  The clinic phone number is [phone]. Office hours are Monday-Friday 8:30am-5:00pm.  BELOW ARE SYMPTOMS THAT SHOULD BE REPORTED IMMEDIATELY:  *FEVER GREATER THAN 101.0 F  *CHILLS WITH OR WITHOUT FEVER  NAUSEA AND VOMITING THAT IS NOT CONTROLLED WITH YOUR NAUSEA MEDICATION  *UNUSUAL SHORTNESS OF BREATH  *UNUSUAL BRUISING OR BLEEDING  TENDERNESS IN MOUTH AND THROAT WITH OR WITHOUT PRESENCE OF ULCERS  *URINARY PROBLEMS  *BOWEL PROBLEMS  UNUSUAL RASH Items with * indicate a potential emergency and should be followed up as soon as possible. If you have an emergency after office hours please contact your primary care physician or go to the nearest emergency department.  Please call the clinic during office hours if you have any questions or concerns.   You may also contact the Patient Navigator at [phone] should you have any questions or need assistance in obtaining follow up care.      Resources For Cancer Patients and their Caregivers ? [employer]: Can assist with transportation, wigs, general needs, runs Look Good Feel Better.        1-888-227-6333 ? Cancer Care: Provides financial assistance, online support groups, medication/co-pay assistance.  1-800-813-HOPE (4673) ? [facility] Assists [place] cancer  patients and their families through emotional , educational and financial support.  336-427-4357 ? [place] DSS Where to apply for food stamps, Medicaid and utility assistance. 336-342-1394 ? [facility]: Transportation to medical appointments. 336-347-2287 ? Social Security Administration: May apply for disability if have a Stage IV cancer. 336-342-7796 1-800-772-1213 ? [place] [facility]: Assists with nutrition, care and transit needs. 336-349-2343         

## 2017-05-24 ENCOUNTER — Other Ambulatory Visit (HOSPITAL_COMMUNITY): Payer: Self-pay | Admitting: Hematology and Oncology

## 2017-05-24 DIAGNOSIS — E876 Hypokalemia: Secondary | ICD-10-CM

## 2017-05-29 ENCOUNTER — Other Ambulatory Visit (HOSPITAL_COMMUNITY): Payer: Self-pay | Admitting: Hematology and Oncology

## 2017-05-29 DIAGNOSIS — E876 Hypokalemia: Secondary | ICD-10-CM

## 2017-05-29 NOTE — Telephone Encounter (Signed)
Patient called for refill on potassium. Reviewed with provider, chart checked and refilled.

## 2017-06-12 ENCOUNTER — Encounter (HOSPITAL_COMMUNITY): Payer: Self-pay | Admitting: Internal Medicine

## 2017-06-12 ENCOUNTER — Inpatient Hospital Stay (HOSPITAL_COMMUNITY): Payer: Managed Care, Other (non HMO) | Attending: Hematology & Oncology | Admitting: Internal Medicine

## 2017-06-12 ENCOUNTER — Other Ambulatory Visit: Payer: Self-pay

## 2017-06-12 ENCOUNTER — Inpatient Hospital Stay (HOSPITAL_COMMUNITY): Payer: Managed Care, Other (non HMO)

## 2017-06-12 VITALS — BP 127/81 | HR 76 | Temp 98.1°F | Resp 18

## 2017-06-12 VITALS — BP 126/73 | HR 85 | Temp 98.1°F | Resp 18 | Wt 152.0 lb

## 2017-06-12 DIAGNOSIS — Z9221 Personal history of antineoplastic chemotherapy: Secondary | ICD-10-CM | POA: Diagnosis not present

## 2017-06-12 DIAGNOSIS — E876 Hypokalemia: Secondary | ICD-10-CM

## 2017-06-12 DIAGNOSIS — C50912 Malignant neoplasm of unspecified site of left female breast: Secondary | ICD-10-CM | POA: Diagnosis not present

## 2017-06-12 DIAGNOSIS — Z923 Personal history of irradiation: Secondary | ICD-10-CM | POA: Insufficient documentation

## 2017-06-12 DIAGNOSIS — C7951 Secondary malignant neoplasm of bone: Secondary | ICD-10-CM | POA: Diagnosis not present

## 2017-06-12 DIAGNOSIS — Z5112 Encounter for antineoplastic immunotherapy: Secondary | ICD-10-CM | POA: Insufficient documentation

## 2017-06-12 LAB — CBC WITH DIFFERENTIAL/PLATELET
BASOS ABS: 0 10*3/uL (ref 0.0–0.1)
BASOS PCT: 0 %
Eosinophils Absolute: 0.1 10*3/uL (ref 0.0–0.7)
Eosinophils Relative: 2 %
HCT: 37.8 % (ref 36.0–46.0)
HEMOGLOBIN: 11.2 g/dL — AB (ref 12.0–15.0)
Lymphocytes Relative: 20 %
Lymphs Abs: 2 10*3/uL (ref 0.7–4.0)
MCH: 26.4 pg (ref 26.0–34.0)
MCHC: 29.6 g/dL — AB (ref 30.0–36.0)
MCV: 88.9 fL (ref 78.0–100.0)
MONOS PCT: 11 %
Monocytes Absolute: 1 10*3/uL (ref 0.1–1.0)
NEUTROS PCT: 67 %
Neutro Abs: 6.4 10*3/uL (ref 1.7–7.7)
Platelets: 230 10*3/uL (ref 150–400)
RBC: 4.25 MIL/uL (ref 3.87–5.11)
RDW: 17.8 % — AB (ref 11.5–15.5)
WBC: 9.5 10*3/uL (ref 4.0–10.5)

## 2017-06-12 LAB — LACTATE DEHYDROGENASE: LDH: 175 U/L (ref 98–192)

## 2017-06-12 LAB — COMPREHENSIVE METABOLIC PANEL
ALK PHOS: 120 U/L (ref 38–126)
ALT: 38 U/L (ref 14–54)
AST: 50 U/L — ABNORMAL HIGH (ref 15–41)
Albumin: 3.4 g/dL — ABNORMAL LOW (ref 3.5–5.0)
Anion gap: 10 (ref 5–15)
BUN: 11 mg/dL (ref 6–20)
CO2: 36 mmol/L — ABNORMAL HIGH (ref 22–32)
CREATININE: 0.55 mg/dL (ref 0.44–1.00)
Calcium: 9.3 mg/dL (ref 8.9–10.3)
Chloride: 90 mmol/L — ABNORMAL LOW (ref 101–111)
Glucose, Bld: 128 mg/dL — ABNORMAL HIGH (ref 65–99)
Potassium: 3.3 mmol/L — ABNORMAL LOW (ref 3.5–5.1)
Sodium: 136 mmol/L (ref 135–145)
TOTAL PROTEIN: 7.5 g/dL (ref 6.5–8.1)
Total Bilirubin: 0.5 mg/dL (ref 0.3–1.2)

## 2017-06-12 MED ORDER — SODIUM CHLORIDE 0.9 % IV SOLN
Freq: Once | INTRAVENOUS | Status: AC
  Administered 2017-06-12: 12:00:00 via INTRAVENOUS

## 2017-06-12 MED ORDER — HEPARIN SOD (PORK) LOCK FLUSH 100 UNIT/ML IV SOLN
500.0000 [IU] | Freq: Once | INTRAVENOUS | Status: AC | PRN
  Administered 2017-06-12: 500 [IU]
  Filled 2017-06-12: qty 5

## 2017-06-12 MED ORDER — DIPHENHYDRAMINE HCL 25 MG PO CAPS: 50.0000 mg | ORAL_CAPSULE | Freq: Once | ORAL | Status: DC

## 2017-06-12 MED ORDER — ACETAMINOPHEN 325 MG PO TABS: 650.0000 mg | ORAL_TABLET | Freq: Once | ORAL | Status: DC

## 2017-06-12 MED ORDER — DENOSUMAB 120 MG/1.7ML ~~LOC~~ SOLN
120.0000 mg | Freq: Once | SUBCUTANEOUS | Status: AC
  Administered 2017-06-12: 120 mg via SUBCUTANEOUS
  Filled 2017-06-12: qty 1.7

## 2017-06-12 MED ORDER — SODIUM CHLORIDE 0.9 % IV SOLN
3.9000 mg/kg | Freq: Once | INTRAVENOUS | Status: AC
  Administered 2017-06-12: 260 mg via INTRAVENOUS
  Filled 2017-06-12: qty 8

## 2017-06-12 MED ORDER — SODIUM CHLORIDE 0.9% FLUSH
10.0000 mL | INTRAVENOUS | Status: DC | PRN
  Administered 2017-06-12: 10 mL
  Filled 2017-06-12: qty 10

## 2017-06-12 MED ORDER — POTASSIUM CHLORIDE 10 MEQ/100ML IV SOLN
10.0000 meq | INTRAVENOUS | Status: AC
  Administered 2017-06-12 (×2): 10 meq via INTRAVENOUS
  Filled 2017-06-12 (×2): qty 100

## 2017-06-12 NOTE — Progress Notes (Signed)
1140 Labs reviewed with and pt seen by Dr. Walden Field and pt approved for Kadcyla infusion today with Potassium 20 meq IV infusion added per MD                Sheila Garrison tolerated Kadcyla, Xgeva and Potassium well without complaints or incident.Calcium 9.3 and pt denied any tooth or jaw pain and no recent or future dental visits VSS upon discharge. Pt discharged via wheelchair in satisfactory condition accompanied by a friend

## 2017-06-12 NOTE — Progress Notes (Signed)
Diagnosis [name], stage 4, left (HCC) - Plan: CBC with Differential/Platelet, Comprehensive metabolic panel, Lactate dehydrogenase, DISCONTINUED: 0.9 %  sodium chloride infusion, DISCONTINUED: sodium chloride flush (NS) 0.9 % injection 10 mL, DISCONTINUED: heparin lock flush 100 unit/mL, DISCONTINUED: acetaminophen (TYLENOL) tablet 650 mg, DISCONTINUED: diphenhydrAMINE (BENADRYL) capsule 50 mg, DISCONTINUED: ado-trastuzumab emtansine (KADCYLA) 260 mg in sodium chloride 0.9 % 250 mL chemo infusion  Staging Cancer Staging [name], stage 4, left (HCC) Staging form: Breast, AJCC 8th Edition - Pathologic stage from 12/26/2015: Stage IIB (pT3, pN2a, cM0, G2, ER: Positive, PR: Positive, HER2: Negative) - Signed by Baird Cancer, PA-C on 02/15/2016 - Pathologic stage from 02/15/2016: Stage IV (pT3, pN2a, pM1, ER: Negative, PR: Negative, HER2: Positive) - Signed by Baird Cancer, PA-C on 02/28/2016   Assessment and Plan:  1.  [name], stage 4, left (HCC)62 y.o. 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  Receiving therapy with ado-trastuzumab (Kadcyla).  PET scan done 03/18/2017 shows no evidence of metastatic disease.  Previously, I discussed with her based on her history of stage IV breast cancer she will be recommended for ongoing therapy with intermittent imaging to document ongoing response to therapy.  The patient had a recent echo on 03/13/2016 which showed EF of 60-65%.  She will continue Kadcyla and is here today for C 14.    She will continue therapy as recommended and will RTC in 6 weeks for follow-up.  She is due for ECHO in May, 2019.  She will be set up for PET scan in 09/2017.  Labs reviewed, adequate for therapy.    2.   Bone metastases.  She will continue Xgeva every 6 weeks which was previously recommended by Dr. Lebron Conners.    3.  Hypertension.  Blood pressure is  126/73.  Follow-up with PCP as recommended.    4.  Emphysema.  This was noted on recent PET scan.  She is on chronic O2 therapy on 4 L.  She has a history of smoking but reportedly has stopped smoking.  Options will be for pulmonary referral for management if desired.   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   She has normal renal function and this is likely an incidental finding on scan.  6.  Hypokalemia.  K= 3.3 on labs done today 06/12/2017.  She is on oral potassium.  She will receive 20 meq Potassium IV in clinic.    7.  Neuropathy.  Will continue to monitor.  May have to consider Neurontin, Cymbalta or Lyrica.    Interval history: 62 y.o. 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  Receiving therapy with ado-trastuzumab (Kadcyla) and Xgeva.    She has a history of :  [name], stage 4, left (HCC) Stage IV (pT3pN2ApM1) invasive ductal carcinoma, biopsy proven to liver, HER2 POSITIVE, ER/PR NEGATIVE.  Complicated by a LEFT invasive lobular carcinoma, Stage IIB (pT3pN2A), S/P left mastectomy by Dr. Arnoldo Morale on 12/16/2015, ER/PR+ 2%. HER2 NEGATIVE. Done prior to knowing about Stage IV disease. AND History with right invasive ductalbreast cancer, Stage IIIC, ER/PR+ and HER 2 POSITIVEin 2012 managed with right mastectomy, systemic chemotherapy, HER2 targeted therapy, XRT, and anti-endocrine therapy   She started palliative XRT to T8 pathologic fracture on 02/27/2016.  Chemo with docetaxel/herceptin/perjeta for 6 cycles: 03/06/16-06/26/16.  Current status: Patient is seen today for  follow-up prior to cycle 14 of Kadcyla.  She reports she is scheduled for ECHO in May 2019.  She is tolerating treatment.   Denies complaints today other than neuropathy.       Stage III (T4N3) R Br cancer  (Resolved)   06/21/2010 Initial Diagnosis    Stage III (T4N3) R Br cancer       01/04/2011 - 02/22/2011 Radiation  Therapy    50 Gy       05/07/2014 Pathology Results    BCI testing- low risk of late recurrence (3.4% between years 5-10), high likelihood of benefit from extended endocrein therapy, and a 67% relative risk reduction when treated with extended endocrine therapy (27% versus 10.5%).       [name], stage 4, left ([place])   11/22/2015 Mammogram    Ultrasound-guided biopsy of the irregular hypoechoic area, with prominent shadowing, in the OUTER left breast at the 1 o'clock axis. 2. Ultrasound-guided biopsy of the irregular mass within the INNER left breast at the 9 o'clock axis. 3. Ultrasound-guided biopsy of 1 of the enlarged and amorphous lymph nodes in the left axilla.      11/22/2015 Imaging    Ultrasound-guided biopsy of the irregular hypoechoic area, with prominent shadowing, in the OUTER left breast at the 1 o'clock axis. 2. Ultrasound-guided biopsy of the irregular mass within the INNER left breast at the 9 o'clock axis. 3. Ultrasound-guided biopsy of 1 of the enlarged and amorphous lymph nodes in the left axilla.      12/16/2015 Surgery    L modified radical mastectomy with Dr. Arnoldo Morale      12/18/2015 - 12/21/2015 Hospital Admission    Admit date: 12/18/2015 Admission diagnosis: altered MS, acute respiratory failure Additional comments: This likely was a combination of COPD exacerbation along with mild acute on chronic diastolic CHF with  echo shows a preserved EF of around 60%, he was treated with IV steroids and Lasix, she is much better but qualifies for home oxygen which will be provided      12/19/2015 Imaging    No evidence of pulmonary embolus. 2. Small right pleural effusion, with associated atelectasis. Peripheral scarring at the anterior right upper lobe. Mild bilateral emphysema noted. 3. Scattered coronary artery calcifications seen. 4. Postoperative change at the left chest wall, reflecting recent mastectomy, with vague collections of  postoperative fluid seen. 5. Vague sclerotic change and heterogeneity at vertebral body T8 raises concern for sequelae of metastatic disease      01/23/2016 PET scan    1. Widespread multifocal metastatic disease in the visualized axial and appendicular skeleton. 2. Solitary metastatic lesion in the liver. Hypermetabolic irregular left axillary lymph node adjacent to the left axillary clips. 3. Activity in the left breast related to mastectomy. 4. Low-grade activity along the pleuroparenchymal thickening anteriorly in the right chest is probably related to prior therapy rather than current malignancy. 5. Probable mild compression fracture at T8 associated with the extensive tumor at this level. There is also bony destruction of the right ninth rib posteriorly. 6. Faintly hypermetabolic but small left external iliac lymph nodes merit surveillance  ADDENDUM: The original report was by Dr. Van Clines. The following addendum is by Dr. Van Clines:  I have one other musculoskeletal focus of hypermetabolic activity to mention. In the right latissimus dorsi muscle, there is a small hypermetabolic focus with maximum SUV of 5.4, measuring 7 mm in diameter on image 89/4, compatible with a metastatic deposit.  02/02/2016 Imaging    MRI T-spine: 1. Extensive metastatic disease throughout the cervical and thoracic spine with a subtle pathologic compression fracture of T8. Tumor extends through the posterior margin of the T8 vertebra into the spinal canal without spinal cord compression at this time. 2. No other pathologic fractures of the thoracic spine at this time.      02/10/2016 Procedure    US biopsy of liver lesion      02/15/2016 Pathology Results    Liver, needle/core biopsy, right lobe METASTATIC CARCINOMA, CONSISTENT WITH BREAST DUCTAL CARCINOMA.      02/15/2016 - 02/28/2016 Chemotherapy    Letrozole 2.5 mg daily + Kisqali (600 mg) days 1-21 every 28  days       02/21/2016 Pathology Results    Breast Prognostic profile from liver biopsy: HER2 POSITIVE, ER/PR NEGATIVE (0%).      02/27/2016 - 03/09/2016 Radiation Therapy    Palliative XRT to T7-T11 with Dr. Lianne Cure; 30 Gy in 10 fractions.       02/28/2016 Treatment Plan Change    Due to HER2 positivity and ER/PR negativity from liver biopsy, change to systemic chemotherapy is recommended.      03/06/2016 -  Chemotherapy    Docetaxel/Herceptin/Perjeta with Neulasta support.      03/11/2016 - 03/15/2016 Hospital Admission    Admit date: 03/11/2016 Admission diagnosis: Febrile neutropenia Additional comments: COPD exacerbation      03/27/2016 Treatment Plan Change    Docetaxel dose reduced by 20%.      03/27/2016 Adverse Reaction    Demonstrating early signs of palmar-plantar erythrodysesthesia.      03/27/2016 Treatment Plan Change    Defer treatment 1 week.      04/10/2016 Imaging    MUGA- The left ventricular ejection fraction is equal to 56.2%. Normal left ventricular wall motion.      06/04/2016 PET scan    1. No residual hypermetabolism within previously hypermetabolic lesions in the left axilla, right latissimus dorsi muscle, anterior right pleural space, liver, left external iliac lymph node and bones. 2. Aortic atherosclerosis (ICD10-170.0). Coronary artery calcification.      07/25/2016 Treatment Plan Change    Started Nerlynx      09/03/2016 PET scan     Interval development of small to moderate right pleural effusion associated with a linear band of hypermetabolism in the posterior right costophrenic sulcus ( SUV max = 5.7) . Imaging features are concerning for recurrent posterior right pleural disease.      09/06/2016 Treatment Plan Change    Stopped Nerlyn due to evidence of recurrent posterior right pleural disease.       09/11/2016 Treatment Plan Change    Started Kadcyla      11/30/2016 PET scan    IMPRESSION: 1. Reduction in volume of RIGHT pleural  effusion and resolution of metabolic activity RIGHT at lung base most consistent with resolving inflammatory or infectious process. 2. No evidence disease progression. 3. Multiple sclerotic skeletal metastasis throughout the skeleton. Several foci of mild metabolic activity interspersed. No significant change.         Problem List Patient Active Problem List   Diagnosis Date Noted  . Febrile neutropenia ([place]) [D70.9, R50.81] 03/11/2016  . COPD (chronic obstructive pulmonary disease) ([place]) [J44.9] 03/11/2016  . Goals of care, counseling/discussion [Z71.89] 02/29/2016  . Bone metastases ([place]) [C79.51] 02/15/2016  . Elevated troponin [R74.8]   . Altered mental status [R41.82] 12/18/2015  . [name], stage 4, left ([place]) [C50.912]  12/16/2015  . Tobacco abuse [Z72.0] 06/22/2015  . Vitamin D deficiency [E55.9] 04/16/2014  . GAD (generalized anxiety disorder) [F41.1] 04/16/2014  . GERD (gastroesophageal reflux disease) [K21.9] 04/16/2014  . Osteopenia [M85.80] 11/18/2013  . Lymphedema of arm [I89.0] 10/14/2012  . Shortness of breath [R06.02] 03/11/2011  . Hypertension [I10] 09/06/2010  . Hyperlipidemia [E78.5] 09/06/2010    Past Medical History Past Medical History:  Diagnosis Date  . Anxiety   . Bone metastases ([place]) 02/15/2016  . Breast CA ([place]) 07/01/2010   rt breast ca  . COPD (chronic obstructive pulmonary disease) ([place])   . Family history of breast cancer    Aunt on father's side  . Hyperlipemia   . Hypertension   . MRSA (methicillin resistant staph aureus) culture positive    in breast  . Neuropathy    bilateral toes  . Nipple discharge    with pain, infection, lump  . S/P radiation therapy 01/08/11 - 02/22/11   Right Chest Wall/ 50 Gy / 25 Fractions, Right [place]/ 50 gy/25 Fractions, Right Axillary Boost/500 cGy/25 Fractions, Right Chest Wall Boost/10 Gy/5 Fractions  . Stage III (T4N3) R Br cancer  06/21/2010   Letrozole 2.5 mg which  she started on 03/09/2011.  This patient presented to my office on Jun 21, 2010 with a large ulcerated right breast mass and bleeding. She underwent additional workup at that time and a biopsy was done showing invasive breast cancer. She was staged and was found to have a T4  disease that was ER, PR, HER-2 positive. CA 2729 was normal at 31. She is undergoing chemotherapy wi  . Status post chemotherapy    6 cycles of carboplatin and docetaxel with trastuzumab every 21 days followed by surgery  . Tobacco abuse 06/22/2015  . Use of letrozole ([place]) 03/09/2011  . Wears dentures     Past Surgical History Past Surgical History:  Procedure Laterality Date  . BREAST SURGERY  2012  . DILATION AND CURETTAGE OF UTERUS    . MASTECTOMY MODIFIED RADICAL Left 12/16/2015   Procedure: MASTECTOMY MODIFIED RADICAL;  Surgeon: Aviva Signs, MD;  Location: [facility];  Service: General;  Laterality: Left;  . port a catheter insertion    . PORT-A-CATH REMOVAL Left 01/15/2013   Procedure: REMOVAL PORT-A-CATH;  Surgeon: Scherry Ran, MD;  Location: [facility];  Service: General;  Laterality: Left;  . PORTACATH PLACEMENT Right 01/16/2016   Procedure: INSERTION PORT-A-CATH;  Surgeon: Aviva Signs, MD;  Location: [facility];  Service: General;  Laterality: Right;  . TUBAL LIGATION      Family History Family History  Problem Relation Age of Onset  . COPD Mother   . Diabetes Father   . Hypertension Father   . COPD Brother   . Cancer Maternal Aunt        ovarian - died of old age  . Cancer Paternal Aunt        had breast ca; died of of a different cancer  . Lung cancer Brother      Social History  reports that she quit smoking about 17 months ago. Her smoking use included cigarettes. She has a 7.50 pack-year smoking history. She has never used smokeless tobacco. She reports that she does not drink alcohol or use drugs.  Medications  Current Outpatient Medications:  .  Ado-Trastuzumab Emtansine (KADCYLA IV),  Inject into the vein. Every 3 weeks, Disp: , Rfl:  .  albuterol (PROVENTIL HFA;VENTOLIN HFA) 108 (90 Base) MCG/ACT inhaler, Inhale 2  puffs into the lungs every 6 (six) hours as needed for wheezing or shortness of breath., Disp: , Rfl:  .  albuterol (PROVENTIL) (2.5 MG/3ML) 0.083% nebulizer solution, Inhale 3 mLs into the lungs every 6 (six) hours as needed for wheezing or shortness of breath., Disp: 75 mL, Rfl: 12 .  CARAFATE 1 GM/10ML suspension, TAKE 10 MLS BY MOUTH 4 TIMES DAILY WITH MEALS AND AT BEDTIME, Disp: 420 mL, Rfl: 2 .  cholecalciferol (VITAMIN D) 1000 units tablet, Take 2,000 Units by mouth daily., Disp: , Rfl:  .  fexofenadine (ALLEGRA) 180 MG tablet, Take 180 mg by mouth daily.  , Disp: , Rfl:  .  furosemide (LASIX) 40 MG tablet, TAKE 1 TABLET BY MOUTH ONCE DAILY, Disp: 90 tablet, Rfl: 0 .  Ginkgo Biloba 120 MG TABS, Take 120 mg by mouth daily., Disp: , Rfl:  .  KLOR-CON M20 20 MEQ tablet, TAKE 1 TABLET BY MOUTH THREE TIMES DAILY, Disp: 180 tablet, Rfl: 0 .  lidocaine-prilocaine (EMLA) cream, Apply a quarter size amount to affected area 1 hour prior to coming to chemotherapy., Disp: 30 g, Rfl: 2 .  loperamide (IMODIUM A-D) 2 MG tablet, Days 1-14 take 4 mg 3 times a day  Days 15-56 take 4 mg 2 times a day  Days 57-365 take 4 mg as needed (max 16 mg/day), Disp: 120 tablet, Rfl: 3 .  LORazepam (ATIVAN) 0.5 MG tablet, Take 1 tablet (0.5 mg total) by mouth every 8 (eight) hours., Disp: 90 tablet, Rfl: 2 .  losartan (COZAAR) 100 MG tablet, Take 0.5 tablets (50 mg total) by mouth daily., Disp: 90 tablet, Rfl: 1 .  magic mouthwash w/lidocaine SOLN, Take 5 mLs by mouth 4 (four) times daily as needed for mouth pain., Disp: 240 mL, Rfl: 1 .  Multiple Vitamins-Minerals (EMERGEN-C IMMUNE PO), Take 1 tablet by mouth daily., Disp: , Rfl:  .  naproxen sodium (ANAPROX) 220 MG tablet, Take 220 mg by mouth 2 (two) times daily as needed (pain). pain, Disp: , Rfl:  .  omeprazole (PRILOSEC OTC) 20 MG  tablet, Take 40 mg by mouth daily., Disp: , Rfl:  .  prochlorperazine (COMPAZINE) 10 MG tablet, Take 1 tablet (10 mg total) by mouth every 6 (six) hours as needed for nausea or vomiting., Disp: 30 tablet, Rfl: 2 .  simethicone (MYLICON) 80 MG chewable tablet, Chew 2 tablets (160 mg total) by mouth 4 (four) times daily as needed for flatulence., Disp: 30 tablet, Rfl: 0 .  SYMBICORT 160-4.5 MCG/ACT inhaler, INHALE TWO PUFFS BY MOUTH TWICE DAILY, Disp: 6 g, Rfl: 5 No current facility-administered medications for this visit.   Facility-Administered Medications Ordered in Other Visits:  .  0.9 %  sodium chloride infusion, , Intravenous, Once, Sherrina Zaugg, Mathis Dad, MD .  acetaminophen (TYLENOL) tablet 650 mg, 650 mg, Oral, Once, Keiarra Charon, Mathis Dad, MD .  ado-trastuzumab emtansine (KADCYLA) 260 mg in sodium chloride 0.9 % 250 mL chemo infusion, 3.9 mg/kg (Treatment Plan Recorded), Intravenous, Once, Mouhamad Teed, MD .  denosumab (XGEVA) injection 120 mg, 120 mg, Subcutaneous, Once, Perlov, Mikhail G, MD .  diphenhydrAMINE (BENADRYL) capsule 50 mg, 50 mg, Oral, Once, Brittanyann Wittner, MD .  heparin lock flush 100 unit/mL, 500 Units, Intravenous, Once, Twana First, MD .  heparin lock flush 100 unit/mL, 500 Units, Intracatheter, Once PRN, Mallisa Al[place]a, Mathis Dad, MD .  potassium chloride 10 mEq in 100 mL IVPB, 10 mEq, Intravenous, Q1 Hr x 2, Nadelyn Enriques, MD .  sodium chloride flush (NS) 0.9 %  injection 10 mL, 10 mL, Intravenous, PRN, Twana First, MD .  sodium chloride flush (NS) 0.9 % injection 10 mL, 10 mL, Intracatheter, PRN, Higgs, Mathis Dad, MD  Allergies Patient has no known allergies.  Review of Systems Review of Systems - Oncology ROS as per HPI otherwise 12 point ROS is negative other than neuropathy.     Physical Exam  Vitals Wt Readings from Last 3 Encounters:  06/12/17 152 lb (68.9 kg)  05/22/17 154 lb 12.8 oz (70.2 kg)  05/01/17 153 lb 3.2 oz (69.5 kg)   Temp Readings from Last 3 Encounters:  06/12/17 98.1 F  (36.7 C) (Oral)  05/22/17 98.4 F (36.9 C) (Oral)  05/01/17 97.8 F (36.6 C) (Oral)   BP Readings from Last 3 Encounters:  06/12/17 126/73  05/22/17 116/69  05/01/17 112/64   Pulse Readings from Last 3 Encounters:  06/12/17 85  05/22/17 79  05/01/17 78    Constitutional: Well-developed, well-nourished, and in no distress.   HENT: Head: Normocephalic and atraumatic.  Mouth/Throat: No oropharyngeal exudate. Mucosa moist. Eyes: Pupils are equal, round, and reactive to light. Conjunctivae are normal. No scleral icterus.  Neck: Normal range of motion. Neck supple. No JVD present.  Cardiovascular: Normal rate, regular rhythm and normal heart sounds.  Exam reveals no gallop and no friction rub.   No murmur heard. Pulmonary/Chest: Effort normal and breath sounds normal. No respiratory distress. No wheezes.No rales.  Abdominal: Soft. Bowel sounds are normal. No distension. There is no tenderness. There is no guarding.  Musculoskeletal: No edema or tenderness.  Lymphadenopathy: No cervical, axillary or supraclavicular adenopathy.  Neurological: Alert and oriented to person, place, and time. No cranial nerve deficit.  Skin: Skin is warm and dry. No rash noted. No erythema. No pallor.  Psychiatric: Affect and judgment normal.  Bilateral breast exam:  Chaperone present.  Bilateral mastectomy.    Labs Infusion on 06/12/2017  Component Date Value Ref Range Status  . LDH 06/12/2017 175  98 - 192 U/L Final   Performed at [facility], [address], [place], [place] [address]  . Sodium 06/12/2017 136  135 - 145 mmol/L Final  . Potassium 06/12/2017 3.3* 3.5 - 5.1 mmol/L Final  . Chloride 06/12/2017 90* 101 - 111 mmol/L Final  . CO2 06/12/2017 36* 22 - 32 mmol/L Final  . Glucose, Bld 06/12/2017 128* 65 - 99 mg/dL Final  . BUN 06/12/2017 11  6 - 20 mg/dL Final  . Creatinine, Ser 06/12/2017 0.55  0.44 - 1.00 mg/dL Final  . Calcium 06/12/2017 9.3  8.9 - 10.3 mg/dL Final  . Total Protein  06/12/2017 7.5  6.5 - 8.1 g/dL Final  . Albumin 06/12/2017 3.4* 3.5 - 5.0 g/dL Final  . AST 06/12/2017 50* 15 - 41 U/L Final  . ALT 06/12/2017 38  14 - 54 U/L Final  . Alkaline Phosphatase 06/12/2017 120  38 - 126 U/L Final  . Total Bilirubin 06/12/2017 0.5  0.3 - 1.2 mg/dL Final  . GFR calc non Af Amer 06/12/2017 >60  >60 mL/min Final  . GFR calc Af Amer 06/12/2017 >60  >60 mL/min Final   Comment: (NOTE) The eGFR has been calculated using the CKD EPI equation. This calculation has not been validated in all clinical situations. eGFR's persistently <60 mL/min signify possible Chronic Kidney Disease.   Georgiann Hahn gap 06/12/2017 10  5 - 15 Final   Performed at [facility], [address], [place], [place] [address]  . WBC 06/12/2017 9.5  4.0 - 10.5 K/uL Final  . RBC 06/12/2017 4.25  3.87 - 5.11 MIL/uL Final  . Hemoglobin 06/12/2017 11.2* 12.0 - 15.0 g/dL Final  . HCT 06/12/2017 37.8  36.0 - 46.0 % Final  . MCV 06/12/2017 88.9  78.0 - 100.0 fL Final  . MCH 06/12/2017 26.4  26.0 - 34.0 pg Final  . MCHC 06/12/2017 29.6* 30.0 - 36.0 g/dL Final  . RDW 06/12/2017 17.8* 11.5 - 15.5 % Final  . Platelets 06/12/2017 230  150 - 400 K/uL Final  . Neutrophils Relative % 06/12/2017 67  % Final  . Neutro Abs 06/12/2017 6.4  1.7 - 7.7 K/uL Final  . Lymphocytes Relative 06/12/2017 20  % Final  . Lymphs Abs 06/12/2017 2.0  0.7 - 4.0 K/uL Final  . Monocytes Relative 06/12/2017 11  % Final  . Monocytes Absolute 06/12/2017 1.0  0.1 - 1.0 K/uL Final  . Eosinophils Relative 06/12/2017 2  % Final  . Eosinophils Absolute 06/12/2017 0.1  0.0 - 0.7 K/uL Final  . Basophils Relative 06/12/2017 0  % Final  . Basophils Absolute 06/12/2017 0.0  0.0 - 0.1 K/uL Final   Performed at [facility], [address] [place], [place] [address]     Pathology Orders Placed This Encounter  Procedures  . CBC with Differential/Platelet    Standing Status:   Future    Standing Expiration Date:   06/13/2018  . Comprehensive  metabolic panel    Standing Status:   Future    Standing Expiration Date:   06/13/2018  . Lactate dehydrogenase    Standing Status:   Future    Standing Expiration Date:   06/13/2018       Zoila Shutter MD

## 2017-06-12 NOTE — Patient Instructions (Signed)
[facility] Discharge Instructions for Patients Receiving Chemotherapy   Beginning January 23rd 2017 lab work for the [facility] will be done in the  Main lab at [facility] on 1st floor. If you have a lab appointment with the [facility] please come in thru the  Main Entrance and check in at the main information desk   Today you received the following chemotherapy agents Kadcyla as well as Xgeva injection and Potassium infusions. Follow-up as scheduled. Call clinic for any questions or concerns  To help prevent nausea and vomiting after your treatment, we encourage you to take your nausea medication   If you develop nausea and vomiting, or diarrhea that is not controlled by your medication, call the clinic.  The clinic phone number is [phone]. Office hours are Monday-Friday 8:30am-5:00pm.  BELOW ARE SYMPTOMS THAT SHOULD BE REPORTED IMMEDIATELY:  *FEVER GREATER THAN 101.0 F  *CHILLS WITH OR WITHOUT FEVER  NAUSEA AND VOMITING THAT IS NOT CONTROLLED WITH YOUR NAUSEA MEDICATION  *UNUSUAL SHORTNESS OF BREATH  *UNUSUAL BRUISING OR BLEEDING  TENDERNESS IN MOUTH AND THROAT WITH OR WITHOUT PRESENCE OF ULCERS  *URINARY PROBLEMS  *BOWEL PROBLEMS  UNUSUAL RASH Items with * indicate a potential emergency and should be followed up as soon as possible. If you have an emergency after office hours please contact your primary care physician or go to the nearest emergency department.  Please call the clinic during office hours if you have any questions or concerns.   You may also contact the Patient Navigator at [phone] should you have any questions or need assistance in obtaining follow up care.      Resources For Cancer Patients and their Caregivers ? American Cancer Society: Can assist with transportation, wigs, general needs, runs Look Good Feel Better.        971-239-9691 ? Cancer Care: Provides financial assistance, online support groups,  medication/co-pay assistance.  1-800-813-HOPE 8281187417) ? [facility] Assists [place] cancer patients and their families through emotional , educational and financial support.  715-844-6438 ? [place] DSS Where to apply for food stamps, Medicaid and utility assistance. (309) 496-4593 ? [employer]: Transportation to medical appointments. 360-835-3377 ? Social Security Administration: May apply for disability if have a Stage IV cancer. 217-773-4027 (970) 772-3925 ? [employer], Disability and Transit Services: Assists with nutrition, care and transit needs. ([phone])078-5122

## 2017-06-17 ENCOUNTER — Ambulatory Visit (HOSPITAL_COMMUNITY)
Admission: RE | Admit: 2017-06-17 | Discharge: 2017-06-17 | Disposition: A | Discharge status: Home / Self Care | Payer: Managed Care, Other (non HMO) | Source: Ambulatory Visit | Attending: Internal Medicine | Admitting: Internal Medicine

## 2017-06-17 ENCOUNTER — Other Ambulatory Visit (HOSPITAL_COMMUNITY): Payer: Self-pay | Admitting: Internal Medicine

## 2017-06-17 DIAGNOSIS — I083 Combined rheumatic disorders of mitral, aortic and tricuspid valves: Secondary | ICD-10-CM | POA: Diagnosis not present

## 2017-06-17 DIAGNOSIS — Z720 Tobacco use: Secondary | ICD-10-CM | POA: Insufficient documentation

## 2017-06-17 DIAGNOSIS — Z9013 Acquired absence of bilateral breasts and nipples: Secondary | ICD-10-CM | POA: Insufficient documentation

## 2017-06-17 DIAGNOSIS — I119 Hypertensive heart disease without heart failure: Secondary | ICD-10-CM | POA: Diagnosis not present

## 2017-06-17 DIAGNOSIS — J449 Chronic obstructive pulmonary disease, unspecified: Secondary | ICD-10-CM | POA: Insufficient documentation

## 2017-06-17 DIAGNOSIS — C50912 Malignant neoplasm of unspecified site of left female breast: Secondary | ICD-10-CM

## 2017-06-17 NOTE — Progress Notes (Signed)
  Echocardiogram 2D Echocardiogram has been performed.  Sheila Garrison 06/17/2017, 10:06 AM

## 2017-07-03 ENCOUNTER — Other Ambulatory Visit (HOSPITAL_COMMUNITY): Payer: Self-pay | Admitting: Pharmacist

## 2017-07-03 ENCOUNTER — Encounter (HOSPITAL_COMMUNITY): Payer: Self-pay

## 2017-07-03 ENCOUNTER — Other Ambulatory Visit: Payer: Self-pay

## 2017-07-03 ENCOUNTER — Other Ambulatory Visit (HOSPITAL_COMMUNITY): Payer: Self-pay | Admitting: Internal Medicine

## 2017-07-03 ENCOUNTER — Inpatient Hospital Stay (HOSPITAL_COMMUNITY): Payer: Managed Care, Other (non HMO)

## 2017-07-03 ENCOUNTER — Ambulatory Visit (HOSPITAL_COMMUNITY): Payer: Managed Care, Other (non HMO)

## 2017-07-03 VITALS — BP 114/49 | HR 81 | Temp 98.0°F | Resp 20 | Wt 154.2 lb

## 2017-07-03 DIAGNOSIS — C50912 Malignant neoplasm of unspecified site of left female breast: Secondary | ICD-10-CM

## 2017-07-03 DIAGNOSIS — Z5112 Encounter for antineoplastic immunotherapy: Secondary | ICD-10-CM | POA: Diagnosis not present

## 2017-07-03 LAB — CBC WITH DIFFERENTIAL/PLATELET
BASOS PCT: 0 %
Basophils Absolute: 0 10*3/uL (ref 0.0–0.1)
EOS PCT: 2 %
Eosinophils Absolute: 0.2 10*3/uL (ref 0.0–0.7)
HCT: 36.6 % (ref 36.0–46.0)
HEMOGLOBIN: 10.9 g/dL — AB (ref 12.0–15.0)
LYMPHS ABS: 2.1 10*3/uL (ref 0.7–4.0)
Lymphocytes Relative: 20 %
MCH: 26.7 pg (ref 26.0–34.0)
MCHC: 29.8 g/dL — AB (ref 30.0–36.0)
MCV: 89.5 fL (ref 78.0–100.0)
MONOS PCT: 11 %
Monocytes Absolute: 1.2 10*3/uL — ABNORMAL HIGH (ref 0.1–1.0)
NEUTROS PCT: 67 %
Neutro Abs: 7.4 10*3/uL (ref 1.7–7.7)
PLATELETS: 220 10*3/uL (ref 150–400)
RBC: 4.09 MIL/uL (ref 3.87–5.11)
RDW: 17.1 % — ABNORMAL HIGH (ref 11.5–15.5)
WBC: 11 10*3/uL — AB (ref 4.0–10.5)

## 2017-07-03 LAB — COMPREHENSIVE METABOLIC PANEL
ALK PHOS: 119 U/L (ref 38–126)
ALT: 53 U/L (ref 14–54)
AST: 62 U/L — AB (ref 15–41)
Albumin: 3.2 g/dL — ABNORMAL LOW (ref 3.5–5.0)
Anion gap: 9 (ref 5–15)
BUN: 16 mg/dL (ref 6–20)
CALCIUM: 9.2 mg/dL (ref 8.9–10.3)
CHLORIDE: 94 mmol/L — AB (ref 101–111)
CO2: 36 mmol/L — ABNORMAL HIGH (ref 22–32)
CREATININE: 0.6 mg/dL (ref 0.44–1.00)
Glucose, Bld: 114 mg/dL — ABNORMAL HIGH (ref 65–99)
Potassium: 3.8 mmol/L (ref 3.5–5.1)
Sodium: 139 mmol/L (ref 135–145)
Total Bilirubin: 0.5 mg/dL (ref 0.3–1.2)
Total Protein: 7.3 g/dL (ref 6.5–8.1)

## 2017-07-03 LAB — LACTATE DEHYDROGENASE: LDH: 166 U/L (ref 98–192)

## 2017-07-03 MED ORDER — HEPARIN SOD (PORK) LOCK FLUSH 100 UNIT/ML IV SOLN
500.0000 [IU] | Freq: Once | INTRAVENOUS | Status: AC | PRN
  Administered 2017-07-03: 500 [IU]
  Filled 2017-07-03: qty 5

## 2017-07-03 MED ORDER — SODIUM CHLORIDE 0.9% FLUSH
10.0000 mL | INTRAVENOUS | Status: DC | PRN
  Administered 2017-07-03: 10 mL
  Filled 2017-07-03: qty 10

## 2017-07-03 MED ORDER — DIPHENHYDRAMINE HCL 25 MG PO CAPS
50.0000 mg | ORAL_CAPSULE | Freq: Once | ORAL | Status: DC
  Filled 2017-07-03: qty 2

## 2017-07-03 MED ORDER — ACETAMINOPHEN 325 MG PO TABS
650.0000 mg | ORAL_TABLET | Freq: Once | ORAL | Status: DC
  Filled 2017-07-03: qty 2

## 2017-07-03 MED ORDER — SODIUM CHLORIDE 0.9 % IV SOLN
3.9000 mg/kg | Freq: Once | INTRAVENOUS | Status: AC
  Administered 2017-07-03: 260 mg via INTRAVENOUS
  Filled 2017-07-03: qty 8

## 2017-07-03 MED ORDER — SODIUM CHLORIDE 0.9 % IV SOLN
Freq: Once | INTRAVENOUS | Status: AC
  Administered 2017-07-03: 15:00:00 via INTRAVENOUS

## 2017-07-03 NOTE — Patient Instructions (Signed)
[facility] Discharge Instructions for Patients Receiving Chemotherapy   Beginning January 23rd 2017 lab work for the Cancer Center will be done in the  Main lab at [facility] on 1st floor. If you have a lab appointment with the Cancer Center please come in thru the  Main Entrance and check in at the main information desk   Today you received the following chemotherapy agents   To help prevent nausea and vomiting after your treatment, we encourage you to take your nausea medication     If you develop nausea and vomiting, or diarrhea that is not controlled by your medication, call the clinic.  The clinic phone number is [phone]. Office hours are Monday-Friday 8:30am-5:00pm.  BELOW ARE SYMPTOMS THAT SHOULD BE REPORTED IMMEDIATELY:  *FEVER GREATER THAN 101.0 F  *CHILLS WITH OR WITHOUT FEVER  NAUSEA AND VOMITING THAT IS NOT CONTROLLED WITH YOUR NAUSEA MEDICATION  *UNUSUAL SHORTNESS OF BREATH  *UNUSUAL BRUISING OR BLEEDING  TENDERNESS IN MOUTH AND THROAT WITH OR WITHOUT PRESENCE OF ULCERS  *URINARY PROBLEMS  *BOWEL PROBLEMS  UNUSUAL RASH Items with * indicate a potential emergency and should be followed up as soon as possible. If you have an emergency after office hours please contact your primary care physician or go to the nearest emergency department.  Please call the clinic during office hours if you have any questions or concerns.   You may also contact the Patient Navigator at [phone] should you have any questions or need assistance in obtaining follow up care.      Resources For Cancer Patients and their Caregivers ? American Cancer Society: Can assist with transportation, wigs, general needs, runs Look Good Feel Better.        1-888-227-6333 ? Cancer Care: Provides financial assistance, online support groups, medication/co-pay assistance.  1-800-813-HOPE (4673) ? [facility] Assists [place] cancer  patients and their families through emotional , educational and financial support.  336-427-4357 ? [place] DSS Where to apply for food stamps, Medicaid and utility assistance. 336-342-1394 ? [employer]: Transportation to medical appointments. 336-347-2287 ? Social Security Administration: May apply for disability if have a Stage IV cancer. 336-342-7796 1-800-772-1213 ? [place] [employer], Disability and Transit Services: Assists with nutrition, care and transit needs. 336-349-2343         

## 2017-07-03 NOTE — Progress Notes (Signed)
Labs met parameters for treatment today.  Treatment given per orders. Patient tolerated it well without problems. Vitals stable and discharged home from clinic ambulatory. Follow up as scheduled.

## 2017-07-12 ENCOUNTER — Other Ambulatory Visit: Payer: Self-pay | Admitting: Family

## 2017-07-24 ENCOUNTER — Inpatient Hospital Stay (HOSPITAL_COMMUNITY): Payer: Managed Care, Other (non HMO)

## 2017-07-24 ENCOUNTER — Inpatient Hospital Stay (HOSPITAL_COMMUNITY): Payer: Managed Care, Other (non HMO) | Attending: Hematology & Oncology | Admitting: Internal Medicine

## 2017-07-24 ENCOUNTER — Encounter (HOSPITAL_COMMUNITY): Payer: Self-pay

## 2017-07-24 ENCOUNTER — Other Ambulatory Visit: Payer: Self-pay

## 2017-07-24 ENCOUNTER — Other Ambulatory Visit (HOSPITAL_COMMUNITY): Payer: Self-pay

## 2017-07-24 VITALS — BP 105/48 | HR 79 | Temp 98.0°F | Resp 18 | Wt 151.9 lb

## 2017-07-24 DIAGNOSIS — Z923 Personal history of irradiation: Secondary | ICD-10-CM | POA: Diagnosis not present

## 2017-07-24 DIAGNOSIS — E559 Vitamin D deficiency, unspecified: Secondary | ICD-10-CM | POA: Diagnosis not present

## 2017-07-24 DIAGNOSIS — Z87891 Personal history of nicotine dependence: Secondary | ICD-10-CM | POA: Diagnosis not present

## 2017-07-24 DIAGNOSIS — C50912 Malignant neoplasm of unspecified site of left female breast: Secondary | ICD-10-CM

## 2017-07-24 DIAGNOSIS — I890 Lymphedema, not elsewhere classified: Secondary | ICD-10-CM | POA: Insufficient documentation

## 2017-07-24 DIAGNOSIS — I2510 Atherosclerotic heart disease of native coronary artery without angina pectoris: Secondary | ICD-10-CM

## 2017-07-24 DIAGNOSIS — K219 Gastro-esophageal reflux disease without esophagitis: Secondary | ICD-10-CM | POA: Insufficient documentation

## 2017-07-24 DIAGNOSIS — C7951 Secondary malignant neoplasm of bone: Secondary | ICD-10-CM

## 2017-07-24 DIAGNOSIS — I722 Aneurysm of renal artery: Secondary | ICD-10-CM | POA: Diagnosis not present

## 2017-07-24 DIAGNOSIS — Z5111 Encounter for antineoplastic chemotherapy: Secondary | ICD-10-CM

## 2017-07-24 DIAGNOSIS — M8580 Other specified disorders of bone density and structure, unspecified site: Secondary | ICD-10-CM | POA: Diagnosis not present

## 2017-07-24 DIAGNOSIS — E785 Hyperlipidemia, unspecified: Secondary | ICD-10-CM

## 2017-07-24 DIAGNOSIS — I700 Atherosclerosis of aorta: Secondary | ICD-10-CM | POA: Diagnosis not present

## 2017-07-24 DIAGNOSIS — I10 Essential (primary) hypertension: Secondary | ICD-10-CM | POA: Insufficient documentation

## 2017-07-24 DIAGNOSIS — J90 Pleural effusion, not elsewhere classified: Secondary | ICD-10-CM | POA: Diagnosis not present

## 2017-07-24 DIAGNOSIS — C787 Secondary malignant neoplasm of liver and intrahepatic bile duct: Secondary | ICD-10-CM | POA: Insufficient documentation

## 2017-07-24 DIAGNOSIS — D709 Neutropenia, unspecified: Secondary | ICD-10-CM | POA: Diagnosis not present

## 2017-07-24 DIAGNOSIS — Z9012 Acquired absence of left breast and nipple: Secondary | ICD-10-CM | POA: Insufficient documentation

## 2017-07-24 DIAGNOSIS — Z170 Estrogen receptor positive status [ER+]: Secondary | ICD-10-CM

## 2017-07-24 DIAGNOSIS — R4182 Altered mental status, unspecified: Secondary | ICD-10-CM | POA: Diagnosis not present

## 2017-07-24 DIAGNOSIS — J439 Emphysema, unspecified: Secondary | ICD-10-CM | POA: Diagnosis not present

## 2017-07-24 DIAGNOSIS — R5081 Fever presenting with conditions classified elsewhere: Secondary | ICD-10-CM | POA: Insufficient documentation

## 2017-07-24 DIAGNOSIS — Z5112 Encounter for antineoplastic immunotherapy: Secondary | ICD-10-CM | POA: Diagnosis present

## 2017-07-24 DIAGNOSIS — E876 Hypokalemia: Secondary | ICD-10-CM

## 2017-07-24 DIAGNOSIS — F411 Generalized anxiety disorder: Secondary | ICD-10-CM | POA: Insufficient documentation

## 2017-07-24 LAB — CBC WITH DIFFERENTIAL/PLATELET
BASOS PCT: 0 %
Basophils Absolute: 0 10*3/uL (ref 0.0–0.1)
Eosinophils Absolute: 0.3 10*3/uL (ref 0.0–0.7)
Eosinophils Relative: 3 %
HEMATOCRIT: 36 % (ref 36.0–46.0)
HEMOGLOBIN: 10.7 g/dL — AB (ref 12.0–15.0)
LYMPHS PCT: 18 %
Lymphs Abs: 1.4 10*3/uL (ref 0.7–4.0)
MCH: 26.8 pg (ref 26.0–34.0)
MCHC: 29.7 g/dL — AB (ref 30.0–36.0)
MCV: 90 fL (ref 78.0–100.0)
MONO ABS: 1.2 10*3/uL — AB (ref 0.1–1.0)
MONOS PCT: 16 %
NEUTROS ABS: 4.7 10*3/uL (ref 1.7–7.7)
Neutrophils Relative %: 63 %
Platelets: 202 10*3/uL (ref 150–400)
RBC: 4 MIL/uL (ref 3.87–5.11)
RDW: 17.1 % — AB (ref 11.5–15.5)
WBC: 7.5 10*3/uL (ref 4.0–10.5)

## 2017-07-24 LAB — COMPREHENSIVE METABOLIC PANEL
ALBUMIN: 3.1 g/dL — AB (ref 3.5–5.0)
ALT: 40 U/L (ref 14–54)
ANION GAP: 10 (ref 5–15)
AST: 52 U/L — ABNORMAL HIGH (ref 15–41)
Alkaline Phosphatase: 118 U/L (ref 38–126)
BUN: 17 mg/dL (ref 6–20)
CALCIUM: 9.6 mg/dL (ref 8.9–10.3)
CHLORIDE: 95 mmol/L — AB (ref 101–111)
CO2: 33 mmol/L — AB (ref 22–32)
Creatinine, Ser: 0.61 mg/dL (ref 0.44–1.00)
GFR calc non Af Amer: >60 mL/min (ref >60)
GLUCOSE: 164 mg/dL — AB (ref 65–99)
POTASSIUM: 3.9 mmol/L (ref 3.5–5.1)
SODIUM: 138 mmol/L (ref 135–145)
Total Bilirubin: 0.4 mg/dL (ref 0.3–1.2)
Total Protein: 7.3 g/dL (ref 6.5–8.1)

## 2017-07-24 LAB — LACTATE DEHYDROGENASE: LDH: 167 U/L (ref 98–192)

## 2017-07-24 MED ORDER — DIPHENHYDRAMINE HCL 25 MG PO CAPS
50.0000 mg | ORAL_CAPSULE | Freq: Once | ORAL | Status: DC
  Filled 2017-07-24: qty 2

## 2017-07-24 MED ORDER — ACETAMINOPHEN 325 MG PO TABS
650.0000 mg | ORAL_TABLET | Freq: Once | ORAL | Status: DC
  Filled 2017-07-24: qty 2

## 2017-07-24 MED ORDER — POTASSIUM CHLORIDE CRYS ER 20 MEQ PO TBCR: 20.0000 meq | EXTENDED_RELEASE_TABLET | Freq: Three times a day (TID) | ORAL | 0 refills | Status: AC

## 2017-07-24 MED ORDER — SODIUM CHLORIDE 0.9 % IV SOLN
Freq: Once | INTRAVENOUS | Status: AC
  Administered 2017-07-24: 13:00:00 via INTRAVENOUS

## 2017-07-24 MED ORDER — HEPARIN SOD (PORK) LOCK FLUSH 100 UNIT/ML IV SOLN
500.0000 [IU] | Freq: Once | INTRAVENOUS | Status: AC | PRN
  Administered 2017-07-24: 500 [IU]
  Filled 2017-07-24: qty 5

## 2017-07-24 MED ORDER — SODIUM CHLORIDE 0.9 % IV SOLN
3.9000 mg/kg | Freq: Once | INTRAVENOUS | Status: AC
  Administered 2017-07-24: 260 mg via INTRAVENOUS
  Filled 2017-07-24: qty 5

## 2017-07-24 MED ORDER — SODIUM CHLORIDE 0.9% FLUSH
10.0000 mL | INTRAVENOUS | Status: DC | PRN
  Administered 2017-07-24: 10 mL
  Filled 2017-07-24: qty 10

## 2017-07-24 MED ORDER — DENOSUMAB 120 MG/1.7ML ~~LOC~~ SOLN
120.0000 mg | Freq: Once | SUBCUTANEOUS | Status: AC
Start: 2017-07-24 — End: 2017-07-24
  Administered 2017-07-24: 120 mg via SUBCUTANEOUS
  Filled 2017-07-24: qty 1.7

## 2017-07-24 NOTE — Patient Instructions (Signed)
[facility] Discharge Instructions for Patients Receiving Chemotherapy  Today you received the following chemotherapy agents kadcyla.    If you develop nausea and vomiting that is not controlled by your nausea medication, call the clinic.   BELOW ARE SYMPTOMS THAT SHOULD BE REPORTED IMMEDIATELY:  *FEVER GREATER THAN 100.5 F  *CHILLS WITH OR WITHOUT FEVER  NAUSEA AND VOMITING THAT IS NOT CONTROLLED WITH YOUR NAUSEA MEDICATION  *UNUSUAL SHORTNESS OF BREATH  *UNUSUAL BRUISING OR BLEEDING  TENDERNESS IN MOUTH AND THROAT WITH OR WITHOUT PRESENCE OF ULCERS  *URINARY PROBLEMS  *BOWEL PROBLEMS  UNUSUAL RASH Items with * indicate a potential emergency and should be followed up as soon as possible.  Feel free to call the clinic should you have any questions or concerns. The clinic phone number is [phone].  Please show the CHEMO ALERT CARD at check-in to the Emergency Department and triage nurse.   

## 2017-07-24 NOTE — Progress Notes (Signed)
ECHO 06/17/2017.   Patient tolerated chemotherapy with no complaints voiced.  Port site clean and dry with no bruising or swelling noted at site.  Good blood return noted before and after administration of chemo.  Band aid applied.  Patient taking calcium as directed for Xgeva. Denied tooth, jaw, or leg pain.  No recent or upcoming dental visits.  VSS with discharge and left by wheelchair with friend with no s/s of distress noted.

## 2017-07-24 NOTE — Progress Notes (Signed)
Diagnosis [name], stage 4, left ([place]) - Plan: CBC with Differential/Platelet, Comprehensive metabolic panel, Lactate dehydrogenase, CBC with Differential/Platelet, Comprehensive metabolic panel, Lactate dehydrogenase  Staging Cancer Staging [name], stage 4, left (HCC) Staging form: Breast, AJCC 8th Edition - Pathologic stage from 12/26/2015: Stage IIB (pT3, pN2a, cM0, G2, ER: Positive, PR: Positive, HER2: Negative) - Signed by Baird Cancer, PA-C on 02/15/2016 - Pathologic stage from 02/15/2016: Stage IV (pT3, pN2a, pM1, ER: Negative, PR: Negative, HER2: Positive) - Signed by Baird Cancer, PA-C on 02/28/2016   Assessment and Plan:  1.  [name], stage 4, left (HCC)62 y.o. 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  Receiving therapy with ado-trastuzumab (Kadcyla).  PET scan done 03/18/2017 shows no evidence of metastatic disease.  Previously, I discussed with her based on her history of stage IV breast cancer she will be recommended for ongoing therapy with intermittent imaging to document ongoing response to therapy.    Pt had ECHO done 06/17/2017 that showed EF 65-70%.  Labs reviewed and are adequate for therapy.  She will proceed with Kadcyla and will be set up for PET scan in 09/2017.  She will be set up for repeat ECHO in 09/2017.  She will continue therapy every 3 weeks and will be seen for follow-up in 6 weeks.     2.   Bone metastases.  She will continue Xgeva every 6 weeks.    3.  Hypertension.  Blood pressure is 111/60.  Follow-up with PCP.   4.  Emphysema.  Pt remains on O2 therapy at  4 L.  She has a prior history of smoking but no longer smokes. Will refer to pulmonary if any change in symptoms.    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   She has normal renal function and this is likely an incidental finding on scan.   Interval  history: Historical data from note dated 06/12/2017:  62 y.o. 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  Receiving therapy with ado-trastuzumab (Kadcyla) and Xgeva.    She has a history of:  [name], stage 4, left (HCC) Stage IV (pT3pN2ApM1) invasive ductal carcinoma, biopsy proven to liver, HER2 POSITIVE, ER/PR NEGATIVE.  Complicated by a LEFT invasive lobular carcinoma, Stage IIB (pT3pN2A), S/P left mastectomy by Dr. Arnoldo Morale on 12/16/2015, ER/PR+ 2%. HER2 NEGATIVE. Done prior to knowing about Stage IV disease. AND History with right invasive ductalbreast cancer, Stage IIIC, ER/PR+ and HER 2 POSITIVEin 2012 managed with right mastectomy, systemic chemotherapy, HER2 targeted therapy, XRT, and anti-endocrine therapy   She started palliative XRT to T8 pathologic fracture on 02/27/2016.  Chemo with docetaxel/herceptin/perjeta for 6 cycles: 03/06/16-06/26/16.  Current status: Patient is seen today for follow-up prior to Kadcyla.  She is here to go over ECHO.      Stage III (T4N3) R Br cancer  (Resolved)   06/21/2010 Initial Diagnosis    Stage III (T4N3) R Br cancer       01/04/2011 - 02/22/2011 Radiation Therapy    50 Gy       05/07/2014 Pathology Results    BCI testing- low risk of late recurrence (3.4% between years 5-10), high likelihood of benefit from extended endocrein therapy, and a 67% relative risk reduction when treated with extended endocrine therapy (27% versus 10.5%).       Invasive  ductal carcinoma of breast, stage 4, left ([place])   11/22/2015 Mammogram    Ultrasound-guided biopsy of the irregular hypoechoic area, with prominent shadowing, in the OUTER left breast at the 1 o'clock axis. 2. Ultrasound-guided biopsy of the irregular mass within the INNER left breast at the 9 o'clock axis. 3. Ultrasound-guided biopsy of 1 of the enlarged and amorphous lymph nodes in the left axilla.      11/22/2015  Imaging    Ultrasound-guided biopsy of the irregular hypoechoic area, with prominent shadowing, in the OUTER left breast at the 1 o'clock axis. 2. Ultrasound-guided biopsy of the irregular mass within the INNER left breast at the 9 o'clock axis. 3. Ultrasound-guided biopsy of 1 of the enlarged and amorphous lymph nodes in the left axilla.      12/16/2015 Surgery    L modified radical mastectomy with Dr. Arnoldo Morale      12/18/2015 - 12/21/2015 Hospital Admission    Admit date: 12/18/2015 Admission diagnosis: altered MS, acute respiratory failure Additional comments: This likely was a combination of COPD exacerbation along with mild acute on chronic diastolic CHF with  echo shows a preserved EF of around 60%, he was treated with IV steroids and Lasix, she is much better but qualifies for home oxygen which will be provided      12/19/2015 Imaging    No evidence of pulmonary embolus. 2. Small right pleural effusion, with associated atelectasis. Peripheral scarring at the anterior right upper lobe. Mild bilateral emphysema noted. 3. Scattered coronary artery calcifications seen. 4. Postoperative change at the left chest wall, reflecting recent mastectomy, with vague collections of postoperative fluid seen. 5. Vague sclerotic change and heterogeneity at vertebral body T8 raises concern for sequelae of metastatic disease      01/23/2016 PET scan    1. Widespread multifocal metastatic disease in the visualized axial and appendicular skeleton. 2. Solitary metastatic lesion in the liver. Hypermetabolic irregular left axillary lymph node adjacent to the left axillary clips. 3. Activity in the left breast related to mastectomy. 4. Low-grade activity along the pleuroparenchymal thickening anteriorly in the right chest is probably related to prior therapy rather than current malignancy. 5. Probable mild compression fracture at T8 associated with the extensive tumor at this level. There is also  bony destruction of the right ninth rib posteriorly. 6. Faintly hypermetabolic but small left external iliac lymph nodes merit surveillance  ADDENDUM: The original report was by Dr. Van Clines. The following addendum is by Dr. Van Clines:  I have one other musculoskeletal focus of hypermetabolic activity to mention. In the right latissimus dorsi muscle, there is a small hypermetabolic focus with maximum SUV of 5.4, measuring 7 mm in diameter on image 89/4, compatible with a metastatic deposit.        02/02/2016 Imaging    MRI T-spine: 1. Extensive metastatic disease throughout the cervical and thoracic spine with a subtle pathologic compression fracture of T8. Tumor extends through the posterior margin of the T8 vertebra into the spinal canal without spinal cord compression at this time. 2. No other pathologic fractures of the thoracic spine at this time.      02/10/2016 Procedure    US biopsy of liver lesion      02/15/2016 Pathology Results    Liver, needle/core biopsy, right lobe METASTATIC CARCINOMA, CONSISTENT WITH BREAST DUCTAL CARCINOMA.      02/15/2016 - 02/28/2016 Chemotherapy    Letrozole 2.5 mg daily + Kisqali (600 mg) days 1-21 every 28 days  02/21/2016 Pathology Results    Breast Prognostic profile from liver biopsy: HER2 POSITIVE, ER/PR NEGATIVE (0%).      02/27/2016 - 03/09/2016 Radiation Therapy    Palliative XRT to T7-T11 with Dr. Lianne Cure; 30 Gy in 10 fractions.       02/28/2016 Treatment Plan Change    Due to HER2 positivity and ER/PR negativity from liver biopsy, change to systemic chemotherapy is recommended.      03/06/2016 -  Chemotherapy    Docetaxel/Herceptin/Perjeta with Neulasta support.      03/11/2016 - 03/15/2016 Hospital Admission    Admit date: 03/11/2016 Admission diagnosis: Febrile neutropenia Additional comments: COPD exacerbation      03/27/2016 Treatment Plan Change    Docetaxel dose reduced by 20%.      03/27/2016  Adverse Reaction    Demonstrating early signs of palmar-plantar erythrodysesthesia.      03/27/2016 Treatment Plan Change    Defer treatment 1 week.      04/10/2016 Imaging    MUGA- The left ventricular ejection fraction is equal to 56.2%. Normal left ventricular wall motion.      06/04/2016 PET scan    1. No residual hypermetabolism within previously hypermetabolic lesions in the left axilla, right latissimus dorsi muscle, anterior right pleural space, liver, left external iliac lymph node and bones. 2. Aortic atherosclerosis (ICD10-170.0). Coronary artery calcification.      07/25/2016 Treatment Plan Change    Started Nerlynx      09/03/2016 PET scan     Interval development of small to moderate right pleural effusion associated with a linear band of hypermetabolism in the posterior right costophrenic sulcus ( SUV max = 5.7) . Imaging features are concerning for recurrent posterior right pleural disease.      09/06/2016 Treatment Plan Change    Stopped Nerlyn due to evidence of recurrent posterior right pleural disease.       09/11/2016 Treatment Plan Change    Started Kadcyla      11/30/2016 PET scan    IMPRESSION: 1. Reduction in volume of RIGHT pleural effusion and resolution of metabolic activity RIGHT at lung base most consistent with resolving inflammatory or infectious process. 2. No evidence disease progression. 3. Multiple sclerotic skeletal metastasis throughout the skeleton. Several foci of mild metabolic activity interspersed. No significant change.         Problem List Patient Active Problem List   Diagnosis Date Noted  . Febrile neutropenia ([place]) [D70.9, R50.81] 03/11/2016  . COPD (chronic obstructive pulmonary disease) ([place]) [J44.9] 03/11/2016  . Goals of care, counseling/discussion [Z71.89] 02/29/2016  . Bone metastases ([place]) [C79.51] 02/15/2016  . Elevated troponin [R74.8]   . Altered mental status [R41.82] 12/18/2015  . Invasive ductal  carcinoma of breast, stage 4, left ([place]) [C50.912] 12/16/2015  . Tobacco abuse [Z72.0] 06/22/2015  . Vitamin D deficiency [E55.9] 04/16/2014  . GAD (generalized anxiety disorder) [F41.1] 04/16/2014  . GERD (gastroesophageal reflux disease) [K21.9] 04/16/2014  . Osteopenia [M85.80] 11/18/2013  . Lymphedema of arm [I89.0] 10/14/2012  . Shortness of breath [R06.02] 03/11/2011  . Hypertension [I10] 09/06/2010  . Hyperlipidemia [E78.5] 09/06/2010    Past Medical History Past Medical History:  Diagnosis Date  . Anxiety   . Bone metastases ([place]) 02/15/2016  . Breast CA ([place]) 07/01/2010   rt breast ca  . COPD (chronic obstructive pulmonary disease) ([place])   . Family history of breast cancer    Aunt on father's side  . Hyperlipemia   . Hypertension   . MRSA (methicillin  resistant staph aureus) culture positive    in breast  . Neuropathy    bilateral toes  . Nipple discharge    with pain, infection, lump  . S/P radiation therapy 01/08/11 - 02/22/11   Right Chest Wall/ 50 Gy / 25 Fractions, Right [place]/ 50 gy/25 Fractions, Right Axillary Boost/500 cGy/25 Fractions, Right Chest Wall Boost/10 Gy/5 Fractions  . Stage III (T4N3) R Br cancer  06/21/2010   Letrozole 2.5 mg which she started on 03/09/2011.  This patient presented to my office on Jun 21, 2010 with a large ulcerated right breast mass and bleeding. She underwent additional workup at that time and a biopsy was done showing invasive breast cancer. She was staged and was found to have a T4  disease that was ER, PR, HER-2 positive. CA 2729 was normal at 31. She is undergoing chemotherapy wi  . Status post chemotherapy    6 cycles of carboplatin and docetaxel with trastuzumab every 21 days followed by surgery  . Tobacco abuse 06/22/2015  . Use of letrozole ([place]) 03/09/2011  . Wears dentures     Past Surgical History Past Surgical History:  Procedure Laterality Date  . BREAST SURGERY  2012  . DILATION AND CURETTAGE OF UTERUS    .  MASTECTOMY MODIFIED RADICAL Left 12/16/2015   Procedure: MASTECTOMY MODIFIED RADICAL;  Surgeon: Aviva Signs, MD;  Location: [facility];  Service: General;  Laterality: Left;  . port a catheter insertion    . PORT-A-CATH REMOVAL Left 01/15/2013   Procedure: REMOVAL PORT-A-CATH;  Surgeon: Scherry Ran, MD;  Location: [facility];  Service: General;  Laterality: Left;  . PORTACATH PLACEMENT Right 01/16/2016   Procedure: INSERTION PORT-A-CATH;  Surgeon: Aviva Signs, MD;  Location: [facility];  Service: General;  Laterality: Right;  . TUBAL LIGATION      Family History Family History  Problem Relation Age of [place]  . COPD Mother   . Diabetes Father   . Hypertension Father   . COPD Brother   . Cancer Maternal Aunt        ovarian - died of old age  . Cancer Paternal Aunt        had breast ca; died of of a different cancer  . Lung cancer Brother      Social History  reports that she quit smoking about 19 months ago. Her smoking use included cigarettes. She has a 7.50 pack-year smoking history. She has never used smokeless tobacco. She reports that she does not drink alcohol or use drugs.  Medications  Current Outpatient Medications:  .  Ado-Trastuzumab Emtansine (KADCYLA IV), Inject into the vein. Every 3 weeks, Disp: , Rfl:  .  albuterol (PROVENTIL HFA;VENTOLIN HFA) 108 (90 Base) MCG/ACT inhaler, Inhale 2 puffs into the lungs every 6 (six) hours as needed for wheezing or shortness of breath., Disp: , Rfl:  .  albuterol (PROVENTIL) (2.5 MG/3ML) 0.083% nebulizer solution, Inhale 3 mLs into the lungs every 6 (six) hours as needed for wheezing or shortness of breath., Disp: 75 mL, Rfl: 12 .  CARAFATE 1 GM/10ML suspension, TAKE 10 MLS BY MOUTH 4 TIMES DAILY WITH MEALS AND AT BEDTIME, Disp: 420 mL, Rfl: 2 .  cholecalciferol (VITAMIN D) 1000 units tablet, Take 2,000 Units by mouth daily., Disp: , Rfl:  .  fexofenadine (ALLEGRA) 180 MG tablet, Take 180 mg by mouth daily.  , Disp: , Rfl:  .   furosemide (LASIX) 40 MG tablet, TAKE 1 TABLET BY MOUTH ONCE DAILY,  Disp: 90 tablet, Rfl: 0 .  Ginkgo Biloba 120 MG TABS, Take 120 mg by mouth daily., Disp: , Rfl:  .  lidocaine-prilocaine (EMLA) cream, Apply a quarter size amount to affected area 1 hour prior to coming to chemotherapy., Disp: 30 g, Rfl: 2 .  loperamide (IMODIUM A-D) 2 MG tablet, Days 1-14 take 4 mg 3 times a day  Days 15-56 take 4 mg 2 times a day  Days 57-365 take 4 mg as needed (max 16 mg/day), Disp: 120 tablet, Rfl: 3 .  LORazepam (ATIVAN) 0.5 MG tablet, Take 1 tablet (0.5 mg total) by mouth every 8 (eight) hours., Disp: 90 tablet, Rfl: 2 .  losartan (COZAAR) 100 MG tablet, Take 0.5 tablets (50 mg total) by mouth daily., Disp: 90 tablet, Rfl: 1 .  magic mouthwash w/lidocaine SOLN, Take 5 mLs by mouth 4 (four) times daily as needed for mouth pain., Disp: 240 mL, Rfl: 1 .  Multiple Vitamins-Minerals (EMERGEN-C IMMUNE PO), Take 1 tablet by mouth daily., Disp: , Rfl:  .  naproxen sodium (ANAPROX) 220 MG tablet, Take 220 mg by mouth 2 (two) times daily as needed (pain). pain, Disp: , Rfl:  .  omeprazole (PRILOSEC OTC) 20 MG tablet, Take 40 mg by mouth daily., Disp: , Rfl:  .  potassium chloride SA (KLOR-CON M20) 20 MEQ tablet, Take 1 tablet (20 mEq total) by mouth 3 (three) times daily., Disp: 180 tablet, Rfl: 0 .  prochlorperazine (COMPAZINE) 10 MG tablet, Take 1 tablet (10 mg total) by mouth every 6 (six) hours as needed for nausea or vomiting., Disp: 30 tablet, Rfl: 2 .  simethicone (MYLICON) 80 MG chewable tablet, Chew 2 tablets (160 mg total) by mouth 4 (four) times daily as needed for flatulence., Disp: 30 tablet, Rfl: 0 .  SYMBICORT 160-4.5 MCG/ACT inhaler, INHALE TWO PUFFS BY MOUTH TWICE DAILY, Disp: 6 g, Rfl: 5 No current facility-administered medications for this visit.   Facility-Administered Medications Ordered in Other Visits:  .  acetaminophen (TYLENOL) tablet 650 mg, 650 mg, Oral, Once, Derek Jack, MD .   diphenhydrAMINE (BENADRYL) capsule 50 mg, 50 mg, Oral, Once, Derek Jack, MD .  heparin lock flush 100 unit/mL, 500 Units, Intravenous, Once, Twana First, MD .  sodium chloride flush (NS) 0.9 % injection 10 mL, 10 mL, Intravenous, PRN, Twana First, MD .  sodium chloride flush (NS) 0.9 % injection 10 mL, 10 mL, Intracatheter, PRN, Derek Jack, MD, 10 mL at 07/24/17 1045  Allergies Patient has no known allergies.  Review of Systems Review of Systems - Oncology ROS as per HPI otherwise 12 point ROS is negative.   Physical Exam  Vitals Wt Readings from Last 3 Encounters:  07/24/17 151 lb 14.4 oz (68.9 kg)  07/03/17 154 lb 3.2 oz (69.9 kg)  06/12/17 152 lb (68.9 kg)   Temp Readings from Last 3 Encounters:  07/24/17 98 F (36.7 C) (Oral)  07/03/17 98 F (36.7 C) (Oral)  06/12/17 98.1 F (36.7 C) (Oral)   BP Readings from Last 3 Encounters:  07/24/17 (!) 105/48  07/03/17 (!) 114/49  06/12/17 127/81   Pulse Readings from Last 3 Encounters:  07/24/17 79  07/03/17 81  06/12/17 76     Constitutional: Well-developed, well-nourished, and in no distress.   HENT: Head: Normocephalic and atraumatic.  Mouth/Throat: No oropharyngeal exudate. Mucosa moist. Eyes: Pupils are equal, round, and reactive to light. Conjunctivae are normal. No scleral icterus.  Neck: Normal range of motion. Neck supple. No JVD present.  Cardiovascular: Normal rate, regular rhythm and normal heart sounds.  Exam reveals no gallop and no friction rub.   No murmur heard. Pulmonary/Chest: Effort normal and breath sounds normal. No respiratory distress. No wheezes.No rales.  Abdominal: Soft. Bowel sounds are normal. No distension. There is no tenderness. There is no guarding.  Musculoskeletal: No edema or tenderness.  Lymphadenopathy: No cervical, axillary or supraclavicular adenopathy.  Neurological: Alert and oriented to person, place, and time. No cranial nerve deficit.  Skin: Skin is warm  and dry. No rash noted. No erythema. No pallor.  Psychiatric: Affect and judgment normal.   Labs Infusion on 07/24/2017  Component Date Value Ref Range Status  . LDH 07/24/2017 167  98 - 192 U/L Final   Performed at [facility], [address], [place], [place] [address]  . Sodium 07/24/2017 138  135 - 145 mmol/L Final  . Potassium 07/24/2017 3.9  3.5 - 5.1 mmol/L Final  . Chloride 07/24/2017 95* 101 - 111 mmol/L Final  . CO2 07/24/2017 33* 22 - 32 mmol/L Final  . Glucose, Bld 07/24/2017 164* 65 - 99 mg/dL Final  . BUN 07/24/2017 17  6 - 20 mg/dL Final  . Creatinine, Ser 07/24/2017 0.61  0.44 - 1.00 mg/dL Final  . Calcium 07/24/2017 9.6  8.9 - 10.3 mg/dL Final  . Total Protein 07/24/2017 7.3  6.5 - 8.1 g/dL Final  . Albumin 07/24/2017 3.1* 3.5 - 5.0 g/dL Final  . AST 07/24/2017 52* 15 - 41 U/L Final  . ALT 07/24/2017 40  14 - 54 U/L Final  . Alkaline Phosphatase 07/24/2017 118  38 - 126 U/L Final  . Total Bilirubin 07/24/2017 0.4  0.3 - 1.2 mg/dL Final  . GFR calc non Af Amer 07/24/2017 >60  >60 mL/min Final  . GFR calc Af Amer 07/24/2017 >60  >60 mL/min Final   Comment: (NOTE) The eGFR has been calculated using the CKD EPI equation. This calculation has not been validated in all clinical situations. eGFR's persistently <60 mL/min signify possible Chronic Kidney Disease.   Georgiann Hahn gap 07/24/2017 10  5 - 15 Final   Performed at [facility], [address], [place], [place] [address]  . WBC 07/24/2017 7.5  4.0 - 10.5 K/uL Final  . RBC 07/24/2017 4.00  3.87 - 5.11 MIL/uL Final  . Hemoglobin 07/24/2017 10.7* 12.0 - 15.0 g/dL Final  . HCT 07/24/2017 36.0  36.0 - 46.0 % Final  . MCV 07/24/2017 90.0  78.0 - 100.0 fL Final  . MCH 07/24/2017 26.8  26.0 - 34.0 pg Final  . MCHC 07/24/2017 29.7* 30.0 - 36.0 g/dL Final  . RDW 07/24/2017 17.1* 11.5 - 15.5 % Final  . Platelets 07/24/2017 202  150 - 400 K/uL Final  . Neutrophils Relative % 07/24/2017 63  % Final  . Neutro Abs 07/24/2017 4.7   1.7 - 7.7 K/uL Final  . Lymphocytes Relative 07/24/2017 18  % Final  . Lymphs Abs 07/24/2017 1.4  0.7 - 4.0 K/uL Final  . Monocytes Relative 07/24/2017 16  % Final  . Monocytes Absolute 07/24/2017 1.2* 0.1 - 1.0 K/uL Final  . Eosinophils Relative 07/24/2017 3  % Final  . Eosinophils Absolute 07/24/2017 0.3  0.0 - 0.7 K/uL Final  . Basophils Relative 07/24/2017 0  % Final  . Basophils Absolute 07/24/2017 0.0  0.0 - 0.1 K/uL Final   Performed at [facility], [address] [place], [place] [address]     Pathology Orders Placed This Encounter  Procedures  . CBC with Differential/Platelet    Standing Status:   Future    Standing Expiration Date:   07/25/2018  . Comprehensive metabolic panel    Standing Status:   Future    Standing Expiration Date:   07/25/2018  . Lactate dehydrogenase    Standing Status:   Future    Standing Expiration Date:   07/25/2018  . CBC with Differential/Platelet    Standing Status:   Future    Standing Expiration Date:   07/25/2018  . Comprehensive metabolic panel    Standing Status:   Future    Standing Expiration Date:   07/25/2018  . Lactate dehydrogenase    Standing Status:   Future    Standing Expiration Date:   07/25/2018       Zoila Shutter MD

## 2017-08-14 ENCOUNTER — Inpatient Hospital Stay (HOSPITAL_COMMUNITY): Payer: Managed Care, Other (non HMO) | Attending: Hematology & Oncology

## 2017-08-14 VITALS — BP 93/63 | HR 78 | Temp 97.7°F | Resp 18 | Wt 145.3 lb

## 2017-08-14 DIAGNOSIS — J439 Emphysema, unspecified: Secondary | ICD-10-CM | POA: Insufficient documentation

## 2017-08-14 DIAGNOSIS — C50912 Malignant neoplasm of unspecified site of left female breast: Secondary | ICD-10-CM | POA: Insufficient documentation

## 2017-08-14 DIAGNOSIS — Z803 Family history of malignant neoplasm of breast: Secondary | ICD-10-CM | POA: Insufficient documentation

## 2017-08-14 DIAGNOSIS — Z9221 Personal history of antineoplastic chemotherapy: Secondary | ICD-10-CM | POA: Insufficient documentation

## 2017-08-14 DIAGNOSIS — Z923 Personal history of irradiation: Secondary | ICD-10-CM | POA: Insufficient documentation

## 2017-08-14 DIAGNOSIS — Z8041 Family history of malignant neoplasm of ovary: Secondary | ICD-10-CM | POA: Insufficient documentation

## 2017-08-14 DIAGNOSIS — Z171 Estrogen receptor negative status [ER-]: Secondary | ICD-10-CM | POA: Diagnosis not present

## 2017-08-14 DIAGNOSIS — E785 Hyperlipidemia, unspecified: Secondary | ICD-10-CM | POA: Insufficient documentation

## 2017-08-14 DIAGNOSIS — C787 Secondary malignant neoplasm of liver and intrahepatic bile duct: Secondary | ICD-10-CM | POA: Diagnosis not present

## 2017-08-14 DIAGNOSIS — Z5112 Encounter for antineoplastic immunotherapy: Secondary | ICD-10-CM | POA: Diagnosis not present

## 2017-08-14 DIAGNOSIS — I10 Essential (primary) hypertension: Secondary | ICD-10-CM | POA: Insufficient documentation

## 2017-08-14 DIAGNOSIS — I2510 Atherosclerotic heart disease of native coronary artery without angina pectoris: Secondary | ICD-10-CM | POA: Diagnosis not present

## 2017-08-14 DIAGNOSIS — I722 Aneurysm of renal artery: Secondary | ICD-10-CM | POA: Diagnosis not present

## 2017-08-14 DIAGNOSIS — Z79899 Other long term (current) drug therapy: Secondary | ICD-10-CM | POA: Insufficient documentation

## 2017-08-14 DIAGNOSIS — Z87891 Personal history of nicotine dependence: Secondary | ICD-10-CM | POA: Diagnosis not present

## 2017-08-14 DIAGNOSIS — C7951 Secondary malignant neoplasm of bone: Secondary | ICD-10-CM | POA: Diagnosis not present

## 2017-08-14 LAB — CBC WITH DIFFERENTIAL/PLATELET
BASOS ABS: 0.1 10*3/uL (ref 0.0–0.1)
BASOS PCT: 1 %
Eosinophils Absolute: 0.4 10*3/uL (ref 0.0–0.7)
Eosinophils Relative: 4 %
HCT: 37.3 % (ref 36.0–46.0)
HEMOGLOBIN: 11.4 g/dL — AB (ref 12.0–15.0)
Lymphocytes Relative: 21 %
Lymphs Abs: 2 10*3/uL (ref 0.7–4.0)
MCH: 26.8 pg (ref 26.0–34.0)
MCHC: 30.6 g/dL (ref 30.0–36.0)
MCV: 87.6 fL (ref 78.0–100.0)
Monocytes Absolute: 1.5 10*3/uL — ABNORMAL HIGH (ref 0.1–1.0)
Monocytes Relative: 16 %
NEUTROS ABS: 5.8 10*3/uL (ref 1.7–7.7)
NEUTROS PCT: 58 %
Platelets: 236 10*3/uL (ref 150–400)
RBC: 4.26 MIL/uL (ref 3.87–5.11)
RDW: 16.7 % — AB (ref 11.5–15.5)
WBC: 9.7 10*3/uL (ref 4.0–10.5)

## 2017-08-14 LAB — COMPREHENSIVE METABOLIC PANEL
ALT: 30 U/L (ref 0–44)
ANION GAP: 9 (ref 5–15)
AST: 48 U/L — ABNORMAL HIGH (ref 15–41)
Albumin: 3.3 g/dL — ABNORMAL LOW (ref 3.5–5.0)
Alkaline Phosphatase: 116 U/L (ref 38–126)
BILIRUBIN TOTAL: 0.4 mg/dL (ref 0.3–1.2)
BUN: 17 mg/dL (ref 8–23)
CALCIUM: 9.1 mg/dL (ref 8.9–10.3)
CO2: 34 mmol/L — ABNORMAL HIGH (ref 22–32)
Chloride: 94 mmol/L — ABNORMAL LOW (ref 98–111)
Creatinine, Ser: 0.6 mg/dL (ref 0.44–1.00)
GFR calc non Af Amer: >60 mL/min (ref >60)
Glucose, Bld: 97 mg/dL (ref 70–99)
Potassium: 3.7 mmol/L (ref 3.5–5.1)
Sodium: 137 mmol/L (ref 135–145)
TOTAL PROTEIN: 7.7 g/dL (ref 6.5–8.1)

## 2017-08-14 LAB — LACTATE DEHYDROGENASE: LDH: 176 U/L (ref 98–192)

## 2017-08-14 MED ORDER — ACETAMINOPHEN 325 MG PO TABS: 650.0000 mg | ORAL_TABLET | Freq: Once | ORAL | Status: DC

## 2017-08-14 MED ORDER — SODIUM CHLORIDE 0.9 % IV SOLN
3.9000 mg/kg | Freq: Once | INTRAVENOUS | Status: AC
  Administered 2017-08-14: 260 mg via INTRAVENOUS
  Filled 2017-08-14: qty 5

## 2017-08-14 MED ORDER — SODIUM CHLORIDE 0.9 % IV SOLN
Freq: Once | INTRAVENOUS | Status: AC
  Administered 2017-08-14: 14:00:00 via INTRAVENOUS

## 2017-08-14 MED ORDER — HEPARIN SOD (PORK) LOCK FLUSH 100 UNIT/ML IV SOLN
500.0000 [IU] | Freq: Once | INTRAVENOUS | Status: AC | PRN
  Administered 2017-08-14: 500 [IU]
  Filled 2017-08-14: qty 5

## 2017-08-14 MED ORDER — SODIUM CHLORIDE 0.9% FLUSH
10.0000 mL | INTRAVENOUS | Status: DC | PRN
  Administered 2017-08-14: 10 mL
  Filled 2017-08-14: qty 10

## 2017-08-14 MED ORDER — DIPHENHYDRAMINE HCL 25 MG PO CAPS: 50.0000 mg | ORAL_CAPSULE | Freq: Once | ORAL | Status: DC

## 2017-08-14 NOTE — Progress Notes (Signed)
Labs meet parameters for treatment today.  

## 2017-08-15 ENCOUNTER — Encounter (HOSPITAL_COMMUNITY): Payer: Self-pay

## 2017-08-15 ENCOUNTER — Other Ambulatory Visit: Payer: Self-pay

## 2017-08-15 NOTE — Patient Instructions (Signed)
[facility] Discharge Instructions for Patients Receiving Chemotherapy   Beginning January 23rd 2017 lab work for the Cancer Center will be done in the  Main lab at [place] on 1st floor. If you have a lab appointment with the Cancer Center please come in thru the  Main Entrance and check in at the main information desk   Today you received the following chemotherapy agents   To help prevent nausea and vomiting after your treatment, we encourage you to take your nausea medication     If you develop nausea and vomiting, or diarrhea that is not controlled by your medication, call the clinic.  The clinic phone number is [phone]. Office hours are Monday-Friday 8:30am-5:00pm.  BELOW ARE SYMPTOMS THAT SHOULD BE REPORTED IMMEDIATELY:  *FEVER GREATER THAN 101.0 F  *CHILLS WITH OR WITHOUT FEVER  NAUSEA AND VOMITING THAT IS NOT CONTROLLED WITH YOUR NAUSEA MEDICATION  *UNUSUAL SHORTNESS OF BREATH  *UNUSUAL BRUISING OR BLEEDING  TENDERNESS IN MOUTH AND THROAT WITH OR WITHOUT PRESENCE OF ULCERS  *URINARY PROBLEMS  *BOWEL PROBLEMS  UNUSUAL RASH Items with * indicate a potential emergency and should be followed up as soon as possible. If you have an emergency after office hours please contact your primary care physician or go to the nearest emergency department.  Please call the clinic during office hours if you have any questions or concerns.   You may also contact the Patient Navigator at [phone] should you have any questions or need assistance in obtaining follow up care.      Resources For Cancer Patients and their Caregivers ? American Cancer Society: Can assist with transportation, wigs, general needs, runs Look Good Feel Better.        1-888-227-6333 ? Cancer Care: Provides financial assistance, online support groups, medication/co-pay assistance.  1-800-813-HOPE (4673) ? [facility] Assists [place] cancer  patients and their families through emotional , educational and financial support.  336-427-4357 ? [place] DSS Where to apply for food stamps, Medicaid and utility assistance. 336-342-1394 ? [facility]: Transportation to medical appointments. 336-347-2287 ? Social Security Administration: May apply for disability if have a Stage IV cancer. 336-342-7796 1-800-772-1213 ? [place] Aging, Disability and Transit Services: Assists with nutrition, care and transit needs. 336-349-2343         

## 2017-09-04 ENCOUNTER — Inpatient Hospital Stay (HOSPITAL_BASED_OUTPATIENT_CLINIC_OR_DEPARTMENT_OTHER): Payer: Managed Care, Other (non HMO) | Admitting: Internal Medicine

## 2017-09-04 ENCOUNTER — Encounter (HOSPITAL_COMMUNITY): Payer: Self-pay | Admitting: Internal Medicine

## 2017-09-04 ENCOUNTER — Other Ambulatory Visit (HOSPITAL_COMMUNITY): Payer: Self-pay | Admitting: Internal Medicine

## 2017-09-04 ENCOUNTER — Other Ambulatory Visit: Payer: Self-pay

## 2017-09-04 ENCOUNTER — Inpatient Hospital Stay (HOSPITAL_COMMUNITY): Payer: Managed Care, Other (non HMO)

## 2017-09-04 VITALS — BP 119/66 | HR 85 | Temp 98.3°F | Resp 18 | Wt 142.4 lb

## 2017-09-04 DIAGNOSIS — Z803 Family history of malignant neoplasm of breast: Secondary | ICD-10-CM

## 2017-09-04 DIAGNOSIS — Z171 Estrogen receptor negative status [ER-]: Secondary | ICD-10-CM

## 2017-09-04 DIAGNOSIS — I2510 Atherosclerotic heart disease of native coronary artery without angina pectoris: Secondary | ICD-10-CM

## 2017-09-04 DIAGNOSIS — C787 Secondary malignant neoplasm of liver and intrahepatic bile duct: Secondary | ICD-10-CM

## 2017-09-04 DIAGNOSIS — C50912 Malignant neoplasm of unspecified site of left female breast: Secondary | ICD-10-CM

## 2017-09-04 DIAGNOSIS — C7951 Secondary malignant neoplasm of bone: Secondary | ICD-10-CM

## 2017-09-04 DIAGNOSIS — E785 Hyperlipidemia, unspecified: Secondary | ICD-10-CM

## 2017-09-04 DIAGNOSIS — Z87891 Personal history of nicotine dependence: Secondary | ICD-10-CM

## 2017-09-04 DIAGNOSIS — Z8041 Family history of malignant neoplasm of ovary: Secondary | ICD-10-CM

## 2017-09-04 DIAGNOSIS — Z79899 Other long term (current) drug therapy: Secondary | ICD-10-CM

## 2017-09-04 DIAGNOSIS — I722 Aneurysm of renal artery: Secondary | ICD-10-CM

## 2017-09-04 DIAGNOSIS — Z9221 Personal history of antineoplastic chemotherapy: Secondary | ICD-10-CM

## 2017-09-04 DIAGNOSIS — I10 Essential (primary) hypertension: Secondary | ICD-10-CM

## 2017-09-04 DIAGNOSIS — Z5112 Encounter for antineoplastic immunotherapy: Secondary | ICD-10-CM

## 2017-09-04 DIAGNOSIS — J439 Emphysema, unspecified: Secondary | ICD-10-CM

## 2017-09-04 DIAGNOSIS — Z923 Personal history of irradiation: Secondary | ICD-10-CM

## 2017-09-04 LAB — CBC WITH DIFFERENTIAL/PLATELET
BASOS PCT: 0 %
Basophils Absolute: 0 10*3/uL (ref 0.0–0.1)
EOS ABS: 0.2 10*3/uL (ref 0.0–0.7)
Eosinophils Relative: 2 %
HCT: 37.8 % (ref 36.0–46.0)
Hemoglobin: 11.4 g/dL — ABNORMAL LOW (ref 12.0–15.0)
Lymphocytes Relative: 19 %
Lymphs Abs: 1.6 10*3/uL (ref 0.7–4.0)
MCH: 26.8 pg (ref 26.0–34.0)
MCHC: 30.2 g/dL (ref 30.0–36.0)
MCV: 88.7 fL (ref 78.0–100.0)
MONO ABS: 1.3 10*3/uL — AB (ref 0.1–1.0)
MONOS PCT: 15 %
Neutro Abs: 5.5 10*3/uL (ref 1.7–7.7)
Neutrophils Relative %: 64 %
Platelets: 202 10*3/uL (ref 150–400)
RBC: 4.26 MIL/uL (ref 3.87–5.11)
RDW: 17.7 % — ABNORMAL HIGH (ref 11.5–15.5)
WBC: 8.6 10*3/uL (ref 4.0–10.5)

## 2017-09-04 LAB — COMPREHENSIVE METABOLIC PANEL
ALBUMIN: 3.4 g/dL — AB (ref 3.5–5.0)
ALK PHOS: 118 U/L (ref 38–126)
ALT: 31 U/L (ref 0–44)
AST: 60 U/L — ABNORMAL HIGH (ref 15–41)
Anion gap: 8 (ref 5–15)
BILIRUBIN TOTAL: 0.4 mg/dL (ref 0.3–1.2)
BUN: 11 mg/dL (ref 8–23)
CALCIUM: 9.4 mg/dL (ref 8.9–10.3)
CO2: 34 mmol/L — AB (ref 22–32)
CREATININE: 0.51 mg/dL (ref 0.44–1.00)
Chloride: 96 mmol/L — ABNORMAL LOW (ref 98–111)
GFR calc Af Amer: >60 mL/min (ref >60)
GFR calc non Af Amer: >60 mL/min (ref >60)
GLUCOSE: 115 mg/dL — AB (ref 70–99)
Potassium: 3.8 mmol/L (ref 3.5–5.1)
Sodium: 138 mmol/L (ref 135–145)
TOTAL PROTEIN: 7.6 g/dL (ref 6.5–8.1)

## 2017-09-04 LAB — LACTATE DEHYDROGENASE: LDH: 155 U/L (ref 98–192)

## 2017-09-04 MED ORDER — ACETAMINOPHEN 325 MG PO TABS: 650.0000 mg | ORAL_TABLET | Freq: Once | ORAL | Status: DC

## 2017-09-04 MED ORDER — DIPHENHYDRAMINE HCL 25 MG PO CAPS: 50.0000 mg | ORAL_CAPSULE | Freq: Once | ORAL | Status: DC

## 2017-09-04 MED ORDER — SODIUM CHLORIDE 0.9 % IV SOLN
3.9000 mg/kg | Freq: Once | INTRAVENOUS | Status: AC
  Administered 2017-09-04: 260 mg via INTRAVENOUS
  Filled 2017-09-04: qty 8

## 2017-09-04 MED ORDER — DENOSUMAB 120 MG/1.7ML ~~LOC~~ SOLN
120.0000 mg | Freq: Once | SUBCUTANEOUS | Status: AC
  Administered 2017-09-04: 120 mg via SUBCUTANEOUS
  Filled 2017-09-04: qty 1.7

## 2017-09-04 MED ORDER — HEPARIN SOD (PORK) LOCK FLUSH 100 UNIT/ML IV SOLN
500.0000 [IU] | Freq: Once | INTRAVENOUS | Status: AC | PRN
  Administered 2017-09-04: 500 [IU]

## 2017-09-04 MED ORDER — SODIUM CHLORIDE 0.9% FLUSH
10.0000 mL | INTRAVENOUS | Status: DC | PRN
  Administered 2017-09-04: 10 mL
  Filled 2017-09-04: qty 10

## 2017-09-04 MED ORDER — SODIUM CHLORIDE 0.9 % IV SOLN
Freq: Once | INTRAVENOUS | Status: AC
  Administered 2017-09-04: 10:00:00 via INTRAVENOUS

## 2017-09-04 NOTE — Progress Notes (Signed)
[id number] Labs reviewed with and pt seen by Dr. Walden Field today and pt approved for Kadcyla infusion and Xgeva injection per MD                                                   Olen Pel tolerated Kadcyla infusion and Xgeva injection well without complaints or incident. Calcium 9.4 today and pt denied any tooth or jaw pain and no recent or future dental visits prior to receiving the Xgeva. VSS upon discharge. Pt discharged via wheelchair in satisfactory condition accompanied by a friend

## 2017-09-04 NOTE — Patient Instructions (Signed)
[facility] Discharge Instructions for Patients Receiving Chemotherapy   Beginning January 23rd 2017 lab work for the Cancer Center will be done in the  Main lab at [facility] on 1st floor. If you have a lab appointment with the Cancer Center please come in thru the  Main Entrance and check in at the main information desk   Today you received the following chemotherapy agents Kadcyla as well as Xgeva injection. Follow-up as scheduled. Call clinic for any questions or concerns  To help prevent nausea and vomiting after your treatment, we encourage you to take your nausea medication   If you develop nausea and vomiting, or diarrhea that is not controlled by your medication, call the clinic.  The clinic phone number is [phone]. Office hours are Monday-Friday 8:30am-5:00pm.  BELOW ARE SYMPTOMS THAT SHOULD BE REPORTED IMMEDIATELY:  *FEVER GREATER THAN 101.0 F  *CHILLS WITH OR WITHOUT FEVER  NAUSEA AND VOMITING THAT IS NOT CONTROLLED WITH YOUR NAUSEA MEDICATION  *UNUSUAL SHORTNESS OF BREATH  *UNUSUAL BRUISING OR BLEEDING  TENDERNESS IN MOUTH AND THROAT WITH OR WITHOUT PRESENCE OF ULCERS  *URINARY PROBLEMS  *BOWEL PROBLEMS  UNUSUAL RASH Items with * indicate a potential emergency and should be followed up as soon as possible. If you have an emergency after office hours please contact your primary care physician or go to the nearest emergency department.  Please call the clinic during office hours if you have any questions or concerns.   You may also contact the Patient Navigator at [phone] should you have any questions or need assistance in obtaining follow up care.      Resources For Cancer Patients and their Caregivers ? [employer]: Can assist with transportation, wigs, general needs, runs Look Good Feel Better.        1-888-227-6333 ? Cancer Care: Provides financial assistance, online support groups, medication/co-pay  assistance.  1-800-813-HOPE (4673) ? [facility] Assists [place] cancer patients and their families through emotional , educational and financial support.  336-427-4357 ? [place] DSS Where to apply for food stamps, Medicaid and utility assistance. 336-342-1394 ? [facility]: Transportation to medical appointments. 336-347-2287 ? Social Security Administration: May apply for disability if have a Stage IV cancer. 336-342-7796 1-800-772-1213 ? [place] [facility]: Assists with nutrition, care and transit needs. 336-349-2343         

## 2017-09-04 NOTE — Patient Instructions (Signed)
[facility] Discharge Instructions  Today you saw Dr. Higgs   Thank you for choosing  Cancer Center at [facility] to provide your oncology and hematology care.  To afford each patient quality time with our provider, please arrive at least 15 minutes before your scheduled appointment time.   If you have a lab appointment with the Cancer Center please come in thru the  Main Entrance and check in at the main information desk  You need to re-schedule your appointment should you arrive 10 or more minutes late.  We strive to give you quality time with our providers, and arriving late affects you and other patients whose appointments are after yours.  Also, if you no show three or more times for appointments you may be dismissed from the clinic at the providers discretion.     Again, thank you for choosing [facility].  Our hope is that these requests will decrease the amount of time that you wait before being seen by our physicians.       _____________________________________________________________  Should you have questions after your visit to  Cancer Center, please contact our office at [phone] between the hours of 8:00 a.m. and 4:30 p.m.  Voicemails left after 4:00 p.m. will not be returned until the following business day.  For prescription refill requests, have your pharmacy contact our office and allow 72 hours.    Cancer Center Support Programs:   > Cancer Support Group  2nd Tuesday of the month 1pm-2pm, Journey Room   

## 2017-09-04 NOTE — Progress Notes (Signed)
Diagnosis [name], stage 4, left (HCC) - Plan: CBC with Differential/Platelet, Comprehensive metabolic panel, Lactate dehydrogenase, DISCONTINUED: 0.9 %  sodium chloride infusion, DISCONTINUED: sodium chloride flush (NS) 0.9 % injection 10 mL, DISCONTINUED: heparin lock flush 100 unit/mL, DISCONTINUED: acetaminophen (TYLENOL) tablet 650 mg, DISCONTINUED: diphenhydrAMINE (BENADRYL) capsule 50 mg, DISCONTINUED: ado-trastuzumab emtansine (KADCYLA) 260 mg in sodium chloride 0.9 % 250 mL chemo infusion  Staging Cancer Staging [name], stage 4, left (HCC) Staging form: Breast, AJCC 8th Edition - Pathologic stage from 12/26/2015: Stage IIB (pT3, pN2a, cM0, G2, ER: Positive, PR: Positive, HER2: Negative) - Signed by Baird Cancer, PA-C on 02/15/2016 - Pathologic stage from 02/15/2016: Stage IV (pT3, pN2a, pM1, ER: Negative, PR: Negative, HER2: Positive) - Signed by Baird Cancer, PA-C on 02/28/2016   Assessment and Plan:   1.  [name], stage 4, left (HCC)62 y.o. 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  Receiving therapy with ado-trastuzumab (Kadcyla).  PET scan done 03/18/2017 shows no evidence of metastatic disease.  Previously, I discussed with her based on her history of stage IV breast cancer she will be recommended for ongoing therapy with intermittent imaging to document ongoing response to therapy.    Pt had ECHO done 06/17/2017 that showed EF 65-70%.  Labs reviewed today and are adequate for therapy.  She will proceed with Kadcyla.  Pt is set up for PET scan in 09/2017.  She is also set up for ECHO in 09/2017.  She will continue therapy every 3 weeks.  Pt will be seen in 09/2017 for follow-up to go over scan and ECHO results.   2.   Bone metastases.  She will continue Xgeva every 6 weeks.    3.  Hypertension.  Blood pressure is 107/63.   Follow-up with PCP.   4.   Emphysema.  Pt remains on O2 therapy at  4 L.  She has a prior history of smoking but no longer smokes. Follow-up with pulmonary if any change in symptoms.    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   She has normal renal function and this is likely an incidental finding on scan.    Interval history: Historical data from note dated 06/12/2017:  62 y.o. 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  Receiving therapy with ado-trastuzumab (Kadcyla) and Xgeva.    She has a history of:  [name], stage 4, left (HCC) Stage IV (pT3pN2ApM1) invasive ductal carcinoma, biopsy proven to liver, HER2 POSITIVE, ER/PR NEGATIVE.  Complicated by a LEFT invasive lobular carcinoma, Stage IIB (pT3pN2A), S/P left mastectomy by Dr. Arnoldo Morale on 12/16/2015, ER/PR+ 2%. HER2 NEGATIVE. Done prior to knowing about Stage IV disease. AND History with right invasive ductalbreast cancer, Stage IIIC, ER/PR+ and HER 2 POSITIVEin 2012 managed with right mastectomy, systemic chemotherapy, HER2 targeted therapy, XRT, and anti-endocrine therapy   She started palliative XRT to T8 pathologic fracture on 02/27/2016.  Chemo with docetaxel/herceptin/perjeta for 6 cycles: 03/06/16-06/26/16.  Current status: Patient is seen today for follow-up prior to Kadcyla.  She remains on 4 L of oxygen.      Stage III (T4N3) R Br cancer  (Resolved)   06/21/2010 Initial Diagnosis    Stage III (T4N3) R Br cancer       01/04/2011 - 02/22/2011 Radiation Therapy    50 Gy  05/07/2014 Pathology Results    BCI testing- low risk of late recurrence (3.4% between years 5-10), high likelihood of benefit from extended endocrein therapy, and a 67% relative risk reduction when treated with extended endocrine therapy (27% versus 10.5%).       [name], stage 4, left ([place])   11/22/2015 Mammogram    Ultrasound-guided biopsy of the  irregular hypoechoic area, with prominent shadowing, in the OUTER left breast at the 1 o'clock axis. 2. Ultrasound-guided biopsy of the irregular mass within the INNER left breast at the 9 o'clock axis. 3. Ultrasound-guided biopsy of 1 of the enlarged and amorphous lymph nodes in the left axilla.      11/22/2015 Imaging    Ultrasound-guided biopsy of the irregular hypoechoic area, with prominent shadowing, in the OUTER left breast at the 1 o'clock axis. 2. Ultrasound-guided biopsy of the irregular mass within the INNER left breast at the 9 o'clock axis. 3. Ultrasound-guided biopsy of 1 of the enlarged and amorphous lymph nodes in the left axilla.      12/16/2015 Surgery    L modified radical mastectomy with Dr. Arnoldo Morale      12/18/2015 - 12/21/2015 Hospital Admission    Admit date: 12/18/2015 Admission diagnosis: altered MS, acute respiratory failure Additional comments: This likely was a combination of COPD exacerbation along with mild acute on chronic diastolic CHF with  echo shows a preserved EF of around 60%, he was treated with IV steroids and Lasix, she is much better but qualifies for home oxygen which will be provided      12/19/2015 Imaging    No evidence of pulmonary embolus. 2. Small right pleural effusion, with associated atelectasis. Peripheral scarring at the anterior right upper lobe. Mild bilateral emphysema noted. 3. Scattered coronary artery calcifications seen. 4. Postoperative change at the left chest wall, reflecting recent mastectomy, with vague collections of postoperative fluid seen. 5. Vague sclerotic change and heterogeneity at vertebral body T8 raises concern for sequelae of metastatic disease      01/23/2016 PET scan    1. Widespread multifocal metastatic disease in the visualized axial and appendicular skeleton. 2. Solitary metastatic lesion in the liver. Hypermetabolic irregular left axillary lymph node adjacent to the left axillary clips. 3.  Activity in the left breast related to mastectomy. 4. Low-grade activity along the pleuroparenchymal thickening anteriorly in the right chest is probably related to prior therapy rather than current malignancy. 5. Probable mild compression fracture at T8 associated with the extensive tumor at this level. There is also bony destruction of the right ninth rib posteriorly. 6. Faintly hypermetabolic but small left external iliac lymph nodes merit surveillance  ADDENDUM: The original report was by Dr. Van Clines. The following addendum is by Dr. Van Clines:  I have one other musculoskeletal focus of hypermetabolic activity to mention. In the right latissimus dorsi muscle, there is a small hypermetabolic focus with maximum SUV of 5.4, measuring 7 mm in diameter on image 89/4, compatible with a metastatic deposit.        02/02/2016 Imaging    MRI T-spine: 1. Extensive metastatic disease throughout the cervical and thoracic spine with a subtle pathologic compression fracture of T8. Tumor extends through the posterior margin of the T8 vertebra into the spinal canal without spinal cord compression at this time. 2. No other pathologic fractures of the thoracic spine at this time.      02/10/2016 Procedure    US biopsy of liver lesion  02/15/2016 Pathology Results    Liver, needle/core biopsy, right lobe METASTATIC CARCINOMA, CONSISTENT WITH BREAST DUCTAL CARCINOMA.      02/15/2016 - 02/28/2016 Chemotherapy    Letrozole 2.5 mg daily + Kisqali (600 mg) days 1-21 every 28 days       02/21/2016 Pathology Results    Breast Prognostic profile from liver biopsy: HER2 POSITIVE, ER/PR NEGATIVE (0%).      02/27/2016 - 03/09/2016 Radiation Therapy    Palliative XRT to T7-T11 with Dr. Lianne Cure; 30 Gy in 10 fractions.       02/28/2016 Treatment Plan Change    Due to HER2 positivity and ER/PR negativity from liver biopsy, change to systemic chemotherapy is recommended.       03/06/2016 -  Chemotherapy    Docetaxel/Herceptin/Perjeta with Neulasta support.      03/11/2016 - 03/15/2016 Hospital Admission    Admit date: 03/11/2016 Admission diagnosis: Febrile neutropenia Additional comments: COPD exacerbation      03/27/2016 Treatment Plan Change    Docetaxel dose reduced by 20%.      03/27/2016 Adverse Reaction    Demonstrating early signs of palmar-plantar erythrodysesthesia.      03/27/2016 Treatment Plan Change    Defer treatment 1 week.      04/10/2016 Imaging    MUGA- The left ventricular ejection fraction is equal to 56.2%. Normal left ventricular wall motion.      06/04/2016 PET scan    1. No residual hypermetabolism within previously hypermetabolic lesions in the left axilla, right latissimus dorsi muscle, anterior right pleural space, liver, left external iliac lymph node and bones. 2. Aortic atherosclerosis (ICD10-170.0). Coronary artery calcification.      07/25/2016 Treatment Plan Change    Started Nerlynx      09/03/2016 PET scan     Interval development of small to moderate right pleural effusion associated with a linear band of hypermetabolism in the posterior right costophrenic sulcus ( SUV max = 5.7) . Imaging features are concerning for recurrent posterior right pleural disease.      09/06/2016 Treatment Plan Change    Stopped Nerlyn due to evidence of recurrent posterior right pleural disease.       09/11/2016 Treatment Plan Change    Started Kadcyla      11/30/2016 PET scan    IMPRESSION: 1. Reduction in volume of RIGHT pleural effusion and resolution of metabolic activity RIGHT at lung base most consistent with resolving inflammatory or infectious process. 2. No evidence disease progression. 3. Multiple sclerotic skeletal metastasis throughout the skeleton. Several foci of mild metabolic activity interspersed. No significant change.         Problem List Patient Active Problem List   Diagnosis Date Noted  . Febrile  neutropenia ([place]) [D70.9, R50.81] 03/11/2016  . COPD (chronic obstructive pulmonary disease) ([place]) [J44.9] 03/11/2016  . Goals of care, counseling/discussion [Z71.89] 02/29/2016  . Bone metastases ([place]) [C79.51] 02/15/2016  . Elevated troponin [R74.8]   . Altered mental status [R41.82] 12/18/2015  . [name], stage 4, left ([place]) [C50.912] 12/16/2015  . Tobacco abuse [Z72.0] 06/22/2015  . Vitamin D deficiency [E55.9] 04/16/2014  . GAD (generalized anxiety disorder) [F41.1] 04/16/2014  . GERD (gastroesophageal reflux disease) [K21.9] 04/16/2014  . Osteopenia [M85.80] 11/18/2013  . Lymphedema of arm [I89.0] 10/14/2012  . Shortness of breath [R06.02] 03/11/2011  . Hypertension [I10] 09/06/2010  . Hyperlipidemia [E78.5] 09/06/2010    Past Medical History Past Medical History:  Diagnosis Date  . Anxiety   . Bone  metastases ([place]) 02/15/2016  . Breast CA ([place]) 07/01/2010   rt breast ca  . COPD (chronic obstructive pulmonary disease) ([place])   . Family history of breast cancer    Aunt on father's side  . Hyperlipemia   . Hypertension   . MRSA (methicillin resistant staph aureus) culture positive    in breast  . Neuropathy    bilateral toes  . Nipple discharge    with pain, infection, lump  . S/P radiation therapy 01/08/11 - 02/22/11   Right Chest Wall/ 50 Gy / 25 Fractions, Right [place]/ 50 gy/25 Fractions, Right Axillary Boost/500 cGy/25 Fractions, Right Chest Wall Boost/10 Gy/5 Fractions  . Stage III (T4N3) R Br cancer  06/21/2010   Letrozole 2.5 mg which she started on 03/09/2011.  This patient presented to my office on Jun 21, 2010 with a large ulcerated right breast mass and bleeding. She underwent additional workup at that time and a biopsy was done showing invasive breast cancer. She was staged and was found to have a T4  disease that was ER, PR, HER-2 positive. CA 2729 was normal at 31. She is undergoing chemotherapy wi  . Status post chemotherapy    6  cycles of carboplatin and docetaxel with trastuzumab every 21 days followed by surgery  . Tobacco abuse 06/22/2015  . Use of letrozole ([place]) 03/09/2011  . Wears dentures     Past Surgical History Past Surgical History:  Procedure Laterality Date  . BREAST SURGERY  2012  . DILATION AND CURETTAGE OF UTERUS    . MASTECTOMY MODIFIED RADICAL Left 12/16/2015   Procedure: MASTECTOMY MODIFIED RADICAL;  Surgeon: Aviva Signs, MD;  Location: [facility];  Service: General;  Laterality: Left;  . port a catheter insertion    . PORT-A-CATH REMOVAL Left 01/15/2013   Procedure: REMOVAL PORT-A-CATH;  Surgeon: Scherry Ran, MD;  Location: [facility];  Service: General;  Laterality: Left;  . PORTACATH PLACEMENT Right 01/16/2016   Procedure: INSERTION PORT-A-CATH;  Surgeon: Aviva Signs, MD;  Location: [facility];  Service: General;  Laterality: Right;  . TUBAL LIGATION      Family History Family History  Problem Relation Age of Onset  . COPD Mother   . Diabetes Father   . Hypertension Father   . COPD Brother   . Cancer Maternal Aunt        ovarian - died of old age  . Cancer Paternal Aunt        had breast ca; died of of a different cancer  . Lung cancer Brother      Social History  reports that she quit smoking about 20 months ago. Her smoking use included cigarettes. She has a 7.50 pack-year smoking history. She has never used smokeless tobacco. She reports that she does not drink alcohol or use drugs.  Medications  Current Outpatient Medications:  .  Ado-Trastuzumab Emtansine (KADCYLA IV), Inject into the vein. Every 3 weeks, Disp: , Rfl:  .  albuterol (PROVENTIL HFA;VENTOLIN HFA) 108 (90 Base) MCG/ACT inhaler, Inhale 2 puffs into the lungs every 6 (six) hours as needed for wheezing or shortness of breath., Disp: , Rfl:  .  albuterol (PROVENTIL) (2.5 MG/3ML) 0.083% nebulizer solution, Inhale 3 mLs into the lungs every 6 (six) hours as needed for wheezing or shortness of breath., Disp: 75 mL,  Rfl: 12 .  CARAFATE 1 GM/10ML suspension, TAKE 10 MLS BY MOUTH 4 TIMES DAILY WITH MEALS AND AT BEDTIME, Disp: 420 mL, Rfl: 2 .  cholecalciferol (VITAMIN D) 1000 units tablet, Take 2,000 Units by mouth daily., Disp: , Rfl:  .  fexofenadine (ALLEGRA) 180 MG tablet, Take 180 mg by mouth daily.  , Disp: , Rfl:  .  furosemide (LASIX) 40 MG tablet, TAKE 1 TABLET BY MOUTH ONCE DAILY, Disp: 90 tablet, Rfl: 0 .  Ginkgo Biloba 120 MG TABS, Take 120 mg by mouth daily., Disp: , Rfl:  .  lidocaine-prilocaine (EMLA) cream, Apply a quarter size amount to affected area 1 hour prior to coming to chemotherapy., Disp: 30 g, Rfl: 2 .  loperamide (IMODIUM A-D) 2 MG tablet, Days 1-14 take 4 mg 3 times a day  Days 15-56 take 4 mg 2 times a day  Days 57-365 take 4 mg as needed (max 16 mg/day), Disp: 120 tablet, Rfl: 3 .  LORazepam (ATIVAN) 0.5 MG tablet, Take 1 tablet (0.5 mg total) by mouth every 8 (eight) hours., Disp: 90 tablet, Rfl: 2 .  losartan (COZAAR) 100 MG tablet, Take 0.5 tablets (50 mg total) by mouth daily., Disp: 90 tablet, Rfl: 1 .  magic mouthwash w/lidocaine SOLN, Take 5 mLs by mouth 4 (four) times daily as needed for mouth pain., Disp: 240 mL, Rfl: 1 .  Multiple Vitamins-Minerals (EMERGEN-C IMMUNE PO), Take 1 tablet by mouth daily., Disp: , Rfl:  .  naproxen sodium (ANAPROX) 220 MG tablet, Take 220 mg by mouth 2 (two) times daily as needed (pain). pain, Disp: , Rfl:  .  omeprazole (PRILOSEC OTC) 20 MG tablet, Take 40 mg by mouth daily., Disp: , Rfl:  .  potassium chloride SA (KLOR-CON M20) 20 MEQ tablet, Take 1 tablet (20 mEq total) by mouth 3 (three) times daily., Disp: 180 tablet, Rfl: 0 .  prochlorperazine (COMPAZINE) 10 MG tablet, Take 1 tablet (10 mg total) by mouth every 6 (six) hours as needed for nausea or vomiting., Disp: 30 tablet, Rfl: 2 .  simethicone (MYLICON) 80 MG chewable tablet, Chew 2 tablets (160 mg total) by mouth 4 (four) times daily as needed for flatulence., Disp: 30 tablet, Rfl:  0 .  SYMBICORT 160-4.5 MCG/ACT inhaler, INHALE TWO PUFFS BY MOUTH TWICE DAILY, Disp: 6 g, Rfl: 5 No current facility-administered medications for this visit.   Facility-Administered Medications Ordered in Other Visits:  .  acetaminophen (TYLENOL) tablet 650 mg, 650 mg, Oral, Once, Ryanna Teschner, MD .  diphenhydrAMINE (BENADRYL) capsule 50 mg, 50 mg, Oral, Once, Aquiles Ruffini, MD .  heparin lock flush 100 unit/mL, 500 Units, Intravenous, Once, Twana First, MD .  heparin lock flush 100 unit/mL, 500 Units, Intracatheter, Once PRN, Katilyn Miltenberger, Mathis Dad, MD .  sodium chloride flush (NS) 0.9 % injection 10 mL, 10 mL, Intravenous, PRN, Twana First, MD .  sodium chloride flush (NS) 0.9 % injection 10 mL, 10 mL, Intracatheter, PRN, Georganna Maxson, MD, 10 mL at 09/04/17 0945  Allergies Patient has no known allergies.  Review of Systems Review of Systems - Oncology ROS negative other than fatigue   Physical Exam  Vitals Wt Readings from Last 3 Encounters:  09/04/17 142 lb 6.7 oz (64.6 kg)  08/14/17 145 lb 4.5 oz (65.9 kg)  07/24/17 151 lb 14.4 oz (68.9 kg)   Temp Readings from Last 3 Encounters:  09/04/17 98.4 F (36.9 C) (Oral)  08/14/17 97.7 F (36.5 C) (Oral)  07/24/17 98 F (36.7 C) (Oral)   BP Readings from Last 3 Encounters:  09/04/17 107/63  08/14/17 93/63  07/24/17 (!) 105/48   Pulse Readings from Last 3 Encounters:  09/04/17 (!) 102  08/14/17 78  07/24/17 79   Constitutional: Well-developed, well-nourished, and in no distress.   HENT: Head: Normocephalic and atraumatic.  Mouth/Throat: No oropharyngeal exudate. Mucosa moist. Eyes: Pupils are equal, round, and reactive to light. Conjunctivae are normal. No scleral icterus.  Neck: Normal range of motion. Neck supple. No JVD present.  Cardiovascular: Normal rate, regular rhythm and normal heart sounds.  Exam reveals no gallop and no friction rub.   No murmur heard. Pulmonary/Chest: Effort normal and breath sounds normal. No  respiratory distress. No wheezes.No rales.  Abdominal: Soft. Bowel sounds are normal. No distension. There is no tenderness. There is no guarding.  Musculoskeletal: No edema or tenderness.  Lymphadenopathy: No cervical, axillary or supraclavicular adenopathy.  Neurological: Alert and oriented to person, place, and time. No cranial nerve deficit.  Skin: Skin is warm and dry. No rash noted. No erythema. No pallor.  Psychiatric: Affect and judgment normal.   Labs Infusion on 09/04/2017  Component Date Value Ref Range Status  . LDH 09/04/2017 155  98 - 192 U/L Final   Performed at [facility], [place], [address], [place], [place] [address]  . Sodium 09/04/2017 138  135 - 145 mmol/L Final  . Potassium 09/04/2017 3.8  3.5 - 5.1 mmol/L Final  . Chloride 09/04/2017 96* 98 - 111 mmol/L Final  . CO2 09/04/2017 34* 22 - 32 mmol/L Final  . Glucose, Bld 09/04/2017 115* 70 - 99 mg/dL Final  . BUN 09/04/2017 11  8 - 23 mg/dL Final  . Creatinine, Ser 09/04/2017 0.51  0.44 - 1.00 mg/dL Final  . Calcium 09/04/2017 9.4  8.9 - 10.3 mg/dL Final  . Total Protein 09/04/2017 7.6  6.5 - 8.1 g/dL Final  . Albumin 09/04/2017 3.4* 3.5 - 5.0 g/dL Final  . AST 09/04/2017 60* 15 - 41 U/L Final  . ALT 09/04/2017 31  0 - 44 U/L Final  . Alkaline Phosphatase 09/04/2017 118  38 - 126 U/L Final  . Total Bilirubin 09/04/2017 0.4  0.3 - 1.2 mg/dL Final  . GFR calc non Af Amer 09/04/2017 >60  >60 mL/min Final  . GFR calc Af Amer 09/04/2017 >60  >60 mL/min Final   Comment: (NOTE) The eGFR has been calculated using the CKD EPI equation. This calculation has not been validated in all clinical situations. eGFR's persistently <60 mL/min signify possible Chronic Kidney Disease.   Georgiann Hahn gap 09/04/2017 8  5 - 15 Final   Performed at [facility], [address] [place], [place] [address]  . WBC 09/04/2017 8.6  4.0 - 10.5 K/uL Final  . RBC 09/04/2017 4.26  3.87 - 5.11 MIL/uL Final  . Hemoglobin 09/04/2017 11.4* 12.0 - 15.0  g/dL Final  . HCT 09/04/2017 37.8  36.0 - 46.0 % Final  . MCV 09/04/2017 88.7  78.0 - 100.0 fL Final  . MCH 09/04/2017 26.8  26.0 - 34.0 pg Final  . MCHC 09/04/2017 30.2  30.0 - 36.0 g/dL Final  . RDW 09/04/2017 17.7* 11.5 - 15.5 % Final  . Platelets 09/04/2017 202  150 - 400 K/uL Final  . Neutrophils Relative % 09/04/2017 64  % Final  . Neutro Abs 09/04/2017 5.5  1.7 - 7.7 K/uL Final  . Lymphocytes Relative 09/04/2017 19  % Final  . Lymphs Abs 09/04/2017 1.6  0.7 - 4.0 K/uL Final  . Monocytes Relative 09/04/2017 15  % Final  . Monocytes Absolute 09/04/2017 1.3* 0.1 - 1.0 K/uL Final  . Eosinophils Relative  09/04/2017 2  % Final  . Eosinophils Absolute 09/04/2017 0.2  0.0 - 0.7 K/uL Final  . Basophils Relative 09/04/2017 0  % Final  . Basophils Absolute 09/04/2017 0.0  0.0 - 0.1 K/uL Final   Performed at [facility], [address] [place],  [address]     Pathology Orders Placed This Encounter  Procedures  . CBC with Differential/Platelet    Standing Status:   Future    Standing Expiration Date:   09/05/2018  . Comprehensive metabolic panel    Standing Status:   Future    Standing Expiration Date:   09/05/2018  . Lactate dehydrogenase    Standing Status:   Future    Standing Expiration Date:   09/05/2018       Zoila Shutter MD

## 2017-09-13 ENCOUNTER — Ambulatory Visit (HOSPITAL_COMMUNITY)
Admission: RE | Admit: 2017-09-13 | Discharge: 2017-09-13 | Disposition: A | Discharge status: Home / Self Care | Payer: Managed Care, Other (non HMO) | Source: Ambulatory Visit | Attending: Internal Medicine | Admitting: Internal Medicine

## 2017-09-13 DIAGNOSIS — E785 Hyperlipidemia, unspecified: Secondary | ICD-10-CM | POA: Insufficient documentation

## 2017-09-13 DIAGNOSIS — C50912 Malignant neoplasm of unspecified site of left female breast: Secondary | ICD-10-CM | POA: Diagnosis present

## 2017-09-13 DIAGNOSIS — Z9013 Acquired absence of bilateral breasts and nipples: Secondary | ICD-10-CM | POA: Diagnosis not present

## 2017-09-13 DIAGNOSIS — I119 Hypertensive heart disease without heart failure: Secondary | ICD-10-CM | POA: Insufficient documentation

## 2017-09-13 DIAGNOSIS — J449 Chronic obstructive pulmonary disease, unspecified: Secondary | ICD-10-CM | POA: Insufficient documentation

## 2017-09-13 DIAGNOSIS — Z87891 Personal history of nicotine dependence: Secondary | ICD-10-CM | POA: Diagnosis not present

## 2017-09-13 NOTE — Progress Notes (Signed)
*  PRELIMINARY RESULTS* Echocardiogram 2D Echocardiogram has been performed.  Sheila Garrison 09/13/2017, 11:58 AM

## 2017-09-20 ENCOUNTER — Ambulatory Visit (HOSPITAL_COMMUNITY)
Admission: RE | Admit: 2017-09-20 | Discharge: 2017-09-20 | Disposition: A | Discharge status: Home / Self Care | Payer: Managed Care, Other (non HMO) | Source: Ambulatory Visit | Attending: Internal Medicine | Admitting: Internal Medicine

## 2017-09-20 DIAGNOSIS — D4411 Neoplasm of uncertain behavior of right adrenal gland: Secondary | ICD-10-CM | POA: Diagnosis not present

## 2017-09-20 DIAGNOSIS — C50912 Malignant neoplasm of unspecified site of left female breast: Secondary | ICD-10-CM

## 2017-09-20 DIAGNOSIS — R222 Localized swelling, mass and lump, trunk: Secondary | ICD-10-CM | POA: Insufficient documentation

## 2017-09-20 LAB — GLUCOSE, CAPILLARY: GLUCOSE-CAPILLARY: 114 mg/dL — AB (ref 70–99)

## 2017-09-20 MED ORDER — FLUDEOXYGLUCOSE F - 18 (FDG) INJECTION
7.0700 | Freq: Once | INTRAVENOUS | Status: AC | PRN
  Administered 2017-09-20: 7.07 via INTRAVENOUS

## 2017-09-23 ENCOUNTER — Other Ambulatory Visit (HOSPITAL_COMMUNITY): Payer: Managed Care, Other (non HMO)

## 2017-09-25 ENCOUNTER — Encounter (HOSPITAL_COMMUNITY): Payer: Self-pay | Admitting: Internal Medicine

## 2017-09-25 ENCOUNTER — Other Ambulatory Visit (HOSPITAL_COMMUNITY): Payer: Self-pay | Admitting: Internal Medicine

## 2017-09-25 ENCOUNTER — Inpatient Hospital Stay (HOSPITAL_BASED_OUTPATIENT_CLINIC_OR_DEPARTMENT_OTHER): Payer: Managed Care, Other (non HMO) | Admitting: Internal Medicine

## 2017-09-25 ENCOUNTER — Inpatient Hospital Stay (HOSPITAL_COMMUNITY): Payer: Managed Care, Other (non HMO) | Attending: Hematology & Oncology

## 2017-09-25 ENCOUNTER — Inpatient Hospital Stay (HOSPITAL_COMMUNITY): Payer: Managed Care, Other (non HMO)

## 2017-09-25 ENCOUNTER — Telehealth (HOSPITAL_COMMUNITY): Payer: Self-pay | Admitting: Internal Medicine

## 2017-09-25 VITALS — BP 109/58 | HR 87 | Temp 98.2°F | Resp 20 | Wt 133.7 lb

## 2017-09-25 VITALS — BP 134/68 | HR 106 | Temp 97.7°F | Resp 22

## 2017-09-25 DIAGNOSIS — I722 Aneurysm of renal artery: Secondary | ICD-10-CM | POA: Insufficient documentation

## 2017-09-25 DIAGNOSIS — C50912 Malignant neoplasm of unspecified site of left female breast: Secondary | ICD-10-CM | POA: Diagnosis not present

## 2017-09-25 DIAGNOSIS — Z803 Family history of malignant neoplasm of breast: Secondary | ICD-10-CM

## 2017-09-25 DIAGNOSIS — Z8041 Family history of malignant neoplasm of ovary: Secondary | ICD-10-CM

## 2017-09-25 DIAGNOSIS — Z923 Personal history of irradiation: Secondary | ICD-10-CM | POA: Diagnosis not present

## 2017-09-25 DIAGNOSIS — R110 Nausea: Secondary | ICD-10-CM | POA: Insufficient documentation

## 2017-09-25 DIAGNOSIS — G629 Polyneuropathy, unspecified: Secondary | ICD-10-CM | POA: Insufficient documentation

## 2017-09-25 DIAGNOSIS — Z79899 Other long term (current) drug therapy: Secondary | ICD-10-CM | POA: Insufficient documentation

## 2017-09-25 DIAGNOSIS — I700 Atherosclerosis of aorta: Secondary | ICD-10-CM | POA: Insufficient documentation

## 2017-09-25 DIAGNOSIS — R948 Abnormal results of function studies of other organs and systems: Secondary | ICD-10-CM

## 2017-09-25 DIAGNOSIS — Z87891 Personal history of nicotine dependence: Secondary | ICD-10-CM | POA: Diagnosis not present

## 2017-09-25 DIAGNOSIS — Z9011 Acquired absence of right breast and nipple: Secondary | ICD-10-CM

## 2017-09-25 DIAGNOSIS — Z9221 Personal history of antineoplastic chemotherapy: Secondary | ICD-10-CM

## 2017-09-25 DIAGNOSIS — Z801 Family history of malignant neoplasm of trachea, bronchus and lung: Secondary | ICD-10-CM | POA: Insufficient documentation

## 2017-09-25 DIAGNOSIS — C7951 Secondary malignant neoplasm of bone: Secondary | ICD-10-CM

## 2017-09-25 DIAGNOSIS — I10 Essential (primary) hypertension: Secondary | ICD-10-CM | POA: Insufficient documentation

## 2017-09-25 DIAGNOSIS — Z79811 Long term (current) use of aromatase inhibitors: Secondary | ICD-10-CM

## 2017-09-25 DIAGNOSIS — E785 Hyperlipidemia, unspecified: Secondary | ICD-10-CM

## 2017-09-25 DIAGNOSIS — Z5112 Encounter for antineoplastic immunotherapy: Secondary | ICD-10-CM | POA: Diagnosis not present

## 2017-09-25 DIAGNOSIS — E279 Disorder of adrenal gland, unspecified: Secondary | ICD-10-CM

## 2017-09-25 DIAGNOSIS — J439 Emphysema, unspecified: Secondary | ICD-10-CM | POA: Insufficient documentation

## 2017-09-25 DIAGNOSIS — C773 Secondary and unspecified malignant neoplasm of axilla and upper limb lymph nodes: Secondary | ICD-10-CM | POA: Diagnosis not present

## 2017-09-25 LAB — CBC WITH DIFFERENTIAL/PLATELET
Basophils Absolute: 0 10*3/uL (ref 0.0–0.1)
Basophils Relative: 0 %
EOS PCT: 2 %
Eosinophils Absolute: 0.2 10*3/uL (ref 0.0–0.7)
HCT: 39.7 % (ref 36.0–46.0)
Hemoglobin: 12.4 g/dL (ref 12.0–15.0)
LYMPHS PCT: 10 %
Lymphs Abs: 0.9 10*3/uL (ref 0.7–4.0)
MCH: 27.9 pg (ref 26.0–34.0)
MCHC: 31.2 g/dL (ref 30.0–36.0)
MCV: 89.4 fL (ref 78.0–100.0)
MONO ABS: 1.1 10*3/uL — AB (ref 0.1–1.0)
Monocytes Relative: 12 %
Neutro Abs: 7 10*3/uL (ref 1.7–7.7)
Neutrophils Relative %: 76 %
PLATELETS: 210 10*3/uL (ref 150–400)
RBC: 4.44 MIL/uL (ref 3.87–5.11)
RDW: 18.8 % — AB (ref 11.5–15.5)
WBC: 9.2 10*3/uL (ref 4.0–10.5)

## 2017-09-25 LAB — COMPREHENSIVE METABOLIC PANEL
ALT: 51 U/L — ABNORMAL HIGH (ref 0–44)
AST: 71 U/L — AB (ref 15–41)
Albumin: 3.6 g/dL (ref 3.5–5.0)
Alkaline Phosphatase: 127 U/L — ABNORMAL HIGH (ref 38–126)
Anion gap: 11 (ref 5–15)
BUN: 15 mg/dL (ref 8–23)
CO2: 38 mmol/L — ABNORMAL HIGH (ref 22–32)
Calcium: 9.8 mg/dL (ref 8.9–10.3)
Chloride: 89 mmol/L — ABNORMAL LOW (ref 98–111)
Creatinine, Ser: 0.57 mg/dL (ref 0.44–1.00)
GFR calc Af Amer: >60 mL/min (ref >60)
Glucose, Bld: 146 mg/dL — ABNORMAL HIGH (ref 70–99)
POTASSIUM: 3.4 mmol/L — AB (ref 3.5–5.1)
Sodium: 138 mmol/L (ref 135–145)
Total Bilirubin: 1 mg/dL (ref 0.3–1.2)
Total Protein: 7.7 g/dL (ref 6.5–8.1)

## 2017-09-25 LAB — LACTATE DEHYDROGENASE: LDH: 157 U/L (ref 98–192)

## 2017-09-25 MED ORDER — DIPHENHYDRAMINE HCL 25 MG PO CAPS
50.0000 mg | ORAL_CAPSULE | Freq: Once | ORAL | Status: DC
  Filled 2017-09-25: qty 2

## 2017-09-25 MED ORDER — CAPECITABINE 500 MG PO TABS: 2000.0000 mg | ORAL_TABLET | Freq: Two times a day (BID) | ORAL | 4 refills | Status: AC

## 2017-09-25 MED ORDER — CAPECITABINE 500 MG PO TABS: 2000.0000 mg | ORAL_TABLET | Freq: Two times a day (BID) | ORAL | 4 refills | Status: DC

## 2017-09-25 MED ORDER — SODIUM CHLORIDE 0.9 % IV SOLN
840.0000 mg | Freq: Once | INTRAVENOUS | Status: AC
  Administered 2017-09-25: 840 mg via INTRAVENOUS
  Filled 2017-09-25: qty 28

## 2017-09-25 MED ORDER — HEPARIN SOD (PORK) LOCK FLUSH 100 UNIT/ML IV SOLN
500.0000 [IU] | Freq: Once | INTRAVENOUS | Status: AC | PRN
  Administered 2017-09-25: 500 [IU]

## 2017-09-25 MED ORDER — SODIUM CHLORIDE 0.9% FLUSH
10.0000 mL | INTRAVENOUS | Status: DC | PRN
  Administered 2017-09-25: 10 mL
  Filled 2017-09-25: qty 10

## 2017-09-25 MED ORDER — SODIUM CHLORIDE 0.9 % IV SOLN
450.0000 mg | Freq: Once | INTRAVENOUS | Status: AC
  Administered 2017-09-25: 450 mg via INTRAVENOUS
  Filled 2017-09-25: qty 21.43

## 2017-09-25 MED ORDER — SODIUM CHLORIDE 0.9 % IV SOLN
Freq: Once | INTRAVENOUS | Status: AC
  Administered 2017-09-25: 12:00:00 via INTRAVENOUS

## 2017-09-25 MED ORDER — ACETAMINOPHEN 325 MG PO TABS
650.0000 mg | ORAL_TABLET | Freq: Once | ORAL | Status: DC
  Filled 2017-09-25: qty 2

## 2017-09-25 NOTE — Progress Notes (Signed)
ECHO 09/13/2017  Reviewed CMET with Dr. Walden Field with ALT 51 and AST 71 with verbal order to treat today Dr. Walden Field.    Consent signed for new chemotherapy and verified for prior authorization is good.  All questions asked and answered.    Patient tolerated chemotherapy with no complaints voiced.  Good blood return noted before and after administration of chemotherapy.  Lab work drawn for tumor markers without difficulty.  Port site clean and dry with no bruising or swelling noted.  No complaints with flush.  Band aid applied.  VSS with discharge and left by wheelchair with family with no s/s of distress noted.

## 2017-09-25 NOTE — Patient Instructions (Signed)
[facility] Discharge Instructions  You saw Dr. Higgs today.   Thank you for choosing [facility] to provide your oncology and hematology care.  To afford each patient quality time with our provider, please arrive at least 15 minutes before your scheduled appointment time.   If you have a lab appointment with the Cancer Center please come in thru the  Main Entrance and check in at the main information desk  You need to re-schedule your appointment should you arrive 10 or more minutes late.  We strive to give you quality time with our providers, and arriving late affects you and other patients whose appointments are after yours.  Also, if you no show three or more times for appointments you may be dismissed from the clinic at the providers discretion.     Again, thank you for choosing [facility].  Our hope is that these requests will decrease the amount of time that you wait before being seen by our physicians.       _____________________________________________________________  Should you have questions after your visit to [facility], please contact our office at [phone] between the hours of 8:00 a.m. and 4:30 p.m.  Voicemails left after 4:00 p.m. will not be returned until the following business day.  For prescription refill requests, have your pharmacy contact our office and allow 72 hours.    Cancer Center Support Programs:   > Cancer Support Group  2nd Tuesday of the month 1pm-2pm, Journey Room    

## 2017-09-25 NOTE — Progress Notes (Signed)
Diagnosis No diagnosis found.  Staging Cancer Staging [name], stage 4, left ([place]) Staging form: Breast, AJCC 8th Edition - Pathologic stage from 12/26/2015: Stage IIB (pT3, pN2a, cM0, G2, ER: Positive, PR: Positive, HER2: Negative) - Signed by Baird Cancer, PA-C on 02/15/2016 - Pathologic stage from 02/15/2016: Stage IV (pT3, pN2a, pM1, ER: Negative, PR: Negative, HER2: Positive) - Signed by Baird Cancer, PA-C on 02/28/2016   Assessment and Plan:   1.  [name], stage 4, left (HCC)62 y.o. 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 Pt was last treated with ado-trastuzumab (Kadcyla) on 09/04/2017.  She was been on the medication since 09/11/2016.  Pt is here for follow-up.  Pet scan done 09/20/2017 reviewed and showed   IMPRESSION: 1. Metastatic breast cancer recurrence in somewhat atypical pattern with two hypermetabolic nodules in the RIGHT back musculature and subcutaneous tissue of the RIGHT back. 2. New hypermetabolic high LEFT axillary lymph node. 3. Enlarged hypermetabolic RIGHT adrenal gland and hypermetabolic LEFT adrenal gland concerning for adrenal metastasis. 4. No evidence of liver metastasis. 5. No metabolic activity associated stable sclerotic lesions in the skeleton consistent treated metastasis.  No palpable abnormalities noted on exam.  Pt denies any back pain.  Pt had findings in the back musculature previously, but due to the findings of right back musculature and new left axillary LN as well as enlarged right and left adrenal glands, I have discussed with her change in therapy based on results of the Pherexa trial that showed Xeloda, Herceptin, Perjeta had clinical efficacy in pts who were previously treated with Herceptin.    Review of chart shows she was treated with Taxotere, Herceptin and Perjeta from 03/06/2016 to 06/05/2016.  Therapy was stopped due to problems with  febrile neutropenia and HFS.  PET scan that was done 06/04/2017 showed a response to therapy with  IMPRESSION: 1. No residual hypermetabolism within previously hypermetabolic lesions in the left axilla, right latissimus dorsi muscle, anterior right pleural space, liver, left external iliac lymph node and bones. 2. Aortic atherosclerosis (ICD10-170.0). Coronary artery Calcification.  Based on this review and results of the Pherexa trial, it is reasonable to retreat with Herceptin and Perjeta in combination with Xeloda.  Side effects of the medications were reviewed with pt and she was provided written information. She has recent ECHO done 09/13/2017 that showed an EF of 60-65%.  She will continue to have heart function monitoring as therapy proceeds.    Herceptin is dosed at 8 mg/kg loading followed by 6 mg/kg every 3 weeks.  Perjeta is dosed at 840 mg loading dose followed by 420 mg IV every 3 weeks.  Xeloda is dosed at 1250 mg/m2 bid for 14 days with 7 days off every 21 days.  Pt is dosed at 2000 mg bid po for 14 days with 7 days off every 3 week.  She is advised to use imodium if diarrhea occurs once therapy begins.    All questions answered and pt expressed understanding of the information presented.  She will be seen for follow-up in 3 weeks prior to C2 of XHP.    2.  Nausea.  Antiemetics PRN.    3.  Elevated LFTs.  Labs done 09/25/2017 reviewed and showed WBC 9.2 HB 12.4 plts 210,000.  Chemistries WNL with K+ 3.4 Cr 0.57.  Bilirubin 1.  Will repeat labs on RTC.  Pet scan showed no activity in the liver.  Will  check breast cancer tumor markers.    4.   Bone metastases.  She will continue Xgeva every 6 weeks.    5.  Hypertension.  Blood pressure is 134/68. Follow-up with PCP.   6.  Emphysema.  Pt remains on O2 therapy at  4 L.  She has a prior history of smoking but no longer smokes. Follow-up with pulmonary if any change in symptoms.    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   She has normal renal function and this is likely an incidental finding on scan.    Interval history: Historical data from note dated 06/12/2017:  62 y.o. 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  Receiving therapy with ado-trastuzumab (Kadcyla) and Xgeva.    She has a history of:  [name], stage 4, left (HCC) Stage IV (pT3pN2ApM1) invasive ductal carcinoma, biopsy proven to liver, HER2 POSITIVE, ER/PR NEGATIVE.  Complicated by a LEFT invasive lobular carcinoma, Stage IIB (pT3pN2A), S/P left mastectomy by Dr. Arnoldo Morale on 12/16/2015, ER/PR+ 2%. HER2 NEGATIVE. Done prior to knowing about Stage IV disease. AND History with right invasive ductalbreast cancer, Stage IIIC, ER/PR+ and HER 2 POSITIVEin 2012 managed with right mastectomy, systemic chemotherapy, HER2 targeted therapy, XRT, and anti-endocrine therapy   She started palliative XRT to T8 pathologic fracture on 02/27/2016.  Chemo with docetaxel/herceptin/perjeta for 6 cycles: 03/06/16-06/26/16.  Current status: Patient is seen today for follow-up.  She is here to go over Pet scan.  She is complaining of nausea.      Stage III (T4N3) R Br cancer  (Resolved)   06/21/2010 Initial Diagnosis    Stage III (T4N3) R Br cancer     01/04/2011 - 02/22/2011 Radiation Therapy    50 Gy     05/07/2014 Pathology Results    BCI testing- low risk of late recurrence (3.4% between years 5-10), high likelihood of benefit from extended endocrein therapy, and a 67% relative risk reduction when treated with extended endocrine therapy (27% versus 10.5%).     [name], stage 4, left ([place])   11/22/2015 Mammogram    Ultrasound-guided biopsy of the irregular hypoechoic area, with prominent shadowing, in the OUTER left breast at the 1 o'clock axis. 2. Ultrasound-guided biopsy of the irregular mass within the INNER left breast at the 9 o'clock axis. 3.  Ultrasound-guided biopsy of 1 of the enlarged and amorphous lymph nodes in the left axilla.    11/22/2015 Imaging    Ultrasound-guided biopsy of the irregular hypoechoic area, with prominent shadowing, in the OUTER left breast at the 1 o'clock axis. 2. Ultrasound-guided biopsy of the irregular mass within the INNER left breast at the 9 o'clock axis. 3. Ultrasound-guided biopsy of 1 of the enlarged and amorphous lymph nodes in the left axilla.    12/16/2015 Surgery    L modified radical mastectomy with Dr. Arnoldo Morale    12/18/2015 - 12/21/2015 Hospital Admission    Admit date: 12/18/2015 Admission diagnosis: altered MS, acute respiratory failure Additional comments: This likely was a combination of COPD exacerbation along with mild acute on chronic diastolic CHF with  echo shows a preserved EF of around 60%, he was treated with IV steroids and Lasix, she is much better but qualifies for home oxygen which will be provided    12/19/2015 Imaging    No evidence of pulmonary embolus. 2. Small right pleural effusion, with associated atelectasis. Peripheral scarring at  the anterior right upper lobe. Mild bilateral emphysema noted. 3. Scattered coronary artery calcifications seen. 4. Postoperative change at the left chest wall, reflecting recent mastectomy, with vague collections of postoperative fluid seen. 5. Vague sclerotic change and heterogeneity at vertebral body T8 raises concern for sequelae of metastatic disease    01/23/2016 PET scan    1. Widespread multifocal metastatic disease in the visualized axial and appendicular skeleton. 2. Solitary metastatic lesion in the liver. Hypermetabolic irregular left axillary lymph node adjacent to the left axillary clips. 3. Activity in the left breast related to mastectomy. 4. Low-grade activity along the pleuroparenchymal thickening anteriorly in the right chest is probably related to prior therapy rather than current malignancy. 5. Probable  mild compression fracture at T8 associated with the extensive tumor at this level. There is also bony destruction of the right ninth rib posteriorly. 6. Faintly hypermetabolic but small left external iliac lymph nodes merit surveillance  ADDENDUM: The original report was by Dr. Van Clines. The following addendum is by Dr. Van Clines:  I have one other musculoskeletal focus of hypermetabolic activity to mention. In the right latissimus dorsi muscle, there is a small hypermetabolic focus with maximum SUV of 5.4, measuring 7 mm in diameter on image 89/4, compatible with a metastatic deposit.      02/02/2016 Imaging    MRI T-spine: 1. Extensive metastatic disease throughout the cervical and thoracic spine with a subtle pathologic compression fracture of T8. Tumor extends through the posterior margin of the T8 vertebra into the spinal canal without spinal cord compression at this time. 2. No other pathologic fractures of the thoracic spine at this time.    02/10/2016 Procedure    US biopsy of liver lesion    02/15/2016 Pathology Results    Liver, needle/core biopsy, right lobe METASTATIC CARCINOMA, CONSISTENT WITH BREAST DUCTAL CARCINOMA.    02/15/2016 - 02/28/2016 Chemotherapy    Letrozole 2.5 mg daily + Kisqali (600 mg) days 1-21 every 28 days     02/21/2016 Pathology Results    Breast Prognostic profile from liver biopsy: HER2 POSITIVE, ER/PR NEGATIVE (0%).    02/27/2016 - 03/09/2016 Radiation Therapy    Palliative XRT to T7-T11 with Dr. Lianne Cure; 30 Gy in 10 fractions.     02/28/2016 Treatment Plan Change    Due to HER2 positivity and ER/PR negativity from liver biopsy, change to systemic chemotherapy is recommended.    03/06/2016 -  Chemotherapy    Docetaxel/Herceptin/Perjeta with Neulasta support.    03/11/2016 - 03/15/2016 Hospital Admission    Admit date: 03/11/2016 Admission diagnosis: Febrile neutropenia Additional comments: COPD exacerbation    03/27/2016  Treatment Plan Change    Docetaxel dose reduced by 20%.    03/27/2016 Adverse Reaction    Demonstrating early signs of palmar-plantar erythrodysesthesia.    03/27/2016 Treatment Plan Change    Defer treatment 1 week.    04/10/2016 Imaging    MUGA- The left ventricular ejection fraction is equal to 56.2%. Normal left ventricular wall motion.    06/04/2016 PET scan    1. No residual hypermetabolism within previously hypermetabolic lesions in the left axilla, right latissimus dorsi muscle, anterior right pleural space, liver, left external iliac lymph node and bones. 2. Aortic atherosclerosis (ICD10-170.0). Coronary artery calcification.    07/25/2016 Treatment Plan Change    Started Nerlynx    09/03/2016 PET scan     Interval development of small to moderate right pleural effusion associated with a linear band of hypermetabolism in the posterior  right costophrenic sulcus ( SUV max = 5.7) . Imaging features are concerning for recurrent posterior right pleural disease.    09/06/2016 Treatment Plan Change    Stopped Nerlyn due to evidence of recurrent posterior right pleural disease.     09/11/2016 Treatment Plan Change    Started Kadcyla    11/30/2016 PET scan    IMPRESSION: 1. Reduction in volume of RIGHT pleural effusion and resolution of metabolic activity RIGHT at lung base most consistent with resolving inflammatory or infectious process. 2. No evidence disease progression. 3. Multiple sclerotic skeletal metastasis throughout the skeleton. Several foci of mild metabolic activity interspersed. No significant change.     09/24/2017 -  Chemotherapy    The patient had trastuzumab (HERCEPTIN) 525 mg in sodium chloride 0.9 % 250 mL chemo infusion, 8 mg/kg = 525 mg, Intravenous,  Once, 1 of 5 cycles pertuzumab (PERJETA) 840 mg in sodium chloride 0.9 % 250 mL chemo infusion, 840 mg, Intravenous, Once, 1 of 5 cycles  for chemotherapy treatment.       Problem List Patient Active  Problem List   Diagnosis Date Noted  . Febrile neutropenia ([place]) [D70.9, R50.81] 03/11/2016  . COPD (chronic obstructive pulmonary disease) ([place]) [J44.9] 03/11/2016  . Goals of care, counseling/discussion [Z71.89] 02/29/2016  . Bone metastases ([place]) [C79.51] 02/15/2016  . Elevated troponin [R74.8]   . Altered mental status [R41.82] 12/18/2015  . [name], stage 4, left ([place]) [C50.912] 12/16/2015  . Tobacco abuse [Z72.0] 06/22/2015  . Vitamin D deficiency [E55.9] 04/16/2014  . GAD (generalized anxiety disorder) [F41.1] 04/16/2014  . GERD (gastroesophageal reflux disease) [K21.9] 04/16/2014  . Osteopenia [M85.80] 11/18/2013  . Lymphedema of arm [I89.0] 10/14/2012  . Shortness of breath [R06.02] 03/11/2011  . Hypertension [I10] 09/06/2010  . Hyperlipidemia [E78.5] 09/06/2010    Past Medical History Past Medical History:  Diagnosis Date  . Anxiety   . Bone metastases ([place]) 02/15/2016  . Breast CA ([place]) 07/01/2010   rt breast ca  . COPD (chronic obstructive pulmonary disease) ([place])   . Family history of breast cancer    Aunt on father's side  . Hyperlipemia   . Hypertension   . MRSA (methicillin resistant staph aureus) culture positive    in breast  . Neuropathy    bilateral toes  . Nipple discharge    with pain, infection, lump  . S/P radiation therapy 01/08/11 - 02/22/11   Right Chest Wall/ 50 Gy / 25 Fractions, Right [place]/ 50 gy/25 Fractions, Right Axillary Boost/500 cGy/25 Fractions, Right Chest Wall Boost/10 Gy/5 Fractions  . Stage III (T4N3) R Br cancer  06/21/2010   Letrozole 2.5 mg which she started on 03/09/2011.  This patient presented to my office on Jun 21, 2010 with a large ulcerated right breast mass and bleeding. She underwent additional workup at that time and a biopsy was done showing invasive breast cancer. She was staged and was found to have a T4  disease that was ER, PR, HER-2 positive. CA 2729 was normal at 31. She is undergoing  chemotherapy wi  . Status post chemotherapy    6 cycles of carboplatin and docetaxel with trastuzumab every 21 days followed by surgery  . Tobacco abuse 06/22/2015  . Use of letrozole ([place]) 03/09/2011  . Wears dentures     Past Surgical History Past Surgical History:  Procedure Laterality Date  . BREAST SURGERY  2012  . DILATION AND CURETTAGE OF UTERUS    . MASTECTOMY MODIFIED RADICAL  Left 12/16/2015   Procedure: MASTECTOMY MODIFIED RADICAL;  Surgeon: Aviva Signs, MD;  Location: [facility];  Service: General;  Laterality: Left;  . port a catheter insertion    . PORT-A-CATH REMOVAL Left 01/15/2013   Procedure: REMOVAL PORT-A-CATH;  Surgeon: Scherry Ran, MD;  Location: [facility];  Service: General;  Laterality: Left;  . PORTACATH PLACEMENT Right 01/16/2016   Procedure: INSERTION PORT-A-CATH;  Surgeon: Aviva Signs, MD;  Location: [facility];  Service: General;  Laterality: Right;  . TUBAL LIGATION      Family History Family History  Problem Relation Age of Onset  . COPD Mother   . Diabetes Father   . Hypertension Father   . COPD Brother   . Cancer Maternal Aunt        ovarian - died of old age  . Cancer Paternal Aunt        had breast ca; died of of a different cancer  . Lung cancer Brother      Social History  reports that she quit smoking about 21 months ago. Her smoking use included cigarettes. She has a 7.50 pack-year smoking history. She has never used smokeless tobacco. She reports that she does not drink alcohol or use drugs.  Medications  Current Outpatient Medications:  .  Ado-Trastuzumab Emtansine (KADCYLA IV), Inject into the vein. Every 3 weeks, Disp: , Rfl:  .  albuterol (PROVENTIL HFA;VENTOLIN HFA) 108 (90 Base) MCG/ACT inhaler, Inhale 2 puffs into the lungs every 6 (six) hours as needed for wheezing or shortness of breath., Disp: , Rfl:  .  albuterol (PROVENTIL) (2.5 MG/3ML) 0.083% nebulizer solution, Inhale 3 mLs into the lungs every 6 (six) hours as needed for  wheezing or shortness of breath., Disp: 75 mL, Rfl: 12 .  CARAFATE 1 GM/10ML suspension, TAKE 10 MLS BY MOUTH 4 TIMES DAILY WITH MEALS AND AT BEDTIME, Disp: 420 mL, Rfl: 2 .  cholecalciferol (VITAMIN D) 1000 units tablet, Take 2,000 Units by mouth daily., Disp: , Rfl:  .  fexofenadine (ALLEGRA) 180 MG tablet, Take 180 mg by mouth daily.  , Disp: , Rfl:  .  furosemide (LASIX) 40 MG tablet, TAKE 1 TABLET BY MOUTH ONCE DAILY, Disp: 90 tablet, Rfl: 0 .  Ginkgo Biloba 120 MG TABS, Take 120 mg by mouth daily., Disp: , Rfl:  .  lidocaine-prilocaine (EMLA) cream, Apply a quarter size amount to affected area 1 hour prior to coming to chemotherapy., Disp: 30 g, Rfl: 2 .  loperamide (IMODIUM A-D) 2 MG tablet, Days 1-14 take 4 mg 3 times a day  Days 15-56 take 4 mg 2 times a day  Days 57-365 take 4 mg as needed (max 16 mg/day), Disp: 120 tablet, Rfl: 3 .  LORazepam (ATIVAN) 0.5 MG tablet, Take 1 tablet (0.5 mg total) by mouth every 8 (eight) hours., Disp: 90 tablet, Rfl: 2 .  losartan (COZAAR) 100 MG tablet, Take 0.5 tablets (50 mg total) by mouth daily., Disp: 90 tablet, Rfl: 1 .  magic mouthwash w/lidocaine SOLN, Take 5 mLs by mouth 4 (four) times daily as needed for mouth pain., Disp: 240 mL, Rfl: 1 .  Multiple Vitamins-Minerals (EMERGEN-C IMMUNE PO), Take 1 tablet by mouth daily., Disp: , Rfl:  .  naproxen sodium (ANAPROX) 220 MG tablet, Take 220 mg by mouth 2 (two) times daily as needed (pain). pain, Disp: , Rfl:  .  omeprazole (PRILOSEC OTC) 20 MG tablet, Take 40 mg by mouth daily., Disp: , Rfl:  .  potassium chloride SA (KLOR-CON M20) 20 MEQ tablet, Take 1 tablet (20 mEq total) by mouth 3 (three) times daily., Disp: 180 tablet, Rfl: 0 .  prochlorperazine (COMPAZINE) 10 MG tablet, Take 1 tablet (10 mg total) by mouth every 6 (six) hours as needed for nausea or vomiting., Disp: 30 tablet, Rfl: 2 .  simethicone (MYLICON) 80 MG chewable tablet, Chew 2 tablets (160 mg total) by mouth 4 (four) times daily as  needed for flatulence., Disp: 30 tablet, Rfl: 0 .  SYMBICORT 160-4.5 MCG/ACT inhaler, INHALE TWO PUFFS BY MOUTH TWICE DAILY, Disp: 6 g, Rfl: 5 .  capecitabine (XELODA) 500 MG tablet, Take 4 tablets (2,000 mg total) by mouth 2 (two) times daily after a meal for 14 days. 14 days on 7 days off Days 1-14 of 21 day cycle., Disp: 112 tablet, Rfl: 4 No current facility-administered medications for this visit.   Facility-Administered Medications Ordered in Other Visits:  .  0.9 %  sodium chloride infusion, , Intravenous, Once, Kendi Defalco, Mathis Dad, MD .  acetaminophen (TYLENOL) tablet 650 mg, 650 mg, Oral, Once, Gamal Todisco, MD .  diphenhydrAMINE (BENADRYL) capsule 50 mg, 50 mg, Oral, Once, Lino Wickliff, MD .  heparin lock flush 100 unit/mL, 500 Units, Intravenous, Once, Twana First, MD .  heparin lock flush 100 unit/mL, 500 Units, Intracatheter, Once PRN, Jalie Eiland, Mathis Dad, MD .  pertuzumab (PERJETA) 840 mg in sodium chloride 0.9 % 250 mL chemo infusion, 840 mg, Intravenous, Once, Mariaclara Spear, MD .  sodium chloride flush (NS) 0.9 % injection 10 mL, 10 mL, Intravenous, PRN, Twana First, MD .  sodium chloride flush (NS) 0.9 % injection 10 mL, 10 mL, Intracatheter, PRN, Kivon Aprea, MD .  trastuzumab (HERCEPTIN) 450 mg in sodium chloride 0.9 % 250 mL chemo infusion, 450 mg, Intravenous, Once, Tijah Hane, Mathis Dad, MD  Allergies Patient has no known allergies.  Review of Systems Review of Systems - Oncology ROS negative other than nausea   Physical Exam  Vitals Wt Readings from Last 3 Encounters:  09/25/17 133 lb 11.2 oz (60.6 kg)  09/04/17 142 lb 6.7 oz (64.6 kg)  08/14/17 145 lb 4.5 oz (65.9 kg)   Temp Readings from Last 3 Encounters:  09/25/17 97.7 F (36.5 C) (Oral)  09/04/17 98.3 F (36.8 C) (Oral)  08/14/17 97.7 F (36.5 C) (Oral)   BP Readings from Last 3 Encounters:  09/25/17 134/68  09/04/17 119/66  08/14/17 93/63   Pulse Readings from Last 3 Encounters:  09/25/17 (!) 106  09/04/17 85   08/14/17 78   Constitutional: Well-developed, well-nourished, and in no distress.   HENT: Head: Normocephalic and atraumatic.  Mouth/Throat: No oropharyngeal exudate. Mucosa moist. Eyes: Pupils are equal, round, and reactive to light. Conjunctivae are normal. No scleral icterus.  Neck: Normal range of motion. Neck supple. No JVD present.  Cardiovascular: Normal rate, regular rhythm and normal heart sounds.  Exam reveals no gallop and no friction rub.   No murmur heard. Pulmonary/Chest: Effort normal and breath sounds normal. No respiratory distress. No wheezes.No rales.  Abdominal: Soft. Bowel sounds are normal. No distension. There is no tenderness. There is no guarding.  Musculoskeletal: No edema or tenderness. No lesions noted on right back area.  Pt denies pain.   Lymphadenopathy: No cervical, axillary or supraclavicular adenopathy.  Neurological: Alert and oriented to person, place, and time. No cranial nerve deficit.  Skin: Skin is warm and dry. No rash noted. No erythema. No pallor.  Psychiatric: Affect and judgment normal.  Chaperone present.  Evidence of bilateral mastectomy. No signs of chest wall recurrence.    Labs No visits with results within 3 Day(s) from this visit.  Latest known visit with results is:  Hospital Outpatient Visit on 09/20/2017  Component Date Value Ref Range Status  . Glucose-Capillary 09/20/2017 114* 70 - 99 mg/dL Final     Pathology No orders of the defined types were placed in this encounter.      Zoila Shutter MD

## 2017-09-25 NOTE — Progress Notes (Addendum)
Teaching information for Xeloda pulled together. Patient taught at bedside. Explained to patient to call clinic and advise [place] when she receives the medication.  She verbalizes understanding.

## 2017-09-25 NOTE — Patient Instructions (Signed)
[facility] Chemotherapy Teaching  You have been diagnosed with stage IV left breast cancer.  You are going to be treated with palliative intent which means that you are treatable but not curable.  You are going to continue getting Herceptin and Perjeta and in addition to those, Dr. Walden Field wants to add Xeloda (capcitabine).  This is a pill that you will take at home daily.  You will see the doctor regularly throughout treatment.  We monitor your lab work regularly throughout treatment. The doctor monitors your response to treatment by the way you are feeling, your blood work, and scans periodically.    Capecitabine (Xeloda)  About This Drug Capecitabine is used to treat cancer. It is given orally (by mouth).  Possible Side Effects . Decrease in red blood cells. This may make you feel more tired. . Nausea and throwing up (vomiting) . Pain in your abdomen . Diarrhea (loose bowel movements) . Tiredness and weakness . Increased total bilirubin in your blood. This may mean that you have changes in your liver function. . Hand-foot syndrome. The palms of your hands or soles of your feet may tingle, become numb, painful, swollen, or red.  Note: Each of the side effects above was reported in 30% or greater of patients treated with capecitabine. Not all possible side effects are included above.  Warnings and Precautions . Abnormal bleeding if you are taking blood thinners such as warfarin - symptoms may be coughing up blood, throwing up blood (may look like coffee grounds), red or black tarry bowel movements, abnormally heavy menstrual flow, nosebleeds or any other unusual bleeding. . Severe diarrhea . Changes in the tissue of the heart and/or heart attack. Some changes may happen that can cause your heart to have less ability to pump blood. . Increase risk of severe side effects if you have a known dihydropyrimidine dehydrogenase deficiency. . Dehydration (lack of water in the  body from losing too much fluid), which may affect how your kidneys work which can be life-threatening. . Severe allergic skin reaction. You may develop blisters on your skin that are filled with fluid or a severe red rash all over your body that may be painful. . Decrease in the number of white blood cells, red blood cells, and platelets. This may raise your risk of infection, make you tired and weak (fatigue), and raise your risk of bleeding. . Patients greater than 16 years of age are at increased risk of severe and life-threatening side effects. . Changes in your liver function, which can cause liver failure.  Note: Some of the side effects above are very rare. If you have concerns and/or questions, please discuss them with your medical team.  How to Take Your Medication . Swallow the medicine whole with water within 30 minutes after a meal. Do not break or crush it. . Missed dose: If you vomit or miss a dose, take your next dose at the regular time, and contact your doctor. Do not take 2 doses at the same time and do not double up on the next dose. Marland Kitchen Handling: Wash your hands after handling your medicine, your caretakers should not handle your medicine with bare hands and should wear latex gloves. . This drug may be present in the saliva, tears, sweat, urine, stool, vomit, semen, and vaginal secretions. Talk to your doctor and/or your nurse about the necessary precautions to take during this time. . Storage: Store this medicine in the original container at room temperature. Keep  lid tightly closed. . Disposal of unused medicine: Do not flush any expired and/or unused medicine down the toilet or drain unless you are specifically instructed to do so on the medication label. Some facilities have take-back programs and/or other options. If you do not have a take-back program in your area, then please discuss with your nurse or your doctor how to dispose of unused medicine.  Treating Side  Effects . Drink plenty of fluids (a minimum of eight glasses per day is recommended). . If you throw up or have loose bowel movements, you should drink more fluids so that you do not become dehydrated (lack of water in the body from losing too much fluid). . If you have diarrhea, eat low-fiber foods that are high in protein and calories and avoid foods that can irritate your digestive tracts or lead to cramping. . Ask your nurse or doctor about medicine that can lessen or stop your diarrhea. . To help with nausea and vomiting, eat small, frequent meals instead of three large meals a day. Choose foods and drinks that are at room temperature. Ask your nurse or doctor about other helpful tips and medicine that is available to help stop or lessen these symptoms. . Manage tiredness by pacing your activities for the day. . Be sure to include periods of rest between energy-draining activities. . To decrease the risk of infection, wash your hands regularly. . Avoid close contact with people who have a cold, the flu, or other infections. . Take your temperature as your doctor or nurse tells you, and whenever you feel like you may have a fever. . To help decrease the risk of bleeding, use a soft toothbrush. Check with your nurse before using dental floss. . Be very careful when using knives or tools. . Use an electric shaver instead of a razor. Marland Kitchen Keeping your pain under control is important to your well-being. Please tell your doctor or nurse if you are experiencing pain. . Avoid sun exposure and apply sunscreen routinely when outdoors. . If you get a rash do not put anything on it unless your doctor or nurse says you may. Keep the area around the rash clean and dry. Ask your doctor for medicine if your rash bothers you.  Food and Drug Interactions . There are no known interactions of capecitabine with food, however this medication should be taken within 30 minutes after a meal. . Check with your  doctor or pharmacist about all other prescription medicines and over-the-counter medicines and dietary supplements (vitamins, minerals, herbs and others) you are taking before starting this medicine as there are known drug interactions with capecitabine. Also, check with your doctor or pharmacist before starting any new prescription or over-the-counter medicines, or dietary supplements to make sure that there are no interactions. . There are known interactions of capecitabine with blood thinning medicine such as warfarin. Ask your doctor what precautions you should take.  When to Call the Doctor Call your doctor or nurse if you have any of these symptoms and/or any new or unusual symptoms: . Fever of 100.4 F (38 C) or higher . Chills . Trouble breathing . Feeling that your heart is beating in a fast or not normal way (palpitations) . Pain in your chest . Chest pain or symptoms of a heart attack. Most heart attacks involve pain in the center of the chest that lasts more than a few minutes. The pain may go away and come back or it can be constant. It  can feel like pressure, squeezing, fullness, or pain. Sometimes pain is felt in one or both arms, the back, neck, jaw, or stomach. If any of these symptoms last 2 minutes, call 911. . Tiredness that interferes with your daily activities . Feeling dizzy or lightheaded . Easy bleeding or bruising . Blood in your urine, vomit (bright red or coffee-ground) and/or stools ( bright red, or black/tarry) . Coughing up blood . Decreased urine . Nausea that stops you from eating or drinking and/or is not relieved by prescribed medicines . Throwing up more than 3 times a day . Lasting loss of appetite or rapid weight loss of five pounds in a week . Diarrhea, 4 times in one day or diarrhea with lack of strength or a feeling of being dizzy . Pain that does not go away or is not relieved by prescribed medicines  SELF CARE ACTIVITIES WHILE ON  CHEMOTHERAPY:  Hydration Increase your fluid intake 48 hours prior to treatment and drink at least 8 to 12 cups (64 ounces) of water/decaffeinated beverages per day after treatment. You can still have your cup of coffee or soda but these beverages do not count as part of your 8 to 12 cups that you need to drink daily. No alcohol intake.  Medications Continue taking your normal prescription medication as prescribed.  If you start any new herbal or new supplements please let us know first to make sure it is safe.  Mouth Care Have teeth cleaned professionally before starting treatment. Keep dentures and partial plates clean. Use soft toothbrush and do not use mouthwashes that contain alcohol. Biotene is a good mouthwash that is available at most pharmacies or may be ordered by calling [phone]. Use warm salt water gargles (1 teaspoon salt per 1 quart warm water) before and after meals and at bedtime. Or you may rinse with 2 tablespoons of three-percent hydrogen peroxide mixed in eight ounces of water. If you are still having problems with your mouth or sores in your mouth please call the clinic. If you need dental work, please let the doctor know before you go for your appointment so that we can coordinate the best possible time for you in regards to your chemo regimen. You need to also let your dentist know that you are actively taking chemo. We may need to do labs prior to your dental appointment.  Skin Care Always use sunscreen that has not expired and with SPF (Sun Protection Factor) of 50 or higher. Wear hats to protect your head from the sun. Remember to use sunscreen on your hands, ears, face, & feet.  Use good moisturizing lotions such as udder cream, eucerin, or even Vaseline. Some chemotherapies can cause dry skin, color changes in your skin and nails.    . Avoid long, hot showers or baths. . Use gentle, fragrance-free soaps and laundry detergent. . Use moisturizers, preferably creams  or ointments rather than lotions because the thicker consistency is better at preventing skin dehydration. Apply the cream or ointment within 15 minutes of showering. Reapply moisturizer at night, and moisturize your hands every time after you wash them.  Hair Loss (if your doctor says your hair will fall out)  . If your doctor says that your hair is likely to fall out, decide before you begin chemo whether you want to wear a wig. You may want to shop before treatment to match your hair color. . Hats, turbans, and scarves can also camouflage hair loss, although some people prefer  to leave their heads uncovered. If you go bare-headed outdoors, be sure to use sunscreen on your scalp. . Cut your hair short. It eases the inconvenience of shedding lots of hair, but it also can reduce the emotional impact of watching your hair fall out. . Don't perm or color your hair during chemotherapy. Those chemical treatments are already damaging to hair and can enhance hair loss. Once your chemo treatments are done and your hair has grown back, it's OK to resume dyeing or perming hair. With chemotherapy, hair loss is almost always temporary. But when it grows back, it may be a different color or texture. In older adults who still had hair color before chemotherapy, the new growth may be completely gray.  Often, new hair is very fine and soft.  Infection Prevention Please wash your hands for at least 30 seconds using warm soapy water. Handwashing is the #1 way to prevent the spread of germs. Stay away from sick people or people who are getting over a cold. If you develop respiratory systems such as green/yellow mucus production or productive cough or persistent cough let us know and we will see if you need an antibiotic. It is a good idea to keep a pair of gloves on when going into grocery stores/[employer] to decrease your risk of coming into contact with germs on the carts, etc. Carry alcohol hand gel with you at all times  and use it frequently if out in public. If your temperature reaches 100.5 or higher please call the clinic and let us know.  If it is after hours or on the weekend please go to the ER if your temperature is over 100.5.  Please have your own personal thermometer at home to use.    Sex and bodily fluids If you are going to have sex, a condom must be used to protect the person that isn't taking chemotherapy. Chemo can decrease your libido (sex drive). For a few days after chemotherapy, chemotherapy can be excreted through your bodily fluids.  When using the toilet please close the lid and flush the toilet twice.  Do this for a few day after you have had chemotherapy.   Effects of chemotherapy on your sex life Some changes are simple and won't last long. They won't affect your sex life permanently. Sometimes you may feel: . too tired . not strong enough to be very active . sick or sore  . not in the mood . anxious or low Your anxiety might not seem related to sex. For example, you may be worried about the cancer and how your treatment is going. Or you may be worried about money, or about how you family are coping with your illness. These things can cause stress, which can affect your interest in sex. It's important to talk to your partner about how you feel. Remember - the changes to your sex life don't usually last long. There's usually no medical reason to stop having sex during chemo. The drugs won't have any long term physical effects on your performance or enjoyment of sex. Cancer can't be passed on to your partner during sex  Contraception It's important to use reliable contraception during treatment. Avoid getting pregnant while you or your partner are having chemotherapy. This is because the drugs may harm the baby. Sometimes chemotherapy drugs can leave a man or woman infertile.  This means you would not be able to have children in the future. You might want to talk to someone about  permanent  infertility. It can be very difficult to learn that you may no longer be able to have children. Some people find counselling helpful. There might be ways to preserve your fertility, although this is easier for men than for women. You may want to speak to a fertility expert. You can talk about sperm banking or harvesting your eggs. You can also ask about other fertility options, such as donor eggs. If you have or have had breast cancer, your doctor might advise you not to take the contraceptive pill. This is because the hormones in it might affect the cancer.  It is not known for sure whether or not chemotherapy drugs can be passed on through semen or secretions from the vagina. Because of this some doctors advise people to use a barrier method if you have sex during treatment. This applies to vaginal, anal or oral sex. Generally, doctors advise a barrier method only for the time you are actually having the treatment and for about a week after your treatment. Advice like this can be worrying, but this does not mean that you have to avoid being intimate with your partner. You can still have close contact with your partner and continue to enjoy sex.  Animals If you have cats or birds we just ask that you not change the litter or change the cage.  Please have someone else do this for you while you are on chemotherapy.   Food Safety During and After Cancer Treatment Food safety is important for people both during and after cancer treatment. Cancer and cancer treatments, such as chemotherapy, radiation therapy, and stem cell/bone marrow transplantation, often weaken the immune system. This makes it harder for your body to protect itself from foodborne illness, also called food poisoning. Foodborne illness is caused by eating food that contains harmful bacteria, parasites, or viruses.  Foods to avoid Some foods have a higher risk of becoming tainted with bacteria. These include: Marland Kitchen Unwashed fresh fruit and  vegetables, especially leafy vegetables that can hide dirt and other contaminants . Raw sprouts, such as alfalfa sprouts . Raw or undercooked beef, especially ground beef, or other raw or undercooked meat and poultry . Fatty, fried, or spicy foods immediately before or after treatment.  These can sit heavy on your stomach and make you feel nauseous. . Raw or undercooked shellfish, such as oysters. . Sushi and sashimi, which often contain raw fish.  . Unpasteurized beverages, such as unpasteurized fruit juices, raw milk, raw yogurt, or cider . Undercooked eggs, such as soft boiled, over easy, and poached; raw, unpasteurized eggs; or foods made with raw egg, such as homemade raw cookie dough and homemade mayonnaise Simple steps for food safety Shop smart. . Do not buy food stored or displayed in an unclean area. . Do not buy bruised or damaged fruits or vegetables. . Do not buy cans that have cracks, dents, or bulges. . Pick up foods that can spoil at the end of your shopping trip and store them in a cooler on the way home. Prepare and clean up foods carefully. . Rinse all fresh fruits and vegetables under running water, and dry them with a clean towel or paper towel. . Clean the top of cans before opening them. . After preparing food, wash your hands for 20 seconds with hot water and soap. Pay special attention to areas between fingers and under nails. . Clean your utensils and dishes with hot water and soap. Marland Kitchen Disinfect your kitchen and cutting  boards using 1 teaspoon of liquid, unscented bleach mixed into 1 quart of water.   Dispose of old food. . Eat canned and packaged food before its expiration date (the "use by" or "best before" date). . Consume refrigerated leftovers within 3 to 4 days. After that time, throw out the food. Even if the food does not smell or look spoiled, it still may be unsafe. Some bacteria, such as Listeria, can grow even on foods stored in the refrigerator if they are  kept for too long. Take precautions when eating out. . At restaurants, avoid buffets and salad bars where food sits out for a long time and comes in contact with many people. Food can become contaminated when someone with a virus, often a norovirus, or another "bug" handles it. . Put any leftover food in a "to-go" container yourself, rather than having the server do it. And, refrigerate leftovers as soon as you get home. . Choose restaurants that are clean and that are willing to prepare your food as you order it cooked.   MEDICATIONS:                                                                                                                                                                Compazine/Prochlorperazine 10mg  tablet. Take 1 tablet every 6 hours as needed for nausea/vomiting. (This can make you sleepy)   EMLA cream. Apply a quarter size amount to port site 1 hour prior to chemo. Do not rub in. Cover with plastic wrap.   Over-the-Counter Meds:  Colace - 100 mg capsules - take 2 capsules daily.  If this doesn't help then you can increase to 2 capsules twice daily.  Call us if this does not help your bowels move.   Imodium 2mg  capsule. Take 2 capsules after the 1st loose stool and then 1 capsule every 2 hours until you go a total of 12 hours without having a loose stool. Call the [facility] if loose stools continue. If diarrhea occurs at bedtime, take 2 capsules at bedtime. Then take 2 capsules every 4 hours until morning. Call [facility].    Diarrhea Sheet   If you are having loose stools/diarrhea, please purchase Imodium and begin taking as outlined:  At the first sign of poorly formed or loose stools you should begin taking Imodium (loperamide) 2 mg capsules.  Take two caplets (4mg ) followed by one caplet (2mg ) every 2 hours until you have had no diarrhea for 12 hours.  During the night take two caplets (4mg ) at bedtime and continue every 4 hours during the night until  the morning.  Stop taking Imodium only after there is no sign of diarrhea for 12 hours.    Always call the [facility] if you are having  loose stools/diarrhea that you can't get under control.  Loose stools/diarrhea leads to dehydration (loss of water) in your body.  We have other options of trying to get the loose stools/diarrhea to stop but you must let us know!   Constipation Sheet  Colace - 100 mg capsules - take 2 capsules daily.  If this doesn't help then you can increase to 2 capsules twice daily.  Please call if the above does not work for you.   Do not go more than 2 days without a bowel movement.  It is very important that you do not become constipated.  It will make you feel sick to your stomach (nausea) and can cause abdominal pain and vomiting.   Nausea Sheet   Compazine/Prochlorperazine 10mg  tablet. Take 1 tablet every 6 hours as needed for nausea/vomiting. (This can make you sleepy)  If you are having persistent nausea (nausea that does not stop) please call the [facility] and let us know the amount of nausea that you are experiencing.  If you begin to vomit, you need to call the [facility] and if it is the weekend and you have vomited more than one time and can't get it to stop-go to the Emergency Room.  Persistent nausea/vomiting can lead to dehydration (loss of fluid in your body) and will make you feel terrible.   Ice chips, sips of clear liquids, foods that are @ room temperature, crackers, and toast tend to be better tolerated.   SYMPTOMS TO REPORT AS SOON AS POSSIBLE AFTER TREATMENT:   FEVER GREATER THAN 100.5 F  CHILLS WITH OR WITHOUT FEVER  NAUSEA AND VOMITING THAT IS NOT CONTROLLED WITH YOUR NAUSEA MEDICATION  UNUSUAL SHORTNESS OF BREATH  UNUSUAL BRUISING OR BLEEDING  TENDERNESS IN MOUTH AND THROAT WITH OR WITHOUT PRESENCE OF ULCERS  URINARY PROBLEMS  BOWEL PROBLEMS  UNUSUAL RASH      Wear comfortable clothing and clothing appropriate  for easy access to any Portacath or PICC line. Let us know if there is anything that we can do to make your therapy better!    What to do if you need assistance after hours or on the weekends: CALL [phone].  HOLD on the line, do not hang up.  You will hear multiple messages but at the end you will be connected with a nurse triage line.  They will contact the doctor if necessary.  Most of the time they will be able to assist you.  Do not call the hospital operator.      I have been informed and understand all of the instructions given to me and have received a copy. I have been instructed to call the clinic [phone] or my family physician as soon as possible for continued medical care, if indicated. I do not have any more questions at this time but understand that I may call the [facility] or the Patient Navigator at [phone] during office hours should I have questions or need assistance in obtaining follow-up care.

## 2017-09-25 NOTE — Patient Instructions (Signed)
[facility] Discharge Instructions for Patients Receiving Chemotherapy  Today you received the following chemotherapy agents herceptin, and perjeta.    If you develop nausea and vomiting that is not controlled by your nausea medication, call the clinic.   BELOW ARE SYMPTOMS THAT SHOULD BE REPORTED IMMEDIATELY:  *FEVER GREATER THAN 100.5 F  *CHILLS WITH OR WITHOUT FEVER  NAUSEA AND VOMITING THAT IS NOT CONTROLLED WITH YOUR NAUSEA MEDICATION  *UNUSUAL SHORTNESS OF BREATH  *UNUSUAL BRUISING OR BLEEDING  TENDERNESS IN MOUTH AND THROAT WITH OR WITHOUT PRESENCE OF ULCERS  *URINARY PROBLEMS  *BOWEL PROBLEMS  UNUSUAL RASH Items with * indicate a potential emergency and should be followed up as soon as possible.  Feel free to call the clinic should you have any questions or concerns. The clinic phone number is [phone].  Please show the [facility] at check-in to the Emergency Department and triage nurse.

## 2017-09-25 NOTE — Telephone Encounter (Signed)
FAXED [facility]

## 2017-09-26 ENCOUNTER — Telehealth (HOSPITAL_COMMUNITY): Payer: Self-pay | Admitting: Internal Medicine

## 2017-09-26 LAB — CANCER ANTIGEN 15-3: CA 15-3: 22.1 U/mL (ref 0.0–25.0)

## 2017-09-26 LAB — CANCER ANTIGEN 27.29: CA 27.29: 32.2 U/mL (ref 0.0–38.6)

## 2017-09-26 NOTE — Telephone Encounter (Signed)
XELODA RX COULD NOT BE FILLED BY [facility] PER PTS INS. TRANSFERRED TO [facility] [phone]

## 2017-10-08 ENCOUNTER — Ambulatory Visit: Payer: Self-pay | Admitting: Family Medicine

## 2017-10-08 ENCOUNTER — Ambulatory Visit: Payer: PRIVATE HEALTH INSURANCE | Admitting: Physician Assistant

## 2017-10-08 ENCOUNTER — Encounter: Payer: Self-pay | Admitting: Family Medicine

## 2017-10-08 VITALS — BP 135/85 | HR 114 | Temp 98.0°F | Ht 64.0 in

## 2017-10-08 DIAGNOSIS — R399 Unspecified symptoms and signs involving the genitourinary system: Secondary | ICD-10-CM

## 2017-10-08 DIAGNOSIS — R112 Nausea with vomiting, unspecified: Secondary | ICD-10-CM

## 2017-10-08 DIAGNOSIS — R110 Nausea: Secondary | ICD-10-CM

## 2017-10-08 LAB — MICROSCOPIC EXAMINATION

## 2017-10-08 LAB — URINALYSIS, COMPLETE
Bilirubin, UA: POSITIVE — AB
GLUCOSE, UA: NEGATIVE
NITRITE UA: NEGATIVE
SPEC GRAV UA: 1.025 (ref 1.005–1.030)
Urobilinogen, Ur: 1 mg/dL (ref 0.2–1.0)
pH, UA: 5.5 (ref 5.0–7.5)

## 2017-10-08 LAB — PROTIME-INR: INR: 3.6 — AB (ref 0.9–1.1)

## 2017-10-08 MED ORDER — ONDANSETRON 4 MG PO TBDP
4.0000 mg | ORAL_TABLET | Freq: Once | ORAL | Status: AC
  Administered 2017-10-08: 8 mg via ORAL

## 2017-10-08 MED ORDER — CEFTRIAXONE SODIUM 1 G IJ SOLR
1.0000 g | Freq: Once | INTRAMUSCULAR | Status: AC
  Administered 2017-10-08: 1 g via INTRAMUSCULAR

## 2017-10-08 MED ORDER — ONDANSETRON 8 MG PO TBDP: 8.0000 mg | ORAL_TABLET | Freq: Once | ORAL | Status: AC

## 2017-10-08 MED ORDER — ONDANSETRON 8 MG PO TBDP: 8.0000 mg | ORAL_TABLET | Freq: Three times a day (TID) | ORAL | 0 refills | Status: AC | PRN

## 2017-10-08 MED ORDER — CIPROFLOXACIN HCL 500 MG PO TABS: 500.0000 mg | ORAL_TABLET | Freq: Two times a day (BID) | ORAL | 0 refills | Status: AC

## 2017-10-08 NOTE — Progress Notes (Signed)
Chief Complaint  Patient presents with  . Nausea    2 weeks  . No appetite  . Urine Odor  . Fatigue  . abnormal colored urine    HPI  Patient presents today for nausea and lower abdominal pain. Having strong odor. Some dysuria. About to start chemo for new findings on recent PET.Very weak. Can hardly stand up.  burning with urination and frequency for several days. Denies fever . No flank pain.   PMH: Smoking status noted ROS: Per HPI  Objective: BP 135/85   Pulse (!) 114   Temp 98 F (36.7 C) (Oral)   Ht 5\' 4"  (1.626 m)   LMP 03/08/2010   BMI 22.95 kg/m  Gen: NAD, alert, cooperative with exam HEENT: NCAT, EOMI, PERRL CV: RRR, good S1/S2, no murmur Resp: CTABL, no wheezes, non-labored Abd: SNTND, BS present, no guarding or organomegaly Neuro: Alert and oriented, No gross deficits  Assessment and plan:  1. UTI symptoms   2. Nausea   3. Nausea and vomiting, intractability of vomiting not specified, unspecified vomiting type     Meds ordered this encounter  Medications  . ondansetron (ZOFRAN-ODT) disintegrating tablet 8 mg  . cefTRIAXone (ROCEPHIN) injection 1 g  . ondansetron (ZOFRAN ODT) 8 MG disintegrating tablet    Sig: Take 1 tablet (8 mg total) by mouth 3 (three) times daily as needed for nausea or vomiting.    Dispense:  20 tablet    Refill:  0  . ciprofloxacin (CIPRO) 500 MG tablet    Sig: Take 1 tablet (500 mg total) by mouth 2 (two) times daily.    Dispense:  20 tablet    Refill:  0  . ondansetron (ZOFRAN-ODT) disintegrating tablet 4 mg    Orders Placed This Encounter  Procedures  . Urine Culture    Standing Status:   Future    Number of Occurrences:   1    Standing Expiration Date:   11/07/2017  . Microscopic Examination  . Urinalysis, Complete  . Protime-INR    This external order was created through the Results Console.    Follow up as needed.  Claretta Fraise, MD

## 2017-10-10 ENCOUNTER — Telehealth (HOSPITAL_COMMUNITY): Payer: Self-pay | Admitting: Internal Medicine

## 2017-10-10 LAB — URINE CULTURE

## 2017-10-10 NOTE — Telephone Encounter (Signed)
PC TO [facility] WITH [facility]. PER [facility] IS READY FOR DELIVERY BUT THEY CAN'T GET IN TOUCH WITH THE PT.

## 2017-10-11 ENCOUNTER — Encounter (HOSPITAL_COMMUNITY): Payer: Self-pay | Admitting: *Deleted

## 2017-10-11 NOTE — Progress Notes (Signed)
I have attempted to call all numbers listed as well as patient's emergency contact and unable to reach patient. I did leave a voicemail asking her to return our call.

## 2017-10-15 ENCOUNTER — Other Ambulatory Visit (HOSPITAL_COMMUNITY): Payer: Self-pay | Admitting: Nurse Practitioner

## 2017-10-16 ENCOUNTER — Ambulatory Visit (HOSPITAL_COMMUNITY): Payer: Managed Care, Other (non HMO) | Admitting: Internal Medicine

## 2017-10-16 ENCOUNTER — Inpatient Hospital Stay (HOSPITAL_COMMUNITY): Payer: Managed Care, Other (non HMO)

## 2017-10-16 ENCOUNTER — Ambulatory Visit (HOSPITAL_COMMUNITY): Payer: Managed Care, Other (non HMO)

## 2017-11-05 DEATH — deceased

## 2018-01-28 NOTE — Progress Notes (Signed)
This encounter was created in error - please disregard.
